# Patient Record
Sex: Female | Born: 1960 | Race: White | Hispanic: No | Smoking: Former smoker
Health system: Southern US, Community
[De-identification: ages and names within clinical notes are randomized; demographics above are authoritative.]

## PROBLEM LIST (undated history)

## (undated) DIAGNOSIS — I10 Essential (primary) hypertension: Secondary | ICD-10-CM

## (undated) DIAGNOSIS — C801 Malignant (primary) neoplasm, unspecified: Secondary | ICD-10-CM

## (undated) DIAGNOSIS — K589 Irritable bowel syndrome without diarrhea: Secondary | ICD-10-CM

## (undated) DIAGNOSIS — B1920 Unspecified viral hepatitis C without hepatic coma: Secondary | ICD-10-CM

## (undated) DIAGNOSIS — F419 Anxiety disorder, unspecified: Secondary | ICD-10-CM

## (undated) DIAGNOSIS — B1910 Unspecified viral hepatitis B without hepatic coma: Secondary | ICD-10-CM

## (undated) HISTORY — DX: Essential (primary) hypertension: I10

## (undated) HISTORY — PX: PLACEMENT OF BREAST IMPLANTS: SHX6334

## (undated) HISTORY — DX: Irritable bowel syndrome, unspecified: K58.9

## (undated) HISTORY — DX: Unspecified viral hepatitis B without hepatic coma: B19.10

## (undated) HISTORY — DX: Unspecified viral hepatitis C without hepatic coma: B19.20

## (undated) HISTORY — DX: Anxiety disorder, unspecified: F41.9

---

## 1999-03-18 ENCOUNTER — Other Ambulatory Visit: Admission: RE | Admit: 1999-03-18 | Discharge: 1999-03-18 | Payer: Self-pay | Admitting: *Deleted

## 2001-11-29 ENCOUNTER — Inpatient Hospital Stay (HOSPITAL_COMMUNITY): Admission: AD | Admit: 2001-11-29 | Discharge: 2001-11-29 | Payer: Self-pay | Admitting: Family Medicine

## 2001-11-30 ENCOUNTER — Encounter: Payer: Self-pay | Admitting: *Deleted

## 2007-01-04 HISTORY — PX: NISSEN FUNDOPLICATION: SHX2091

## 2011-01-04 HISTORY — PX: GASTRIC BYPASS: SHX52

## 2013-10-13 ENCOUNTER — Telehealth: Payer: Self-pay

## 2013-10-13 NOTE — Telephone Encounter (Signed)
PATIENT WOULD LIKE TO TALK WITH DR. Joseph Art AS SOON AS POSSIBLE. SHE HAS NOT SEEN HIM AS A PATIENT AT [facility] BEFORE, BUT SHE HAS BEEN KNOWING HIM FOR MANY MANY YEARS. HER MOTHER (THELMA Porto) IS HIS PATIENT. Trenity'S MOTHER HAD A RUPTURED COLON AND HAD TO HAVE EMERGENCY SURGERY YESTERDAY. THE PHYSICIANS DO NOT THINK SHE WILL MAKE IT MUCH LONGER. Candid IS ASKING DR. Joseph Art TO GIVE HER SOMETHING FOR HER NERVES TO CALM HER DOWN. SHE NEEDS TO TALK WITH HIM AS SOON AS POSSIBLE. SHE IS ON HER WAY TO [place] TO SEE HER MOTHER. PLEASE CALL HER BACK QUICKLY. BEST PHONE [phone] (CELL)  PHARMACY CHOICE IS [facility] [address].  [place]

## 2013-10-14 ENCOUNTER — Telehealth: Payer: Self-pay | Admitting: *Deleted

## 2013-10-14 NOTE — Telephone Encounter (Signed)
Pt mother Shaquitta Burbridge) passed away this morning. Pt will not have time to come into the office for an OV- she is asking for Klonopin to be sent to her pharmacy. She is under a huge amount of stress and feeling anxious and depressed due to the recent loss of her mother. Can this be called in for her until she is able to come to the office.

## 2013-10-14 NOTE — Telephone Encounter (Signed)
The cell phone number left is no longer in service.  If patient wants medication, she should be seen here.

## 2013-10-14 NOTE — Telephone Encounter (Signed)
Lm for pt to come in to be evaluated.

## 2013-10-14 NOTE — Telephone Encounter (Signed)
i can't prescribe a controlled substance for someone never seen here--and I didn't see her mom recently i'll forward back to dr L in case there are extenuating circumstances

## 2017-06-03 HISTORY — PX: ORIF ANKLE FRACTURE: SUR919

## 2018-06-01 ENCOUNTER — Ambulatory Visit: Payer: Self-pay | Attending: Internal Medicine | Admitting: Internal Medicine

## 2018-06-01 ENCOUNTER — Other Ambulatory Visit: Payer: Self-pay

## 2018-06-01 ENCOUNTER — Encounter: Payer: Self-pay | Admitting: Internal Medicine

## 2018-06-01 VITALS — BP 146/76 | HR 96 | Temp 98.3°F | Resp 16 | Ht 68.5 in | Wt 221.8 lb

## 2018-06-01 DIAGNOSIS — I10 Essential (primary) hypertension: Secondary | ICD-10-CM

## 2018-06-01 DIAGNOSIS — R079 Chest pain, unspecified: Secondary | ICD-10-CM

## 2018-06-01 DIAGNOSIS — R002 Palpitations: Secondary | ICD-10-CM | POA: Insufficient documentation

## 2018-06-01 DIAGNOSIS — G4700 Insomnia, unspecified: Secondary | ICD-10-CM

## 2018-06-01 DIAGNOSIS — F419 Anxiety disorder, unspecified: Secondary | ICD-10-CM | POA: Insufficient documentation

## 2018-06-01 DIAGNOSIS — K580 Irritable bowel syndrome with diarrhea: Secondary | ICD-10-CM | POA: Insufficient documentation

## 2018-06-01 DIAGNOSIS — K22719 Barrett's esophagus with dysplasia, unspecified: Secondary | ICD-10-CM

## 2018-06-01 MED ORDER — CLONAZEPAM 0.5 MG PO TABS: 0.5000 mg | ORAL_TABLET | Freq: Every day | ORAL | 0 refills | Status: DC | PRN

## 2018-06-01 MED ORDER — LOSARTAN POTASSIUM 25 MG PO TABS: 12.5000 mg | ORAL_TABLET | Freq: Every day | ORAL | 2 refills | Status: DC

## 2018-06-01 MED ORDER — HYDROCHLOROTHIAZIDE 12.5 MG PO TABS: 12.5000 mg | ORAL_TABLET | Freq: Every day | ORAL | 3 refills | Status: DC

## 2018-06-01 MED ORDER — OMEPRAZOLE 20 MG PO CPDR: 20.0000 mg | DELAYED_RELEASE_CAPSULE | Freq: Every day | ORAL | 3 refills | Status: DC

## 2018-06-01 MED FILL — ?HYDROCHLOROTHIAZIDE 25MG T: 25 | 30 days supply | Qty: 15 | Fill #0

## 2018-06-01 MED FILL — LOSARTAN POTASSIUM 25 MG TA: 25 | 30 days supply | Qty: 15 | Fill #0

## 2018-06-01 MED FILL — ?OMEPRAZOLE 20 MG CAPSULE D: 20 | 30 days supply | Qty: 30 | Fill #0

## 2018-06-01 NOTE — Progress Notes (Signed)
Pt states she has not been able to afford medications  Pt states it has been almost 9 month since she has taken these medications   Pt states she had an ablation 15 years ago   Pt states she doesn't know if she has been through menopause

## 2018-06-01 NOTE — Progress Notes (Signed)
Patient ID: Kelly Roberson, female    DOB: [birth year]  MRN: [id number]  CC: Edema (left ankle ) and New Patient (Initial Visit)   Subjective: Kelly Roberson is a 58 y.o. female who presents for new pt visit Her concerns today include:   Previous PCP was Dr. Fonnie Jarvis in [place]  Loss job and insurance in June 2019.   Moved back to [place] 03/2018.   PMHx include HTN, IBS-D, gastric ulcer, Barrett's Esophagus, hiatial hernia s/p Nissen (2009.  Reversed in 2013)  Gastric bypass in 2013, anxiety disorder, insomnia (reports being placed on Tramadol by GI to help her sleep).  Out of meds for 9-12 mths.   Checks BP 2 x a day.  Gives range 140-175/90s Limits salt in foods +edema LE x few wks.  Usually LT leg but lately in RT also.  Some redness in LT leg.  Hx of fx Lt ankle with repair, hardware in place 06/2017.  Patient reports that the left lower extremity has always been slightly larger than the right. Intermittent feeling of heart fluttering x 1 yr.  More often and intense lately.  Last few seconds and occurs 2-3 x/day -endorses CP recently.  1-2 episodes/day.  Last few mins.  Tries to calm down with meditation.  Located center of chest.  No radiation or SOB.  Usually occur at rest.  Nonsmoker.  No fhx of heart disease.  ST many yrs ago which was negative.    Anxiety:  Still an ongoing issue for her with COVID-19 going on.  She was on clonazepam in the past   Last MMG: late 2018, pap early 2018, c-scope 2018.  Past medical history, social history, family history, surgical history reviewed  Current Outpatient Medications on File Prior to Visit  Medication Sig Dispense Refill  . traMADol (ULTRAM) 50 MG tablet Take by mouth every 6 (six) hours as needed.     No current facility-administered medications on file prior to visit.     Allergies  Allergen Reactions  . Lisinopril Cough    Social History   Socioeconomic History  . Marital status: Married    Spouse name: Not on file   . Number of children: Not on file  . Years of education: Not on file  . Highest education level: Not on file  Occupational History  . Not on file  Social Needs  . Financial resource strain: Not on file  . Food insecurity:    Worry: Not on file    Inability: Not on file  . Transportation needs:    Medical: Not on file    Non-medical: Not on file  Tobacco Use  . Smoking status: Former Research scientist (life sciences)  . Smokeless tobacco: Never Used  Substance and Sexual Activity  . Alcohol use: Yes    Comment: occasionally   . Drug use: Never  . Sexual activity: Not on file  Lifestyle  . Physical activity:    Days per week: Not on file    Minutes per session: Not on file  . Stress: Not on file  Relationships  . Social connections:    Talks on phone: Not on file    Gets together: Not on file    Attends religious service: Not on file    Active member of club or organization: Not on file    Attends meetings of clubs or organizations: Not on file    Relationship status: Not on file  . Intimate partner violence:    Fear of current or  ex partner: Not on file    Emotionally abused: Not on file    Physically abused: Not on file    Forced sexual activity: Not on file  Other Topics Concern  . Not on file  Social History Narrative  . Not on file    Family History  Problem Relation Age of Onset  . Pancreatic cancer Father   . Colon cancer Maternal Aunt   . Diabetes Maternal Uncle   . Pancreatic cancer Maternal Grandfather     Past Surgical History:  Procedure Laterality Date  . GASTRIC BYPASS  2013  . NISSEN FUNDOPLICATION  7893   Reversed in 2013  . ORIF ANKLE FRACTURE Left 06/2017  . PLACEMENT OF BREAST IMPLANTS      ROS: Review of Systems Negative except as stated above  PHYSICAL EXAM: BP (!) 146/76   Pulse 96   Temp 98.3 F (36.8 C) (Oral)   Resp 16   Ht 5' 8.5" (1.74 m)   Wt 221 lb 12.8 oz (100.6 kg)   SpO2 98%   BMI 33.23 kg/m   Physical Exam  General appearance -  alert, well appearing, middle-aged Caucasian female and in no distress Mental status - normal mood, behavior, speech, dress, motor activity, and thought processes Eyes -nonicteric sclera.  Pink conjunctiva.   Nose - normal and patent, no erythema, discharge or polyps Mouth - mucous membranes moist, pharynx normal without lesions Neck - supple, no significant adenopathy Chest - clear to auscultation, no wheezes, rales or rhonchi, symmetric air entry Heart - normal rate, regular rhythm, normal S1, S2, no murmurs, rubs, clicks or gallops Abdomen - soft, nontender, nondistended, no masses or organomegaly Extremities -1+ lower extremity and ankle edema on the left, trace on the right.   Depression screen [facility] 2/9 06/01/2018  Decreased Interest 0  Down, Depressed, Hopeless 0  PHQ - 2 Score 0   GAD 7 : Generalized Anxiety Score 06/01/2018  Nervous, Anxious, on Edge 2  Control/stop worrying 1  Worry too much - different things 0  Trouble relaxing 1  Restless 0  Easily annoyed or irritable 0  Afraid - awful might happen 0  Total GAD 7 Score 4    ASSESSMENT AND PLAN: 1. Essential hypertension Not at goal. Encourage to continue limit salt in the foods. We will put her on low-dose of Cozaar with HCTZ. - Comprehensive metabolic panel; Future - Lipid panel; Future - CBC; Future  2. Palpitations Recommended getting an EKG and TSH.  However patient declines because she does not want to be billed.  She states that she would wait until she has been approved for the orange card/cone discount.  If TSH is okay plan in the future would be to have her wear a Holter monitor - TSH; Future  3. Chest pain at rest Recommended that we do an EKG today.  However patient declined for reasons stated in #2  4. Anxiety disorder, unspecified type Discussed management of anxiety.  Informed patient that I do not use benzodiazepines as a long-term treatment for anxiety due to its potential for dependence and  abuse.  I may choose to use clonazepam as a bridge when I am putting someone on an SSRI.  Normally use an SSRI like Zoloft or Lexapro or we can use BuSpar.  Patient states she is heard that Zoloft is an antidepressant.  I told her that it is indicated to treat anxiety and or depression.  Patient requested limited prescription for clonazepam and states  she will think about the SSRI.  I have given a prescription for 14 tablets of 0.5 mg of clonazepam.  Patient understands that I will not refill is a long-term prescription -[facility] reviewed  5. Insomnia, unspecified type Told patient that I do not use tramadol as a sleep aid.  I discussed good sleep hygiene with her including trying to get in bed around about the same time every night, turning off all lights and sounds once in bed, avoid drinking caffeinated beverages and excessive alcohol before bedtime.  If unable to fall asleep after being in bed for 45 minutes, I advised that she get up and do something like read until she feels sleepy  6. Irritable bowel syndrome with diarrhea 7. Barrett's esophagus with dysplasia  Patient to sign a release for me to get her records from her previous PCP. I spent 30 minutes with this patient in direct face-to-face encounter discussing diagnosis, treatment and coordinating care. Patient was given the opportunity to ask questions.  Patient verbalized understanding of the plan and was able to repeat key elements of the plan.   Orders Placed This Encounter  Procedures  . TSH  . Comprehensive metabolic panel  . Lipid panel  . CBC     Requested Prescriptions   Signed Prescriptions Disp Refills  . losartan (COZAAR) 25 MG tablet 30 tablet 2    Sig: Take 0.5 tablets (12.5 mg total) by mouth daily.  . hydrochlorothiazide (HYDRODIURIL) 12.5 MG tablet 30 tablet 3    Sig: Take 1 tablet (12.5 mg total) by mouth daily.  . clonazePAM (KLONOPIN) 0.5 MG tablet 14 tablet 0    Sig: Take 1 tablet (0.5 mg total) by mouth  daily as needed for anxiety.  Marland Kitchen omeprazole (PRILOSEC) 20 MG capsule 30 capsule 3    Sig: Take 1 capsule (20 mg total) by mouth daily.    Return in about 6 weeks (around 07/13/2018).  Karle Plumber, MD, FACP

## 2018-06-01 NOTE — Patient Instructions (Signed)
Please sign a release for [place] to get your medical records from your previous PCP  We have started you on Cozaar and hydrochlorothiazide for your blood pressure.  Once you have been approved for the orange card you can return to the lab to have your blood test done.   I have placed you on a limited prescription of clonazepam.  We will not maintain this on a long-term basis.

## 2018-06-25 MED FILL — ?HYDROCHLOROTHIAZIDE 25MG T: 25 | 30 days supply | Qty: 15 | Fill #1

## 2018-06-25 MED FILL — LOSARTAN POTASSIUM 25 MG TA: 25 | 30 days supply | Qty: 15 | Fill #1

## 2018-06-26 MED FILL — ?OMEPRAZOLE 20 MG CAPSULE D: 20 | 30 days supply | Qty: 30 | Fill #1

## 2018-07-27 MED FILL — ?HYDROCHLOROTHIAZIDE 25MG T: 25 | 30 days supply | Qty: 15 | Fill #2

## 2018-07-27 MED FILL — ?OMEPRAZOLE 20 MG CAPSULE D: 20 | 30 days supply | Qty: 30 | Fill #2

## 2018-07-27 MED FILL — LOSARTAN POTASSIUM 25 MG TA: 25 | 30 days supply | Qty: 15 | Fill #2

## 2018-09-04 MED FILL — LOSARTAN POTASSIUM 25 MG TA: 25 | 30 days supply | Qty: 15 | Fill #3

## 2018-09-04 MED FILL — ?HYDROCHLOROTHIAZIDE 25MG T: 25 | 30 days supply | Qty: 15 | Fill #3

## 2018-09-04 MED FILL — OMEPRAZOLE 20 MG CAP: 20 | 30 days supply | Qty: 30 | Fill #3

## 2018-09-26 ENCOUNTER — Telehealth: Payer: Self-pay | Admitting: Internal Medicine

## 2018-09-26 ENCOUNTER — Other Ambulatory Visit: Payer: Self-pay | Admitting: Internal Medicine

## 2018-09-26 NOTE — Telephone Encounter (Signed)
patiennt called requesting her medication dosage be increased for the hydrochlorothiazide. States she now has only 1 and a half pill left and pharmacy will not give her an additional fill until monday. Please follow up.

## 2018-09-26 NOTE — Telephone Encounter (Signed)
Patient was suppose to return in July for follow up... Patient needs to be scheduled for a visit to discuss this change and refills

## 2018-09-27 ENCOUNTER — Other Ambulatory Visit: Payer: Self-pay | Admitting: Internal Medicine

## 2018-09-27 MED FILL — LOSARTAN POTASSIUM 25 MG TA: 25 | 30 days supply | Qty: 15 | Fill #4

## 2018-09-28 MED FILL — ?OMEPRAZOLE 20MG CAP DR: 20 | 30 days supply | Qty: 30 | Fill #0

## 2018-10-12 MED FILL — ?HYDROCHLOROTHIAZIDE 25MG T: 25 | 30 days supply | Qty: 15 | Fill #4

## 2018-10-30 MED FILL — LOSARTAN POTASSIUM 25 MG TA: 25 | 30 days supply | Qty: 15 | Fill #5

## 2018-11-26 ENCOUNTER — Other Ambulatory Visit: Payer: Self-pay | Admitting: Internal Medicine

## 2018-11-28 ENCOUNTER — Other Ambulatory Visit: Payer: Self-pay | Admitting: Pharmacist

## 2018-11-28 MED ORDER — LOSARTAN POTASSIUM 25 MG PO TABS: 12.5000 mg | ORAL_TABLET | Freq: Every day | ORAL | 0 refills | Status: DC

## 2018-11-28 MED ORDER — HYDROCHLOROTHIAZIDE 12.5 MG PO TABS: 12.5000 mg | ORAL_TABLET | Freq: Every day | ORAL | 0 refills | Status: DC

## 2018-12-05 MED FILL — ?HYDROCHLOROTHIAZIDE 12.5M: 12.5 | 20 days supply | Qty: 20 | Fill #0

## 2018-12-17 ENCOUNTER — Ambulatory Visit: Payer: Medicaid Other | Attending: Internal Medicine | Admitting: Internal Medicine

## 2018-12-17 ENCOUNTER — Encounter: Payer: Self-pay | Admitting: Internal Medicine

## 2018-12-17 ENCOUNTER — Other Ambulatory Visit: Payer: Self-pay

## 2018-12-17 DIAGNOSIS — M6281 Muscle weakness (generalized): Secondary | ICD-10-CM | POA: Diagnosis not present

## 2018-12-17 DIAGNOSIS — R202 Paresthesia of skin: Secondary | ICD-10-CM

## 2018-12-17 DIAGNOSIS — R252 Cramp and spasm: Secondary | ICD-10-CM | POA: Diagnosis not present

## 2018-12-17 DIAGNOSIS — I10 Essential (primary) hypertension: Secondary | ICD-10-CM | POA: Diagnosis not present

## 2018-12-17 MED ORDER — LOSARTAN POTASSIUM 25 MG PO TABS: 25.0000 mg | ORAL_TABLET | Freq: Every day | ORAL | 1 refills | Status: DC

## 2018-12-17 MED ORDER — HYDROCHLOROTHIAZIDE 12.5 MG PO TABS: 12.5000 mg | ORAL_TABLET | Freq: Every day | ORAL | 1 refills | Status: DC

## 2018-12-17 MED FILL — LOSARTAN POTASSIUM 25 MG TA: 25 | 30 days supply | Qty: 30 | Fill #0

## 2018-12-17 NOTE — Progress Notes (Signed)
Patient verified DOB Patient has not eaten today. Patient has taken medication. Patient denies pain at this time. Patient complains of pins and needle feeling intermittently in her feet for the past year after breaking the ankle. Patient is stating losartan should be 25mg . Her BP is still elevated with the half dose.

## 2018-12-17 NOTE — Progress Notes (Signed)
Virtual Visit via Telephone Note Due to current restrictions/limitations of in-office visits due to the COVID-19 pandemic, this scheduled clinical appointment was converted to a telehealth visit  I connected with Kelly Roberson on 12/17/18 at 9:54 a.m by telephone and verified that I am speaking with the correct person using two identifiers. I am in my office.  The patient is at home.  Only the patient and myself participated in this encounter.  I discussed the limitations, risks, security and privacy concerns of performing an evaluation and management service by telephone and the availability of in person appointments. I also discussed with the patient that there may be a patient responsible charge related to this service. The patient expressed understanding and agreed to proceed.   History of Present Illness: Patient with history of HTN, anxiety disorder, insomnia, IBS, Barrett's esophagus,  HYPERTENSION Currently taking: see medication list Med Adherence: [x]  Yes    []  No Medication side effects: []  Yes    [x]  No Adherence with salt restriction: [x]  Yes    []  No Home Monitoring?: [x]  Yes    []  No Monitoring Frequency: once a day Home BP results range: 140-149/80-92 SOB? []  Yes    [x]  No Chest Pain?: []  Yes    [x]  No Leg swelling?: [x]  Yes in legs and feet.  Wears compression socks    []  No Headaches?: []  Yes    [x]  No Dizziness? []  Yes    []  No Comments:   Did not get labs done that were ordered on last visit due to limited finances.  Last visit was $500.  She has request forms for [facility] discount C/O painful pins and needles feeling in feet especially when she stands too long.  X >1 yr.  Getting worse Muscle weakness all over the body "at different times." + Muscle spasms in calfs, thighs, arms and abdomen.  Some twitching in arms.  Balance feels off at times.  Fearful of another fracture.   Outpatient Encounter Medications as of 12/17/2018  Medication Sig  . hydrochlorothiazide  (HYDRODIURIL) 12.5 MG tablet Take 1 tablet (12.5 mg total) by mouth daily. Must keep upcoming appt for more refills.  Marland Kitchen losartan (COZAAR) 25 MG tablet Take 0.5 tablets (12.5 mg total) by mouth daily. Must keep upcoming appt for more refills.  Marland Kitchen omeprazole (PRILOSEC) 20 MG capsule Take 1 capsule (20 mg total) by mouth daily. Must have office visit for refills  . clonazePAM (KLONOPIN) 0.5 MG tablet Take 1 tablet (0.5 mg total) by mouth daily as needed for anxiety. (Patient not taking: Reported on 12/17/2018)   No facility-administered encounter medications on file as of 12/17/2018.      Observations/Objective: No direct observation done as this was a telephone encounter.  Assessment and Plan: 1. Essential hypertension Not at goal of 130/80 or lower.  Increase Cozaar to 25 mg daily.  It would have been good for [place] to increase HCTZ instead given her lower extremity edema but I would like to see what her kidney function looks like and her electrolytes given her complaint of muscle cramps - losartan (COZAAR) 25 MG tablet; Take 1 tablet (25 mg total) by mouth daily. Must keep upcoming appt for more refills.  Dispense: 90 tablet; Refill: 1 - hydrochlorothiazide (HYDRODIURIL) 12.5 MG tablet; Take 1 tablet (12.5 mg total) by mouth daily. Must keep upcoming appt for more refills.  Dispense: 90 tablet; Refill: 1 - CBC; Future - Comprehensive metabolic panel; Future - Lipid panel; Future  2. Tingling of both  feet We will do an in person visit in several weeks - Vitamin B12; Future  3. Muscle cramps Check chemistry to make sure electrolytes are okay  4. Muscle weakness We will do an in person visit in several weeks - VITAMIN D 25 Hydroxy (Vit-D Deficiency, Fractures); Future  HM: Patient has not made up her mind about getting the flu shot.  If she decides to have that she will get it done when she comes to have her blood test.  Follow Up Instructions: 3-6 wks   I discussed the assessment and  treatment plan with the patient. The patient was provided an opportunity to ask questions and all were answered. The patient agreed with the plan and demonstrated an understanding of the instructions.   The patient was advised to call back or seek an in-person evaluation if the symptoms worsen or if the condition fails to improve as anticipated.  I provided 15 minutes of non-face-to-face time during this encounter.   Karle Plumber, MD

## 2018-12-18 ENCOUNTER — Other Ambulatory Visit: Payer: Self-pay | Admitting: Internal Medicine

## 2018-12-18 ENCOUNTER — Other Ambulatory Visit: Payer: Self-pay | Admitting: Pharmacist

## 2018-12-18 MED ORDER — OMEPRAZOLE 20 MG PO CPDR: 20.0000 mg | DELAYED_RELEASE_CAPSULE | Freq: Every day | ORAL | 2 refills | Status: DC

## 2018-12-18 MED FILL — HYDROCHLOROTHIAZIDE 12.5 MG: 12.5 | 30 days supply | Qty: 30 | Fill #0

## 2018-12-18 MED FILL — ?OMEPRazole 20mg CPDR: 20 | 30 days supply | Qty: 30 | Fill #0

## 2018-12-20 ENCOUNTER — Other Ambulatory Visit: Payer: Medicaid Other

## 2019-01-17 ENCOUNTER — Other Ambulatory Visit: Payer: Self-pay

## 2019-01-17 ENCOUNTER — Ambulatory Visit: Payer: Self-pay | Attending: Internal Medicine

## 2019-01-17 DIAGNOSIS — I10 Essential (primary) hypertension: Secondary | ICD-10-CM

## 2019-01-17 DIAGNOSIS — M6281 Muscle weakness (generalized): Secondary | ICD-10-CM

## 2019-01-17 DIAGNOSIS — R202 Paresthesia of skin: Secondary | ICD-10-CM

## 2019-01-18 ENCOUNTER — Encounter: Payer: Self-pay | Admitting: Internal Medicine

## 2019-01-18 ENCOUNTER — Ambulatory Visit: Payer: Medicaid Other | Attending: Internal Medicine | Admitting: Internal Medicine

## 2019-01-18 VITALS — BP 144/88 | HR 82 | Temp 98.4°F | Resp 16 | Wt 210.4 lb

## 2019-01-18 DIAGNOSIS — L659 Nonscarring hair loss, unspecified: Secondary | ICD-10-CM | POA: Diagnosis not present

## 2019-01-18 DIAGNOSIS — R5383 Other fatigue: Secondary | ICD-10-CM

## 2019-01-18 DIAGNOSIS — E782 Mixed hyperlipidemia: Secondary | ICD-10-CM | POA: Insufficient documentation

## 2019-01-18 DIAGNOSIS — G6289 Other specified polyneuropathies: Secondary | ICD-10-CM | POA: Diagnosis not present

## 2019-01-18 DIAGNOSIS — R7989 Other specified abnormal findings of blood chemistry: Secondary | ICD-10-CM

## 2019-01-18 DIAGNOSIS — M6281 Muscle weakness (generalized): Secondary | ICD-10-CM

## 2019-01-18 DIAGNOSIS — R945 Abnormal results of liver function studies: Secondary | ICD-10-CM

## 2019-01-18 DIAGNOSIS — L853 Xerosis cutis: Secondary | ICD-10-CM

## 2019-01-18 DIAGNOSIS — R739 Hyperglycemia, unspecified: Secondary | ICD-10-CM

## 2019-01-18 DIAGNOSIS — L210 Seborrhea capitis: Secondary | ICD-10-CM

## 2019-01-18 DIAGNOSIS — G629 Polyneuropathy, unspecified: Secondary | ICD-10-CM | POA: Insufficient documentation

## 2019-01-18 LAB — LIPID PANEL
Chol/HDL Ratio: 3.9 ratio (ref 0.0–4.4)
Cholesterol, Total: 232 mg/dL — ABNORMAL HIGH (ref 100–199)
HDL: 60 mg/dL (ref >39)
LDL Chol Calc (NIH): 150 mg/dL — ABNORMAL HIGH (ref 0–99)
Triglycerides: 123 mg/dL (ref 0–149)
VLDL Cholesterol Cal: 22 mg/dL (ref 5–40)

## 2019-01-18 LAB — CBC
Hematocrit: 37.1 % (ref 34.0–46.6)
Hemoglobin: 12.8 g/dL (ref 11.1–15.9)
MCH: 36 pg — ABNORMAL HIGH (ref 26.6–33.0)
MCHC: 34.5 g/dL (ref 31.5–35.7)
MCV: 104 fL — ABNORMAL HIGH (ref 79–97)
Platelets: 134 10*3/uL — ABNORMAL LOW (ref 150–450)
RBC: 3.56 x10E6/uL — ABNORMAL LOW (ref 3.77–5.28)
RDW: 14 % (ref 11.7–15.4)
WBC: 3.8 10*3/uL (ref 3.4–10.8)

## 2019-01-18 LAB — COMPREHENSIVE METABOLIC PANEL
ALT: 92 IU/L — ABNORMAL HIGH (ref 0–32)
AST: 223 IU/L — ABNORMAL HIGH (ref 0–40)
Albumin/Globulin Ratio: 1.5 (ref 1.2–2.2)
Albumin: 4.1 g/dL (ref 3.8–4.9)
Alkaline Phosphatase: 118 IU/L — ABNORMAL HIGH (ref 39–117)
BUN/Creatinine Ratio: 11 — ABNORMAL LOW (ref 12–28)
BUN: 8 mg/dL (ref 8–27)
Bilirubin Total: 1.1 mg/dL (ref 0.0–1.2)
CO2: 20 mmol/L (ref 20–29)
Calcium: 9.6 mg/dL (ref 8.7–10.3)
Chloride: 102 mmol/L (ref 96–106)
Creatinine, Ser: 0.74 mg/dL (ref 0.57–1.00)
GFR calc Af Amer: 102 mL/min/{1.73_m2} (ref >59)
GFR calc non Af Amer: 88 mL/min/{1.73_m2} (ref >59)
Globulin, Total: 2.8 g/dL (ref 1.5–4.5)
Glucose: 127 mg/dL — ABNORMAL HIGH (ref 65–99)
Potassium: 4.4 mmol/L (ref 3.5–5.2)
Sodium: 139 mmol/L (ref 134–144)
Total Protein: 6.9 g/dL (ref 6.0–8.5)

## 2019-01-18 LAB — VITAMIN D 25 HYDROXY (VIT D DEFICIENCY, FRACTURES): Vit D, 25-Hydroxy: 37.6 ng/mL (ref 30.0–100.0)

## 2019-01-18 LAB — VITAMIN B12: Vitamin B-12: 542 pg/mL (ref 232–1245)

## 2019-01-18 NOTE — Patient Instructions (Signed)
We will plan to do additional blood test today to evaluate your symptoms.  Once you have been approved for the orange card/cone discount card, I plan to refer you to a neurologist.  Try to use some over-the-counter dandruff shampoo like selenium blue or T-Gel on your scalp.  We can also plan to refer you for some physical therapy once you have been approved for the orange card/cone discount to help with gait safety and prevent falls.

## 2019-01-18 NOTE — Progress Notes (Signed)
Patient ID: Kelly Roberson, female    DOB: Sep 22, 1960  MRN: [id number]  CC: Follow-up (6 week ) and Fatigue   Subjective: Kelly Roberson is a 59 y.o. female who presents for chronic ds management Her concerns today include:  PMHx include HTN, IBS-D, gastric ulcer, Barrett's Esophagus, hiatial hernia s/p Nissen (2009.  Reversed in 2013)  Gastric bypass in 2013, anxiety disorder, insomnia (reports being placed on Tramadol by GI to help her sleep).  I went over lab results which she had done yesterday Vit D and Vit B12 level were nl. Pt states she takes Vitamins and Vit D but out x 3 wks Total and LDL cholesterol not at goal. LFTs shows elev AST/ALT and Alk phos.  Reports being told in past that she has hep C ab positive.  Had subsequent work up by [facility] almost 30 yrs ago.  Told she may had infection in past but cleared he virus. High school boyfriend developed hep C later in life and died from it.   Drinks ETOH very seldom.  CBC reveals low PLT count.  Told had fatty liver in 2013/14.    Working on getting approved for [employer].    -C/o very extremely fatigue -Still has numbness, tingling and pain in feet.  Having problems walking a while. Worse when it is cold.  Numbness also in legs from below knee down.  Soreness in muscles of thigh.  No back pain.   -[place] out of chair 3 wks ago.  She was trying to get up out of chair and her legs gave out -Urine was very orange for 3 days around christmas.  Associated with pain above pubic bone.   -c/o losing hair for 6-8 mths.  "its braking." -having intermittent blurry vision changes -has a spot on nose that came up 3-4 mths.  It has increased in size.  Thinks it may be skin cancer.  . Patient Active Problem List   Diagnosis Date Noted  . Mixed hyperlipidemia 01/18/2019  . Abnormal LFTs 01/18/2019  . Dandruff in adult 01/18/2019  . Hair loss 01/18/2019  . Muscle weakness 01/18/2019  . Peripheral neuropathy 01/18/2019  . Other  fatigue 01/18/2019  . Essential hypertension 06/01/2018  . Palpitations 06/01/2018  . Anxiety disorder 06/01/2018  . Insomnia 06/01/2018  . Irritable bowel syndrome with diarrhea 06/01/2018  . Barrett's esophagus with dysplasia 06/01/2018     Current Outpatient Medications on File Prior to Visit  Medication Sig Dispense Refill  . hydrochlorothiazide (HYDRODIURIL) 12.5 MG tablet Take 1 tablet (12.5 mg total) by mouth daily. Must keep upcoming appt for more refills. 90 tablet 1  . losartan (COZAAR) 25 MG tablet Take 1 tablet (25 mg total) by mouth daily. Must keep upcoming appt for more refills. 90 tablet 1  . omeprazole (PRILOSEC) 20 MG capsule Take 1 capsule (20 mg total) by mouth daily. 30 capsule 2   No current facility-administered medications on file prior to visit.    Allergies  Allergen Reactions  . Lisinopril Cough    Social History   Socioeconomic History  . Marital status: Married    Spouse name: Not on file  . Number of children: Not on file  . Years of education: Not on file  . Highest education level: Not on file  Occupational History  . Not on file  Tobacco Use  . Smoking status: Former Research scientist (life sciences)  . Smokeless tobacco: Never Used  Substance and Sexual Activity  . Alcohol use: Yes  Comment: occasionally   . Drug use: Never  . Sexual activity: Not Currently  Other Topics Concern  . Not on file  Social History Narrative  . Not on file   Social Determinants of Health   Financial Resource Strain:   . Difficulty of Paying Living Expenses: Not on file  Food Insecurity:   . Worried About Charity fundraiser in the Last Year: Not on file  . Ran Out of Food in the Last Year: Not on file  Transportation Needs:   . Lack of Transportation (Medical): Not on file  . Lack of Transportation (Non-Medical): Not on file  Physical Activity:   . Days of Exercise per Week: Not on file  . Minutes of Exercise per Session: Not on file  Stress:   . Feeling of Stress : Not  on file  Social Connections:   . Frequency of Communication with Friends and Family: Not on file  . Frequency of Social Gatherings with Friends and Family: Not on file  . Attends Religious Services: Not on file  . Active Member of Clubs or Organizations: Not on file  . Attends Archivist Meetings: Not on file  . Marital Status: Not on file  Intimate Partner Violence:   . Fear of Current or Ex-Partner: Not on file  . Emotionally Abused: Not on file  . Physically Abused: Not on file  . Sexually Abused: Not on file    Family History  Problem Relation Age of Onset  . Pancreatic cancer Father   . Colon cancer Maternal Aunt   . Diabetes Maternal Uncle   . Pancreatic cancer Maternal Grandfather     Past Surgical History:  Procedure Laterality Date  . GASTRIC BYPASS  2013  . NISSEN FUNDOPLICATION  4403   Reversed in 2013  . ORIF ANKLE FRACTURE Left 06/2017  . PLACEMENT OF BREAST IMPLANTS      ROS: Review of Systems Negative except as stated above  PHYSICAL EXAM: BP (!) 144/88   Pulse 82   Temp 98.4 F (36.9 C) (Oral)   Resp 16   Wt 210 lb 6.4 oz (95.4 kg)   SpO2 99%   BMI 31.53 kg/m   Wt Readings from Last 3 Encounters:  01/18/19 210 lb 6.4 oz (95.4 kg)  06/01/18 221 lb 12.8 oz (100.6 kg)    Physical Exam  General appearance - alert, well appearing, older Caucasian female and in no distress Mental status - normal mood, behavior, speech, dress, motor activity, and thought processes Eyes - pupils equal and reactive, extraocular eye movements intact Nose -patient has some easily seen blood vessels right below the skin.  Small hyperpigmented area near the tip of the nose looks like it may be an hemangioma Mouth - mucous membranes moist, pharynx normal without lesions Neck - supple, no significant adenopathy Lymphatics -no cervical or axillary lymphadenopathy.  No hepatosplenomegaly. Chest - clear to auscultation, no wheezes, rales or rhonchi, symmetric air  entry Heart - normal rate, regular rhythm, normal S1, S2, no murmurs, rubs, clicks or gallops Abdomen - soft, nontender, nondistended, no masses or organomegaly Neurological -grip: 4/5 bilaterally.  Power proximally and distally in the upper extremities 5/5.  No signs of muscle wasting in the upper extremities.  Power in the lower extremities: 5/5 bilaterally except 4/5 on flexion at the knee joint.  Power on dorsiflexion and plantarflexion of the feet: 4/5 bilaterally.  Some decreased sensation to gross touch on the outer aspect of both lower legs  right greater than left.  Leap exam normal bilaterally.  Patient noted to rock back and forth a few times and then having to push herself up with her arms to get out of the chair.  Gait appears stable. Musculoskeletal -no tenderness on palpation of the thigh and calf muscles Extremities -good popliteal, dorsalis pedis and posterior tibialis pulses Skin: She has dandruff noted on the scalp.  Hair appears thin especially in the temple area and slightly brittle.  She has a collar in the hair. Skin on the lower legs are very dry and flaky  CMP Latest Ref Rng & Units 01/17/2019  Glucose 65 - 99 mg/dL 127(H)  BUN 8 - 27 mg/dL 8  Creatinine 0.57 - 1.00 mg/dL 0.74  Sodium 134 - 144 mmol/L 139  Potassium 3.5 - 5.2 mmol/L 4.4  Chloride 96 - 106 mmol/L 102  CO2 20 - 29 mmol/L 20  Calcium 8.7 - 10.3 mg/dL 9.6  Total Protein 6.0 - 8.5 g/dL 6.9  Total Bilirubin 0.0 - 1.2 mg/dL 1.1  Alkaline Phos 39 - 117 IU/L 118(H)  AST 0 - 40 IU/L 223(H)  ALT 0 - 32 IU/L 92(H)   Lipid Panel     Component Value Date/Time   CHOL 232 (H) 01/17/2019 1055   TRIG 123 01/17/2019 1055   HDL 60 01/17/2019 1055   CHOLHDL 3.9 01/17/2019 1055   LDLCALC 150 (H) 01/17/2019 1055    CBC    Component Value Date/Time   WBC 3.8 01/17/2019 1055   RBC 3.56 (L) 01/17/2019 1055   HGB 12.8 01/17/2019 1055   HCT 37.1 01/17/2019 1055   PLT 134 (L) 01/17/2019 1055   MCV 104 (H)  01/17/2019 1055   MCH 36.0 (H) 01/17/2019 1055   MCHC 34.5 01/17/2019 1055   RDW 14.0 01/17/2019 1055    ASSESSMENT AND PLAN: 1. Other fatigue We will check a TSH especially given that she is also had some hair loss. Also recommend that she takes her multivitamin given her previous history of weight reduction surgery - TSH  2. Other polyneuropathy 3. Muscle weakness We will check a CK and aldolase level.  If these are normal, I will refer to neurology for EMG and further work-up of her constellation of symptoms.  Differential diagnosis can include myositis, myasthenia, MS etc. - Aldolase - CK  4. Hair loss Advised to stop putting color in the hair for now. - TSH  5. Dry skin dermatitis - TSH  6. Dandruff in adult Recommend using over-the-counter T-Gel or selenium blue  7. Abnormal LFTs Patient reports having had a work-up in the past when she tested positive for hepatitis C antibody.  It sounds as though her RNA quant was negative.  However, on Care everywhere, I do see that she had LFTs checked in 08/2016 and AST/ALT were not as elevated as they are now so I will screen again for chronic hep C and hepatitis B - Hepatitis C Antibody - Hepatitis c vrs RNA detect by PCR-qual - Hepatitis B surface antigen - Hepatitis B surface antibody,quantitative  8. Hyperglycemia - Hemoglobin A1c  9. Mixed hyperlipidemia Dietary and exercise counseling given.  Advised that she get approved for the orange card and [facility] discount as soon as possible.  We will have her follow-up with [place] again in 4 weeks hopefully by that time she would have been approved so that we can refer her to a neurologist. Patient was given the opportunity to ask questions.  Patient verbalized understanding  of the plan and was able to repeat key elements of the plan.   Orders Placed This Encounter  Procedures  . Aldolase  . TSH  . Hepatitis C Antibody  . Hepatitis c vrs RNA detect by PCR-qual  . Hepatitis B surface  antigen  . Hepatitis B surface antibody,quantitative  . Hemoglobin A1c  . CK     Requested Prescriptions    No prescriptions requested or ordered in this encounter    Return in about 4 weeks (around 02/15/2019).  Karle Plumber, MD, FACP

## 2019-01-21 LAB — ALDOLASE: Aldolase: 15 U/L — ABNORMAL HIGH (ref 3.3–10.3)

## 2019-01-21 LAB — TSH: TSH: 2.67 u[IU]/mL (ref 0.450–4.500)

## 2019-01-21 LAB — HEMOGLOBIN A1C
Est. average glucose Bld gHb Est-mCnc: 108 mg/dL
Hgb A1c MFr Bld: 5.4 % (ref 4.8–5.6)

## 2019-01-21 LAB — CK: Total CK: 57 U/L (ref 32–182)

## 2019-01-21 LAB — HEPATITIS C VRS RNA DETECT BY PCR-QUAL: HCV RNA NAA Qualitative: NEGATIVE

## 2019-01-21 LAB — HEPATITIS C ANTIBODY: Hep C Virus Ab: 6.3 s/co ratio — ABNORMAL HIGH (ref 0.0–0.9)

## 2019-01-21 LAB — HEPATITIS B SURFACE ANTIGEN: Hepatitis B Surface Ag: NEGATIVE

## 2019-01-21 LAB — HEPATITIS B SURFACE ANTIBODY, QUANTITATIVE: Hepatitis B Surf Ab Quant: 981.1 m[IU]/mL (ref >9.9)

## 2019-01-22 MED FILL — ?OMEPRAZOLE 20MG CAP DR: 20 | 30 days supply | Qty: 30 | Fill #1

## 2019-01-22 MED FILL — HYDROCHLOROTHIAZIDE 12.5 MG: 12.5 | 30 days supply | Qty: 30 | Fill #1

## 2019-01-22 MED FILL — LOSARTAN POTASSIUM 25 MG TA: 25 | 30 days supply | Qty: 30 | Fill #1

## 2019-02-12 ENCOUNTER — Other Ambulatory Visit: Payer: Self-pay

## 2019-02-12 ENCOUNTER — Ambulatory Visit: Payer: Medicaid Other | Attending: Internal Medicine | Admitting: Internal Medicine

## 2019-02-12 DIAGNOSIS — Z8349 Family history of other endocrine, nutritional and metabolic diseases: Secondary | ICD-10-CM | POA: Diagnosis not present

## 2019-02-12 DIAGNOSIS — R945 Abnormal results of liver function studies: Secondary | ICD-10-CM | POA: Diagnosis not present

## 2019-02-12 DIAGNOSIS — G6289 Other specified polyneuropathies: Secondary | ICD-10-CM | POA: Diagnosis not present

## 2019-02-12 DIAGNOSIS — R5383 Other fatigue: Secondary | ICD-10-CM | POA: Diagnosis not present

## 2019-02-12 DIAGNOSIS — R7989 Other specified abnormal findings of blood chemistry: Secondary | ICD-10-CM

## 2019-02-12 DIAGNOSIS — D7589 Other specified diseases of blood and blood-forming organs: Secondary | ICD-10-CM

## 2019-02-12 NOTE — Progress Notes (Signed)
Virtual Visit via Telephone Note Due to current restrictions/limitations of in-office visits due to the COVID-19 pandemic, this scheduled clinical appointment was converted to a telehealth visit  I connected with Kelly Roberson on 02/12/19 at 11:24 a.m by telephone and verified that I am speaking with the correct person using two identifiers. I am in my office.  The patient is at home.  Only the patient and myself participated in this encounter.  I discussed the limitations, risks, security and privacy concerns of performing an evaluation and management service by telephone and the availability of in person appointments. I also discussed with the patient that there may be a patient responsible charge related to this service. The patient expressed understanding and agreed to proceed.   History of Present Illness: PMHx include HTN, IBS-D, gastric ulcer, Barrett's Esophagus, hiatial hernia s/p Nissen (2009. Reversed in 2013) Gastric bypass in 2013, anxiety disorder, insomnia (reports being placed on Tramadol by GI to help her sleep).  Last evaluated 01/18/2019.  I went over lab results from last visit.  She did receive the results via MyChart. Hep C ab pos but quant RNA neg meaning either false positive or she had infection in past and cleared it.  Hep B surface ab positive and hep B surface Ag neg suggesting immunity. TSH nl.  Screen for DM negative.  Still having issues with numbness and pain in the feet.  Her history from last visit is as follows: C/o very extremely fatigue -Still has numbness, tingling and pain in feet.  Having problems walking a while. Worse when it is cold.  Numbness also in legs from below knee down.  Soreness in muscles of thigh.  No back pain.   -[place] out of chair 3 wks ago.  She was trying to get up out of chair and her legs gave out She had mild elevation in aldolase level on labs.  Plan is to refer her to the neurologist.  She is still working on trying to get approved  for the orange card/[employer] discount.  She wanted to know what her iron level is.  She reports that her mother had hemochromatosis and is wondering whether she should be screened for it given some of the symptoms that she is having.  Her CBC revealed a normal hemoglobin with elevated MCV.  Vitamin B12 level was normal.  She had elevated AST and ALT and alkaline phosphatase.   Outpatient Encounter Medications as of 02/12/2019  Medication Sig  . hydrochlorothiazide (HYDRODIURIL) 12.5 MG tablet Take 1 tablet (12.5 mg total) by mouth daily. Must keep upcoming appt for more refills.  Marland Kitchen losartan (COZAAR) 25 MG tablet Take 1 tablet (25 mg total) by mouth daily. Must keep upcoming appt for more refills.  Marland Kitchen omeprazole (PRILOSEC) 20 MG capsule Take 1 capsule (20 mg total) by mouth daily.   No facility-administered encounter medications on file as of 02/12/2019.    Observations/Objective: Results for orders placed or performed in visit on 01/18/19  Aldolase  Result Value Ref Range   Aldolase 15.0 (H) 3.3 - 10.3 U/L  TSH  Result Value Ref Range   TSH 2.670 0.450 - 4.500 uIU/mL  Hepatitis C Antibody  Result Value Ref Range   Hep C Virus Ab 6.3 (H) 0.0 - 0.9 s/co ratio  Hepatitis c vrs RNA detect by PCR-qual  Result Value Ref Range   HCV RNA NAA Qualitative Negative Negative  Hepatitis B surface antigen  Result Value Ref Range   Hepatitis B Surface Ag  Negative Negative  Hepatitis B surface antibody,quantitative  Result Value Ref Range   Hepatitis B Surf Ab Quant 981.1 Immunity>9.9 mIU/mL  Hemoglobin A1c  Result Value Ref Range   Hgb A1c MFr Bld 5.4 4.8 - 5.6 %   Est. average glucose Bld gHb Est-mCnc 108 mg/dL  CK  Result Value Ref Range   Total CK 57 32 - 182 U/L     Chemistry      Component Value Date/Time   NA 139 01/17/2019 1055   K 4.4 01/17/2019 1055   CL 102 01/17/2019 1055   CO2 20 01/17/2019 1055   BUN 8 01/17/2019 1055   CREATININE 0.74 01/17/2019 1055      Component Value  Date/Time   CALCIUM 9.6 01/17/2019 1055   ALKPHOS 118 (H) 01/17/2019 1055   AST 223 (H) 01/17/2019 1055   ALT 92 (H) 01/17/2019 1055   BILITOT 1.1 01/17/2019 1055       Assessment and Plan: 1. Other polyneuropathy Patient advised to let me know when she has been approved for the orange card/[facility] discount so that we can refer to neurology  2. Macrocytosis without anemia - Homocysteine - Folate - Methylmalonic Acid, Serum  3. Family history of hemochromatosis We will screen for hemochromatosis with a ferritin and transferrin level - Ferritin - Transferrin Saturation  4. Abnormal LFTs Plan to refer to gastroenterology once approved for the orange card/[facility] discount.  Advised to let me know when she is approved so that I can submit the referral - Hepatic function panel - US Abdomen Limited RUQ; Future   Follow Up Instructions: 3 months   I discussed the assessment and treatment plan with the patient. The patient was provided an opportunity to ask questions and all were answered. The patient agreed with the plan and demonstrated an understanding of the instructions.   The patient was advised to call back or seek an in-person evaluation if the symptoms worsen or if the condition fails to improve as anticipated.  I provided 13 minutes of non-face-to-face time during this encounter.   Karle Plumber, MD

## 2019-02-12 NOTE — Progress Notes (Signed)
Pain from feet moving up legs

## 2019-02-13 ENCOUNTER — Telehealth: Payer: Self-pay | Admitting: Internal Medicine

## 2019-02-13 NOTE — Telephone Encounter (Signed)
Patient wanted to inform pcp that she has an appointment for 03-22-2019 to see financial advisor, regarding referrals.

## 2019-02-19 ENCOUNTER — Telehealth: Payer: Self-pay | Admitting: *Deleted

## 2019-02-19 NOTE — Telephone Encounter (Signed)
Patient has Medicaid [place]/ Medicaid [place]- Family [employer]. A prior is not needed per [facility]. Patient states she would like to wait until she has the appointment in March with Financial Counselor to qualify for [facility] Benefits. She states she does not want a bill hanging over her head that she cannot afford.DICAID [place]/MEDICAID [place]-[employer] Informed patient to call our office back once she is approved for benefits so that we could  schedule the appointment. Patient verbalized understanding.

## 2019-02-25 ENCOUNTER — Telehealth: Payer: Self-pay | Admitting: Internal Medicine

## 2019-02-25 MED FILL — HYDROCHLOROTHIAZIDE 12.5 MG: 12.5 | 30 days supply | Qty: 30 | Fill #2

## 2019-02-25 MED FILL — LOSARTAN POTASSIUM 25 MG TA: 25 | 30 days supply | Qty: 30 | Fill #2

## 2019-02-25 MED FILL — ?OMEPRAZOLE 20MG CAP DR: 20 | 30 days supply | Qty: 30 | Fill #2

## 2019-02-25 NOTE — Telephone Encounter (Signed)
Patient called and requested to inform pcp that her financial assistance appointment was moved up to 03/11/2019, therefore her referrals could start being placed. Please follow up at your earliest convenience.

## 2019-02-25 NOTE — Telephone Encounter (Signed)
Returned pt call and made aware that we will have to wait till she is approve for the financial before we are to place referrals because some practices will look for the financial letter in the system and if they are not able to find it they will close the referral. Pt states she understated

## 2019-03-11 ENCOUNTER — Ambulatory Visit: Payer: Self-pay | Attending: Internal Medicine

## 2019-03-11 ENCOUNTER — Other Ambulatory Visit: Payer: Self-pay

## 2019-03-15 ENCOUNTER — Telehealth: Payer: Self-pay | Admitting: Internal Medicine

## 2019-03-15 DIAGNOSIS — R945 Abnormal results of liver function studies: Secondary | ICD-10-CM

## 2019-03-15 DIAGNOSIS — R7989 Other specified abnormal findings of blood chemistry: Secondary | ICD-10-CM

## 2019-03-15 DIAGNOSIS — G6289 Other specified polyneuropathies: Secondary | ICD-10-CM

## 2019-03-15 NOTE — Telephone Encounter (Signed)
Patient called saying she saw Clifton James and has faxed over all her financial paperwork yesterday 03/14/19 . Patient states that she was told by Clifton James to go ahead and let her PCP know to put in the referrals she needs.

## 2019-03-15 NOTE — Telephone Encounter (Signed)
Will forward to pcp

## 2019-03-18 ENCOUNTER — Encounter: Payer: Self-pay | Admitting: Neurology

## 2019-03-18 NOTE — Telephone Encounter (Signed)
Patient has an appointment with  [facility]  05-09-2019 @ 1:50pm

## 2019-03-20 ENCOUNTER — Telehealth: Payer: Self-pay | Admitting: Internal Medicine

## 2019-03-20 NOTE — Telephone Encounter (Signed)
Contacted the scheduling department and spoke to [place].   Pt is schedule for March 22, 2019 at 11:30am at [facility]. Pt is to arrive at 11:15am. Pt will need to be NPO after midnight   Contacted pt to go over appointment information for [place]. Pt is aware and doesn't have any questions or concerns

## 2019-03-20 NOTE — Telephone Encounter (Signed)
Approved for orange card financial assistance and can now get the ultra sounds, endoscopy, ekg and any other test done.

## 2019-03-22 ENCOUNTER — Ambulatory Visit: Payer: Medicaid Other

## 2019-03-22 ENCOUNTER — Telehealth: Payer: Self-pay | Admitting: Physician Assistant

## 2019-03-22 ENCOUNTER — Encounter: Payer: Self-pay | Admitting: Physician Assistant

## 2019-03-22 ENCOUNTER — Ambulatory Visit (HOSPITAL_COMMUNITY): Payer: Self-pay

## 2019-03-22 NOTE — Telephone Encounter (Signed)
.  A user error has taken place: error

## 2019-03-25 ENCOUNTER — Encounter: Payer: Self-pay | Admitting: Internal Medicine

## 2019-03-28 ENCOUNTER — Other Ambulatory Visit: Payer: Self-pay | Admitting: Internal Medicine

## 2019-03-28 MED FILL — ?OMEPRAZOLE 20 MG CAP DR: 20 | 30 days supply | Qty: 30 | Fill #0

## 2019-03-28 MED FILL — ?LOSARTAN POTASSIUM 25MG: 25 | 30 days supply | Qty: 30 | Fill #3

## 2019-03-28 MED FILL — HYDROCHLOROTHIAZIDE 12.5 MG: 12.5 | 30 days supply | Qty: 30 | Fill #3

## 2019-04-15 ENCOUNTER — Other Ambulatory Visit: Payer: Self-pay

## 2019-04-15 ENCOUNTER — Ambulatory Visit (HOSPITAL_COMMUNITY)
Admission: RE | Admit: 2019-04-15 | Discharge: 2019-04-15 | Disposition: A | Discharge status: Home / Self Care | Payer: MEDICAID | Source: Ambulatory Visit | Attending: Internal Medicine | Admitting: Internal Medicine

## 2019-04-15 DIAGNOSIS — R945 Abnormal results of liver function studies: Secondary | ICD-10-CM | POA: Insufficient documentation

## 2019-04-15 DIAGNOSIS — R7989 Other specified abnormal findings of blood chemistry: Secondary | ICD-10-CM

## 2019-04-16 ENCOUNTER — Ambulatory Visit: Payer: Medicaid Other | Admitting: Physician Assistant

## 2019-04-16 ENCOUNTER — Encounter: Payer: Self-pay | Admitting: Internal Medicine

## 2019-04-17 NOTE — Telephone Encounter (Signed)
Phone call placed to patient today to go over the results of the liver ultrasound that was done.  This revealed that she has 2 liver lesions one in the right lobe and the other in the left lobe.  The largest one is 3.1 cm in size in the left lobe.  Radiologist suggested that the most likely hemangiomas in the absence of history of malignancy.  He recommends follow-up ultrasound in 6 months to ensure stability.  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  I was able to look in care everywhere and see that she had a CAT scan of the abdomen in 2018 and mention is made of liver lesions the largest of which was 2.6 x 2 cm in the right lobe. Advised patient that they are most likely hemangiomas but I would like to get the gastroenterologist opinion as to whether we need to do additional imaging like an MRI.  She has an appointment coming up with the gastroenterologist later this month.  Patient mentioned that she is still gets very fatigued when she tries to do anything.  She has an appointment coming up with me on the 11th of next month.  If we do have any cancellation we will try to get her in sooner.

## 2019-04-25 MED FILL — LOSARTAN POTASSIUM 25 MG TA: 25 | 30 days supply | Qty: 30 | Fill #4

## 2019-04-25 MED FILL — HYDROCHLOROTHIAZIDE 12.5 MG: 12.5 | 30 days supply | Qty: 30 | Fill #4

## 2019-04-25 MED FILL — ?OMEPRAZOLE 20 MG CAP DR: 20 | 30 days supply | Qty: 30 | Fill #1

## 2019-04-26 ENCOUNTER — Ambulatory Visit: Payer: Medicaid Other | Admitting: Gastroenterology

## 2019-04-30 ENCOUNTER — Other Ambulatory Visit: Payer: Self-pay

## 2019-04-30 ENCOUNTER — Inpatient Hospital Stay (HOSPITAL_COMMUNITY)
Admission: EM | Admit: 2019-04-30 | Discharge: 2019-05-01 | DRG: 836 | Disposition: A | Discharge status: Other Acute Inpatient Hospital | Payer: Medicaid Other | Attending: Internal Medicine | Admitting: Internal Medicine

## 2019-04-30 ENCOUNTER — Encounter (HOSPITAL_COMMUNITY): Payer: Self-pay | Admitting: Internal Medicine

## 2019-04-30 ENCOUNTER — Encounter: Payer: Self-pay | Admitting: Gastroenterology

## 2019-04-30 ENCOUNTER — Other Ambulatory Visit (INDEPENDENT_AMBULATORY_CARE_PROVIDER_SITE_OTHER): Payer: Medicaid Other

## 2019-04-30 ENCOUNTER — Ambulatory Visit (INDEPENDENT_AMBULATORY_CARE_PROVIDER_SITE_OTHER): Payer: Self-pay | Admitting: Gastroenterology

## 2019-04-30 VITALS — BP 128/70 | HR 90 | Temp 98.3°F | Ht 68.0 in | Wt 213.6 lb

## 2019-04-30 DIAGNOSIS — Z87891 Personal history of nicotine dependence: Secondary | ICD-10-CM | POA: Diagnosis not present

## 2019-04-30 DIAGNOSIS — D509 Iron deficiency anemia, unspecified: Secondary | ICD-10-CM | POA: Diagnosis present

## 2019-04-30 DIAGNOSIS — R7989 Other specified abnormal findings of blood chemistry: Secondary | ICD-10-CM

## 2019-04-30 DIAGNOSIS — K22719 Barrett's esophagus with dysplasia, unspecified: Secondary | ICD-10-CM | POA: Diagnosis present

## 2019-04-30 DIAGNOSIS — K589 Irritable bowel syndrome without diarrhea: Secondary | ICD-10-CM | POA: Diagnosis present

## 2019-04-30 DIAGNOSIS — Z79899 Other long term (current) drug therapy: Secondary | ICD-10-CM

## 2019-04-30 DIAGNOSIS — K219 Gastro-esophageal reflux disease without esophagitis: Secondary | ICD-10-CM | POA: Diagnosis present

## 2019-04-30 DIAGNOSIS — R945 Abnormal results of liver function studies: Secondary | ICD-10-CM

## 2019-04-30 DIAGNOSIS — M542 Cervicalgia: Secondary | ICD-10-CM | POA: Diagnosis present

## 2019-04-30 DIAGNOSIS — R202 Paresthesia of skin: Secondary | ICD-10-CM | POA: Diagnosis present

## 2019-04-30 DIAGNOSIS — D539 Nutritional anemia, unspecified: Secondary | ICD-10-CM | POA: Diagnosis present

## 2019-04-30 DIAGNOSIS — D696 Thrombocytopenia, unspecified: Secondary | ICD-10-CM | POA: Diagnosis present

## 2019-04-30 DIAGNOSIS — K2270 Barrett's esophagus without dysplasia: Secondary | ICD-10-CM

## 2019-04-30 DIAGNOSIS — Z20822 Contact with and (suspected) exposure to covid-19: Secondary | ICD-10-CM | POA: Diagnosis present

## 2019-04-30 DIAGNOSIS — D649 Anemia, unspecified: Secondary | ICD-10-CM

## 2019-04-30 DIAGNOSIS — C9500 Acute leukemia of unspecified cell type not having achieved remission: Secondary | ICD-10-CM | POA: Diagnosis present

## 2019-04-30 DIAGNOSIS — C9200 Acute myeloblastic leukemia, not having achieved remission: Secondary | ICD-10-CM | POA: Diagnosis present

## 2019-04-30 DIAGNOSIS — D7589 Other specified diseases of blood and blood-forming organs: Secondary | ICD-10-CM | POA: Diagnosis present

## 2019-04-30 DIAGNOSIS — F419 Anxiety disorder, unspecified: Secondary | ICD-10-CM | POA: Diagnosis present

## 2019-04-30 DIAGNOSIS — Z888 Allergy status to other drugs, medicaments and biological substances status: Secondary | ICD-10-CM | POA: Diagnosis not present

## 2019-04-30 DIAGNOSIS — D72820 Lymphocytosis (symptomatic): Secondary | ICD-10-CM

## 2019-04-30 DIAGNOSIS — I10 Essential (primary) hypertension: Secondary | ICD-10-CM | POA: Diagnosis present

## 2019-04-30 DIAGNOSIS — G629 Polyneuropathy, unspecified: Secondary | ICD-10-CM | POA: Diagnosis present

## 2019-04-30 DIAGNOSIS — Z9884 Bariatric surgery status: Secondary | ICD-10-CM

## 2019-04-30 DIAGNOSIS — Z9882 Breast implant status: Secondary | ICD-10-CM

## 2019-04-30 DIAGNOSIS — D72829 Elevated white blood cell count, unspecified: Secondary | ICD-10-CM | POA: Diagnosis present

## 2019-04-30 DIAGNOSIS — E782 Mixed hyperlipidemia: Secondary | ICD-10-CM | POA: Diagnosis present

## 2019-04-30 DIAGNOSIS — K580 Irritable bowel syndrome with diarrhea: Secondary | ICD-10-CM

## 2019-04-30 DIAGNOSIS — R933 Abnormal findings on diagnostic imaging of other parts of digestive tract: Secondary | ICD-10-CM

## 2019-04-30 DIAGNOSIS — R1011 Right upper quadrant pain: Secondary | ICD-10-CM

## 2019-04-30 LAB — IBC + FERRITIN
Ferritin: >1500 ng/mL — ABNORMAL HIGH (ref 10.0–291.0)
Iron: 263 ug/dL — ABNORMAL HIGH (ref 42–145)
Saturation Ratios: 86.2 % — ABNORMAL HIGH (ref 20.0–50.0)
Transferrin: 218 mg/dL (ref 212.0–360.0)

## 2019-04-30 LAB — COMPREHENSIVE METABOLIC PANEL
ALT: 24 U/L (ref 0–35)
ALT: 29 U/L (ref 0–44)
AST: 21 U/L (ref 0–37)
AST: 29 U/L (ref 15–41)
Albumin: 4.1 g/dL (ref 3.5–5.2)
Albumin: 3.9 g/dL (ref 3.5–5.0)
Alkaline Phosphatase: 73 U/L (ref 39–117)
Alkaline Phosphatase: 70 U/L (ref 38–126)
Anion gap: 11 (ref 5–15)
BUN: 10 mg/dL (ref 6–23)
BUN: 11 mg/dL (ref 6–20)
CO2: 23 mEq/L (ref 19–32)
CO2: 18 mmol/L — ABNORMAL LOW (ref 22–32)
Calcium: 9.1 mg/dL (ref 8.4–10.5)
Calcium: 8.7 mg/dL — ABNORMAL LOW (ref 8.9–10.3)
Chloride: 104 mEq/L (ref 96–112)
Chloride: 106 mmol/L (ref 98–111)
Creatinine, Ser: 0.57 mg/dL (ref 0.40–1.20)
Creatinine, Ser: 0.6 mg/dL (ref 0.44–1.00)
GFR calc Af Amer: >60 mL/min (ref >60)
GFR calc non Af Amer: >60 mL/min (ref >60)
GFR: 108.07 mL/min (ref >60.00)
Glucose, Bld: 106 mg/dL — ABNORMAL HIGH (ref 70–99)
Glucose, Bld: 122 mg/dL — ABNORMAL HIGH (ref 70–99)
Potassium: 3.6 mEq/L (ref 3.5–5.1)
Potassium: 3.9 mmol/L (ref 3.5–5.1)
Sodium: 136 mEq/L (ref 135–145)
Sodium: 135 mmol/L (ref 135–145)
Total Bilirubin: 0.9 mg/dL (ref 0.2–1.2)
Total Bilirubin: 0.5 mg/dL (ref 0.3–1.2)
Total Protein: 7.1 g/dL (ref 6.0–8.3)
Total Protein: 7.3 g/dL (ref 6.5–8.1)

## 2019-04-30 LAB — CBC
HCT: 16.1 % — CL (ref 36.0–46.0)
HCT: 16.6 % — ABNORMAL LOW (ref 36.0–46.0)
Hemoglobin: 5.5 g/dL — CL (ref 12.0–15.0)
Hemoglobin: 5.2 g/dL — CL (ref 12.0–15.0)
MCH: 41.6 pg — ABNORMAL HIGH (ref 26.0–34.0)
MCHC: 34 g/dL (ref 30.0–36.0)
MCHC: 31.3 g/dL (ref 30.0–36.0)
MCV: 123.7 fl — ABNORMAL HIGH (ref 78.0–100.0)
MCV: 132.8 fL — ABNORMAL HIGH (ref 80.0–100.0)
Platelets: 19 10*3/uL — CL (ref 150.0–400.0)
Platelets: 12 10*3/uL — CL (ref 150–400)
RBC: 1.3 Mil/uL — ABNORMAL LOW (ref 3.87–5.11)
RBC: 1.25 MIL/uL — ABNORMAL LOW (ref 3.87–5.11)
RDW: 16.7 % — ABNORMAL HIGH (ref 11.5–15.5)
RDW: 15.8 % — ABNORMAL HIGH (ref 11.5–15.5)
WBC: 21.7 10*3/uL (ref 4.0–10.5)
WBC: 29.1 10*3/uL — ABNORMAL HIGH (ref 4.0–10.5)
nRBC: 0.1 % (ref 0.0–0.2)

## 2019-04-30 LAB — POC OCCULT BLOOD, ED: Fecal Occult Bld: NEGATIVE

## 2019-04-30 LAB — HIGH SENSITIVITY CRP: CRP, High Sensitivity: 7.31 mg/L — ABNORMAL HIGH (ref 0.000–5.000)

## 2019-04-30 LAB — RESPIRATORY PANEL BY RT PCR (FLU A&B, COVID)
Influenza A by PCR: NEGATIVE
Influenza B by PCR: NEGATIVE
SARS Coronavirus 2 by RT PCR: NEGATIVE

## 2019-04-30 LAB — SEDIMENTATION RATE: Sed Rate: 36 mm/hr — ABNORMAL HIGH (ref 0–30)

## 2019-04-30 LAB — PROTIME-INR
INR: 1.3 ratio — ABNORMAL HIGH (ref 0.8–1.0)
Prothrombin Time: 14.3 s — ABNORMAL HIGH (ref 9.6–13.1)

## 2019-04-30 LAB — PREPARE RBC (CROSSMATCH)

## 2019-04-30 MED ORDER — SODIUM CHLORIDE 0.9 % IV SOLN
10.0000 mL/h | Freq: Once | INTRAVENOUS | Status: AC
  Administered 2019-04-30: 10 mL/h via INTRAVENOUS

## 2019-04-30 MED ORDER — SODIUM CHLORIDE 0.45 % IV SOLN: INTRAVENOUS | Status: DC

## 2019-04-30 MED ORDER — ONDANSETRON HCL 4 MG/2ML IJ SOLN: 4.0000 mg | Freq: Four times a day (QID) | INTRAMUSCULAR | Status: DC | PRN

## 2019-04-30 MED ORDER — LORAZEPAM 1 MG PO TABS
1.0000 mg | ORAL_TABLET | Freq: Four times a day (QID) | ORAL | Status: DC | PRN
  Administered 2019-04-30 – 2019-05-01 (×2): 1 mg via ORAL
  Filled 2019-04-30 (×3): qty 1

## 2019-04-30 MED ORDER — ONDANSETRON HCL 4 MG PO TABS: 4.0000 mg | ORAL_TABLET | Freq: Four times a day (QID) | ORAL | Status: DC | PRN

## 2019-04-30 MED ORDER — PANTOPRAZOLE SODIUM 40 MG PO TBEC
40.0000 mg | DELAYED_RELEASE_TABLET | Freq: Every day | ORAL | Status: DC
  Administered 2019-05-01: 11:00:00 40 mg via ORAL
  Filled 2019-04-30: qty 1

## 2019-04-30 MED ORDER — LOSARTAN POTASSIUM 50 MG PO TABS
25.0000 mg | ORAL_TABLET | Freq: Every day | ORAL | Status: DC
  Administered 2019-05-01: 11:00:00 25 mg via ORAL
  Filled 2019-04-30: qty 1

## 2019-04-30 NOTE — Progress Notes (Addendum)
[facility] consult note  Chief Complaint: Elevated LFTs  Subjective  History:  This is a pleasant 59 year old woman referred by primary care when she establish care with them in January.  She moved here from [facility], but had not had care there in a couple of years since she was laid off in 2018.  From a GI standpoint, she had a previous fundoplication for reflux but then a takedown of that surgery and apparently gastric bypass with that revision. She reports Barrett's esophagus, and says it has been at least a few years since her last upper endoscopy.  Her last colonoscopy was perhaps 5 years ago, none of those reports are yet available.  She is bothered by profound fatigue, leg and muscle cramps, foot numbness and pain.  She says she can just not do very much activity, even walking around the house makes her tired.  Recent primary care notes reviewed.  Plan was to refer patient to neurology for her leg and foot pain and numbness with elevated aldolase level.  Ricarda has chronic diarrhea from a history of IBS, denies rectal bleeding. She has some upper abdominal pain lately more toward the left, and is difficult to characterize.  ROS: Pain in tingling with numbness in her feet.  Pain and weakness in muscles especially of legs.  (See recent primary care notes) Severe fatigue, and getting progressively worse in the last couple of months. Swelling and stiffness in the hands, especially in the morning. No rash She is losing her hair and eyebrows  Remainder of systems negative except as above  Past Medical History:  Diagnosis Date  . Anxiety   . Hypertension   . IBS (irritable bowel syndrome)    Past Surgical History:  Procedure Laterality Date  . GASTRIC BYPASS  2013  . NISSEN FUNDOPLICATION  8280   Reversed in 2013  . ORIF ANKLE FRACTURE Left 06/2017  . PLACEMENT OF BREAST IMPLANTS     Family History  Problem Relation Age of Onset  . Pancreatic  cancer Father   . Colon cancer Maternal Aunt   . Diabetes Maternal Uncle   . Pancreatic cancer Maternal Grandfather    Patient believes her mother had hemochromatosis, or at least "high iron" when diagnosed with non-alcoholic cirrhosis  Objective:  Med list reviewed  Current Outpatient Medications:  .  hydrochlorothiazide (HYDRODIURIL) 12.5 MG tablet, Take 1 tablet (12.5 mg total) by mouth daily. Must keep upcoming appt for more refills., Disp: 90 tablet, Rfl: 1 .  losartan (COZAAR) 25 MG tablet, Take 1 tablet (25 mg total) by mouth daily. Must keep upcoming appt for more refills., Disp: 90 tablet, Rfl: 1 .  omeprazole (PRILOSEC) 20 MG capsule, TAKE 1 CAPSULE (20 MG TOTAL) BY MOUTH DAILY., Disp: 30 capsule, Rfl: 2   Vital signs in last 24 hrs: Vitals:   04/30/19 1318  BP: 128/70  Pulse: 90  Temp: 98.3 F (36.8 C)  SpO2: 99%    Physical Exam  She is pale and looks generally unwell.  Lateral thinning of eyebrows  HEENT: sclera anicteric, oral mucosa moist without lesions.  Scleral pallor  Neck: supple, no thyromegaly, JVD or lymphadenopathy  Cardiac: RRR without murmurs, S1S2 heard, no peripheral edema  Pulm: clear to auscultation bilaterally, normal RR and effort noted  Abdomen: soft, midline upper scar with tenderness, with active bowel sounds. No guarding or palpable hepatosplenomegaly.  Skin; pale, warm and dry, no jaundice or rash.  No spider nevi No  visible swelling of hands.  She has some trace pretibial and ankle edema, left greater than right.  (Previous surgery in the left ankle from a fracture) Labs:  CMP Latest Ref Rng & Units 01/17/2019  Glucose 65 - 99 mg/dL 127(H)  BUN 8 - 27 mg/dL 8  Creatinine 0.57 - 1.00 mg/dL 0.74  Sodium 134 - 144 mmol/L 139  Potassium 3.5 - 5.2 mmol/L 4.4  Chloride 96 - 106 mmol/L 102  CO2 20 - 29 mmol/L 20  Calcium 8.7 - 10.3 mg/dL 9.6  Total Protein 6.0 - 8.5 g/dL 6.9  Total Bilirubin 0.0 - 1.2 mg/dL 1.1  Alkaline Phos 39 -  117 IU/L 118(H)  AST 0 - 40 IU/L 223(H)  ALT 0 - 32 IU/L 92(H)   CBC Latest Ref Rng & Units 01/17/2019  WBC 3.4 - 10.8 x10E3/uL 3.8  Hemoglobin 11.1 - 15.9 g/dL 12.8  Hematocrit 34.0 - 46.6 % 37.1  Platelets 150 - 450 x10E3/uL 134(L)   Hemoglobin A1c 5.4, TSH 2.7  CK normal at 57, aldolase elevated at 15 (normal up to 10)  January 2021: Hepatitis B surface antigen negative, surface antibody positive, hepatitis C virus antibody positive, but negative viral load  No iron levels on file ___________________________________________ Radiologic studies:  CLINICAL DATA:  Abnormal liver function test.   EXAM: ULTRASOUND ABDOMEN LIMITED RIGHT UPPER QUADRANT   COMPARISON:  None.   FINDINGS: Gallbladder:   No gallstones or wall thickening visualized. No sonographic Murphy sign noted by sonographer.   Common bile duct:   Diameter: 5 mm which is within normal limits.   Liver:   Two rounded echogenic foci are noted, with 3.1 cm abnormality seen in left hepatic lobe and 2.7 cm abnormality in right hepatic lobe. Within normal limits in parenchymal echogenicity. Portal vein is patent on color Doppler imaging with normal direction of blood flow towards the liver.   Other: None.   IMPRESSION: Two rounded echogenic foci are noted in the liver, the largest measuring 3.1 cm in left hepatic lobe. 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 If the patient does have a history of malignancy, further evaluation with MRI with and without gadolinium is recommended evaluate for possible metastatic disease.     Electronically Signed   By: Marijo Conception M.D.   On: 04/15/2019 15:45  ____________________________________________ Other:   _____________________________________________ Assessment & Plan  Assessment: Encounter Diagnoses  Name Primary?  Marland Kitchen LFTs abnormal Yes    . Barrett's esophagus determined by biopsy   . Referred right upper quadrant abdominal pain   . Irritable bowel syndrome with diarrhea   . Abnormal finding on GI tract imaging     Significant elevated LFTs with an AST predominant transaminitis.  Given her other symptoms and lab findings, I am concerned she might have a primary rheumatologic disorder such as PMR. She has a history of Barrett's esophagus and IBS, history of which can be better understood when we get some prior records. Her last labs were several months ago.  Low platelets of unclear cause at that time. Difficult to know exactly what her mother had.  Not clear if it was definitely hemochromatosis, could have been secondary iron overload from cirrhosis of another cause.  Nevertheless, we should certainly check iron levels with her other lab work-up today. Most likely benign liver lesions based on radiologist's appearance.  May need further imaging such as  CT or MRI pending results of liver work-up labs  Plan: Repeat hepatitis panel, CBC, autoimmune and metabolic liver disease labs. ESR and CRP as well. Record request form was signed, will be sent to her GI doctor in [place].  Appropriate timing of EGD and/or colonoscopy can then be determined.  Nelida Meuse III  __________________________________ Addendum 3:25 PM  Call from our laboratory with urgent results:  WBC 21.7, hemoglobin 5.5, hematocrit 16.1, platelets 19  When confirmed, will contact patient and refer to ED.  Madelon Lips MD ____________________________________  Addendum 5:10 PM:  Patient's labs resulted with multiple abnormalities. C-reactive protein elevated, ESR 36 iron levels markedly elevated with 86% saturation and ferritin over 1500.  The hematologic abnormalities above were confirmed on repeat testing.  INR mildly elevated at 1.3  Surprisingly, LFTs have returned to normal.  With these results, it would seem the patient has a primary  hematologic condition.  She was contacted by my medical assistant about an hour ago and instructed to present to either the [facility] or [facility] emergency department.  She is severely anemic and thrombocytopenic and needs medical admission. I spoke with Dr. Vanita Panda in the [facility] ED to update him on why the patient was referred there.  Wilfrid Lund, MD

## 2019-04-30 NOTE — ED Triage Notes (Signed)
Patient states she went to GI doctor and bloodwork was done. GI called patient and told patient to come to ED for Hgb of 5. Patient has never had transfusion

## 2019-04-30 NOTE — Patient Instructions (Signed)
If you are age 61 or older, your body mass index should be between 23-30. Your Body mass index is 32.48 kg/m. If this is out of the aforementioned range listed, please consider follow up with your Primary Care Provider.  If you are age 92 or younger, your body mass index should be between 19-25. Your Body mass index is 32.48 kg/m. If this is out of the aformentioned range listed, please consider follow up with your Primary Care Provider.   Your provider has requested that you go to the basement level for lab work before leaving today. Press "B" on the elevator. The lab is located at the first door on the left as you exit the elevator.  Due to recent changes in healthcare laws, you may see the results of your imaging and laboratory studies on MyChart before your provider has had a chance to review them.  We understand that in some cases there may be results that are confusing or concerning to you. Not all laboratory results come back in the same time frame and the provider may be waiting for multiple results in order to interpret others.  Please give [place] 48 hours in order for your provider to thoroughly review all the results before contacting the office for clarification of your results.    It was a pleasure to see you today!  Dr. Loletha Carrow

## 2019-04-30 NOTE — ED Provider Notes (Signed)
[facility] DEPT Provider Note   CSN: [id number] Arrival date & time: 04/30/19  1639     History Chief Complaint  Patient presents with  . low HGB    Kelly Roberson is a 59 y.o. female.  HPI    Patient with multiple medical issues including IBS, now status post reversal of Nissen fundoplication presents with concern of weakness, numbness and tingling in her distal extremities. She notes that she has been symptomatic for years, but now over the past weeks, possibly week she has been progressively more weak in general.  No new syncope, fall.  No diarrhea, no vomiting.  No focal pain, no confusion. She notes that she has been evaluated by multiple physicians in multiple areas as she transitions from [place] to this area though she was born here. With an episode of upper abdominal pain a few weeks ago she had ultrasound performed, labs performed.  These were notable for abnormal LFTs.  She was referred to gastroenterology.  Yesterday she had blood work performed and today showed to the visit to discuss results.  On review seems that the patient had multiple abnormalities concerning for anemia among other things and she was sent here. Prior to my evaluation of the patient I discussed her presentation with the gastroenterologist who saw and evaluated the patient.  As above, with concern for anemia as well as other abnormalities she presents for eval. During the conversation we reviewed the patient's outpatient lab findings, notable for thrombocytopenia, anemia.  Rest reviewed the patient's outpatient right upper quadrant ultrasound from 2 weeks ago, which did have some nonspecific liver abnormalities, but no evidence for acute hepatobiliary dysfunction.  Per GI patient can follow this electively with additional studies  Past Medical History:  Diagnosis Date  . Anxiety   . Hypertension   . IBS (irritable bowel syndrome)     Patient Active Problem List   Diagnosis Date Noted  . Macrocytosis without anemia 02/12/2019  . Family history of hemochromatosis 02/12/2019  . Mixed hyperlipidemia 01/18/2019  . Abnormal LFTs 01/18/2019  . Dandruff in adult 01/18/2019  . Hair loss 01/18/2019  . Muscle weakness 01/18/2019  . Peripheral neuropathy 01/18/2019  . Other fatigue 01/18/2019  . Essential hypertension 06/01/2018  . Palpitations 06/01/2018  . Anxiety disorder 06/01/2018  . Insomnia 06/01/2018  . Irritable bowel syndrome with diarrhea 06/01/2018  . Barrett's esophagus with dysplasia 06/01/2018    Past Surgical History:  Procedure Laterality Date  . GASTRIC BYPASS  2013  . NISSEN FUNDOPLICATION  [id number]   Reversed in 2013  . ORIF ANKLE FRACTURE Left 06/2017  . PLACEMENT OF BREAST IMPLANTS       OB History   No obstetric history on file.     Family History  Problem Relation Age of Onset  . Pancreatic cancer Father   . Colon cancer Maternal Aunt   . Diabetes Maternal Uncle   . Pancreatic cancer Maternal Grandfather     Social History   Tobacco Use  . Smoking status: Former Research scientist (life sciences)  . Smokeless tobacco: Never Used  Substance Use Topics  . Alcohol use: Yes    Comment: occasionally   . Drug use: Never    Home Medications Prior to Admission medications   Medication Sig Start Date End Date Taking? Authorizing Provider  hydrochlorothiazide (HYDRODIURIL) 12.5 MG tablet Take 1 tablet (12.5 mg total) by mouth daily. Must keep upcoming appt for more refills. 12/17/18   Ladell Pier, MD  losartan (COZAAR) 25  MG tablet Take 1 tablet (25 mg total) by mouth daily. Must keep upcoming appt for more refills. 12/17/18   Ladell Pier, MD  omeprazole (PRILOSEC) 20 MG capsule TAKE 1 CAPSULE (20 MG TOTAL) BY MOUTH DAILY. 03/28/19   Ladell Pier, MD    Allergies    Lisinopril  Review of Systems   Review of Systems  Constitutional:       Per HPI, otherwise negative  HENT:       Per HPI, otherwise negative  Respiratory:        Per HPI, otherwise negative  Cardiovascular:       Per HPI, otherwise negative  Gastrointestinal: Positive for nausea. Negative for abdominal pain and vomiting.  Endocrine:       Negative aside from HPI  Genitourinary:       Neg aside from HPI   Musculoskeletal:       Per HPI, otherwise negative  Skin: Negative.   Neurological: Positive for weakness, light-headedness and numbness. Negative for seizures and syncope.    Physical Exam Updated Vital Signs BP 133/66   Pulse 96   Temp 97.6 F (36.4 C) (Oral)   Resp 18   SpO2 100%   Physical Exam Vitals and nursing note reviewed.  Constitutional:      General: She is not in acute distress.    Appearance: She is well-developed. She is obese.  HENT:     Head: Normocephalic and atraumatic.  Eyes:     Conjunctiva/sclera: Conjunctivae normal.  Cardiovascular:     Rate and Rhythm: Normal rate and regular rhythm.  Pulmonary:     Effort: Pulmonary effort is normal. No respiratory distress.     Breath sounds: Normal breath sounds. No stridor.  Abdominal:     General: There is no distension.     Tenderness: There is no abdominal tenderness.  Genitourinary:    Comments: Rectal exam unremarkable, minimal stool appreciable. Skin:    General: Skin is warm and dry.  Neurological:     Mental Status: She is alert and oriented to person, place, and time.     Cranial Nerves: No cranial nerve deficit.      ED Results / Procedures / Treatments   Labs (all labs ordered are listed, but only abnormal results are displayed) Labs Reviewed  COMPREHENSIVE METABOLIC PANEL - Abnormal; Notable for the following components:      Result Value   CO2 18 (*)    Glucose, Bld 122 (*)    Calcium 8.7 (*)    All other components within normal limits  CBC - Abnormal; Notable for the following components:   WBC 29.1 (*)    RBC 1.25 (*)    Hemoglobin 5.2 (*)    HCT 16.6 (*)    MCV 132.8 (*)    MCH 41.6 (*)    RDW 15.8 (*)    Platelets 12 (*)     All other components within normal limits  RESPIRATORY PANEL BY RT PCR (FLU A&B, COVID)  POC OCCULT BLOOD, ED  POC OCCULT BLOOD, ED  TYPE AND SCREEN  PREPARE RBC (CROSSMATCH)    EKG None  Radiology Right upper quadrant ultrasound from 2 weeks ago reviewed, notable for small hepatic lesions Procedures Procedures (including critical care time)  CRITICAL CARE Performed by: Carmin Muskrat Total critical care time: 35 minutes Critical care time was exclusive of separately billable procedures and treating other patients. Critical care was necessary to treat or prevent imminent or life-threatening deterioration.  Critical care was time spent personally by me on the following activities: development of treatment plan with patient and/or surrogate as well as nursing, discussions with consultants, evaluation of patient's response to treatment, examination of patient, obtaining history from patient or surrogate, ordering and performing treatments and interventions, ordering and review of laboratory studies, ordering and review of radiographic studies, pulse oximetry and re-evaluation of patient's condition.   Medications Ordered in ED Medications  0.9 %  sodium chloride infusion (has no administration in time range)    ED Course  I have reviewed the triage vital signs and the nursing notes.  Pertinent labs & imaging results that were available during my care of the patient were reviewed by me and considered in my medical decision making (see chart for details).  8:43 PM Patient's labs notable for anemia with a hemoglobin of 5.5, as well as thrombocytopenia, with platelet count of 12.  Hemoccult negative, and after discussing her case with our GI colleagues, some suspicion for oncologic process contributing to her blood in dyscrasia with substantial leukocytosis, anemia, thrombocytopenia. Patient is awake, alert, vital signs somewhat reassuring, but given the substantial abnormalities,  concern for blood in dyscrasia, symptomatic anemia, 5 initiated transfusion, patient will require admission for further monitoring, management.  Line other findings reassuring, no obvious source for infection, no pain, no vomiting, though she does have some nausea. Patient had ultrasound 2 weeks ago, and after discussing this with her GI colleagues, no indication for additional imaging in the ED setting.  Final Clinical Impression(s) / ED Diagnoses Final diagnoses:  Symptomatic anemia  Lymphocytosis   MDM Number of Diagnoses or Management Options Lymphocytosis: new, needed workup Symptomatic anemia: new, needed workup   Amount and/or Complexity of Data Reviewed Clinical lab tests: reviewed Tests in the radiology section of CPT: reviewed Tests in the medicine section of CPT: reviewed Discussion of test results with the performing providers: yes Decide to obtain previous medical records or to obtain history from someone other than the patient: yes Review and summarize past medical records: yes Discuss the patient with other providers: yes Independent visualization of images, tracings, or specimens: yes  Risk of Complications, Morbidity, and/or Mortality Presenting problems: high Diagnostic procedures: high Management options: high  Critical Care Total time providing critical care: 30-74 minutes  Patient Progress Patient progress: stable    Carmin Muskrat, MD 04/30/19 2046

## 2019-04-30 NOTE — H&P (Signed)
History and Physical   Kelly Roberson BEE:[id number] DOB: 05-16-1960 DOA: 04/30/2019  Referring MD/NP/PA: Dr. Vanita Panda  PCP: Ladell Pier, MD   Outpatient Specialists: Dr. Wilfrid Lund, gastroenterology  Patient coming from: Home  Chief Complaint: Low hemoglobin  HPI: Kelly Roberson is a 59 y.o. female with medical history significant of IBS, GERD, Barrett's esophagus, anxiety disorder, hypertension among other things status post reversal of Nissen fundoplication who has been having progressive weakness numbness tingling in her distal extremities.  She was seen by gastroenterology today as part of her follow-ups.  She has been having chronic thrombocytopenia on and off.  She also has elevated liver function test, Barrett's esophagus and right upper quadrant abdominal pain.  As part of the GI work-up suspicion for PMR and other rheumatologic disease was made due to markedly elevated liver function test.  Blood work was done which came back showing white count of 21,000 hemoglobin 5.2 and platelet of 19.  She has no acute bleed although she is noted skin bruise on and off.  She was sent to the ER where she is being evaluated.  Patient has no ongoing bleed.  Denied any melena denied any bright red blood per rectum.  She denies any recent viral illness.  She has had bone pains on and off.  At this point patient has bicytopenia of unknown cause.  She has been transfused 2 units of packed red blood cells in the ER.  Anemia studies was done showing no iron deficiency notable.  Patient being admitted for work-up of her bicytopenia and will consult oncology...  ED Course: Temperature 98.2 blood pressure 165/85 pulse 102 respirate 27 oxygen sats 99% on room air.  White count 29.1 hemoglobin 5.2 platelets 12,000.  Sodium 135 potassium 3.9 chloride 106 CO2 18.  COVID-19 screen is negative fecal occult blood is negative x2.  CRP is 7.3.  Total iron 263 with 86% saturation ferritin more than 1500.  J88 and  folic acid have not done.  High MCV however is 132.  Review of Systems: As per HPI otherwise 10 point review of systems negative.    Past Medical History:  Diagnosis Date  . Anxiety   . Hypertension   . IBS (irritable bowel syndrome)     Past Surgical History:  Procedure Laterality Date  . GASTRIC BYPASS  2013  . NISSEN FUNDOPLICATION  3254   Reversed in 2013  . ORIF ANKLE FRACTURE Left 06/2017  . PLACEMENT OF BREAST IMPLANTS       reports that she has quit smoking. She has never used smokeless tobacco. She reports current alcohol use. She reports that she does not use drugs.  Allergies  Allergen Reactions  . Lisinopril Cough    Family History  Problem Relation Age of Onset  . Pancreatic cancer Father   . Colon cancer Maternal Aunt   . Diabetes Maternal Uncle   . Pancreatic cancer Maternal Grandfather      Prior to Admission medications   Medication Sig Start Date End Date Taking? Authorizing Provider  hydrochlorothiazide (HYDRODIURIL) 12.5 MG tablet Take 1 tablet (12.5 mg total) by mouth daily. Must keep upcoming appt for more refills. 12/17/18   Ladell Pier, MD  losartan (COZAAR) 25 MG tablet Take 1 tablet (25 mg total) by mouth daily. Must keep upcoming appt for more refills. 12/17/18   Ladell Pier, MD  omeprazole (PRILOSEC) 20 MG capsule TAKE 1 CAPSULE (20 MG TOTAL) BY MOUTH DAILY. 03/28/19   Ladell Pier,  MD    Physical Exam: Vitals:   04/30/19 2142 04/30/19 2200 04/30/19 2209 04/30/19 2236  BP: 135/72 (!) 140/110 (!) 143/50   Pulse: 92 (!) 102 95   Resp: (!) 21 (!) 27 20   Temp: 98.5 F (36.9 C)  98.5 F (36.9 C)   TempSrc:   Oral   SpO2: 100% 100%    Weight:    96.8 kg  Height:    '5\' 8"'  (1.727 m)      Constitutional: Anxious, stable, no acute distress. Vitals:   04/30/19 2142 04/30/19 2200 04/30/19 2209 04/30/19 2236  BP: 135/72 (!) 140/110 (!) 143/50   Pulse: 92 (!) 102 95   Resp: (!) 21 (!) 27 20   Temp: 98.5 F (36.9 C)   98.5 F (36.9 C)   TempSrc:   Oral   SpO2: 100% 100%    Weight:    96.8 kg  Height:    '5\' 8"'  (1.727 m)   Eyes: PERRL, lids and conjunctivae pale ENMT: Mucous membranes are moist. Posterior pharynx clear of any exudate or lesions.Normal dentition.  Neck: normal, supple, no masses, no thyromegaly Respiratory: clear to auscultation bilaterally, no wheezing, no crackles. Normal respiratory effort. No accessory muscle use.  Cardiovascular: Sinus tachycardia, no murmurs / rubs / gallops. No extremity edema. 2+ pedal pulses. No carotid bruits.  Abdomen: no tenderness, no masses palpated. No hepatosplenomegaly. Bowel sounds positive.  Musculoskeletal: no clubbing / cyanosis. No joint deformity upper and lower extremities. Good ROM, no contractures. Normal muscle tone.  Skin: Pinpoint bruises all over, . No induration Neurologic: CN 2-12 grossly intact. Sensation intact, DTR normal. Strength 5/5 in all 4.  Psychiatric: Normal judgment and insight. Alert and oriented x 3.  Very anxious.     Labs on Admission: I have personally reviewed following labs and imaging studies  CBC: Recent Labs  Lab 04/30/19 1419 04/30/19 1936  WBC 21.7 Repeated and verified X2.* 29.1*  HGB 5.5 Repeated and verified X2.* 5.2*  HCT 16.1 Repeated and verified X2.* 16.6*  MCV 123.7 Repeated and verified X2.* 132.8*  PLT 19.0 Repeated and verified X2.* 12*   Basic Metabolic Panel: Recent Labs  Lab 04/30/19 1419 04/30/19 1936  NA 136 135  K 3.6 3.9  CL 104 106  CO2 23 18*  GLUCOSE 106* 122*  BUN 10 11  CREATININE 0.57 0.60  CALCIUM 9.1 8.7*   GFR: Estimated Creatinine Clearance: 91 mL/min (by C-G formula based on SCr of 0.6 mg/dL). Liver Function Tests: Recent Labs  Lab 04/30/19 1419 04/30/19 1936  AST 21 29  ALT 24 29  ALKPHOS 73 70  BILITOT 0.9 0.5  PROT 7.1 7.3  ALBUMIN 4.1 3.9   No results for input(s): LIPASE, AMYLASE in the last 168 hours. No results for input(s): AMMONIA in the last 168  hours. Coagulation Profile: Recent Labs  Lab 04/30/19 1419  INR 1.3*   Cardiac Enzymes: No results for input(s): CKTOTAL, CKMB, CKMBINDEX, TROPONINI in the last 168 hours. BNP (last 3 results) No results for input(s): PROBNP in the last 8760 hours. HbA1C: No results for input(s): HGBA1C in the last 72 hours. CBG: No results for input(s): GLUCAP in the last 168 hours. Lipid Profile: No results for input(s): CHOL, HDL, LDLCALC, TRIG, CHOLHDL, LDLDIRECT in the last 72 hours. Thyroid Function Tests: No results for input(s): TSH, T4TOTAL, FREET4, T3FREE, THYROIDAB in the last 72 hours. Anemia Panel: Recent Labs    04/30/19 1419  FERRITIN >1500.0*  IRON 263*  Urine analysis: No results found for: COLORURINE, APPEARANCEUR, LABSPEC, PHURINE, GLUCOSEU, HGBUR, BILIRUBINUR, KETONESUR, PROTEINUR, UROBILINOGEN, NITRITE, LEUKOCYTESUR Sepsis Labs: '@LABRCNTIP' (procalcitonin:4,lacticidven:4) ) Recent Results (from the past 240 hour(s))  Respiratory Panel by RT PCR (Flu A&B, Covid) - Nasopharyngeal Swab     Status: None   Collection Time: 04/30/19  8:51 PM   Specimen: Nasopharyngeal Swab  Result Value Ref Range Status   SARS Coronavirus 2 by RT PCR NEGATIVE NEGATIVE Final    Comment: (NOTE) SARS-CoV-2 target nucleic acids are NOT DETECTED. The SARS-CoV-2 RNA is generally detectable in upper respiratoy specimens during the acute phase of infection. The lowest concentration of SARS-CoV-2 viral copies this assay can detect is 131 copies/mL. A negative result does not preclude SARS-Cov-2 infection and should not be used as the sole basis for treatment or other patient management decisions. A negative result may occur with  improper specimen collection/handling, submission of specimen other than nasopharyngeal swab, presence of viral mutation(s) within the areas targeted by this assay, and inadequate number of viral copies (<131 copies/mL). A negative result must be combined with  clinical observations, patient history, and epidemiological information. The expected result is Negative. Fact Sheet for Patients:  PinkCheek.be Fact Sheet for Healthcare Providers:  GravelBags.it This test is not yet ap proved or cleared by the [place] FDA and  has been authorized for detection and/or diagnosis of SARS-CoV-2 by FDA under an Emergency Use Authorization (EUA). This EUA will remain  in effect (meaning this test can be used) for the duration of the COVID-19 declaration under Section 564(b)(1) of the Act, 21 U.S.C. section 360bbb-3(b)(1), unless the authorization is terminated or revoked sooner.    Influenza A by PCR NEGATIVE NEGATIVE Final   Influenza B by PCR NEGATIVE NEGATIVE Final    Comment: (NOTE) The Xpert Xpress SARS-CoV-2/FLU/RSV assay is intended as an aid in  the diagnosis of influenza from Nasopharyngeal swab specimens and  should not be used as a sole basis for treatment. Nasal washings and  aspirates are unacceptable for Xpert Xpress SARS-CoV-2/FLU/RSV  testing. Fact Sheet for Patients: PinkCheek.be Fact Sheet for Healthcare Providers: GravelBags.it This test is not yet approved or cleared by the [place] FDA and  has been authorized for detection and/or diagnosis of SARS-CoV-2 by  FDA under an Emergency Use Authorization (EUA). This EUA will remain  in effect (meaning this test can be used) for the duration of the  Covid-19 declaration under Section 564(b)(1) of the Act, 21  U.S.C. section 360bbb-3(b)(1), unless the authorization is  terminated or revoked. Performed at [facility], [place] [address], [place], [place] [address]      Radiological Exams on Admission: No results found.    Assessment/Plan Principal Problem:   Bicytopenia Active Problems:   Essential hypertension   Anxiety disorder    Barrett's esophagus with dysplasia   Mixed hyperlipidemia     #1 bicytopenia: Patient has microcytic anemia with thrombocytopenia.  We will admit the patient for work-up.  Check folic acid and I43.  Hematology consultation.  May require bone marrow biopsy.  Follow recommendations from hematology.  #2 thrombocytopenia: No active bleeding.  I will hold off on transfusion of platelets and defer to hematology.  #3 essential hypertension: Confirm and resume home regimen.  #4 mixed hyperlipidemia: Continue with statin.  #5 anxiety disorder: Continue anxiolytics.  #6 GERD with Barrett's esophagus: Continue PPIs and other home regimen.   DVT prophylaxis: SCD Code Status: Full code Family Communication: No family at bedside Disposition  Plan: Home Consults called: None but consult oncology in the morning Admission status: Inpatient  Severity of Illness: The appropriate patient status for this patient is INPATIENT. Inpatient status is judged to be reasonable and necessary in order to provide the required intensity of service to ensure the patient's safety. The patient's presenting symptoms, physical exam findings, and initial radiographic and laboratory data in the context of their chronic comorbidities is felt to place them at high risk for further clinical deterioration. Furthermore, it is not anticipated that the patient will be medically stable for discharge from the hospital within 2 midnights of admission. The following factors support the patient status of inpatient.   " The patient's presenting symptoms include generalized weakness and low hemoglobin. " The worrisome physical exam findings include pale. " The initial radiographic and laboratory data are worrisome because of hemoglobin of 5.2. " The chronic co-morbidities include hypertension.   * I certify that at the point of admission it is my clinical judgment that the patient will require inpatient hospital care spanning beyond 2  midnights from the point of admission due to high intensity of service, high risk for further deterioration and high frequency of surveillance required.Barbette Merino MD [facility] Pager [phone]  If 7PM-7AM, please contact night-coverage [email or web address] Password [facility]  04/30/2019, 10:53 PM

## 2019-04-30 NOTE — ED Notes (Signed)
Date and time results received: 04/30/19 8:17 PM  (use smartphrase ".now" to insert current time)  Test: Hgb; platelets Critical Value: 5.2; 12  Name of Provider Notified: Vanita Panda, MD  Orders Received? Or Actions Taken?:

## 2019-05-01 DIAGNOSIS — C9200 Acute myeloblastic leukemia, not having achieved remission: Secondary | ICD-10-CM | POA: Diagnosis present

## 2019-05-01 DIAGNOSIS — D72820 Lymphocytosis (symptomatic): Secondary | ICD-10-CM

## 2019-05-01 DIAGNOSIS — F419 Anxiety disorder, unspecified: Secondary | ICD-10-CM

## 2019-05-01 DIAGNOSIS — E782 Mixed hyperlipidemia: Secondary | ICD-10-CM

## 2019-05-01 DIAGNOSIS — D539 Nutritional anemia, unspecified: Secondary | ICD-10-CM

## 2019-05-01 DIAGNOSIS — M542 Cervicalgia: Secondary | ICD-10-CM | POA: Diagnosis present

## 2019-05-01 DIAGNOSIS — C9500 Acute leukemia of unspecified cell type not having achieved remission: Principal | ICD-10-CM

## 2019-05-01 DIAGNOSIS — D7589 Other specified diseases of blood and blood-forming organs: Secondary | ICD-10-CM

## 2019-05-01 DIAGNOSIS — D649 Anemia, unspecified: Secondary | ICD-10-CM

## 2019-05-01 DIAGNOSIS — I10 Essential (primary) hypertension: Secondary | ICD-10-CM

## 2019-05-01 DIAGNOSIS — K22719 Barrett's esophagus with dysplasia, unspecified: Secondary | ICD-10-CM

## 2019-05-01 DIAGNOSIS — D72829 Elevated white blood cell count, unspecified: Secondary | ICD-10-CM | POA: Diagnosis present

## 2019-05-01 DIAGNOSIS — G6289 Other specified polyneuropathies: Secondary | ICD-10-CM

## 2019-05-01 LAB — CBC
HCT: 20.1 % — ABNORMAL LOW (ref 36.0–46.0)
Hemoglobin: 6.6 g/dL — CL (ref 12.0–15.0)
MCH: 36.7 pg — ABNORMAL HIGH (ref 26.0–34.0)
MCHC: 32.8 g/dL (ref 30.0–36.0)
MCV: 111.7 fL — ABNORMAL HIGH (ref 80.0–100.0)
Platelets: 15 10*3/uL — CL (ref 150–400)
RBC: 1.8 MIL/uL — ABNORMAL LOW (ref 3.87–5.11)
WBC: 19.2 10*3/uL — ABNORMAL HIGH (ref 4.0–10.5)
nRBC: 0.1 % (ref 0.0–0.2)

## 2019-05-01 LAB — COMPREHENSIVE METABOLIC PANEL
ALT: 22 U/L (ref 0–44)
AST: 21 U/L (ref 15–41)
Albumin: 3.2 g/dL — ABNORMAL LOW (ref 3.5–5.0)
Alkaline Phosphatase: 53 U/L (ref 38–126)
Anion gap: 8 (ref 5–15)
BUN: 9 mg/dL (ref 6–20)
CO2: 21 mmol/L — ABNORMAL LOW (ref 22–32)
Calcium: 8.4 mg/dL — ABNORMAL LOW (ref 8.9–10.3)
Chloride: 109 mmol/L (ref 98–111)
Creatinine, Ser: 0.57 mg/dL (ref 0.44–1.00)
GFR calc Af Amer: >60 mL/min (ref >60)
GFR calc non Af Amer: >60 mL/min (ref >60)
Glucose, Bld: 100 mg/dL — ABNORMAL HIGH (ref 70–99)
Potassium: 3.7 mmol/L (ref 3.5–5.1)
Sodium: 138 mmol/L (ref 135–145)
Total Bilirubin: 1.4 mg/dL — ABNORMAL HIGH (ref 0.3–1.2)
Total Protein: 6 g/dL — ABNORMAL LOW (ref 6.5–8.1)

## 2019-05-01 LAB — DIC (DISSEMINATED INTRAVASCULAR COAGULATION)PANEL
D-Dimer, Quant: 0.57 ug/mL-FEU — ABNORMAL HIGH (ref 0.00–0.50)
Fibrinogen: 424 mg/dL (ref 210–475)
INR: 1.2 (ref 0.8–1.2)
Platelets: 15 10*3/uL — CL (ref 150–400)
Prothrombin Time: 14.8 seconds (ref 11.4–15.2)
Smear Review: NONE SEEN
aPTT: 30 seconds (ref 24–36)

## 2019-05-01 LAB — HIV ANTIBODY (ROUTINE TESTING W REFLEX): HIV Screen 4th Generation wRfx: NONREACTIVE

## 2019-05-01 LAB — ABO/RH: ABO/RH(D): A POS

## 2019-05-01 LAB — DIFFERENTIAL
Abs Immature Granulocytes: 0.4 10*3/uL — ABNORMAL HIGH (ref 0.00–0.07)
Basophils Absolute: 0 10*3/uL (ref 0.0–0.1)
Basophils Relative: 0 %
Blasts: 62 %
Eosinophils Absolute: 0 10*3/uL (ref 0.0–0.5)
Eosinophils Relative: 0 %
Lymphocytes Relative: 18 %
Lymphs Abs: 3.5 10*3/uL (ref 0.7–4.0)
Monocytes Absolute: 1 10*3/uL (ref 0.1–1.0)
Monocytes Relative: 5 %
Myelocytes: 2 %
Neutro Abs: 2.5 10*3/uL (ref 1.7–7.7)
Neutrophils Relative %: 13 %

## 2019-05-01 LAB — LACTATE DEHYDROGENASE: LDH: 384 U/L — ABNORMAL HIGH (ref 98–192)

## 2019-05-01 LAB — PREPARE RBC (CROSSMATCH)

## 2019-05-01 LAB — TSH: TSH: 3.267 u[IU]/mL (ref 0.350–4.500)

## 2019-05-01 LAB — SAVE SMEAR(SSMR), FOR PROVIDER SLIDE REVIEW

## 2019-05-01 LAB — FLOW CYTOMETRY REQUEST - FLUID (INPATIENT)

## 2019-05-01 LAB — PATHOLOGIST SMEAR REVIEW

## 2019-05-01 LAB — SURGICAL PATHOLOGY

## 2019-05-01 LAB — VITAMIN B12: Vitamin B-12: 3338 pg/mL — ABNORMAL HIGH (ref 180–914)

## 2019-05-01 MED ORDER — DIAZEPAM 2 MG PO TABS
2.0000 mg | ORAL_TABLET | Freq: Three times a day (TID) | ORAL | Status: DC | PRN
  Administered 2019-05-01: 18:00:00 2 mg via ORAL
  Filled 2019-05-01: qty 1

## 2019-05-01 MED ORDER — TRAMADOL HCL 50 MG PO TABS: 50.0000 mg | ORAL_TABLET | Freq: Two times a day (BID) | ORAL | Status: DC | PRN

## 2019-05-01 MED ORDER — PANTOPRAZOLE SODIUM 40 MG PO TBEC: 40.0000 mg | DELAYED_RELEASE_TABLET | Freq: Every day | ORAL | 2 refills | Status: DC

## 2019-05-01 MED ORDER — CYCLOBENZAPRINE HCL 5 MG PO TABS
5.0000 mg | ORAL_TABLET | Freq: Three times a day (TID) | ORAL | Status: DC | PRN
  Administered 2019-05-01: 17:00:00 5 mg via ORAL
  Filled 2019-05-01: qty 1

## 2019-05-01 MED ORDER — SODIUM CHLORIDE 0.9% IV SOLUTION: Freq: Once | INTRAVENOUS | Status: DC

## 2019-05-01 MED ORDER — LOSARTAN POTASSIUM 25 MG PO TABS: 25.0000 mg | ORAL_TABLET | Freq: Every day | ORAL | Status: DC

## 2019-05-01 MED ORDER — ALPRAZOLAM 1 MG PO TABS
2.0000 mg | ORAL_TABLET | Freq: Every evening | ORAL | Status: AC | PRN
  Administered 2019-05-01: 22:00:00 2 mg via ORAL
  Filled 2019-05-01: qty 2

## 2019-05-01 MED ORDER — CYCLOBENZAPRINE HCL 5 MG PO TABS: 5.0000 mg | ORAL_TABLET | Freq: Three times a day (TID) | ORAL | 0 refills | Status: DC | PRN

## 2019-05-01 MED ORDER — ONDANSETRON HCL 4 MG PO TABS: 4.0000 mg | ORAL_TABLET | Freq: Four times a day (QID) | ORAL | 0 refills | Status: DC | PRN

## 2019-05-01 NOTE — Consult Note (Signed)
Standard [phone]   [phone]  INPATIENT CONSULT NOTE  Patient Care Team: Ladell Pier, MD as PCP - General (Internal Medicine)  CHIEF COMPLAINTS/PURPOSE OF CONSULTATION:  "Concern for Leukemia "  HISTORY OF PRESENTING ILLNESS:  Kelly Roberson 59 y.o. female with medical history significant for anxiety, IBS, and hypertension who presented with several weeks of lower extremity weakness and numbness.   On review of the previous records Kelly Roberson presented to the [facility] ED on 04/30/2019 with complaints of progressive weakness, numbness, and tingling in her distal extremities. Initial blood work on arrival showed white blood cell count of 21.7, hemoglobin of 5.5, MCV of 123.7, and a platelet count of 19.  The patient's previous CBC drawn on 01/17/2019 showed a white blood cell count of 3.8, hemoglobin 12.8, MCV of 104, and a platelet count of 134.  She received 1 unit of packed red blood cells last night with 2 additional red blood cells received today.  Due to concern for this leukocytosis and anemia and thrombocytopenia the patient was admitted and hematology was consulted for further evaluation management.  On exam today Kelly Roberson notes that she has chronic fatigue dating back to at least 2017 and that it progressed from 20 18-20 19.  She reports that over the last 6 weeks her symptoms have become markedly worse.  She notes that she was having difficulty walking from one room to another in her house and she was having several episodes of shortness of breath and fatigue while walking to the grocery store.  She notes that she has not noticed any overt signs of bleeding such as nosebleeds, bruising, or dark stools.  She also has had some bouts of nausea, which has been a chronic condition since her Nissen fundoplication in the early 2000's.  She denies having any fevers, chills, sweats, vomiting or recent weight loss.  She also notes that she has some leg  jerking at night but otherwise has been asymptomatic.  A full 10 point ROS is listed below. On further review the patient is a never smoker and has no family history significant for leukemias, lymphomas, or other blood disorders.    MEDICAL HISTORY:  Past Medical History:  Diagnosis Date  . Anxiety   . Hypertension   . IBS (irritable bowel syndrome)     SURGICAL HISTORY: Past Surgical History:  Procedure Laterality Date  . GASTRIC BYPASS  2013  . NISSEN FUNDOPLICATION  3903   Reversed in 2013  . ORIF ANKLE FRACTURE Left 06/2017  . PLACEMENT OF BREAST IMPLANTS      SOCIAL HISTORY: Social History   Socioeconomic History  . Marital status: Divorced    Spouse name: Not on file  . Number of children: Not on file  . Years of education: Not on file  . Highest education level: Not on file  Occupational History  . Not on file  Tobacco Use  . Smoking status: Former Research scientist (life sciences)  . Smokeless tobacco: Never Used  Substance and Sexual Activity  . Alcohol use: Yes    Comment: occasionally   . Drug use: Never  . Sexual activity: Not Currently  Other Topics Concern  . Not on file  Social History Narrative  . Not on file   Social Determinants of Health   Financial Resource Strain:   . Difficulty of Paying Living Expenses:   Food Insecurity:   . Worried About Charity fundraiser in the Last Year:   . Ran  Out of Food in the Last Year:   Transportation Needs:   . Lack of Transportation (Medical):   Marland Kitchen Lack of Transportation (Non-Medical):   Physical Activity:   . Days of Exercise per Week:   . Minutes of Exercise per Session:   Stress:   . Feeling of Stress :   Social Connections:   . Frequency of Communication with Friends and Family:   . Frequency of Social Gatherings with Friends and Family:   . Attends Religious Services:   . Active Member of Clubs or Organizations:   . Attends Archivist Meetings:   Marland Kitchen Marital Status:   Intimate Partner Violence:   . Fear of  Current or Ex-Partner:   . Emotionally Abused:   Marland Kitchen Physically Abused:   . Sexually Abused:     FAMILY HISTORY: Family History  Problem Relation Age of Onset  . Pancreatic cancer Father   . Colon cancer Maternal Aunt   . Diabetes Maternal Uncle   . Pancreatic cancer Maternal Grandfather     ALLERGIES:  is allergic to lisinopril.  MEDICATIONS:  Current Facility-Administered Medications  Medication Dose Route Frequency Provider Last Rate Last Admin  . 0.45 % sodium chloride infusion   Intravenous Continuous Elwyn Reach, MD 75 mL/hr at 05/01/19 0315 New Bag at 05/01/19 0315  . 0.9 %  sodium chloride infusion (Manually program via Guardrails IV Fluids)   Intravenous Once Lang Snow, FNP      . LORazepam (ATIVAN) tablet 1 mg  1 mg Oral Q6H PRN Elwyn Reach, MD   1 mg at 05/01/19 1044  . losartan (COZAAR) tablet 25 mg  25 mg Oral Daily Gala Romney L, MD   25 mg at 05/01/19 1044  . ondansetron (ZOFRAN) tablet 4 mg  4 mg Oral Q6H PRN Elwyn Reach, MD       Or  . ondansetron (ZOFRAN) injection 4 mg  4 mg Intravenous Q6H PRN Elwyn Reach, MD      . pantoprazole (PROTONIX) EC tablet 40 mg  40 mg Oral Daily Elwyn Reach, MD   40 mg at 05/01/19 1044    REVIEW OF SYSTEMS:   Constitutional: ( - ) fevers, ( - )  chills , ( - ) night sweats Eyes: ( - ) blurriness of vision, ( - ) double vision, ( - ) watery eyes Ears, nose, mouth, throat, and face: ( - ) mucositis, ( - ) sore throat Respiratory: ( - ) cough, ( + ) dyspnea, ( - ) wheezes Cardiovascular: ( - ) palpitation, ( - ) chest discomfort, ( - ) lower extremity swelling Gastrointestinal:  ( - ) nausea, ( - ) heartburn, ( - ) change in bowel habits Skin: ( - ) abnormal skin rashes Lymphatics: ( - ) new lymphadenopathy, ( - ) easy bruising Neurological: ( - ) numbness, ( - ) tingling, ( - ) new weaknesses Behavioral/Psych: ( - ) mood change, ( - ) new changes  All other systems were reviewed with the  patient and are negative.  PHYSICAL EXAMINATION: ECOG PERFORMANCE STATUS: 2 - Symptomatic, <50% confined to bed  Vitals:   05/01/19 1249 05/01/19 1325  BP: (!) 149/76 (!) 154/78  Pulse: 92 93  Resp: 17 19  Temp: 98.9 F (37.2 C) 99.6 F (37.6 C)  SpO2: 100%    Filed Weights   04/30/19 2236  Weight: 213 lb 6.5 oz (96.8 kg)    GENERAL: well appearing middle aged Caucasian  female in NAD  SKIN: skin color, texture, turgor are normal, no rashes or significant lesions EYES: conjunctiva are pink and non-injected, sclera clear LUNGS: clear to auscultation and percussion with normal breathing effort HEART: regular rate & rhythm and no murmurs and no lower extremity edema Musculoskeletal: no cyanosis of digits and no clubbing  PSYCH: alert & oriented x 3, fluent speech NEURO: no focal motor/sensory deficits  LABORATORY DATA:  I have reviewed the data as listed CBC Latest Ref Rng & Units 05/01/2019 04/30/2019 04/30/2019  WBC 4.0 - 10.5 K/uL 19.2(H) 29.1(H) 21.7 Repeated and verified X2.(HH)  Hemoglobin 12.0 - 15.0 g/dL 6.6(LL) 5.2(LL) 5.5 Repeated and verified X2.(LL)  Hematocrit 36.0 - 46.0 % 20.1(L) 16.6(L) 16.1 Repeated and verified X2.(LL)  Platelets 150 - 400 K/uL 15(LL) 12(LL) 19.0 Repeated and verified X2.(LL)    CMP Latest Ref Rng & Units 05/01/2019 04/30/2019 04/30/2019  Glucose 70 - 99 mg/dL 100(H) 122(H) 106(H)  BUN 6 - 20 mg/dL '9 11 10  ' Creatinine 0.44 - 1.00 mg/dL 0.57 0.60 0.57  Sodium 135 - 145 mmol/L 138 135 136  Potassium 3.5 - 5.1 mmol/L 3.7 3.9 3.6  Chloride 98 - 111 mmol/L 109 106 104  CO2 22 - 32 mmol/L 21(L) 18(L) 23  Calcium 8.9 - 10.3 mg/dL 8.4(L) 8.7(L) 9.1  Total Protein 6.5 - 8.1 g/dL 6.0(L) 7.3 7.1  Total Bilirubin 0.3 - 1.2 mg/dL 1.4(H) 0.5 0.9  Alkaline Phos 38 - 126 U/L 53 70 73  AST 15 - 41 U/L '21 29 21  ' ALT 0 - 44 U/L '22 29 24    ' PATHOLOGY: Bone marrow biopsy defer to [facility]   BLOOD FILM:  Review of the peripheral blood smear showed high number  of peripheral myeloblasts, with 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 Red cells are hypochromic but show no anisopoikilocytosis, macrocytes , microcytes or polychromasia. There were no schistocytes, target cells, echinocytes, acanthocytes, dacrocytes, or stomatocytes.There was no rouleaux formation, nucleated red cells, or intra-cellular inclusions noted. The platelets markedly decreased in number, though otherwise normal in size, shape, and color without any clumping evident.  RADIOGRAPHIC STUDIES:  US Abdomen Limited RUQ  Result Date: 04/15/2019 CLINICAL DATA:  Abnormal liver function test. EXAM: ULTRASOUND ABDOMEN LIMITED RIGHT UPPER QUADRANT COMPARISON:  None. FINDINGS: Gallbladder: No gallstones or wall thickening visualized. No sonographic Murphy sign noted by sonographer. Common bile duct: Diameter: 5 mm which is within normal limits. Liver: Two rounded echogenic foci are noted, with 3.1 cm abnormality seen in left hepatic lobe and 2.7 cm abnormality in right hepatic lobe. Within normal limits in parenchymal echogenicity. Portal vein is patent on color Doppler imaging with normal direction of blood flow towards the liver. Other: None. IMPRESSION: Two rounded echogenic foci are noted in the liver, the largest measuring 3.1 cm in left hepatic lobe. 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 If the patient does have a history of malignancy, further evaluation with MRI with and without gadolinium is recommended evaluate for possible metastatic disease. Electronically Signed   By: Marijo Conception M.D.   On: 04/15/2019 15:45    ASSESSMENT & PLAN Kelly Roberson 59 y.o. female with medical history significant for anxiety, IBS, and hypertension who presented with several weeks of lower extremity weakness and numbness.  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  This is supported by the fact that the patient had a relatively normal CBC in January 2021, although macrocytosis and thrombocytopenia was noted at that time.  I would recommend that we transfuse the patient with a hemoglobin goal of greater than 7.0 and additionally we collect a DIC panel as these patients are prone to DIC.  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  I will defer the performing of a bone marrow biopsy to the [facility] team.  Our hematology service will be available for support while this patient is in-house and will help facilitate the transfer to [facility].  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 I recommend transfer to the leukemia service at [facility] --bone marrow biopsy will need to be performed, recommend this be done after transfer --Hematology service will continue to follow while she is in house and will help facilitate the transfer   All questions were answered. The patient knows to call the clinic with any problems, questions or concerns. My business care was provided with contact information.   A total of more than 55 minutes were spent on this encounter and over half of that time was spent on counseling and coordination of care as outlined above.   Ledell Peoples, MD Department of Hematology/Oncology [place] [facility] Phone: [phone] Pager: [phone] Email:  [email or web address]  05/01/2019 2:26 PM

## 2019-05-01 NOTE — Progress Notes (Signed)
[facility]  PROGRESS NOTE  Kelly Roberson:[id number] DOB: November 27, 1960 DOA: 04/30/2019 PCP: Ladell Pier, MD Admit date - 04/30/2019   Admitting Physician Elwyn Reach, MD  Outpatient Primary MD for the patient is Ladell Pier, MD  LOS - 1 Brief Narrative   Kelly Roberson is a 59 y.o. year old female with medical history significant for IBS, anxiety, HTN who presented on 04/30/2019 with progressive weakness for several weeks presented with acute anemia and thrombocytopenia on Outpatient lab work-up.  Patient  was seen by her GI specialist for elevated LFTs (AST 223, ALT 92, on 1/21) and mild thrombocytopenia of 134.  Patient underwent repeat outpatient CMP with CBC on 4/27 and was found to have hemoglobin of 5.2 and platelet count of 12 with WBC of 29 which prompted patient to be directed to the ED for further evaluation.  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  Subjective  Today she is anxious.  Denies any shortness of breath, no chest pain, no abdominal pain.  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  Presented with leukocytosis, acute thrombocytopenia, anemia and progressive weakness over several weeks.  Acuity supported by fairly normal white count, hemoglobin on 1/21 (WBC 3.8, hemoglobin 12.8, platelets 134) -Appreciate hematology/oncology recommendations -LDH, DIC panel, haptoglobin -Transfuse to maintain goal hemoglobin greater than 7 -Arranging transfer to [facility] to leukemia service, bone marrow biopsy at that time per heme recommendations as our hospital is unable to treat acute leukemia  Acute macrocytic anemia in setting of acute leukemia, symptomatic.  No current signs symptoms of bleeding.  Likely explains profound weakness over  several weeks, currently without any shortness of breath or chest pain -Currently receiving second unit of blood -Continue close monitor hemoglobin, goal hemoglobin greater than 7 -Daily CBC  Thrombocytopenia acute, stable.  No active bleeding.  Continue to closely monitor.  Currently 15 -Daily CBCs -Transfuse for goal greater than 10  Right  Neck pain.  Suspect MSK, reproducible on exam with palpation -Flexeril as needed  HTN, stable -Continue home losartan 25 mg -Holding home HCTZ (takes for PRN swelling)  Hyperlipidemia, stable  Anxiety disorder.  Reports previous history of Klonopin.  Has been off for 1 year. -Ativan as needed  GERD with Barrett's esophagus.  No current nausea or vomiting.  No abdominal pain. -Continue PPI     Family Communication  : None  Code Status : Full code  Disposition Plan  :  Patient is from home. Anticipated d/c date:  Greater than 3 days. Barriers to d/c or necessity for inpatient status:  Needs treatment and close monitoring for acute leukemia, warrants transfer to tertiary care center Consults  : Hematology  Procedures  : None  DVT Prophylaxis  : SCDs  Lab Results  Component Value Date   PLT 15 (LL) 05/01/2019    Diet :  Diet Order            Diet Heart Room service appropriate? Yes; Fluid consistency: Thin  Diet effective now               Inpatient Medications Scheduled Meds: . sodium chloride   Intravenous Once  . losartan  25 mg Oral Daily  . pantoprazole  40 mg Oral Daily   Continuous Infusions: . sodium chloride 75 mL/hr at 05/01/19 0315  PRN Meds:.cyclobenzaprine, LORazepam, ondansetron **OR** ondansetron (ZOFRAN) IV  Antibiotics  :   Anti-infectives (From admission, onward)   None       Objective   Vitals:   05/01/19 0432 05/01/19 0824 05/01/19 1249 05/01/19 1325  BP: 137/73 (!) 147/72 (!) 149/76 (!) 154/78  Pulse: 88 87 92 93  Resp: '16 18 17 19  ' Temp: 98.7 F (37.1 C) 98.6 F (37 C) 98.9 F  (37.2 C) 99.6 F (37.6 C)  TempSrc: Oral Oral Oral Oral  SpO2: 99% 96% 100%   Weight:      Height:        SpO2: 100 %  Wt Readings from Last 3 Encounters:  04/30/19 96.8 kg  04/30/19 96.9 kg  01/18/19 95.4 kg     Intake/Output Summary (Last 24 hours) at 05/01/2019 1620 Last data filed at 05/01/2019 1310 Gross per 24 hour  Intake 1720 ml  Output --  Net 1720 ml    Physical Exam:     Awake Alert, Oriented X 3, anxious, pale No new F.N deficits, Conjunctiva pallor [place].AT, Normal respiratory effort on room air CTAB RRR, no edema  +ve B.Sounds, Abd Soft, No tenderness, No rebound, guarding or rigidity. No Cyanosis, No new Rash or bruise    I have personally reviewed the following:   Data Reviewed:  CBC Recent Labs  Lab 04/30/19 1419 04/30/19 1936 05/01/19 0516  WBC 21.7 Repeated and verified X2.* 29.1* 19.2*  HGB 5.5 Repeated and verified X2.* 5.2* 6.6*  HCT 16.1 Repeated and verified X2.* 16.6* 20.1*  PLT 19.0 Repeated and verified X2.* 12* 15*  MCV 123.7 Repeated and verified X2.* 132.8* 111.7*  MCH  --  41.6* 36.7*  MCHC 34.0 31.3 32.8  RDW 16.7* 15.8* Not Measured  LYMPHSABS  --   --  3.5  MONOABS  --   --  1.0  EOSABS  --   --  0.0  BASOSABS  --   --  0.0    Chemistries  Recent Labs  Lab 04/30/19 1419 04/30/19 1936 05/01/19 0516  NA 136 135 138  K 3.6 3.9 3.7  CL 104 106 109  CO2 23 18* 21*  GLUCOSE 106* 122* 100*  BUN '10 11 9  ' CREATININE 0.57 0.60 0.57  CALCIUM 9.1 8.7* 8.4*  AST '21 29 21  ' ALT '24 29 22  ' ALKPHOS 73 70 53  BILITOT 0.9 0.5 1.4*   ------------------------------------------------------------------------------------------------------------------ No results for input(s): CHOL, HDL, LDLCALC, TRIG, CHOLHDL, LDLDIRECT in the last 72 hours.  Lab Results  Component Value Date   HGBA1C 5.4 01/18/2019   ------------------------------------------------------------------------------------------------------------------ Recent  Labs    05/01/19 0043  TSH 3.267   ------------------------------------------------------------------------------------------------------------------ Recent Labs    04/30/19 1419 05/01/19 0043  VITAMINB12  --  3,338*  FERRITIN >1500.0*  --   IRON 263*  --     Coagulation profile Recent Labs  Lab 04/30/19 1419  INR 1.3*    No results for input(s): DDIMER in the last 72 hours.  Cardiac Enzymes No results for input(s): CKMB, TROPONINI, MYOGLOBIN in the last 168 hours.  Invalid input(s): CK ------------------------------------------------------------------------------------------------------------------ No results found for: BNP  Micro Results Recent Results (from the past 240 hour(s))  Respiratory Panel by RT PCR (Flu A&B, Covid) - Nasopharyngeal Swab     Status: None   Collection Time: 04/30/19  8:51 PM   Specimen: Nasopharyngeal Swab  Result Value Ref Range Status   SARS Coronavirus 2 by RT PCR NEGATIVE NEGATIVE Final  Comment: (NOTE) SARS-CoV-2 target nucleic acids are NOT DETECTED. The SARS-CoV-2 RNA is generally detectable in upper respiratoy specimens during the acute phase of infection. The lowest concentration of SARS-CoV-2 viral copies this assay can detect is 131 copies/mL. A negative result does not preclude SARS-Cov-2 infection and should not be used as the sole basis for treatment or other patient management decisions. A negative result may occur with  improper specimen collection/handling, submission of specimen other than nasopharyngeal swab, presence of viral mutation(s) within the areas targeted by this assay, and inadequate number of viral copies (<131 copies/mL). A negative result must be combined with clinical observations, patient history, and epidemiological information. The expected result is Negative. Fact Sheet for Patients:  PinkCheek.be Fact Sheet for Healthcare Providers:    GravelBags.it This test is not yet ap proved or cleared by the [place] FDA and  has been authorized for detection and/or diagnosis of SARS-CoV-2 by FDA under an Emergency Use Authorization (EUA). This EUA will remain  in effect (meaning this test can be used) for the duration of the COVID-19 declaration under Section 564(b)(1) of the Act, 21 U.S.C. section 360bbb-3(b)(1), unless the authorization is terminated or revoked sooner.    Influenza A by PCR NEGATIVE NEGATIVE Final   Influenza B by PCR NEGATIVE NEGATIVE Final    Comment: (NOTE) The Xpert Xpress SARS-CoV-2/FLU/RSV assay is intended as an aid in  the diagnosis of influenza from Nasopharyngeal swab specimens and  should not be used as a sole basis for treatment. Nasal washings and  aspirates are unacceptable for Xpert Xpress SARS-CoV-2/FLU/RSV  testing. Fact Sheet for Patients: PinkCheek.be Fact Sheet for Healthcare Providers: GravelBags.it This test is not yet approved or cleared by the [place] FDA and  has been authorized for detection and/or diagnosis of SARS-CoV-2 by  FDA under an Emergency Use Authorization (EUA). This EUA will remain  in effect (meaning this test can be used) for the duration of the  Covid-19 declaration under Section 564(b)(1) of the Act, 21  U.S.C. section 360bbb-3(b)(1), unless the authorization is  terminated or revoked. Performed at [facility], [place] [address] [place], [place] [address]     Radiology Reports US Abdomen Limited RUQ  Result Date: 04/15/2019 CLINICAL DATA:  Abnormal liver function test. EXAM: ULTRASOUND ABDOMEN LIMITED RIGHT UPPER QUADRANT COMPARISON:  None. FINDINGS: Gallbladder: No gallstones or wall thickening visualized. No sonographic Murphy sign noted by sonographer. Common bile duct: Diameter: 5 mm which is within normal limits. Liver: Two rounded echogenic  foci are noted, with 3.1 cm abnormality seen in left hepatic lobe and 2.7 cm abnormality in right hepatic lobe. Within normal limits in parenchymal echogenicity. Portal vein is patent on color Doppler imaging with normal direction of blood flow towards the liver. Other: None. IMPRESSION: Two rounded echogenic foci are noted in the liver, the largest measuring 3.1 cm in left hepatic lobe. 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 If the patient does have a history of malignancy, further evaluation with MRI with and without gadolinium is recommended evaluate for possible metastatic disease. Electronically Signed   By: Marijo Conception M.D.   On: 04/15/2019 15:45     Time Spent in minutes  30     Desiree Hane M.D on 05/01/2019 at 4:20 PM  To page go to [email or web address] - password [facility]

## 2019-05-01 NOTE — Progress Notes (Signed)
Report given to nurse Lilia Pro at [facility] for bed 9120. Awaiting ETA from [employer].

## 2019-05-01 NOTE — Progress Notes (Addendum)
Multiple abnormal labs including Hgb 6.6 post 2 PRBC; paged floor coverage.

## 2019-05-01 NOTE — Progress Notes (Signed)
Pt being transferred to [facility] for higher level of care.    General: A&O x 4, anxious about diagnosis and transfer  Cardio: RRR, no murmurs, rubs, or gallops noted. Pulses +2 all extremities, no edema noted.  Resp: LTA bilaterally, increased WOB noted.  GI: Bowel sounds x4, soft, non tender, obese.  MS: S M A E  Psych: Responds appropriately, fluent speech  Xanax and tramadol prior to transfer for pain control and anxiety.  Okay to transfer at this time.

## 2019-05-02 LAB — TYPE AND SCREEN
ABO/RH(D): A POS
Antibody Screen: NEGATIVE
Unit division: 0
Unit division: 0
Unit division: 0

## 2019-05-02 LAB — BPAM RBC
Blood Product Expiration Date: 202105082359
Blood Product Expiration Date: 202105112359
Blood Product Expiration Date: 202105122359
ISSUE DATE / TIME: 202104272147
ISSUE DATE / TIME: 202104280049
ISSUE DATE / TIME: 202104281303
Unit Type and Rh: 6200
Unit Type and Rh: 6200
Unit Type and Rh: 6200

## 2019-05-02 LAB — FOLATE RBC
Folate, Hemolysate: 226 ng/mL
Folate, RBC: 1270 ng/mL (ref >498)
Hematocrit: 17.8 % — CL (ref 34.0–46.6)

## 2019-05-02 MED ORDER — LIDOCAINE HCL 1 % IJ SOLN
0.50 | INTRAMUSCULAR | Status: DC
Start: ? — End: 2019-05-02

## 2019-05-02 MED ORDER — ALLOPURINOL 300 MG PO TABS
300.00 | ORAL_TABLET | ORAL | Status: DC
Start: 2019-05-02 — End: 2019-05-02

## 2019-05-02 MED ORDER — PANTOPRAZOLE SODIUM 40 MG PO TBEC
40.00 | DELAYED_RELEASE_TABLET | ORAL | Status: DC
Start: 2019-05-06 — End: 2019-05-02

## 2019-05-02 MED ORDER — LOSARTAN POTASSIUM 50 MG PO TABS
25.00 | ORAL_TABLET | ORAL | Status: DC
Start: 2019-05-06 — End: 2019-05-02

## 2019-05-02 MED ORDER — HYDROCHLOROTHIAZIDE 25 MG PO TABS
12.50 | ORAL_TABLET | ORAL | Status: DC
Start: 2019-05-06 — End: 2019-05-02

## 2019-05-02 MED ORDER — ALLOPURINOL 300 MG PO TABS
300.00 | ORAL_TABLET | ORAL | Status: DC
Start: 2019-05-06 — End: 2019-05-02

## 2019-05-02 NOTE — Progress Notes (Addendum)
CRITICAL VALUE STICKER  CRITICAL VALUE: Hematocrit 17.8  - [employer] (on-site recipient of call): P. Zenia Resides, RN at [place] NOTIFIED: 05/02/19 0234  MESSENGER (representative from lab):  Nurse NOTIFIED: Lilia Pro, RN at [place]: [phone]

## 2019-05-03 LAB — ANA: Anti Nuclear Antibody (ANA): NEGATIVE

## 2019-05-03 LAB — ANTI-SMOOTH MUSCLE ANTIBODY, IGG: Actin (Smooth Muscle) Antibody (IGG): <20 U (ref <20)

## 2019-05-03 LAB — MITOCHONDRIAL ANTIBODIES: Mitochondrial M2 Ab, IgG: <=20 U

## 2019-05-03 LAB — CERULOPLASMIN: Ceruloplasmin: 31 mg/dL (ref 18–53)

## 2019-05-03 LAB — ALPHA-1-ANTITRYPSIN: A-1 Antitrypsin, Ser: 182 mg/dL (ref 83–199)

## 2019-05-03 LAB — HAPTOGLOBIN: Haptoglobin: 139 mg/dL (ref 33–346)

## 2019-05-05 MED ORDER — LIDOCAINE HCL 1 % IJ SOLN
3.00 | INTRAMUSCULAR | Status: DC
Start: ? — End: 2019-05-05

## 2019-05-05 MED ORDER — CAMPHOR-MENTHOL 0.5-0.5 % EX LOTN
TOPICAL_LOTION | CUTANEOUS | Status: DC
Start: ? — End: 2019-05-05

## 2019-05-05 MED ORDER — LORAZEPAM 1 MG PO TABS
1.00 | ORAL_TABLET | ORAL | Status: DC
Start: ? — End: 2019-05-05

## 2019-05-05 MED ORDER — ACYCLOVIR 200 MG PO CAPS
400.00 | ORAL_CAPSULE | ORAL | Status: DC
Start: 2019-05-05 — End: 2019-05-05

## 2019-05-05 MED ORDER — ONDANSETRON HCL 8 MG PO TABS
16.00 | ORAL_TABLET | ORAL | Status: DC
Start: 2019-05-05 — End: 2019-05-05

## 2019-05-05 MED ORDER — ENTECAVIR 0.5 MG PO TABS
0.50 | ORAL_TABLET | ORAL | Status: DC
Start: 2019-05-06 — End: 2019-05-05

## 2019-05-05 MED ORDER — PROCHLORPERAZINE MALEATE 10 MG PO TABS
10.00 | ORAL_TABLET | ORAL | Status: DC
Start: ? — End: 2019-05-05

## 2019-05-05 MED ORDER — DAUNORUBICIN HCL 50 MG/10ML IV SOLN
60.00 | INTRAVENOUS | Status: DC
Start: 2019-05-05 — End: 2019-05-05

## 2019-05-05 MED ORDER — METHYLPREDNISOLONE SODIUM SUCC 125 MG IJ SOLR
125.00 | INTRAMUSCULAR | Status: DC
Start: ? — End: 2019-05-05

## 2019-05-05 MED ORDER — HYDROXYZINE PAMOATE 25 MG PO CAPS
25.00 | ORAL_CAPSULE | ORAL | Status: DC
Start: ? — End: 2019-05-05

## 2019-05-05 MED ORDER — LORAZEPAM 2 MG/ML IJ SOLN
1.00 | INTRAMUSCULAR | Status: DC
Start: ? — End: 2019-05-05

## 2019-05-05 MED ORDER — NASAL SPRAY 0.05 % NA SOLN
2.00 | NASAL | Status: DC
Start: ? — End: 2019-05-05

## 2019-05-05 MED ORDER — ACETAMINOPHEN 325 MG PO TABS
975.00 | ORAL_TABLET | ORAL | Status: DC
Start: ? — End: 2019-05-05

## 2019-05-05 MED ORDER — MORPHINE SULFATE (PF) 2 MG/ML IV SOLN
2.00 | INTRAVENOUS | Status: DC
Start: ? — End: 2019-05-05

## 2019-05-05 MED ORDER — DIPHENHYDRAMINE HCL 50 MG/ML IJ SOLN
25.00 | INTRAMUSCULAR | Status: DC
Start: ? — End: 2019-05-05

## 2019-05-05 MED ORDER — GENERIC EXTERNAL MEDICATION
100.00 | Status: DC
Start: 2019-05-05 — End: 2019-05-05

## 2019-05-05 MED ORDER — ACETAMINOPHEN 325 MG PO TABS
650.00 | ORAL_TABLET | ORAL | Status: DC
Start: ? — End: 2019-05-05

## 2019-05-05 MED ORDER — ACETAMINOPHEN 325 MG PO TABS
650.00 | ORAL_TABLET | ORAL | Status: DC
Start: 2019-05-07 — End: 2019-05-05

## 2019-05-05 MED ORDER — DEXTROSE-SODIUM CHLORIDE 5-0.45 % IV SOLN
INTRAVENOUS | Status: DC
Start: ? — End: 2019-05-05

## 2019-05-05 MED ORDER — GENERIC EXTERNAL MEDICATION
4.50 | Status: DC
Start: 2019-05-07 — End: 2019-05-05

## 2019-05-05 MED ORDER — POLYETHYLENE GLYCOL 3350 17 GM/SCOOP PO POWD
17.00 | ORAL | Status: DC
Start: 2019-05-06 — End: 2019-05-05

## 2019-05-05 MED ORDER — DEXAMETHASONE 4 MG PO TABS
12.00 | ORAL_TABLET | ORAL | Status: DC
Start: 2019-05-05 — End: 2019-05-05

## 2019-05-05 MED ORDER — IPRATROPIUM-ALBUTEROL 0.5-2.5 (3) MG/3ML IN SOLN
3.00 | RESPIRATORY_TRACT | Status: DC
Start: ? — End: 2019-05-05

## 2019-05-05 MED ORDER — GENERIC EXTERNAL MEDICATION
3.38 | Status: DC
Start: 2019-05-05 — End: 2019-05-05

## 2019-05-05 MED ORDER — GABAPENTIN 300 MG PO CAPS
300.00 | ORAL_CAPSULE | ORAL | Status: DC
Start: 2019-05-05 — End: 2019-05-05

## 2019-05-05 MED ORDER — PROCHLORPERAZINE EDISYLATE 10 MG/2ML IJ SOLN
10.00 | INTRAMUSCULAR | Status: DC
Start: ? — End: 2019-05-05

## 2019-05-05 MED ORDER — DIPHENHYDRAMINE HCL 25 MG PO CAPS
50.00 | ORAL_CAPSULE | ORAL | Status: DC
Start: 2019-05-07 — End: 2019-05-05

## 2019-05-05 MED ORDER — METHYLPREDNISOLONE SODIUM SUCC 125 MG IJ SOLR
95.00 | INTRAMUSCULAR | Status: DC
Start: 2019-05-07 — End: 2019-05-05

## 2019-05-05 MED ORDER — SENNOSIDES-DOCUSATE SODIUM 8.6-50 MG PO TABS
2.00 | ORAL_TABLET | ORAL | Status: DC
Start: 2019-05-05 — End: 2019-05-05

## 2019-05-05 MED ORDER — TRAZODONE HCL 50 MG PO TABS
25.00 | ORAL_TABLET | ORAL | Status: DC
Start: ? — End: 2019-05-05

## 2019-05-05 NOTE — Discharge Summary (Signed)
Kelly Roberson JTT:[id number] DOB: 13-Oct-1960 DOA: 04/30/2019  PCP: Ladell Pier, MD  Admit date: 04/30/2019 Discharge date: 05/01/2019  Admitted From: home Disposition: Transfer to [facility] for higher level of care   Discharge Condition: Stable CODE STATUS: Full   Brief/Interim Summary: History of present illness:  Kelly Roberson is a 59 y.o. year old female with medical history significant for IBS, anxiety, HTN who presented on 04/30/2019 with progressive weakness for several weeks presented with acute anemia and thrombocytopenia on Outpatient lab work-up.  Patient  was seen by her GI specialist for elevated LFTs (AST 223, ALT 92, on 1/21) and mild thrombocytopenia of 134.  Patient underwent repeat outpatient CMP with CBC on 4/27 and was found to have hemoglobin of 5.2 and platelet count of 12 with WBC of 29 which prompted patient to be directed to the ED for further evaluation.  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  Presented with leukocytosis, acute thrombocytopenia, anemia and progressive weakness over several weeks.  Acuity supported by fairly normal white count, hemoglobin on 1/21 (WBC 3.8, hemoglobin 12.8, platelets 134) -Appreciate hematology/oncology recommendations -LDH, DIC panel, haptoglobin -Transfuse to maintain goal hemoglobin greater than 7 -Arranging transfer to [facility] to leukemia service, bone marrow biopsy at that time per heme recommendations as our hospital is unable to treat acute leukemia  Acute macrocytic anemia in setting of acute leukemia, symptomatic.  No current signs symptoms of bleeding.  Likely explains profound weakness over several weeks, currently  without any shortness of breath or chest pain -Currently receiving second unit of blood -Continue close monitor hemoglobin, goal hemoglobin greater than 7 -Daily CBC  Thrombocytopenia acute, stable.  No active bleeding.  Continue to closely monitor.   -Daily CBCs -Transfuse for goal greater than 10  Right  Neck pain.  Suspect MSK, reproducible on exam with palpation -Flexeril as needed  HTN, stable -Continue home losartan 25 mg -Holding home HCTZ (takes for PRN swelling)  Hyperlipidemia, stable  Anxiety disorder.  Reports previous history of Klonopin.  Has been off for 1 year. -Ativan as needed  GERD with Barrett's esophagus.  No current nausea or vomiting.  No abdominal pain. -Continue PPI   Consultations:  Hematology oncology  Procedures/Studies: none Subjective: Very anxious Discharge Exam: Vitals:   05/01/19 2128 05/01/19 2159  BP: (!) 154/79 (!) 154/79  Pulse: 93 93  Resp: 18 18  Temp: 99.2 F (37.3 C) 99.2 F (37.3 C)  SpO2: 100% 100%   Vitals:   05/01/19 1556 05/01/19 2048 05/01/19 2128 05/01/19 2159  BP: (!) 164/74 (!) 154/79 (!) 154/79 (!) 154/79  Pulse: 90 93 93 93  Resp: _0 Temp: 99.2 F (37.3 C) 99.2 F (37.3 C) 99.2 F (37.3 C) 99.2 F (37.3 C)  TempSrc: Oral Oral Oral Oral  SpO2: 100% 100% 100% 100%  Weight:      Height:        Awake Alert, Oriented X 3, anxious, pale No new F.N deficits, Conjunctiva pallor [place].AT, Normal respiratory effort on room air CTAB RRR, no edema  +ve B.Sounds, Abd Soft, No tenderness, No rebound, guarding or rigidity. No Cyanosis, No new Rash or bruise   Discharge Diagnoses:  Principal Problem:   Bicytopenia Active Problems:  Essential hypertension   Anxiety disorder   Barrett's esophagus with dysplasia   Mixed hyperlipidemia   Peripheral neuropathy   Macrocytic anemia   Acute leukemia (HCC)   Neck pain on right side   Leukocytosis    Discharge Instructions  Discharge  Instructions    Diet - low sodium heart healthy   Complete by: As directed    Increase activity slowly   Complete by: As directed      Allergies as of 05/01/2019      Reactions   Lisinopril Cough      Medication List    STOP taking these medications   hydrochlorothiazide 12.5 MG tablet Commonly known as: HYDRODIURIL   omeprazole 20 MG capsule Commonly known as: PRILOSEC Replaced by: pantoprazole 40 MG tablet     TAKE these medications   cyclobenzaprine 5 MG tablet Commonly known as: FLEXERIL Take 1 tablet (5 mg total) by mouth 3 (three) times daily as needed for muscle spasms.   losartan 25 MG tablet Commonly known as: Cozaar Take 1 tablet (25 mg total) by mouth daily. What changed: additional instructions   ondansetron 4 MG tablet Commonly known as: ZOFRAN Take 1 tablet (4 mg total) by mouth every 6 (six) hours as needed for nausea.   pantoprazole 40 MG tablet Commonly known as: PROTONIX Take 1 tablet (40 mg total) by mouth daily. Replaces: omeprazole 20 MG capsule   traMADol 50 MG tablet Commonly known as: ULTRAM Take 1 tablet (50 mg total) by mouth every 12 (twelve) hours as needed for moderate pain or severe pain.       Allergies  Allergen Reactions  . Lisinopril Cough        The results of significant diagnostics from this hospitalization (including imaging, microbiology, ancillary and laboratory) are listed below for reference.     Microbiology: Recent Results (from the past 240 hour(s))  Respiratory Panel by RT PCR (Flu A&B, Covid) - Nasopharyngeal Swab     Status: None   Collection Time: 04/30/19  8:51 PM   Specimen: Nasopharyngeal Swab  Result Value Ref Range Status   SARS Coronavirus 2 by RT PCR NEGATIVE NEGATIVE Final    Comment: (NOTE) SARS-CoV-2 target nucleic acids are NOT DETECTED. The SARS-CoV-2 RNA is generally detectable in upper respiratoy specimens during the acute phase of infection. The lowest concentration of SARS-CoV-2  viral copies this assay can detect is 131 copies/mL. A negative result does not preclude SARS-Cov-2 infection and should not be used as the sole basis for treatment or other patient management decisions. A negative result may occur with  improper specimen collection/handling, submission of specimen other than nasopharyngeal swab, presence of viral mutation(s) within the areas targeted by this assay, and inadequate number of viral copies (<131 copies/mL). A negative result must be combined with clinical observations, patient history, and epidemiological information. The expected result is Negative. Fact Sheet for Patients:  https://www.fda.gov/media/142436/download Fact Sheet for Healthcare Providers:  https://www.fda.gov/media/142435/download This test is not yet ap proved or cleared by the [place] FDA and  has been authorized for detection and/or diagnosis of SARS-CoV-2 by FDA under an Emergency Use Authorization (EUA). This EUA will remain  in effect (meaning this test can be used) for the duration of the COVID-19 declaration under Section 564(b)(1) of the Act, 21 U.S.C. section 360bbb-3(b)(1), unless the authorization is terminated or revoked sooner.    Influenza A by PCR NEGATIVE NEGATIVE Final   Influenza B by PCR NEGATIVE NEGATIVE Final    Comment: (  NOTE) The Xpert Xpress SARS-CoV-2/FLU/RSV assay is intended as an aid in  the diagnosis of influenza from Nasopharyngeal swab specimens and  should not be used as a sole basis for treatment. Nasal washings and  aspirates are unacceptable for Xpert Xpress SARS-CoV-2/FLU/RSV  testing. Fact Sheet for Patients: PinkCheek.be Fact Sheet for Healthcare Providers: GravelBags.it This test is not yet approved or cleared by the [place] FDA and  has been authorized for detection and/or diagnosis of SARS-CoV-2 by  FDA under an Emergency Use Authorization (EUA). This EUA will  remain  in effect (meaning this test can be used) for the duration of the  Covid-19 declaration under Section 564(b)(1) of the Act, 21  U.S.C. section 360bbb-3(b)(1), unless the authorization is  terminated or revoked. Performed at [facility], [place] [address], [place], [place] [address]      Labs: BNP (last 3 results) No results for input(s): BNP in the last 8760 hours. Basic Metabolic Panel: Recent Labs  Lab 04/30/19 1419 04/30/19 1936 05/01/19 0516  NA 136 135 138  K 3.6 3.9 3.7  CL 104 106 109  CO2 23 18* 21*  GLUCOSE 106* 122* 100*  BUN _0 CREATININE 0.57 0.60 0.57  CALCIUM 9.1 8.7* 8.4*   Liver Function Tests: Recent Labs  Lab 04/30/19 1419 04/30/19 1936 05/01/19 0516  AST _1 ALT _2 ALKPHOS 73 70 53  BILITOT 0.9 0.5 1.4*  PROT 7.1 7.3 6.0*  ALBUMIN 4.1 3.9 3.2*   No results for input(s): LIPASE, AMYLASE in the last 168 hours. No results for input(s): AMMONIA in the last 168 hours. CBC: Recent Labs  Lab 04/30/19 1419 04/30/19 1936 05/01/19 0043 05/01/19 0516 05/01/19 1744  WBC 21.7 Repeated and verified X2.* 29.1*  --  19.2*  --   NEUTROABS  --   --   --  2.5  --   HGB 5.5 Repeated and verified X2.* 5.2*  --  6.6*  --   HCT 16.1 Repeated and verified X2.* 16.6* 17.8* 20.1*  --   MCV 123.7 Repeated and verified X2.* 132.8*  --  111.7*  --   PLT 19.0 Repeated and verified X2.* 12*  --  15* 15*   Cardiac Enzymes: No results for input(s): CKTOTAL, CKMB, CKMBINDEX, TROPONINI in the last 168 hours. BNP: Invalid input(s): POCBNP CBG: No results for input(s): GLUCAP in the last 168 hours. D-Dimer No results for input(s): DDIMER in the last 72 hours. Hgb A1c No results for input(s): HGBA1C in the last 72 hours. Lipid Profile No results for input(s): CHOL, HDL, LDLCALC, TRIG, CHOLHDL, LDLDIRECT in the last 72 hours. Thyroid function studies No results for input(s): TSH, T4TOTAL, T3FREE, THYROIDAB in the last 72  hours.  Invalid input(s): FREET3 Anemia work up No results for input(s): VITAMINB12, FOLATE, FERRITIN, TIBC, IRON, RETICCTPCT in the last 72 hours. Urinalysis No results found for: COLORURINE, APPEARANCEUR, [place], [place], [place], [place], [place], [place], PROTEINUR, UROBILINOGEN, NITRITE, LEUKOCYTESUR Sepsis Labs Invalid input(s): PROCALCITONIN,  WBC,  LACTICIDVEN Microbiology Recent Results (from the past 240 hour(s))  Respiratory Panel by RT PCR (Flu A&B, Covid) - Nasopharyngeal Swab     Status: None   Collection Time: 04/30/19  8:51 PM   Specimen: Nasopharyngeal Swab  Result Value Ref Range Status   SARS Coronavirus 2 by RT PCR NEGATIVE NEGATIVE Final    Comment: (NOTE) SARS-CoV-2 target nucleic acids are NOT DETECTED. The SARS-CoV-2 RNA is generally detectable in upper respiratoy  specimens during the acute phase of infection. The lowest concentration of SARS-CoV-2 viral copies this assay can detect is 131 copies/mL. A negative result does not preclude SARS-Cov-2 infection and should not be used as the sole basis for treatment or other patient management decisions. A negative result may occur with  improper specimen collection/handling, submission of specimen other than nasopharyngeal swab, presence of viral mutation(s) within the areas targeted by this assay, and inadequate number of viral copies (<131 copies/mL). A negative result must be combined with clinical observations, patient history, and epidemiological information. The expected result is Negative. Fact Sheet for Patients:  PinkCheek.be Fact Sheet for Healthcare Providers:  GravelBags.it This test is not yet ap proved or cleared by the [place] FDA and  has been authorized for detection and/or diagnosis of SARS-CoV-2 by FDA under an Emergency Use Authorization (EUA). This EUA will remain  in effect (meaning this test can be used) for the duration  of the COVID-19 declaration under Section 564(b)(1) of the Act, 21 U.S.C. section 360bbb-3(b)(1), unless the authorization is terminated or revoked sooner.    Influenza A by PCR NEGATIVE NEGATIVE Final   Influenza B by PCR NEGATIVE NEGATIVE Final    Comment: (NOTE) The Xpert Xpress SARS-CoV-2/FLU/RSV assay is intended as an aid in  the diagnosis of influenza from Nasopharyngeal swab specimens and  should not be used as a sole basis for treatment. Nasal washings and  aspirates are unacceptable for Xpert Xpress SARS-CoV-2/FLU/RSV  testing. Fact Sheet for Patients: PinkCheek.be Fact Sheet for Healthcare Providers: GravelBags.it This test is not yet approved or cleared by the [place] FDA and  has been authorized for detection and/or diagnosis of SARS-CoV-2 by  FDA under an Emergency Use Authorization (EUA). This EUA will remain  in effect (meaning this test can be used) for the duration of the  Covid-19 declaration under Section 564(b)(1) of the Act, 21  U.S.C. section 360bbb-3(b)(1), unless the authorization is  terminated or revoked. Performed at [facility], [place] [address], [place], [place] [address]      Time coordinating discharge: Over 30 minutes  SIGNED:   Desiree Hane, MD  [facility] 05/05/2019, 4:11 PM Pager   If 7PM-7AM, please contact night-coverage [email or web address] Password [email or web address]

## 2019-05-06 ENCOUNTER — Telehealth: Payer: Self-pay | Admitting: Neurology

## 2019-05-06 NOTE — Telephone Encounter (Signed)
FYI

## 2019-05-06 NOTE — Telephone Encounter (Signed)
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

## 2019-05-09 ENCOUNTER — Ambulatory Visit: Payer: Medicaid Other | Admitting: Neurology

## 2019-05-14 ENCOUNTER — Ambulatory Visit: Payer: Medicaid Other | Admitting: Internal Medicine

## 2019-06-04 MED FILL — POTASSIUM CL ER 20 MEQ TABL: 20 | 15 days supply | Qty: 60 | Fill #0

## 2019-06-05 ENCOUNTER — Telehealth: Payer: Self-pay | Admitting: *Deleted

## 2019-06-05 NOTE — Telephone Encounter (Signed)
Received call from patient. She was seen by [name] in the ED on 05/01/19 and subsequently sent to [facility] for treatment of Acute Leukemia.  Pt states she was released form [facility] on Saturday, Jun 01, 2019. She is asking if she can have her weekly labs done here (needs 2 x a week) as it is too far for her to drive twice a week and she has limited support here.  She also states she needs to have a bone marrow biopsy done s/p induction treatment. It is scheduled for tomorrow at [facility] at 10:30 am and she is sedated for this. She states she does not have transportation for that appt.  Advised that we could likely work out the lab visits with [name] co-managing her care but likely the the bone marrow biopsy would need to still be done at [facility]. Encouraged her to call [facility] and talk to them at the Oncology department about arranging transportation. She did say that they arranged transportation home at the time of hospital discharge. Charnita stated that she would call [facility].  I told her I would discuss the lab issues with [name]. She voiced understanding.

## 2019-06-07 ENCOUNTER — Telehealth: Payer: Self-pay | Admitting: Hematology and Oncology

## 2019-06-07 ENCOUNTER — Other Ambulatory Visit: Payer: Self-pay | Admitting: Hematology and Oncology

## 2019-06-07 NOTE — Telephone Encounter (Signed)
Called Mrs. Coppa to discuss the plan moving forward for her treatment.  I have contacted Shawnie Dapper at [facility] who performed the patient's bone marrow biopsy yesterday.  She noted the plan was for the patient to return to [facility] on 06/17/2019 for consolidation chemotherapy for her AML.  Subsequently the patient will be following up in our clinic on 06/24/2019 for G-CSF support and transfusion support.  We are happy to provide comanage care in conjunction with [facility] for this patient's acute leukemia.  Ms. Valcarcel voiced her understanding and support of this plan.  We will plan to see her back on 06/24/2019 with transfusion if necessary.  Ledell Peoples, MD Department of Hematology/Oncology [place] [facility] Phone: [phone] Pager: [phone] Email: [email or web address]

## 2019-06-13 ENCOUNTER — Telehealth: Payer: Self-pay | Admitting: Gastroenterology

## 2019-06-13 NOTE — Telephone Encounter (Signed)
Pt called stating that she was looking at the results of the labs that she had done on 4/27 and noticed that her iron levels were very high, she wants to know if this could be associated with hemochromatosis and if she should be concerned about it since her mother had cirrhosis. Pls call her.

## 2019-06-13 NOTE — Telephone Encounter (Signed)
See note below and advise. 

## 2019-06-19 ENCOUNTER — Telehealth: Payer: Self-pay | Admitting: *Deleted

## 2019-06-19 DIAGNOSIS — C9501 Acute leukemia of unspecified cell type, in remission: Secondary | ICD-10-CM

## 2019-06-19 NOTE — Telephone Encounter (Signed)
Thanks for the question.  It is difficult to interpret the elevated iron levels in the setting of acute leukemia.  I see that she is still undergoing chemotherapy in [facility].  When leukemia hopefully goes into remission, then her oncologist can certainly check the iron levels again to see if still elevated.  She is also welcome to return to [place] when that happens.

## 2019-06-19 NOTE — Telephone Encounter (Signed)
Spoke with pt and she is aware.

## 2019-06-19 NOTE — Telephone Encounter (Signed)
Received call from Derinda Late, [place] for [facility] . He states this patient received her consolidation chemo as in patient and will be discharged on Saturday, 06/22/19. He would like to have pt scheduled for Neulasta injection and labs on 06/24/19 along with follow up appt with [name]. She will need CBC  And CMP 2 x a week through the week of 07/08/19. She will receive her 2nd consolidation treatment beginning on 07/15/19  [name] made aware.  High priority scheduling message sent

## 2019-06-20 ENCOUNTER — Other Ambulatory Visit: Payer: Self-pay | Admitting: Hematology and Oncology

## 2019-06-20 ENCOUNTER — Telehealth: Payer: Self-pay | Admitting: Hematology and Oncology

## 2019-06-20 NOTE — Telephone Encounter (Signed)
Scheduled appt per 6/16 sch message - no answer when calling pt. Left message with appt date and time

## 2019-06-24 ENCOUNTER — Inpatient Hospital Stay: Payer: Medicaid Other

## 2019-06-24 ENCOUNTER — Encounter: Payer: Self-pay | Admitting: Hematology and Oncology

## 2019-06-24 ENCOUNTER — Other Ambulatory Visit: Payer: Self-pay | Admitting: *Deleted

## 2019-06-24 ENCOUNTER — Other Ambulatory Visit: Payer: Self-pay | Admitting: Hematology and Oncology

## 2019-06-24 ENCOUNTER — Inpatient Hospital Stay (HOSPITAL_BASED_OUTPATIENT_CLINIC_OR_DEPARTMENT_OTHER): Payer: Medicaid Other | Admitting: Hematology and Oncology

## 2019-06-24 ENCOUNTER — Inpatient Hospital Stay: Payer: Medicaid Other | Attending: Hematology and Oncology

## 2019-06-24 ENCOUNTER — Other Ambulatory Visit: Payer: Self-pay

## 2019-06-24 VITALS — BP 131/92 | HR 64 | Resp 18

## 2019-06-24 VITALS — BP 135/75 | HR 64 | Temp 97.8°F | Resp 18 | Ht 68.0 in | Wt 197.7 lb

## 2019-06-24 DIAGNOSIS — C9501 Acute leukemia of unspecified cell type, in remission: Secondary | ICD-10-CM

## 2019-06-24 DIAGNOSIS — Z87891 Personal history of nicotine dependence: Secondary | ICD-10-CM | POA: Insufficient documentation

## 2019-06-24 DIAGNOSIS — Z7689 Persons encountering health services in other specified circumstances: Secondary | ICD-10-CM | POA: Diagnosis not present

## 2019-06-24 DIAGNOSIS — C9200 Acute myeloblastic leukemia, not having achieved remission: Secondary | ICD-10-CM | POA: Insufficient documentation

## 2019-06-24 DIAGNOSIS — Z9221 Personal history of antineoplastic chemotherapy: Secondary | ICD-10-CM | POA: Diagnosis not present

## 2019-06-24 DIAGNOSIS — Z79899 Other long term (current) drug therapy: Secondary | ICD-10-CM | POA: Diagnosis not present

## 2019-06-24 DIAGNOSIS — R5383 Other fatigue: Secondary | ICD-10-CM | POA: Insufficient documentation

## 2019-06-24 DIAGNOSIS — D701 Agranulocytosis secondary to cancer chemotherapy: Secondary | ICD-10-CM

## 2019-06-24 DIAGNOSIS — I10 Essential (primary) hypertension: Secondary | ICD-10-CM | POA: Insufficient documentation

## 2019-06-24 DIAGNOSIS — D696 Thrombocytopenia, unspecified: Secondary | ICD-10-CM

## 2019-06-24 DIAGNOSIS — T451X5A Adverse effect of antineoplastic and immunosuppressive drugs, initial encounter: Secondary | ICD-10-CM

## 2019-06-24 LAB — CMP (CANCER CENTER ONLY)
ALT: 50 U/L — ABNORMAL HIGH (ref 0–44)
AST: 61 U/L — ABNORMAL HIGH (ref 15–41)
Albumin: 3.3 g/dL — ABNORMAL LOW (ref 3.5–5.0)
Alkaline Phosphatase: 85 U/L (ref 38–126)
Anion gap: 10 (ref 5–15)
BUN: 13 mg/dL (ref 6–20)
CO2: 28 mmol/L (ref 22–32)
Calcium: 9.6 mg/dL (ref 8.9–10.3)
Chloride: 102 mmol/L (ref 98–111)
Creatinine: 0.64 mg/dL (ref 0.44–1.00)
GFR, Est AFR Am: >60 mL/min (ref >60)
GFR, Estimated: >60 mL/min (ref >60)
Glucose, Bld: 107 mg/dL — ABNORMAL HIGH (ref 70–99)
Potassium: 3.9 mmol/L (ref 3.5–5.1)
Sodium: 140 mmol/L (ref 135–145)
Total Bilirubin: 1 mg/dL (ref 0.3–1.2)
Total Protein: 6.3 g/dL — ABNORMAL LOW (ref 6.5–8.1)

## 2019-06-24 LAB — CBC WITH DIFFERENTIAL (CANCER CENTER ONLY)
Abs Immature Granulocytes: 0 10*3/uL (ref 0.00–0.07)
Basophils Absolute: 0 10*3/uL (ref 0.0–0.1)
Basophils Relative: 1 %
Eosinophils Absolute: 0.1 10*3/uL (ref 0.0–0.5)
Eosinophils Relative: 6 %
HCT: 36.3 % (ref 36.0–46.0)
Hemoglobin: 11.7 g/dL — ABNORMAL LOW (ref 12.0–15.0)
Immature Granulocytes: 0 %
Lymphocytes Relative: 41 %
Lymphs Abs: 0.4 10*3/uL — ABNORMAL LOW (ref 0.7–4.0)
MCH: 31.7 pg (ref 26.0–34.0)
MCHC: 32.2 g/dL (ref 30.0–36.0)
MCV: 98.4 fL (ref 80.0–100.0)
Monocytes Absolute: 0 10*3/uL — ABNORMAL LOW (ref 0.1–1.0)
Monocytes Relative: 1 %
Neutro Abs: 0.5 10*3/uL — ABNORMAL LOW (ref 1.7–7.7)
Neutrophils Relative %: 51 %
Platelet Count: 76 10*3/uL — ABNORMAL LOW (ref 150–400)
RBC: 3.69 MIL/uL — ABNORMAL LOW (ref 3.87–5.11)
RDW: 17.2 % — ABNORMAL HIGH (ref 11.5–15.5)
WBC Count: 0.9 10*3/uL — CL (ref 4.0–10.5)
nRBC: 0 % (ref 0.0–0.2)

## 2019-06-24 LAB — LACTATE DEHYDROGENASE: LDH: 198 U/L — ABNORMAL HIGH (ref 98–192)

## 2019-06-24 MED ORDER — PEGFILGRASTIM-CBQV 6 MG/0.6ML ~~LOC~~ SOSY
PREFILLED_SYRINGE | SUBCUTANEOUS | Status: AC
  Filled 2019-06-24: qty 0.6

## 2019-06-24 MED ORDER — PEGFILGRASTIM-CBQV 6 MG/0.6ML ~~LOC~~ SOSY
6.0000 mg | PREFILLED_SYRINGE | Freq: Once | SUBCUTANEOUS | Status: AC
  Administered 2019-06-24: 6 mg via SUBCUTANEOUS

## 2019-06-24 NOTE — Patient Instructions (Signed)

## 2019-06-24 NOTE — Progress Notes (Signed)
Received call from lab. Pt's WBC is 0.9. [name] made aware.

## 2019-06-25 ENCOUNTER — Encounter: Payer: Self-pay | Admitting: Pharmacy Technician

## 2019-06-25 ENCOUNTER — Encounter: Payer: Self-pay | Admitting: Hematology and Oncology

## 2019-06-25 NOTE — Progress Notes (Signed)
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 Found to have pancytopenia and peripheral blasts with intracellular inclusions.  2) 05/01/2019: transferred to [facility] 3) 05/04/2019: patient started induction therapy for AML with 7+3 and gemtuzumab on Day 4. 4) 05/16/2019: repeat Bmbx shows no residual leukemia 5) 06/17/2019: patient returned for Cycle 1 Consolidation with cytarabine and gemtuzumab 6) 06/24/2019: presents to reconnect with [facility] for co-managed care. 7) 07/15/2019: anticipated Cycle 2 of consolidation therapy   Interval History:  Kelly Roberson 59 y.o. female with medical history significant for AML s/p induction therapy and 1 cycle of consolidation who presents for a follow up visit. The patient's last visit was on 04/30/2019 at time of diagnosis. In the interim since the last visit the patient was transferred to [facility] for induction chemotherapy. Her clinical course was complicated by febrile neutropenia. Her subsequent post treatment Bmbx showed no residual leukemia. She presents today to start local co-managed care.   On exam today Kelly Roberson notes that her primary issue since diagnosis and treatment has been fatigue.  This consists of having low energy and limits her in what she is able to do in her daily activity.  She notes that she has not had any issues with fevers, chills, sweats, nausea, vomiting or diarrhea since her discharge from consolidation cycle 1.  She notes that her appetite is fluctuant, but overall she has lost less than 10 pounds.  She also reports today that she is not any issues with bleeding, bruising, or dark stools.  Overall she reports that there  have been no issues with rash, sore throat, or other infectious symptoms.  A full 10 point ROS is listed below.  MEDICAL HISTORY:  Past Medical History:  Diagnosis Date  . Anxiety   . Hypertension   . IBS (irritable bowel syndrome)     SURGICAL HISTORY: Past Surgical History:  Procedure Laterality Date  . GASTRIC BYPASS  2013  . NISSEN FUNDOPLICATION  6415   Reversed in 2013  . ORIF ANKLE FRACTURE Left 06/2017  . PLACEMENT OF BREAST IMPLANTS      SOCIAL HISTORY: Social History   Socioeconomic History  . Marital status: Divorced    Spouse name: Not on file  . Number of children: Not on file  . Years of education: Not on file  . Highest education level: Not on file  Occupational History  . Not on file  Tobacco Use  . Smoking status: Former Research scientist (life sciences)  . Smokeless tobacco: Never Used  Substance and Sexual Activity  . Alcohol use: Not Currently    Comment: occasionally   . Drug use: Not Currently  . Sexual activity: Not Currently  Other Topics Concern  . Not on file  Social History Narrative  . Not on file   Social Determinants of Health   Financial Resource Strain:   . Difficulty of Paying Living Expenses:   Food Insecurity:   . Worried About Charity fundraiser in the Last Year:   . Arboriculturist in the Last Year:   Transportation Needs:   . Film/video editor (Medical):   Marland Kitchen Lack of Transportation (Non-Medical):   Physical Activity:   . Days of Exercise per Week:   . Minutes of  Exercise per Session:   Stress:   . Feeling of Stress :   Social Connections:   . Frequency of Communication with Friends and Family:   . Frequency of Social Gatherings with Friends and Family:   . Attends Religious Services:   . Active Member of Clubs or Organizations:   . Attends Archivist Meetings:   Marland Kitchen Marital Status:   Intimate Partner Violence:   . Fear of Current or Ex-Partner:   . Emotionally Abused:   Marland Kitchen Physically Abused:   . Sexually Abused:      FAMILY HISTORY: Family History  Problem Relation Age of Onset  . Pancreatic cancer Father   . Colon cancer Maternal Aunt   . Diabetes Maternal Uncle   . Pancreatic cancer Maternal Grandfather     ALLERGIES:  is allergic to lisinopril.  MEDICATIONS:  Current Outpatient Medications  Medication Sig Dispense Refill  . acyclovir (ZOVIRAX) 200 MG capsule Take by mouth.    . dicyclomine (BENTYL) 10 MG capsule Take by mouth.    . entecavir (BARACLUDE) 0.5 MG tablet Take by mouth.    . escitalopram (LEXAPRO) 10 MG tablet Take by mouth.    . folic acid (FOLVITE) 1 MG tablet Take by mouth.    . gabapentin (NEURONTIN) 300 MG capsule Take by mouth.    . hydrochlorothiazide (HYDRODIURIL) 25 MG tablet Take by mouth.    Marland Kitchen LORazepam (ATIVAN) 1 MG tablet Take by mouth.    . losartan (COZAAR) 25 MG tablet Take 1 tablet (25 mg total) by mouth daily.    Marland Kitchen omeprazole (PRILOSEC OTC) 20 MG tablet Take 20 mg by mouth daily.    . ondansetron (ZOFRAN) 4 MG tablet Take 1 tablet (4 mg total) by mouth every 6 (six) hours as needed for nausea. 20 tablet 0  . potassium chloride SA (KLOR-CON) 20 MEQ tablet Take by mouth.    . traMADol (ULTRAM) 50 MG tablet Take 1 tablet (50 mg total) by mouth every 12 (twelve) hours as needed for moderate pain or severe pain. 30 tablet   . traZODone (DESYREL) 50 MG tablet Take by mouth.    Marland Kitchen VITAMIN D, CHOLECALCIFEROL, PO Take by mouth.    . cyclobenzaprine (FLEXERIL) 5 MG tablet Take 1 tablet (5 mg total) by mouth 3 (three) times daily as needed for muscle spasms. (Patient not taking: Reported on 06/24/2019) 30 tablet 0   No current facility-administered medications for this visit.    REVIEW OF SYSTEMS:   Constitutional: ( - ) fevers, ( - )  chills , ( - ) night sweats Eyes: ( - ) blurriness of vision, ( - ) double vision, ( - ) watery eyes Ears, nose, mouth, throat, and face: ( - ) mucositis, ( - ) sore throat Respiratory: ( - ) cough, ( - ) dyspnea, ( - )  wheezes Cardiovascular: ( - ) palpitation, ( - ) chest discomfort, ( - ) lower extremity swelling Gastrointestinal:  ( - ) nausea, ( - ) heartburn, ( - ) change in bowel habits Skin: ( - ) abnormal skin rashes Lymphatics: ( - ) new lymphadenopathy, ( - ) easy bruising Neurological: ( - ) numbness, ( - ) tingling, ( - ) new weaknesses Behavioral/Psych: ( - ) mood change, ( - ) new changes  All other systems were reviewed with the patient and are negative.  PHYSICAL EXAMINATION: ECOG PERFORMANCE STATUS: 1 - Symptomatic but completely ambulatory  Vitals:   06/24/19 1100  BP: 135/75  Pulse: 64  Resp: 18  Temp: 97.8 F (36.6 C)  SpO2: 99%   Filed Weights   06/24/19 1100  Weight: 197 lb 11.2 oz (89.7 kg)    GENERAL: well appearing middle aged Caucasian female, alert, no distress and comfortable SKIN: skin color, texture, turgor are normal, no rashes or significant lesions EYES: conjunctiva are pink and non-injected, sclera clear LUNGS: clear to auscultation and percussion with normal breathing effort HEART: regular rate & rhythm and no murmurs and no lower extremity edema Musculoskeletal: no cyanosis of digits and no clubbing  PSYCH: alert & oriented x 3, fluent speech NEURO: no focal motor/sensory deficits  LABORATORY DATA:  I have reviewed the data as listed CBC Latest Ref Rng & Units 06/24/2019 05/01/2019 05/01/2019  WBC 4.0 - 10.5 K/uL 0.9(LL) - 19.2(H)  Hemoglobin 12.0 - 15.0 g/dL 11.7(L) - 6.6(LL)  Hematocrit 36 - 46 % 36.3 - 20.1(L)  Platelets 150 - 400 K/uL 76(L) 15(LL) 15(LL)    CMP Latest Ref Rng & Units 06/24/2019 05/01/2019 04/30/2019  Glucose 70 - 99 mg/dL 107(H) 100(H) 122(H)  BUN 6 - 20 mg/dL '13 9 11  ' Creatinine 0.44 - 1.00 mg/dL 0.64 0.57 0.60  Sodium 135 - 145 mmol/L 140 138 135  Potassium 3.5 - 5.1 mmol/L 3.9 3.7 3.9  Chloride 98 - 111 mmol/L 102 109 106  CO2 22 - 32 mmol/L 28 21(L) 18(L)  Calcium 8.9 - 10.3 mg/dL 9.6 8.4(L) 8.7(L)  Total Protein 6.5 - 8.1  g/dL 6.3(L) 6.0(L) 7.3  Total Bilirubin 0.3 - 1.2 mg/dL 1.0 1.4(H) 0.5  Alkaline Phos 38 - 126 U/L 85 53 70  AST 15 - 41 U/L 61(H) 21 29  ALT 0 - 44 U/L 50(H) 22 29    RADIOGRAPHIC STUDIES: No results found.  ASSESSMENT & PLAN Kelly Roberson 59 y.o. female with medical history significant for AML s/p induction therapy and 1 cycle of consolidation who presents for a follow up visit.  We are happy to have Kelly Roberson return to clinic after having been sent to [facility] for induction chemotherapy for acute myeloid leukemia.  Today we discussed the nature of comanaged care in the treatment of acute myeloid leukemia.  Comanage care involves the treatment recommendations and administration of chemotherapy by a specialized academic cancer center with local support here at our community hospital.  We would be happy to provide services including emergency care, medication refills, blood tests, transfusion, and supportive care such as G-CSF in the management of her AML.  Major clinical decisions regarding the treatment of the leukemia itself are deferred to [facility].  We are also able to provide imaging services as well as bone marrow biopsy if required.  On exam today Kelly Roberson appears to be doing quite well other than some persistent fatigue which existed prior to her treatment.  She is not currently having any issues with fevers, chills, sweats, nausea, vomiting or diarrhea.  We assured that she has all the medication she requires and the no further refills are needed today.  We will plan to continue to see her twice weekly for lab checks until her next consolidation therapy on 07/15/2019.  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 Elmo Putt providing recommendations. We are happy to provide blood checks and treatment for urgent issues locally. --patient  currently completed induction and 1 cycle consolidation therapy. Next cycle planned  for 07/15/2019. --in the interim we will have the patient seen for laboratory checks twice weekly. Her Hgb and Plt today are well above transfusion threshold --patient is neutropenic, will administer pegfilgrastim as noted below --continue f/u at [facility] with Dr. Elmo Putt. We will continue to monitor her closely locally. --RTC 07/10/2019 or sooner if labs show concerning abnormalities.   #Infection Prophylaxis --patient currently on levofloxacin 530m PO daily x 14 days --continue acyclovir 4039mBID -- PEG filgrastim to be administered today -- no current central access in place (PICC pulled) -- no f/c/s on exam today.   No orders of the defined types were placed in this encounter.   All questions were answered. The patient knows to call the clinic with any problems, questions or concerns.  A total of more than 30 minutes were spent on this encounter and over half of that time was spent on counseling and coordination of care as outlined above.   JoLedell PeoplesMD Department of Hematology/Oncology [facility]: [phone]: [phone]: [email or web address]  06/25/2019 7:31 PM

## 2019-06-25 NOTE — Progress Notes (Signed)
Patient has been approved for drug assistance by [employer] for [employer]. The enrollment period is from 06/24/19-06/23/20 based on self pay. First DOS covered is 06/24/19.

## 2019-06-27 ENCOUNTER — Inpatient Hospital Stay (HOSPITAL_BASED_OUTPATIENT_CLINIC_OR_DEPARTMENT_OTHER): Payer: Medicaid Other | Admitting: Hematology and Oncology

## 2019-06-27 ENCOUNTER — Telehealth: Payer: Self-pay | Admitting: Hematology and Oncology

## 2019-06-27 ENCOUNTER — Other Ambulatory Visit: Payer: Self-pay

## 2019-06-27 ENCOUNTER — Other Ambulatory Visit: Payer: Self-pay | Admitting: *Deleted

## 2019-06-27 ENCOUNTER — Inpatient Hospital Stay: Payer: Medicaid Other

## 2019-06-27 DIAGNOSIS — C9501 Acute leukemia of unspecified cell type, in remission: Secondary | ICD-10-CM

## 2019-06-27 DIAGNOSIS — C9200 Acute myeloblastic leukemia, not having achieved remission: Secondary | ICD-10-CM | POA: Diagnosis not present

## 2019-06-27 LAB — CBC WITH DIFFERENTIAL (CANCER CENTER ONLY)
Abs Immature Granulocytes: 0 10*3/uL (ref 0.00–0.07)
Basophils Absolute: 0 10*3/uL (ref 0.0–0.1)
Basophils Relative: 0 %
Eosinophils Absolute: 0 10*3/uL (ref 0.0–0.5)
Eosinophils Relative: 0 %
HCT: 34.2 % — ABNORMAL LOW (ref 36.0–46.0)
Hemoglobin: 11.4 g/dL — ABNORMAL LOW (ref 12.0–15.0)
Immature Granulocytes: 0 %
Lymphocytes Relative: 98 %
Lymphs Abs: 0.4 10*3/uL — ABNORMAL LOW (ref 0.7–4.0)
MCH: 31.8 pg (ref 26.0–34.0)
MCHC: 33.3 g/dL (ref 30.0–36.0)
MCV: 95.5 fL (ref 80.0–100.0)
Monocytes Absolute: 0 10*3/uL — ABNORMAL LOW (ref 0.1–1.0)
Monocytes Relative: 0 %
Neutro Abs: 0 10*3/uL — CL (ref 1.7–7.7)
Neutrophils Relative %: 2 %
Platelet Count: 6 10*3/uL — CL (ref 150–400)
RBC: 3.58 MIL/uL — ABNORMAL LOW (ref 3.87–5.11)
RDW: 16.3 % — ABNORMAL HIGH (ref 11.5–15.5)
WBC Count: 0.4 10*3/uL — CL (ref 4.0–10.5)
nRBC: 0 % (ref 0.0–0.2)

## 2019-06-27 LAB — CMP (CANCER CENTER ONLY)
ALT: 37 U/L (ref 0–44)
AST: 36 U/L (ref 15–41)
Albumin: 3.5 g/dL (ref 3.5–5.0)
Alkaline Phosphatase: 108 U/L (ref 38–126)
Anion gap: 13 (ref 5–15)
BUN: 12 mg/dL (ref 6–20)
CO2: 25 mmol/L (ref 22–32)
Calcium: 9.9 mg/dL (ref 8.9–10.3)
Chloride: 102 mmol/L (ref 98–111)
Creatinine: 0.61 mg/dL (ref 0.44–1.00)
GFR, Est AFR Am: >60 mL/min (ref >60)
GFR, Estimated: >60 mL/min (ref >60)
Glucose, Bld: 100 mg/dL — ABNORMAL HIGH (ref 70–99)
Potassium: 3.7 mmol/L (ref 3.5–5.1)
Sodium: 140 mmol/L (ref 135–145)
Total Bilirubin: 1.4 mg/dL — ABNORMAL HIGH (ref 0.3–1.2)
Total Protein: 6.7 g/dL (ref 6.5–8.1)

## 2019-06-27 LAB — SAMPLE TO BLOOD BANK

## 2019-06-27 LAB — TYPE AND SCREEN
ABO/RH(D): A POS
Antibody Screen: NEGATIVE

## 2019-06-27 LAB — ABO/RH: ABO/RH(D): A POS

## 2019-06-27 MED ORDER — ACETAMINOPHEN 325 MG PO TABS: 650.0000 mg | ORAL_TABLET | Freq: Once | ORAL | Status: DC

## 2019-06-27 MED ORDER — SODIUM CHLORIDE 0.9% IV SOLUTION
250.0000 mL | Freq: Once | INTRAVENOUS | Status: AC
  Administered 2019-06-27: 250 mL via INTRAVENOUS
  Filled 2019-06-27: qty 250

## 2019-06-27 NOTE — Telephone Encounter (Signed)
Scheduled per los. Called and spoke with patient. Confirmed added appt 

## 2019-06-27 NOTE — Progress Notes (Signed)
Tolerated transfusion well. No evidence of reaction.  Reviewed discharge instructions with her for low platelets and neutropenia. Reviewed upcoming appts Pt voiced understanding. Discharged home.

## 2019-06-27 NOTE — Patient Instructions (Addendum)

## 2019-06-28 ENCOUNTER — Ambulatory Visit: Payer: Medicaid Other

## 2019-06-28 LAB — PREPARE PLATELET PHERESIS: Unit division: 0

## 2019-06-28 LAB — BPAM PLATELET PHERESIS
Blood Product Expiration Date: 202106262359
ISSUE DATE / TIME: 202106241406
Unit Type and Rh: 5100

## 2019-06-30 ENCOUNTER — Encounter: Payer: Self-pay | Admitting: Hematology and Oncology

## 2019-06-30 ENCOUNTER — Encounter (HOSPITAL_COMMUNITY): Payer: Self-pay

## 2019-06-30 ENCOUNTER — Other Ambulatory Visit: Payer: Self-pay

## 2019-06-30 ENCOUNTER — Inpatient Hospital Stay (HOSPITAL_COMMUNITY)
Admission: EM | Admit: 2019-06-30 | Discharge: 2019-07-03 | DRG: 809 | Disposition: A | Discharge status: Home / Self Care | Payer: Medicaid Other | Attending: Internal Medicine | Admitting: Internal Medicine

## 2019-06-30 ENCOUNTER — Emergency Department (HOSPITAL_COMMUNITY): Payer: Medicaid Other

## 2019-06-30 DIAGNOSIS — E876 Hypokalemia: Secondary | ICD-10-CM | POA: Diagnosis present

## 2019-06-30 DIAGNOSIS — D61810 Antineoplastic chemotherapy induced pancytopenia: Principal | ICD-10-CM | POA: Diagnosis present

## 2019-06-30 DIAGNOSIS — R5081 Fever presenting with conditions classified elsewhere: Secondary | ICD-10-CM | POA: Diagnosis present

## 2019-06-30 DIAGNOSIS — D709 Neutropenia, unspecified: Secondary | ICD-10-CM

## 2019-06-30 DIAGNOSIS — Z79899 Other long term (current) drug therapy: Secondary | ICD-10-CM

## 2019-06-30 DIAGNOSIS — T451X5A Adverse effect of antineoplastic and immunosuppressive drugs, initial encounter: Secondary | ICD-10-CM | POA: Diagnosis present

## 2019-06-30 DIAGNOSIS — D701 Agranulocytosis secondary to cancer chemotherapy: Secondary | ICD-10-CM | POA: Diagnosis present

## 2019-06-30 DIAGNOSIS — R519 Headache, unspecified: Secondary | ICD-10-CM | POA: Diagnosis present

## 2019-06-30 DIAGNOSIS — F419 Anxiety disorder, unspecified: Secondary | ICD-10-CM | POA: Diagnosis present

## 2019-06-30 DIAGNOSIS — E782 Mixed hyperlipidemia: Secondary | ICD-10-CM | POA: Diagnosis present

## 2019-06-30 DIAGNOSIS — G629 Polyneuropathy, unspecified: Secondary | ICD-10-CM | POA: Diagnosis present

## 2019-06-30 DIAGNOSIS — D61818 Other pancytopenia: Secondary | ICD-10-CM

## 2019-06-30 DIAGNOSIS — K589 Irritable bowel syndrome without diarrhea: Secondary | ICD-10-CM | POA: Diagnosis present

## 2019-06-30 DIAGNOSIS — K219 Gastro-esophageal reflux disease without esophagitis: Secondary | ICD-10-CM | POA: Diagnosis present

## 2019-06-30 DIAGNOSIS — F329 Major depressive disorder, single episode, unspecified: Secondary | ICD-10-CM | POA: Diagnosis present

## 2019-06-30 DIAGNOSIS — C9200 Acute myeloblastic leukemia, not having achieved remission: Secondary | ICD-10-CM | POA: Diagnosis present

## 2019-06-30 DIAGNOSIS — I10 Essential (primary) hypertension: Secondary | ICD-10-CM | POA: Diagnosis present

## 2019-06-30 DIAGNOSIS — Z800 Family history of malignant neoplasm of digestive organs: Secondary | ICD-10-CM

## 2019-06-30 DIAGNOSIS — Z87891 Personal history of nicotine dependence: Secondary | ICD-10-CM

## 2019-06-30 DIAGNOSIS — Z9884 Bariatric surgery status: Secondary | ICD-10-CM

## 2019-06-30 DIAGNOSIS — Z20822 Contact with and (suspected) exposure to covid-19: Secondary | ICD-10-CM | POA: Diagnosis present

## 2019-06-30 MED ORDER — SODIUM CHLORIDE 0.9 % IV SOLN
1000.0000 mL | INTRAVENOUS | Status: DC
  Administered 2019-07-01: 1000 mL via INTRAVENOUS

## 2019-06-30 MED ORDER — SODIUM CHLORIDE 0.9 % IV BOLUS (SEPSIS)
1000.0000 mL | Freq: Once | INTRAVENOUS | Status: AC
  Administered 2019-07-01: 1000 mL via INTRAVENOUS

## 2019-06-30 NOTE — ED Triage Notes (Signed)
Pt BIB EMS from home. Pt reports fever x4 hours and progressive headache. Pt also reports lethargy. Hx of leukemia.  BP 100/60 HR 110 SpO2 99% RA

## 2019-06-30 NOTE — Progress Notes (Signed)
Brief Interval Note  Mrs. Kristensen was noted to have Plt 6, ANC 0.0, WBC 0.4, and Hgb 11.4. Provided platelet transfusion today.  Talked with patient who denied having fevers, chills, sweats, nausea, or overt signs of bleeding. Discussed neutropenic and thrombocytopenic precautions.   Plan to have patient return on Monday for repeat lab checks and transfusion if necessary.  Ledell Peoples, MD Department of Hematology/Oncology [place] [facility] Phone: [phone] Pager: [phone] Email: [email or web address]

## 2019-06-30 NOTE — ED Provider Notes (Signed)
[facility] DEPT Provider Note  CSN: [id number] Arrival date & time: 06/30/19 2256  Chief Complaint(s) No chief complaint on file.  HPI Kelly Roberson is a 59 y.o. 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  She reports that her temperature has gradually been increasing over the past day.  She denies any infectious symptoms including runny nose, cough, congestion, and vomiting, diarrhea, or dysuria.  She did report brief episode of right upper quadrant abdominal pain that has now resolved.  Patient is endorsing frontal headache that is gradually worsened throughout the day.  No alleviating or aggravating factors.  HPI  Past Medical History Past Medical History:  Diagnosis Date  . Anxiety   . Hypertension   . IBS (irritable bowel syndrome)    Patient Active Problem List   Diagnosis Date Noted  . Antineoplastic chemotherapy induced pancytopenia ([place]) 07/01/2019  . Pancytopenia due to antineoplastic chemotherapy ([place]) 07/01/2019  . Headache 07/01/2019  . Macrocytic anemia 05/01/2019  . AML (acute myeloblastic leukemia) ([place]) 05/01/2019  . Neck pain on right side 05/01/2019  . Leukocytosis 05/01/2019  . Bicytopenia 04/30/2019  . Macrocytosis without anemia 02/12/2019  . Family history of hemochromatosis 02/12/2019  . Mixed hyperlipidemia 01/18/2019  . Abnormal LFTs 01/18/2019  . Dandruff in adult 01/18/2019  . Hair loss 01/18/2019  . Muscle weakness 01/18/2019  . Peripheral neuropathy 01/18/2019  . Other fatigue 01/18/2019  . Essential hypertension 06/01/2018  . Palpitations 06/01/2018  . Anxiety disorder 06/01/2018  . Insomnia 06/01/2018  . Irritable bowel syndrome with diarrhea 06/01/2018  . Barrett's esophagus with dysplasia 06/01/2018   Home Medication(s) Prior to Admission medications   Medication Sig Start Date End Date Taking? Authorizing  Provider  acyclovir (ZOVIRAX) 200 MG capsule Take by mouth. 05/31/19   [provider]  cyclobenzaprine (FLEXERIL) 5 MG tablet Take 1 tablet (5 mg total) by mouth 3 (three) times daily as needed for muscle spasms. Patient not taking: Reported on 06/24/2019 05/01/19   Vernelle Emerald, MD  dicyclomine (BENTYL) 10 MG capsule Take by mouth. 05/31/19 05/30/20  [provider]  entecavir (BARACLUDE) 0.5 MG tablet Take by mouth. 06/01/19 05/31/20  [provider]  escitalopram (LEXAPRO) 10 MG tablet Take by mouth. 06/19/19 09/17/19  [provider]  folic acid (FOLVITE) 1 MG tablet Take by mouth. 06/01/19 05/31/20  [provider]  gabapentin (NEURONTIN) 300 MG capsule Take by mouth. 06/20/19 07/21/19  [provider]  hydrochlorothiazide (HYDRODIURIL) 25 MG tablet Take by mouth. 06/01/19 05/31/20  [provider]  LORazepam (ATIVAN) 1 MG tablet Take by mouth. 06/18/19   [provider]  losartan (COZAAR) 25 MG tablet Take 1 tablet (25 mg total) by mouth daily. 05/02/19   Shalhoub, Sherryll Burger, MD  omeprazole (PRILOSEC OTC) 20 MG tablet Take 20 mg by mouth daily.    [provider]  ondansetron (ZOFRAN) 4 MG tablet Take 1 tablet (4 mg total) by mouth every 6 (six) hours as needed for nausea. 05/01/19   Shalhoub, Sherryll Burger, MD  potassium chloride SA (KLOR-CON) 20 MEQ tablet Take by mouth. 06/18/19 07/18/19  [provider]  traMADol (ULTRAM) 50 MG tablet Take 1 tablet (50 mg total) by mouth every 12 (twelve) hours as needed for moderate pain or severe pain. 05/01/19   Shalhoub, Sherryll Burger, MD  traZODone (DESYREL) 50 MG tablet Take by mouth. 05/31/19   [provider]  VITAMIN D, CHOLECALCIFEROL, PO Take by mouth.    [provider]                                                                                                                                    Past Surgical History Past Surgical History:  Procedure  Laterality Date  . GASTRIC BYPASS  2013  . NISSEN FUNDOPLICATION  2952   Reversed in 2013  . ORIF ANKLE FRACTURE Left 06/2017  . PLACEMENT OF BREAST IMPLANTS     Family History Family History  Problem Relation Age of Onset  . Pancreatic cancer Father   . Colon cancer Maternal Aunt   . Diabetes Maternal Uncle   . Pancreatic cancer Maternal Grandfather     Social History Social History   Tobacco Use  . Smoking status: Former Research scientist (life sciences)  . Smokeless tobacco: Never Used  Substance Use Topics  . Alcohol use: Not Currently    Comment: occasionally   . Drug use: Not Currently   Allergies Lisinopril  Review of Systems Review of Systems All other systems are reviewed and are negative for acute change except as noted in the HPI  Physical Exam Vital Signs  I have reviewed the triage vital signs BP 127/68 (BP Location: Right Arm)   Pulse 97   Temp 99.5 F (37.5 C) (Oral)   Resp 18   Ht 5\' 8"  (1.727 m)   Wt 88.8 kg   SpO2 98%   BMI 29.76 kg/m   Physical Exam Vitals reviewed.  Constitutional:      General: She is not in acute distress.    Appearance: She is well-developed. She is not diaphoretic.  HENT:     Head: Normocephalic and atraumatic.     Right Ear: Tympanic membrane normal.     Left Ear: Tympanic membrane normal.     Nose: Nose normal.     Mouth/Throat:     Pharynx: Oropharynx is clear.   Eyes:     General: No scleral icterus.       Right eye: No discharge.        Left eye: No discharge.     Conjunctiva/sclera: Conjunctivae normal.     Pupils: Pupils are equal, round, and reactive to light.  Cardiovascular:     Rate and Rhythm: Normal rate and regular rhythm.     Heart sounds: No murmur heard.  No friction rub. No gallop.   Pulmonary:     Effort: Pulmonary effort is normal. No respiratory distress.     Breath sounds: Normal breath sounds. No stridor. No rales.  Abdominal:     General: There is no distension.     Palpations: Abdomen is soft.      Tenderness: There is no abdominal tenderness. There is no guarding or rebound.  Musculoskeletal:        General: No tenderness.     Cervical back: Normal range  of motion and neck supple.  Skin:    General: Skin is warm and dry.     Findings: No erythema or rash.  Neurological:     Mental Status: She is alert and oriented to person, place, and time.     ED Results and Treatments Labs (all labs ordered are listed, but only abnormal results are displayed) Labs Reviewed  COMPREHENSIVE METABOLIC PANEL - Abnormal; Notable for the following components:      Result Value   Glucose, Bld 122 (*)    Total Protein 6.2 (*)    Albumin 3.4 (*)    Total Bilirubin 1.4 (*)    All other components within normal limits  CBC WITH DIFFERENTIAL/PLATELET - Abnormal; Notable for the following components:   WBC 0.6 (*)    RBC 2.98 (*)    Hemoglobin 9.3 (*)    HCT 27.7 (*)    RDW 15.9 (*)    Platelets <5 (*)    Neutro Abs 0.0 (*)    Lymphs Abs 0.5 (*)    All other components within normal limits  URINALYSIS, ROUTINE W REFLEX MICROSCOPIC - Abnormal; Notable for the following components:   Bacteria, UA RARE (*)    All other components within normal limits  I-STAT CHEM 8, ED - Abnormal; Notable for the following components:   Potassium 3.4 (*)    Chloride 97 (*)    BUN 4 (*)    Creatinine, Ser 0.40 (*)    Glucose, Bld 122 (*)    Hemoglobin 8.8 (*)    HCT 26.0 (*)    All other components within normal limits  SARS CORONAVIRUS 2 BY RT PCR (HOSPITAL ORDER, [facility])  CULTURE, BLOOD (ROUTINE X 2)  CULTURE, BLOOD (ROUTINE X 2)  URINE CULTURE  LACTIC ACID, PLASMA  APTT  PROTIME-INR  LIPASE, BLOOD  PROCALCITONIN  TYPE AND SCREEN  PREPARE RBC (CROSSMATCH)                                                                                                                         EKG  EKG Interpretation  Date/Time:  Monday July 01 2019 00:20:59 EDT Ventricular Rate:   79 PR Interval:    QRS Duration: 97 QT Interval:  399 QTC Calculation: 458 R Axis:   55 Text Interpretation: Sinus rhythm NO STEMI. No old tracing to compare Confirmed by Addison Lank [phone]) on 07/01/2019 2:03:36 AM      Radiology CT HEAD WO CONTRAST  Result Date: 07/01/2019 CLINICAL DATA:  Headache with intracranial hemorrhage suspected. Pancytopenia. EXAM: CT HEAD WITHOUT CONTRAST TECHNIQUE: Contiguous axial images were obtained from the base of the skull through the vertex without intravenous contrast. COMPARISON:  None. FINDINGS: Brain: No evidence of acute cortical infarction, hemorrhage, hydrocephalus, extra-axial collection or mass lesion/mass effect. 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 Vascular: No hyperdense vessel or unexpected calcification. Skull: Normal. Negative for fracture or  focal lesion. Sinuses/Orbits: Negative.  No visualized sinusitis. IMPRESSION: No acute finding.  Negative for intracranial hemorrhage. Electronically Signed   By: Monte Fantasia M.D.   On: 07/01/2019 05:23   DG Chest Port 1 View  Result Date: 06/30/2019 CLINICAL DATA:  Fever. EXAM: PORTABLE CHEST 1 VIEW COMPARISON:  None. FINDINGS: The heart size and mediastinal contours are within normal limits. Both lungs are clear. The visualized skeletal structures are unremarkable. IMPRESSION: No active disease. Electronically Signed   By: Constance Holster M.D.   On: 06/30/2019 23:49    Pertinent labs & imaging results that were available during my care of the patient were reviewed by me and considered in my medical decision making (see chart for details).  Medications Ordered in ED Medications  sodium chloride 0.9 % bolus 1,000 mL (0 mLs Intravenous Stopped 07/01/19 0156)    Followed by  0.9 %  sodium chloride infusion (1,000 mLs Intravenous New Bag/Given (Non-Interop) 07/01/19 0156)  acetaminophen (TYLENOL) tablet 1,000 mg (1,000 mg Oral Refused  07/01/19 0126)  acyclovir (ZOVIRAX) 200 MG capsule 200 mg (has no administration in time range)  entecavir (BARACLUDE) tablet 0.5 mg (has no administration in time range)  hydrochlorothiazide (HYDRODIURIL) tablet 25 mg (has no administration in time range)  losartan (COZAAR) tablet 25 mg (has no administration in time range)  escitalopram (LEXAPRO) tablet 10 mg (has no administration in time range)  LORazepam (ATIVAN) tablet 1 mg (has no administration in time range)  traZODone (DESYREL) tablet 50 mg (has no administration in time range)  omeprazole (PRILOSEC OTC) EC tablet 20 mg (has no administration in time range)  ondansetron (ZOFRAN) tablet 4 mg (has no administration in time range)  dicyclomine (BENTYL) capsule 10 mg (has no administration in time range)  folic acid (FOLVITE) tablet 1 mg (has no administration in time range)  potassium chloride SA (KLOR-CON) CR tablet 20 mEq (has no administration in time range)  0.9 %  sodium chloride infusion (Manually program via Guardrails IV Fluids) (has no administration in time range)  heparin injection 5,000 Units (has no administration in time range)  0.9 %  sodium chloride infusion (has no administration in time range)  traMADol (ULTRAM) tablet 50 mg (has no administration in time range)  gabapentin (NEURONTIN) capsule 900 mg (900 mg Oral Given 07/01/19 0515)  fentaNYL (SUBLIMAZE) injection 50 mcg (50 mcg Intravenous Given 07/01/19 0122)  fentaNYL (SUBLIMAZE) injection 50 mcg (50 mcg Intravenous Given 07/01/19 0359)                                                                                                                                    Procedures .Critical Care Performed by: Fatima Blank, MD Authorized by: Fatima Blank, MD    CRITICAL CARE Performed by: Grayce Sessions Callen Vancuren Total critical care time: 50 minutes Critical care time was exclusive of separately billable procedures and treating other  patients. Critical care was  necessary to treat or prevent imminent or life-threatening deterioration. Critical care was time spent personally by me on the following activities: development of treatment plan with patient and/or surrogate as well as nursing, discussions with consultants, evaluation of patient's response to treatment, examination of patient, obtaining history from patient or surrogate, ordering and performing treatments and interventions, ordering and review of laboratory studies, ordering and review of radiographic studies, pulse oximetry and re-evaluation of patient's condition.    (including critical care time)  Medical Decision Making / ED Course I have reviewed the nursing notes for this encounter and the patient's prior records (if available in EHR or on provided paperwork).   Kelly Roberson was evaluated in Emergency Department on 07/01/2019 for the symptoms described in the history of present illness. She was evaluated in the context of the global COVID-19 pandemic, which necessitated consideration that the patient might be at risk for infection with the SARS-CoV-2 virus that causes COVID-19. Institutional protocols and algorithms that pertain to the evaluation of patients at risk for COVID-19 are in a state of rapid change based on information released by regulatory bodies including the CDC and federal and state organizations. These policies and algorithms were followed during the patient's care in the ED.  Currently patient is afebrile stable vital signs.  She did not take any antipyretics prior to coming. Obvious infectious symptoms.  Abdomen benign.  Lungs clear to auscultation.  No signs of acute otitis media or pharyngitis on exam.  No signs of meningitis.  Infectious work-up initiated. Chest x-ray without evidence of pneumonia UA negative.  Metabolic panel is reassuring.  CBC notable for worsening pancytopenia.  Patient was scheduled for transfusion later on today.   Spoke with the on-call oncologist who recommended admitting the patient for transfusion and close monitoring.  He recommended not initiating antibiotics at this time.  He will discuss the case with [name], the patient's oncologist.  Patient admitted to medicine      Final Clinical Impression(s) / ED Diagnoses Final diagnoses:  Pancytopenia ([place])      This chart was dictated using voice recognition software.  Despite best efforts to proofread,  errors can occur which can change the documentation meaning.   Fatima Blank, MD 07/01/19 [phone]

## 2019-07-01 ENCOUNTER — Inpatient Hospital Stay: Payer: Medicaid Other

## 2019-07-01 ENCOUNTER — Observation Stay (HOSPITAL_COMMUNITY): Payer: Medicaid Other

## 2019-07-01 DIAGNOSIS — Z79899 Other long term (current) drug therapy: Secondary | ICD-10-CM | POA: Diagnosis not present

## 2019-07-01 DIAGNOSIS — R519 Headache, unspecified: Secondary | ICD-10-CM | POA: Diagnosis present

## 2019-07-01 DIAGNOSIS — D709 Neutropenia, unspecified: Secondary | ICD-10-CM

## 2019-07-01 DIAGNOSIS — T451X5A Adverse effect of antineoplastic and immunosuppressive drugs, initial encounter: Secondary | ICD-10-CM | POA: Diagnosis present

## 2019-07-01 DIAGNOSIS — R5081 Fever presenting with conditions classified elsewhere: Secondary | ICD-10-CM | POA: Diagnosis present

## 2019-07-01 DIAGNOSIS — F329 Major depressive disorder, single episode, unspecified: Secondary | ICD-10-CM | POA: Diagnosis present

## 2019-07-01 DIAGNOSIS — Z800 Family history of malignant neoplasm of digestive organs: Secondary | ICD-10-CM | POA: Diagnosis not present

## 2019-07-01 DIAGNOSIS — K219 Gastro-esophageal reflux disease without esophagitis: Secondary | ICD-10-CM | POA: Diagnosis present

## 2019-07-01 DIAGNOSIS — F419 Anxiety disorder, unspecified: Secondary | ICD-10-CM | POA: Diagnosis present

## 2019-07-01 DIAGNOSIS — E782 Mixed hyperlipidemia: Secondary | ICD-10-CM | POA: Diagnosis present

## 2019-07-01 DIAGNOSIS — K589 Irritable bowel syndrome without diarrhea: Secondary | ICD-10-CM | POA: Diagnosis present

## 2019-07-01 DIAGNOSIS — Z87891 Personal history of nicotine dependence: Secondary | ICD-10-CM | POA: Diagnosis not present

## 2019-07-01 DIAGNOSIS — G629 Polyneuropathy, unspecified: Secondary | ICD-10-CM | POA: Diagnosis present

## 2019-07-01 DIAGNOSIS — Z9884 Bariatric surgery status: Secondary | ICD-10-CM | POA: Diagnosis not present

## 2019-07-01 DIAGNOSIS — E876 Hypokalemia: Secondary | ICD-10-CM | POA: Diagnosis present

## 2019-07-01 DIAGNOSIS — D701 Agranulocytosis secondary to cancer chemotherapy: Secondary | ICD-10-CM | POA: Diagnosis present

## 2019-07-01 DIAGNOSIS — D61810 Antineoplastic chemotherapy induced pancytopenia: Secondary | ICD-10-CM | POA: Diagnosis present

## 2019-07-01 DIAGNOSIS — I10 Essential (primary) hypertension: Secondary | ICD-10-CM | POA: Diagnosis present

## 2019-07-01 DIAGNOSIS — C9200 Acute myeloblastic leukemia, not having achieved remission: Secondary | ICD-10-CM | POA: Diagnosis present

## 2019-07-01 DIAGNOSIS — Z20822 Contact with and (suspected) exposure to covid-19: Secondary | ICD-10-CM | POA: Diagnosis present

## 2019-07-01 LAB — PROTIME-INR
INR: 1.1 (ref 0.8–1.2)
Prothrombin Time: 13.3 seconds (ref 11.4–15.2)

## 2019-07-01 LAB — URINALYSIS, ROUTINE W REFLEX MICROSCOPIC
Bilirubin Urine: NEGATIVE
Glucose, UA: NEGATIVE mg/dL
Hgb urine dipstick: NEGATIVE
Ketones, ur: NEGATIVE mg/dL
Leukocytes,Ua: NEGATIVE
Nitrite: NEGATIVE
Protein, ur: NEGATIVE mg/dL
Specific Gravity, Urine: 1.006 (ref 1.005–1.030)
pH: 7 (ref 5.0–8.0)

## 2019-07-01 LAB — CBC WITH DIFFERENTIAL/PLATELET
Abs Immature Granulocytes: 0 10*3/uL (ref 0.00–0.07)
Basophils Absolute: 0 10*3/uL (ref 0.0–0.1)
Basophils Relative: 2 %
Eosinophils Absolute: 0 10*3/uL (ref 0.0–0.5)
Eosinophils Relative: 0 %
HCT: 27.7 % — ABNORMAL LOW (ref 36.0–46.0)
Hemoglobin: 9.3 g/dL — ABNORMAL LOW (ref 12.0–15.0)
Immature Granulocytes: 0 %
Lymphocytes Relative: 86 %
Lymphs Abs: 0.5 10*3/uL — ABNORMAL LOW (ref 0.7–4.0)
MCH: 31.2 pg (ref 26.0–34.0)
MCHC: 33.6 g/dL (ref 30.0–36.0)
MCV: 93 fL (ref 80.0–100.0)
Monocytes Absolute: 0.1 10*3/uL (ref 0.1–1.0)
Monocytes Relative: 9 %
Neutro Abs: 0 10*3/uL — ABNORMAL LOW (ref 1.7–7.7)
Neutrophils Relative %: 3 %
Platelets: <5 10*3/uL — CL (ref 150–400)
RBC: 2.98 MIL/uL — ABNORMAL LOW (ref 3.87–5.11)
RDW: 15.9 % — ABNORMAL HIGH (ref 11.5–15.5)
WBC: 0.6 10*3/uL — CL (ref 4.0–10.5)
nRBC: 0 % (ref 0.0–0.2)

## 2019-07-01 LAB — I-STAT CHEM 8, ED
BUN: 4 mg/dL — ABNORMAL LOW (ref 6–20)
Calcium, Ion: 1.23 mmol/L (ref 1.15–1.40)
Chloride: 97 mmol/L — ABNORMAL LOW (ref 98–111)
Creatinine, Ser: 0.4 mg/dL — ABNORMAL LOW (ref 0.44–1.00)
Glucose, Bld: 122 mg/dL — ABNORMAL HIGH (ref 70–99)
HCT: 26 % — ABNORMAL LOW (ref 36.0–46.0)
Hemoglobin: 8.8 g/dL — ABNORMAL LOW (ref 12.0–15.0)
Potassium: 3.4 mmol/L — ABNORMAL LOW (ref 3.5–5.1)
Sodium: 136 mmol/L (ref 135–145)
TCO2: 27 mmol/L (ref 22–32)

## 2019-07-01 LAB — COMPREHENSIVE METABOLIC PANEL
ALT: 24 U/L (ref 0–44)
AST: 23 U/L (ref 15–41)
Albumin: 3.4 g/dL — ABNORMAL LOW (ref 3.5–5.0)
Alkaline Phosphatase: 102 U/L (ref 38–126)
Anion gap: 11 (ref 5–15)
BUN: 7 mg/dL (ref 6–20)
CO2: 27 mmol/L (ref 22–32)
Calcium: 9 mg/dL (ref 8.9–10.3)
Chloride: 98 mmol/L (ref 98–111)
Creatinine, Ser: 0.57 mg/dL (ref 0.44–1.00)
GFR calc Af Amer: >60 mL/min (ref >60)
GFR calc non Af Amer: >60 mL/min (ref >60)
Glucose, Bld: 122 mg/dL — ABNORMAL HIGH (ref 70–99)
Potassium: 3.5 mmol/L (ref 3.5–5.1)
Sodium: 136 mmol/L (ref 135–145)
Total Bilirubin: 1.4 mg/dL — ABNORMAL HIGH (ref 0.3–1.2)
Total Protein: 6.2 g/dL — ABNORMAL LOW (ref 6.5–8.1)

## 2019-07-01 LAB — LACTIC ACID, PLASMA: Lactic Acid, Venous: 1.7 mmol/L (ref 0.5–1.9)

## 2019-07-01 LAB — LIPASE, BLOOD: Lipase: 18 U/L (ref 11–51)

## 2019-07-01 LAB — SARS CORONAVIRUS 2 BY RT PCR (HOSPITAL ORDER, PERFORMED IN ~~LOC~~ HOSPITAL LAB): SARS Coronavirus 2: NEGATIVE

## 2019-07-01 LAB — PROCALCITONIN: Procalcitonin: <0.1 ng/mL

## 2019-07-01 LAB — PREPARE RBC (CROSSMATCH)

## 2019-07-01 LAB — APTT: aPTT: 32 seconds (ref 24–36)

## 2019-07-01 MED ORDER — ACYCLOVIR 200 MG PO CAPS: 200.0000 mg | ORAL_CAPSULE | Freq: Two times a day (BID) | ORAL | Status: DC

## 2019-07-01 MED ORDER — FENTANYL CITRATE (PF) 100 MCG/2ML IJ SOLN
50.0000 ug | Freq: Once | INTRAMUSCULAR | Status: AC
  Administered 2019-07-01: 50 ug via INTRAVENOUS
  Filled 2019-07-01: qty 2

## 2019-07-01 MED ORDER — ENTECAVIR 0.5 MG PO TABS: 0.5000 mg | ORAL_TABLET | Freq: Every day | ORAL | Status: DC

## 2019-07-01 MED ORDER — ESCITALOPRAM OXALATE 10 MG PO TABS: 10.0000 mg | ORAL_TABLET | Freq: Every day | ORAL | Status: DC

## 2019-07-01 MED ORDER — OXYCODONE HCL 5 MG PO TABS
5.0000 mg | ORAL_TABLET | ORAL | Status: DC | PRN
  Administered 2019-07-01 – 2019-07-02 (×2): 5 mg via ORAL
  Filled 2019-07-01 (×3): qty 1

## 2019-07-01 MED ORDER — LOSARTAN POTASSIUM 50 MG PO TABS
25.0000 mg | ORAL_TABLET | Freq: Every day | ORAL | Status: DC
  Administered 2019-07-01 – 2019-07-03 (×3): 25 mg via ORAL
  Filled 2019-07-01 (×3): qty 1

## 2019-07-01 MED ORDER — SODIUM CHLORIDE 0.9 % IV SOLN: INTRAVENOUS | Status: DC

## 2019-07-01 MED ORDER — ACYCLOVIR 200 MG PO CAPS
400.0000 mg | ORAL_CAPSULE | Freq: Two times a day (BID) | ORAL | Status: DC
  Administered 2019-07-01 – 2019-07-03 (×4): 400 mg via ORAL
  Filled 2019-07-01 (×4): qty 2

## 2019-07-01 MED ORDER — SENNOSIDES-DOCUSATE SODIUM 8.6-50 MG PO TABS
2.0000 | ORAL_TABLET | Freq: Every day | ORAL | Status: DC
  Administered 2019-07-01: 2 via ORAL
  Filled 2019-07-01 (×2): qty 2

## 2019-07-01 MED ORDER — GABAPENTIN 400 MG PO CAPS
1200.0000 mg | ORAL_CAPSULE | Freq: Every day | ORAL | Status: DC
  Administered 2019-07-01 – 2019-07-02 (×2): 1200 mg via ORAL
  Filled 2019-07-01 (×2): qty 3

## 2019-07-01 MED ORDER — TRAMADOL HCL 50 MG PO TABS
50.0000 mg | ORAL_TABLET | Freq: Three times a day (TID) | ORAL | Status: DC | PRN
  Administered 2019-07-01 – 2019-07-02 (×3): 50 mg via ORAL
  Filled 2019-07-01 (×3): qty 1

## 2019-07-01 MED ORDER — LORAZEPAM 1 MG PO TABS
1.0000 mg | ORAL_TABLET | Freq: Four times a day (QID) | ORAL | Status: DC | PRN
  Administered 2019-07-02: 1 mg via ORAL
  Filled 2019-07-01: qty 1

## 2019-07-01 MED ORDER — HYDROCHLOROTHIAZIDE 25 MG PO TABS
25.0000 mg | ORAL_TABLET | Freq: Every day | ORAL | Status: DC
  Administered 2019-07-02 – 2019-07-03 (×2): 25 mg via ORAL
  Filled 2019-07-01 (×2): qty 1

## 2019-07-01 MED ORDER — GABAPENTIN 300 MG PO CAPS
900.0000 mg | ORAL_CAPSULE | Freq: Three times a day (TID) | ORAL | Status: DC
  Administered 2019-07-01: 900 mg via ORAL
  Filled 2019-07-01: qty 3

## 2019-07-01 MED ORDER — POLYETHYLENE GLYCOL 3350 17 G PO PACK: 17.0000 g | PACK | Freq: Every day | ORAL | Status: DC | PRN

## 2019-07-01 MED ORDER — SODIUM CHLORIDE 0.9 % IV SOLN: INTRAVENOUS | Status: AC

## 2019-07-01 MED ORDER — GABAPENTIN 300 MG PO CAPS: 300.0000 mg | ORAL_CAPSULE | Freq: Two times a day (BID) | ORAL | Status: DC

## 2019-07-01 MED ORDER — HEPARIN SODIUM (PORCINE) 5000 UNIT/ML IJ SOLN: 5000.0000 [IU] | Freq: Three times a day (TID) | INTRAMUSCULAR | Status: DC

## 2019-07-01 MED ORDER — ACETAMINOPHEN 500 MG PO TABS
1000.0000 mg | ORAL_TABLET | Freq: Once | ORAL | Status: DC
  Filled 2019-07-01 (×2): qty 2

## 2019-07-01 MED ORDER — LOSARTAN POTASSIUM 25 MG PO TABS: 25.0000 mg | ORAL_TABLET | Freq: Every day | ORAL | Status: DC

## 2019-07-01 MED ORDER — DICYCLOMINE HCL 10 MG PO CAPS: 10.0000 mg | ORAL_CAPSULE | Freq: Three times a day (TID) | ORAL | Status: DC

## 2019-07-01 MED ORDER — SODIUM CHLORIDE 0.9 % IV SOLN
2.0000 g | Freq: Three times a day (TID) | INTRAVENOUS | Status: DC
  Administered 2019-07-01 – 2019-07-02 (×5): 2 g via INTRAVENOUS
  Filled 2019-07-01 (×7): qty 2

## 2019-07-01 MED ORDER — FOLIC ACID 1 MG PO TABS: 1.0000 mg | ORAL_TABLET | Freq: Every day | ORAL | Status: DC

## 2019-07-01 MED ORDER — ONDANSETRON HCL 4 MG PO TABS
4.0000 mg | ORAL_TABLET | Freq: Four times a day (QID) | ORAL | Status: DC | PRN
  Administered 2019-07-01: 4 mg via ORAL
  Filled 2019-07-01: qty 1

## 2019-07-01 MED ORDER — POTASSIUM CHLORIDE CRYS ER 20 MEQ PO TBCR
20.0000 meq | EXTENDED_RELEASE_TABLET | Freq: Every day | ORAL | Status: DC
  Filled 2019-07-01: qty 1

## 2019-07-01 MED ORDER — SODIUM CHLORIDE 0.9% IV SOLUTION: Freq: Once | INTRAVENOUS | Status: AC

## 2019-07-01 MED ORDER — MORPHINE SULFATE (PF) 2 MG/ML IV SOLN
2.0000 mg | Freq: Once | INTRAVENOUS | Status: AC
  Administered 2019-07-01: 2 mg via INTRAVENOUS
  Filled 2019-07-01: qty 1

## 2019-07-01 MED ORDER — TRAMADOL HCL 50 MG PO TABS: 50.0000 mg | ORAL_TABLET | Freq: Two times a day (BID) | ORAL | Status: DC | PRN

## 2019-07-01 MED ORDER — HYDROCHLOROTHIAZIDE 25 MG PO TABS: 25.0000 mg | ORAL_TABLET | Freq: Every day | ORAL | Status: DC

## 2019-07-01 MED ORDER — TRAZODONE HCL 50 MG PO TABS: 50.0000 mg | ORAL_TABLET | Freq: Every evening | ORAL | Status: DC | PRN

## 2019-07-01 MED ORDER — GABAPENTIN 300 MG PO CAPS
900.0000 mg | ORAL_CAPSULE | Freq: Two times a day (BID) | ORAL | Status: DC
  Administered 2019-07-01 – 2019-07-03 (×4): 900 mg via ORAL
  Filled 2019-07-01 (×4): qty 3

## 2019-07-01 MED ORDER — TRAZODONE HCL 50 MG PO TABS: 50.0000 mg | ORAL_TABLET | Freq: Every day | ORAL | Status: DC

## 2019-07-01 MED ORDER — OMEPRAZOLE MAGNESIUM 20 MG PO TBEC: 20.0000 mg | DELAYED_RELEASE_TABLET | Freq: Every day | ORAL | Status: DC

## 2019-07-01 MED ORDER — TRAZODONE HCL 50 MG PO TABS
50.0000 mg | ORAL_TABLET | Freq: Every evening | ORAL | Status: DC | PRN
  Administered 2019-07-02: 50 mg via ORAL
  Filled 2019-07-01: qty 1

## 2019-07-01 MED ORDER — TBO-FILGRASTIM 480 MCG/0.8ML ~~LOC~~ SOSY: 480.0000 ug | PREFILLED_SYRINGE | Freq: Once | SUBCUTANEOUS | Status: DC

## 2019-07-01 MED ORDER — ESCITALOPRAM OXALATE 10 MG PO TABS
10.0000 mg | ORAL_TABLET | Freq: Every day | ORAL | Status: DC
  Administered 2019-07-02 – 2019-07-03 (×2): 10 mg via ORAL
  Filled 2019-07-01 (×3): qty 1

## 2019-07-01 MED ORDER — POTASSIUM CHLORIDE CRYS ER 20 MEQ PO TBCR: 40.0000 meq | EXTENDED_RELEASE_TABLET | Freq: Once | ORAL | Status: DC

## 2019-07-01 NOTE — Progress Notes (Addendum)
PROGRESS NOTE    Kelly Roberson  NWG:[id number] DOB: [birth year] DOA: 06/30/2019 PCP: Ladell Pier, MD   Brief Narrative:  59 year old with history of HTN, IBS, AML diagnosed 04/30/2019 treated at [facility] admitted to the hospital for fevers of 100.5.  Lab work showed severe pancytopenia/neutropenia.  Oncology consulted.   Assessment & Plan:   Active Problems:   Essential hypertension   Mixed hyperlipidemia   AML (acute myeloblastic leukemia) ([facility])   Antineoplastic chemotherapy induced pancytopenia ([facility])   Pancytopenia due to antineoplastic chemotherapy ([place])   Headache  Severe pancytopenia due to bone marrow suppression from chemotherapy -Status post 1 unit PRBC irradiated blood -Oncology consulted.  1 unit PRBC transfusion ordered.  1 dose Granix -Acyclovir twice daily, entecavir daily -IV fluids  Addendum 340pm: Per Onc who also discussed case with [facility] - Start Cefepime IV q8hrs, hold off on Granix. Monitor Cultures.   Fever, neutropenia -Panculture= pending -UA-negative -Chest x-ray-negative, procalcitonin-negative -Holding off on antibiotics  Generalized headaches -CT head-negative  Essential hypertension -HCTZ 25 mg daily, losartan 25 mg daily  Depression/anxiety -Lexapro 10 mg daily, trazodone nightly  Peripheral neuropathy -Gabapentin 900 mg 3 times daily, daily folic acid  GERD -PPI daily   DVT prophylaxis: heparin injection 5,000 Units Start: 07/01/19 0600 Code Status: Full code Family Communication: None  Status is: Inpatient   Dispo: The patient is from: Home              Anticipated d/c is to: Home              Anticipated d/c date is: 2 days              Patient currently is not medically stable to d/c.  Patient is quite neutropenic we will need to monitor cultures, give transfusion due to severe thrombocytopenia   Body mass index is 29.76 kg/m.  Consultants:   Oncology   Subjective: Has slight headache and feels dry mouth.   Denies any fever since being in the hospital.  Review of Systems Otherwise negative except as per HPI, including: General: Denies fever, chills, night sweats or unintended weight loss. Resp: Denies cough, wheezing, shortness of breath. Cardiac: Denies chest pain, palpitations, orthopnea, paroxysmal nocturnal dyspnea. GI: Denies abdominal pain, nausea, vomiting, diarrhea or constipation GU: Denies dysuria, frequency, hesitancy or incontinence MS: Denies muscle aches, joint pain or swelling Neuro: Denies headache, neurologic deficits (focal weakness, numbness, tingling), abnormal gait Psych: Denies anxiety, depression, SI/HI/AVH Skin: Denies new rashes or lesions ID: Denies sick contacts, exotic exposures, travel  Examination:  General exam: Appears calm and comfortable  Respiratory system: Clear to auscultation. Respiratory effort normal. Cardiovascular system: S1 & S2 heard, RRR. No JVD, murmurs, rubs, gallops or clicks. No pedal edema. Gastrointestinal system: Abdomen is nondistended, soft and nontender. No organomegaly or masses felt. Normal bowel sounds heard. Central nervous system: Alert and oriented. No focal neurological deficits. Extremities: Symmetric 5 x 5 power. Skin: No rashes, lesions or ulcers Psychiatry: Judgement and insight appear normal. Mood & affect appropriate.     Objective: Vitals:   07/01/19 0630 07/01/19 0700 07/01/19 0731 07/01/19 0759  BP: (!) 142/90 130/67 130/67   Pulse: 82 73 72   Resp: 17 (!) 25 (!) 24   Temp:      TempSrc:    Oral  SpO2: 98% 96% 97%   Weight:      Height:        Intake/Output Summary (Last 24 hours) at 07/01/2019 5784 Last data filed at  07/01/2019 0156 Gross per 24 hour  Intake 1000 ml  Output --  Net 1000 ml   Filed Weights   06/30/19 2308  Weight: 88.8 kg     Data Reviewed:   CBC: Recent Labs  Lab 06/24/19 1044 06/27/19 1109 06/30/19 2326 07/01/19 0040  WBC 0.9* 0.4* 0.6*  --   NEUTROABS 0.5* 0.0* 0.0*   --   HGB 11.7* 11.4* 9.3* 8.8*  HCT 36.3 34.2* 27.7* 26.0*  MCV 98.4 95.5 93.0  --   PLT 76* 6* <5*  --    Basic Metabolic Panel: Recent Labs  Lab 06/24/19 1044 06/27/19 1109 06/30/19 2326 07/01/19 0040  NA 140 140 136 136  K 3.9 3.7 3.5 3.4*  CL 102 102 98 97*  CO2 28 25 27   --   GLUCOSE 107* 100* 122* 122*  BUN 13 12 7  4*  CREATININE 0.64 0.61 0.57 0.40*  CALCIUM 9.6 9.9 9.0  --    GFR: Estimated Creatinine Clearance: 87.2 mL/min (A) (by C-G formula based on SCr of 0.4 mg/dL (L)). Liver Function Tests: Recent Labs  Lab 06/24/19 1044 06/27/19 1109 06/30/19 2326  AST 61* 36 23  ALT 50* 37 24  ALKPHOS 85 108 102  BILITOT 1.0 1.4* 1.4*  PROT 6.3* 6.7 6.2*  ALBUMIN 3.3* 3.5 3.4*   Recent Labs  Lab 06/30/19 2326  LIPASE 18   No results for input(s): AMMONIA in the last 168 hours. Coagulation Profile: Recent Labs  Lab 06/30/19 2326  INR 1.1   Cardiac Enzymes: No results for input(s): CKTOTAL, CKMB, CKMBINDEX, TROPONINI in the last 168 hours. BNP (last 3 results) No results for input(s): PROBNP in the last 8760 hours. HbA1C: No results for input(s): HGBA1C in the last 72 hours. CBG: No results for input(s): GLUCAP in the last 168 hours. Lipid Profile: No results for input(s): CHOL, HDL, LDLCALC, TRIG, CHOLHDL, LDLDIRECT in the last 72 hours. Thyroid Function Tests: No results for input(s): TSH, T4TOTAL, FREET4, T3FREE, THYROIDAB in the last 72 hours. Anemia Panel: No results for input(s): VITAMINB12, FOLATE, FERRITIN, TIBC, IRON, RETICCTPCT in the last 72 hours. Sepsis Labs: Recent Labs  Lab 06/30/19 2326  PROCALCITON <0.10  LATICACIDVEN 1.7    Recent Results (from the past 240 hour(s))  SARS Coronavirus 2 by RT PCR (hospital order, performed in [facility] hospital lab) Nasopharyngeal Nasopharyngeal Swab     Status: None   Collection Time: 07/01/19  3:16 AM   Specimen: Nasopharyngeal Swab  Result Value Ref Range Status   SARS Coronavirus 2  NEGATIVE NEGATIVE Final    Comment: (NOTE) SARS-CoV-2 target nucleic acids are NOT DETECTED.  The SARS-CoV-2 RNA is generally detectable in upper and lower respiratory specimens during the acute phase of infection. The lowest concentration of SARS-CoV-2 viral copies this assay can detect is 250 copies / mL. A negative result does not preclude SARS-CoV-2 infection and should not be used as the sole basis for treatment or other patient management decisions.  A negative result may occur with improper specimen collection / handling, submission of specimen other than nasopharyngeal swab, presence of viral mutation(s) within the areas targeted by this assay, and inadequate number of viral copies (<250 copies / mL). A negative result must be combined with clinical observations, patient history, and epidemiological information.  Fact Sheet for Patients:   StrictlyIdeas.no  Fact Sheet for Healthcare Providers: BankingDealers.co.za  This test is not yet approved or  cleared by the [place] FDA and has been authorized for  detection and/or diagnosis of SARS-CoV-2 by FDA under an Emergency Use Authorization (EUA).  This EUA will remain in effect (meaning this test can be used) for the duration of the COVID-19 declaration under Section 564(b)(1) of the Act, 21 U.S.C. section 360bbb-3(b)(1), unless the authorization is terminated or revoked sooner.  Performed at [facility], [place] [address], [place], [place] [address]          Radiology Studies: CT HEAD WO CONTRAST  Result Date: 07/01/2019 CLINICAL DATA:  Headache with intracranial hemorrhage suspected. Pancytopenia. EXAM: CT HEAD WITHOUT CONTRAST TECHNIQUE: Contiguous axial images were obtained from the base of the skull through the vertex without intravenous contrast. COMPARISON:  None. FINDINGS: Brain: No evidence of acute cortical infarction, hemorrhage,  hydrocephalus, extra-axial collection or mass lesion/mass effect. 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 Vascular: No hyperdense vessel or unexpected calcification. Skull: Normal. Negative for fracture or focal lesion. Sinuses/Orbits: Negative.  No visualized sinusitis. IMPRESSION: No acute finding.  Negative for intracranial hemorrhage. Electronically Signed   By: Monte Fantasia M.D.   On: 07/01/2019 05:23   DG Chest Port 1 View  Result Date: 06/30/2019 CLINICAL DATA:  Fever. EXAM: PORTABLE CHEST 1 VIEW COMPARISON:  None. FINDINGS: The heart size and mediastinal contours are within normal limits. Both lungs are clear. The visualized skeletal structures are unremarkable. IMPRESSION: No active disease. Electronically Signed   By: Constance Holster M.D.   On: 06/30/2019 23:49        Scheduled Meds: . acetaminophen  1,000 mg Oral Once  . acyclovir  200 mg Oral BID  . dicyclomine  10 mg Oral TID AC  . entecavir  0.5 mg Oral Daily  . escitalopram  10 mg Oral Daily  . folic acid  1 mg Oral Daily  . gabapentin  900 mg Oral TID  . heparin  5,000 Units Subcutaneous Q8H  . hydrochlorothiazide  25 mg Oral Daily  . losartan  25 mg Oral Daily  . omeprazole  20 mg Oral Daily  . potassium chloride SA  20 mEq Oral Daily  . traZODone  50 mg Oral QHS   Continuous Infusions: . sodium chloride 1,000 mL (07/01/19 0156)  . sodium chloride       LOS: 0 days   Time spent= 35 mins    Jomes Giraldo Arsenio Loader, MD [facility]  If 7PM-7AM, please contact night-coverage  07/01/2019, 8:06 AM

## 2019-07-01 NOTE — H&P (Signed)
History and Physical    Manhattan Mccuen FBP:[id number] DOB: 1961-01-01 DOA: 06/30/2019  PCP: Ladell Pier, MD (Confirm with patient/family/NH records and if not entered, this has to be entered at [facility] point of entry) Patient coming from: home  I have personally briefly reviewed patient's old medical records in [place]  Chief Complaint: fever  HPI: Kelly Roberson is a 59 y.o. female with medical history significant of HTN, IBS, AML diagnosed 04/30/19. Treated at [facility] 05/04/19 - induction therapy. 05/16/19 BMBx - clear. 06/17/19 1st cycle coonsolidation therapy. 06/24/19- seen [place] for co-management, 06/27/19 pancytopenic with Plt 6 - came to platlet transfusion. Plan was for her to return to [facility] 07/01/19 for probable RBC transfusion with dropping Hgb. At home th day of adm she had fevers to 100.5 and presented to [facility] for evaluation.   ED Course: T 99.5 BP stable. Lab with WBC 0.6, Hgb 9.3 to 8.8, plts < 5. CXR NASD, U/A negative. No apparent source of infection. EDP consulted oncology on call who recommended patient be brought in on observation for RBC transfusion with f/u by [name] for oncology. [facility] called to admit patient.   Review of Systems: As per HPI otherwise 10 point review of systems negative. Heaache over past 2 days whole head, jaw and radiating to shoulders.    Past Medical History:  Diagnosis Date  . Anxiety   . Hypertension   . IBS (irritable bowel syndrome)     Past Surgical History:  Procedure Laterality Date  . GASTRIC BYPASS  2013  . NISSEN FUNDOPLICATION  8242   Reversed in 2013  . ORIF ANKLE FRACTURE Left 06/2017  . PLACEMENT OF BREAST IMPLANTS     Soc Hx -  Marriage ended in divorce. She has two sons: one in [place], one in [place]. She lives alone. Previously worked as a Sales executive, Forensic psychologist, worked in a Fish farm manager. Currently on disability.   reports that she has quit smoking. She has never used smokeless tobacco. She  reports previous alcohol use. She reports previous drug use.  Allergies  Allergen Reactions  . Lisinopril Cough    Family History  Problem Relation Age of Onset  . Pancreatic cancer Father   . Colon cancer Maternal Aunt   . Diabetes Maternal Uncle   . Pancreatic cancer Maternal Grandfather      Prior to Admission medications   Medication Sig Start Date End Date Taking? Authorizing Provider  acyclovir (ZOVIRAX) 200 MG capsule Take by mouth. 05/31/19   [provider]  cyclobenzaprine (FLEXERIL) 5 MG tablet Take 1 tablet (5 mg total) by mouth 3 (three) times daily as needed for muscle spasms. Patient not taking: Reported on 06/24/2019 05/01/19   Vernelle Emerald, MD  dicyclomine (BENTYL) 10 MG capsule Take by mouth. 05/31/19 05/30/20  [provider]  entecavir (BARACLUDE) 0.5 MG tablet Take by mouth. 06/01/19 05/31/20  [provider]  escitalopram (LEXAPRO) 10 MG tablet Take by mouth. 06/19/19 09/17/19  [provider]  folic acid (FOLVITE) 1 MG tablet Take by mouth. 06/01/19 05/31/20  [provider]  gabapentin (NEURONTIN) 300 MG capsule Take by mouth. 06/20/19 07/21/19  [provider]  hydrochlorothiazide (HYDRODIURIL) 25 MG tablet Take by mouth. 06/01/19 05/31/20  [provider]  LORazepam (ATIVAN) 1 MG tablet Take by mouth. 06/18/19   [provider]  losartan (COZAAR) 25 MG tablet Take 1 tablet (25 mg total) by mouth daily. 05/02/19   Shalhoub, Sherryll Burger,  MD  omeprazole (PRILOSEC OTC) 20 MG tablet Take 20 mg by mouth daily.    [provider]  ondansetron (ZOFRAN) 4 MG tablet Take 1 tablet (4 mg total) by mouth every 6 (six) hours as needed for nausea. 05/01/19   Shalhoub, Sherryll Burger, MD  potassium chloride SA (KLOR-CON) 20 MEQ tablet Take by mouth. 06/18/19 07/18/19  [provider]  traMADol (ULTRAM) 50 MG tablet Take 1 tablet (50 mg total) by mouth every 12 (twelve) hours as needed for moderate pain or  severe pain. 05/01/19   Shalhoub, Sherryll Burger, MD  traZODone (DESYREL) 50 MG tablet Take by mouth. 05/31/19   [provider]  VITAMIN D, CHOLECALCIFEROL, PO Take by mouth.    [provider]    Physical Exam: Vitals:   07/01/19 0230 07/01/19 0300 07/01/19 0330 07/01/19 0400  BP: 137/61 133/65 129/81 134/72  Pulse: 78 78 79 80  Resp: 19 (!) 21 18 15   Temp:      TempSrc:      SpO2: 95% 94% 97% 100%  Weight:      Height:        Constitutional: NAD, calm, comfortable Vitals:   07/01/19 0230 07/01/19 0300 07/01/19 0330 07/01/19 0400  BP: 137/61 133/65 129/81 134/72  Pulse: 78 78 79 80  Resp: 19 (!) 21 18 15   Temp:      TempSrc:      SpO2: 95% 94% 97% 100%  Weight:      Height:       General: pleasant woman in no distress Eyes: PERRL, lids and conjunctivae normal ENMT: Mucous membranes are moist. Posterior pharynx clear of any exudate or lesions.Normal dentition.  Neck: normal, supple, no masses, no thyromegaly Respiratory: clear to auscultation bilaterally, no wheezing, no crackles. Normal respiratory effort. No accessory muscle use.  Cardiovascular: Regular rate and rhythm, no murmurs / rubs / gallops. No extremity edema. 2+ pedal pulses. No carotid bruits.  Abdomen: obese, soft,mild diffuse tenderness to palpation, no masses palpated. No hepatosplenomegaly. Bowel sounds hypoactive.  Musculoskeletal: no clubbing / cyanosis. No joint deformity upper and lower extremities, but very high arches noted. Good ROM, no contractures. Normal muscle tone.  Skin: chemo related alopecia, no rashes, lesions, ulcers, bruising. No induration Neurologic: CN 2-12 grossly intact. Sensation diminished both feet. Strength 5/5 in all 4.  Psychiatric: Normal judgment and insight. Alert and oriented x 3. Normal mood.     Labs on Admission: I have personally reviewed following labs and imaging studies  CBC: Recent Labs  Lab 06/24/19 1044 06/27/19 1109 06/30/19 2326 07/01/19 0040    WBC 0.9* 0.4* 0.6*  --   NEUTROABS 0.5* 0.0* 0.0*  --   HGB 11.7* 11.4* 9.3* 8.8*  HCT 36.3 34.2* 27.7* 26.0*  MCV 98.4 95.5 93.0  --   PLT 76* 6* <5*  --    Basic Metabolic Panel: Recent Labs  Lab 06/24/19 1044 06/27/19 1109 06/30/19 2326 07/01/19 0040  NA 140 140 136 136  K 3.9 3.7 3.5 3.4*  CL 102 102 98 97*  CO2 28 25 27   --   GLUCOSE 107* 100* 122* 122*  BUN 13 12 7  4*  CREATININE 0.64 0.61 0.57 0.40*  CALCIUM 9.6 9.9 9.0  --    GFR: Estimated Creatinine Clearance: 87.2 mL/min (A) (by C-G formula based on SCr of 0.4 mg/dL (L)). Liver Function Tests: Recent Labs  Lab 06/24/19 1044 06/27/19 1109 06/30/19 2326  AST 61* 36 23  ALT 50* 37  24  ALKPHOS 85 108 102  BILITOT 1.0 1.4* 1.4*  PROT 6.3* 6.7 6.2*  ALBUMIN 3.3* 3.5 3.4*   Recent Labs  Lab 06/30/19 2326  LIPASE 18   No results for input(s): AMMONIA in the last 168 hours. Coagulation Profile: Recent Labs  Lab 06/30/19 2326  INR 1.1   Cardiac Enzymes: No results for input(s): CKTOTAL, CKMB, CKMBINDEX, TROPONINI in the last 168 hours. BNP (last 3 results) No results for input(s): PROBNP in the last 8760 hours. HbA1C: No results for input(s): HGBA1C in the last 72 hours. CBG: No results for input(s): GLUCAP in the last 168 hours. Lipid Profile: No results for input(s): CHOL, HDL, LDLCALC, TRIG, CHOLHDL, LDLDIRECT in the last 72 hours. Thyroid Function Tests: No results for input(s): TSH, T4TOTAL, FREET4, T3FREE, THYROIDAB in the last 72 hours. Anemia Panel: No results for input(s): VITAMINB12, FOLATE, FERRITIN, TIBC, IRON, RETICCTPCT in the last 72 hours. Urine analysis:    Component Value Date/Time   COLORURINE YELLOW 07/01/2019 0130   APPEARANCEUR CLEAR 07/01/2019 0130   LABSPEC 1.006 07/01/2019 0130   PHURINE 7.0 07/01/2019 0130   GLUCOSEU NEGATIVE 07/01/2019 0130   HGBUR NEGATIVE 07/01/2019 0130   BILIRUBINUR NEGATIVE 07/01/2019 0130   KETONESUR NEGATIVE 07/01/2019 0130   PROTEINUR  NEGATIVE 07/01/2019 0130   NITRITE NEGATIVE 07/01/2019 0130   LEUKOCYTESUR NEGATIVE 07/01/2019 0130    Radiological Exams on Admission: DG Chest Port 1 View  Result Date: 06/30/2019 CLINICAL DATA:  Fever. EXAM: PORTABLE CHEST 1 VIEW COMPARISON:  None. FINDINGS: The heart size and mediastinal contours are within normal limits. Both lungs are clear. The visualized skeletal structures are unremarkable. IMPRESSION: No active disease. Electronically Signed   By: Constance Holster M.D.   On: 06/30/2019 23:49    EKG: Independently reviewed. NSR, no acute changes  Assessment/Plan Active Problems:   Antineoplastic chemotherapy induced pancytopenia (HCC)   Headache   Essential hypertension   Mixed hyperlipidemia   AML (acute myeloblastic leukemia) (HCC)   Pancytopenia due to antineoplastic chemotherapy ([place])  (please populate well all problems here in Problem List. (For example, if patient is on BP meds at home and you resume or decide to hold them, it is a problem that needs to be her. Same for CAD, COPD, HLD and so on)   1. Pancytopenia due to antineoplastic chemo - patient with persistently low platlets now <5, WBC 0.6 Hgb dropping from 11.4 6/24  to 9.3 6/27 to 8.8 today. NO visible bleeding. Plan  Per recommendation of oncology will bring in for RBC transfusion -   1 u PRBCs irradiated to start. Patient consented.   Platlet transfusion to be determined by oncology  Granulocyte colony stimulating factor to be determined by oncology  2. Headache - patient with thrombocytopenia and new HA raises concern for ICH Plan CT head w/o contrast  3.HTN - continue home meds  4. HLD - continue home meds  5. Psych - patient on anti-depressants/antianxiety medications - will continue home regimen  6. Peripheral polyneuropathy - predated AML. Plan Continue gabapentin  DVT prophylaxis: heparin  Code Status: full code ( Family Communication: deferred to early hour at patient request    Disposition Plan: home when medically stable  Consults called: Oncology on call spoke with EDP. [name] will follow  Admission status: observation   Kelly Hare MD [facility] Pager [phone]  If 7PM-7AM, please contact night-coverage [email or web address] Password [facility]  07/01/2019, 4:48 AM

## 2019-07-01 NOTE — ED Notes (Signed)
Patient transported to CT 

## 2019-07-01 NOTE — Progress Notes (Signed)
Report received from ED 

## 2019-07-01 NOTE — ED Notes (Signed)
Date and time results received: 07/01/19 1:36 AM  Test: WBC Critical Value: 0.6  Test: Platelets  Critical Value: < 5  Name of Provider Notified: [place], EDP

## 2019-07-01 NOTE — ED Notes (Signed)
Patient back from CT.

## 2019-07-02 ENCOUNTER — Inpatient Hospital Stay (HOSPITAL_COMMUNITY): Payer: Medicaid Other

## 2019-07-02 ENCOUNTER — Encounter (HOSPITAL_COMMUNITY): Payer: Self-pay | Admitting: Internal Medicine

## 2019-07-02 DIAGNOSIS — Z79899 Other long term (current) drug therapy: Secondary | ICD-10-CM

## 2019-07-02 DIAGNOSIS — R5081 Fever presenting with conditions classified elsewhere: Secondary | ICD-10-CM

## 2019-07-02 DIAGNOSIS — C9200 Acute myeloblastic leukemia, not having achieved remission: Secondary | ICD-10-CM

## 2019-07-02 DIAGNOSIS — F419 Anxiety disorder, unspecified: Secondary | ICD-10-CM

## 2019-07-02 DIAGNOSIS — D6959 Other secondary thrombocytopenia: Secondary | ICD-10-CM

## 2019-07-02 DIAGNOSIS — I10 Essential (primary) hypertension: Secondary | ICD-10-CM

## 2019-07-02 DIAGNOSIS — D701 Agranulocytosis secondary to cancer chemotherapy: Secondary | ICD-10-CM

## 2019-07-02 LAB — PREPARE PLATELET PHERESIS: Unit division: 0

## 2019-07-02 LAB — CBC WITH DIFFERENTIAL/PLATELET
Abs Immature Granulocytes: 0.2 10*3/uL — ABNORMAL HIGH (ref 0.00–0.07)
Band Neutrophils: 1 %
Basophils Absolute: 0 10*3/uL (ref 0.0–0.1)
Basophils Relative: 0 %
Blasts: 4 %
Eosinophils Absolute: 0 10*3/uL (ref 0.0–0.5)
Eosinophils Relative: 0 %
HCT: 28.1 % — ABNORMAL LOW (ref 36.0–46.0)
Hemoglobin: 9.6 g/dL — ABNORMAL LOW (ref 12.0–15.0)
Lymphocytes Relative: 41 %
Lymphs Abs: 0.9 10*3/uL (ref 0.7–4.0)
MCH: 31.4 pg (ref 26.0–34.0)
MCHC: 34.2 g/dL (ref 30.0–36.0)
MCV: 91.8 fL (ref 80.0–100.0)
Monocytes Absolute: 0.3 10*3/uL (ref 0.1–1.0)
Monocytes Relative: 12 %
Myelocytes: 8 %
Neutro Abs: 0.7 10*3/uL — ABNORMAL LOW (ref 1.7–7.7)
Neutrophils Relative %: 34 %
Platelets: 27 10*3/uL — CL (ref 150–400)
RBC: 3.06 MIL/uL — ABNORMAL LOW (ref 3.87–5.11)
RDW: 17.2 % — ABNORMAL HIGH (ref 11.5–15.5)
WBC: 2.1 10*3/uL — ABNORMAL LOW (ref 4.0–10.5)
nRBC: 0 % (ref 0.0–0.2)

## 2019-07-02 LAB — BPAM RBC
Blood Product Expiration Date: 202107162359
ISSUE DATE / TIME: 202106280747
Unit Type and Rh: 6200

## 2019-07-02 LAB — BPAM PLATELET PHERESIS
Blood Product Expiration Date: 202106302359
ISSUE DATE / TIME: 202106281840
Unit Type and Rh: 5100

## 2019-07-02 LAB — BASIC METABOLIC PANEL
Anion gap: 9 (ref 5–15)
BUN: 6 mg/dL (ref 6–20)
CO2: 24 mmol/L (ref 22–32)
Calcium: 8.9 mg/dL (ref 8.9–10.3)
Chloride: 105 mmol/L (ref 98–111)
Creatinine, Ser: 0.44 mg/dL (ref 0.44–1.00)
GFR calc Af Amer: >60 mL/min (ref >60)
GFR calc non Af Amer: >60 mL/min (ref >60)
Glucose, Bld: 92 mg/dL (ref 70–99)
Potassium: 3.7 mmol/L (ref 3.5–5.1)
Sodium: 138 mmol/L (ref 135–145)

## 2019-07-02 LAB — TYPE AND SCREEN
ABO/RH(D): A POS
Antibody Screen: NEGATIVE
Unit division: 0

## 2019-07-02 LAB — URINE CULTURE: Culture: <10000 — AB

## 2019-07-02 LAB — MAGNESIUM: Magnesium: 1.5 mg/dL — ABNORMAL LOW (ref 1.7–2.4)

## 2019-07-02 MED ORDER — CHLORHEXIDINE GLUCONATE 0.12 % MT SOLN
15.0000 mL | Freq: Two times a day (BID) | OROMUCOSAL | Status: DC
  Administered 2019-07-02 – 2019-07-03 (×3): 15 mL via OROMUCOSAL
  Filled 2019-07-02 (×3): qty 15

## 2019-07-02 MED ORDER — MAGNESIUM SULFATE 4 GM/100ML IV SOLN
4.0000 g | Freq: Once | INTRAVENOUS | Status: AC
  Administered 2019-07-02: 4 g via INTRAVENOUS
  Filled 2019-07-02: qty 100

## 2019-07-02 MED ORDER — SODIUM CHLORIDE (PF) 0.9 % IJ SOLN
INTRAMUSCULAR | Status: AC
  Filled 2019-07-02: qty 50

## 2019-07-02 MED ORDER — ORAL CARE MOUTH RINSE
15.0000 mL | Freq: Two times a day (BID) | OROMUCOSAL | Status: DC
  Administered 2019-07-02 (×2): 15 mL via OROMUCOSAL

## 2019-07-02 MED ORDER — ADULT MULTIVITAMIN W/MINERALS CH
1.0000 | ORAL_TABLET | Freq: Every day | ORAL | Status: DC
  Administered 2019-07-02 – 2019-07-03 (×2): 1 via ORAL
  Filled 2019-07-02 (×2): qty 1

## 2019-07-02 MED ORDER — IOHEXOL 350 MG/ML SOLN
100.0000 mL | Freq: Once | INTRAVENOUS | Status: AC | PRN
  Administered 2019-07-02: 100 mL via INTRAVENOUS

## 2019-07-02 NOTE — Progress Notes (Signed)
Initial Nutrition Assessment  INTERVENTION:   -Daily snacks -Multivitamin with minerals daily  NUTRITION DIAGNOSIS:   Increased nutrient needs related to cancer and cancer related treatments as evidenced by estimated needs.  GOAL:   Patient will meet greater than or equal to 90% of their needs  MONITOR:   PO intake, Supplement acceptance, Weight trends, I & O's, Labs  REASON FOR ASSESSMENT:   Malnutrition Screening Tool    ASSESSMENT:   59 year old with history of HTN, IBS, AML diagnosed 04/30/2019 treated at [facility] admitted to the hospital for fevers of 100.5.  Lab work showed severe pancytopenia/neutropenia.  Patient reports having a fluctuating appetite. Pt has been receiving chemotherapy treatment at [facility]. Pt with history of gastric bypass surgery as well in 2013.  Pt reports not eating well yesterday d/t high pain levels. States she feels really hungry today and was eager to have CT done so she could eat again. Pt ate about 50% of her breakfast this morning. Pt not interested in protein supplements but knew to prioritize her protein foods with her meals. RD to order daily MVI and snacks.   Pt reports UBW of 225 lbs. Per weight records, pt has lost 18 lbs since 4/27 (8% wt loss x 2 months, significant for time frame).   Medications: KLOR-CON, Senokot, IV Mg sulfate Labs reviewed: Low Mg  NUTRITION - FOCUSED PHYSICAL EXAM:  No depletions  Diet Order:   Diet Order            Diet regular Room service appropriate? Yes; Fluid consistency: Thin  Diet effective now                 EDUCATION NEEDS:   No education needs have been identified at this time  Skin:  Skin Assessment: Reviewed RN Assessment  Last BM:  6/27  Height:   Ht Readings from Last 1 Encounters:  06/30/19 5\' 8"  (1.727 m)    Weight:   Wt Readings from Last 1 Encounters:  06/30/19 88.8 kg   BMI:  Body mass index is 29.76 kg/m.  Estimated Nutritional Needs:   Kcal:   2050-2250  Protein:  75-90g  Fluid:  2L/day  Clayton Bibles, MS, RD, LDN Inpatient Clinical Dietitian Contact information available via Amion

## 2019-07-02 NOTE — Progress Notes (Signed)
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 Found to have pancytopenia and peripheral blasts with intracellular inclusions.  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 4) 05/16/2019: repeat Bmbx shows no residual leukemia 5) 06/17/2019: patient returned for Cycle 1 Consolidation with cytarabine and gemtuzumab 6) 06/24/2019: presents to reconnect with [facility] for co-managed care. 7) 07/15/2019: anticipated Cycle 2 of consolidation therapy   CHIEF COMPLAINTS/PURPOSE OF CONSULTATION:  "Neutropenic fever "  HISTORY OF PRESENTING ILLNESS:  Kelly Roberson 59 y.o. female with medical history significant for AML s/p induction therapy and 1 cycle of consolidation who presents with concern for neutropenic fever.   [name] was previously seen on clinic on 06/24/2019 at which time she received a PEG filgrastim shot in order to help stimulate her bone marrow to produce any white blood cells.  She is consistently had an ANC of 0.0 with a platelet count initially of 76, however trended downward to less than 5 on last check 06/27/2019.  On 07/01/2019 the patient presented the emergency department with a temperature of 100.4 F and no focal infectious symptoms.  Due to concern for neutropenic fever the patient was admitted.  On exam today Ms. Mcinerny notes that she is having body aches and some issues with headache.  She reports that she began to feel these yesterday's and thought it might be the bone pain associated with filgrastim.  She notes that she is not having any focal symptoms at this time with no fevers, chills,  sweats, nausea, vomiting or diarrhea.  She notes that her bowels moved early Monday morning, but have not yet moved today.  She endorses having good appetite with no abdominal pain or discomfort.  A full 10 point ROS is listed below.  MEDICAL HISTORY:  Past Medical History:  Diagnosis Date  . Anxiety   . Hypertension   . IBS (irritable bowel syndrome)     SURGICAL HISTORY: Past Surgical History:  Procedure Laterality Date  . GASTRIC BYPASS  2013  . NISSEN FUNDOPLICATION  [id number]   Reversed in 2013  . ORIF ANKLE FRACTURE Left 06/2017  . PLACEMENT OF BREAST IMPLANTS      SOCIAL HISTORY: Social History   Socioeconomic History  . Marital status: Divorced    Spouse name: Not on file  . Number of children: Not on file  . Years of education: Not on file  . Highest education level: Not on file  Occupational History  . Not on file  Tobacco Use  . Smoking status: Former Research scientist (life sciences)  . Smokeless tobacco: Never Used  Substance and Sexual Activity  . Alcohol use: Not Currently    Comment: occasionally   . Drug use: Not Currently  . Sexual activity: Not Currently  Other Topics Concern  . Not on file  Social History Narrative  . Not on file   Social Determinants of Health   Financial Resource Strain:   . Difficulty of Paying Living Expenses:   Food Insecurity:   . Worried About Charity fundraiser in the Last Year:   . Arboriculturist in the Last Year:   Transportation Needs:   . Film/video editor (Medical):   Marland Kitchen  Lack of Transportation (Non-Medical):   Physical Activity:   . Days of Exercise per Week:   . Minutes of Exercise per Session:   Stress:   . Feeling of Stress :   Social Connections:   . Frequency of Communication with Friends and Family:   . Frequency of Social Gatherings with Friends and Family:   . Attends Religious Services:   . Active Member of Clubs or Organizations:   . Attends Archivist Meetings:   Marland Kitchen Marital Status:   Intimate Partner  Violence:   . Fear of Current or Ex-Partner:   . Emotionally Abused:   Marland Kitchen Physically Abused:   . Sexually Abused:     FAMILY HISTORY: Family History  Problem Relation Age of Onset  . Pancreatic cancer Father   . Colon cancer Maternal Aunt   . Diabetes Maternal Uncle   . Pancreatic cancer Maternal Grandfather     ALLERGIES:  is allergic to lisinopril.  MEDICATIONS:  Current Facility-Administered Medications  Medication Dose Route Frequency Provider Last Rate Last Admin  . 0.9 %  sodium chloride infusion  1,000 mL Intravenous Continuous Cardama, Grayce Sessions, MD 125 mL/hr at 07/01/19 0156 1,000 mL at 07/01/19 0156  . acyclovir (ZOVIRAX) 200 MG capsule 400 mg  400 mg Oral BID Amin, Ankit Chirag, MD   400 mg at 07/02/19 1006  . ceFEPIme (MAXIPIME) 2 g in sodium chloride 0.9 % 100 mL IVPB  2 g Intravenous Q8H Ledell Peoples IV, MD 200 mL/hr at 07/02/19 1350 2 g at 07/02/19 1350  . chlorhexidine (PERIDEX) 0.12 % solution 15 mL  15 mL Mouth Rinse BID Amin, Ankit Chirag, MD   15 mL at 07/02/19 1008  . entecavir (BARACLUDE) tablet 0.5 mg  0.5 mg Oral Daily Norins, Heinz Knuckles, MD      . escitalopram (LEXAPRO) tablet 10 mg  10 mg Oral Daily Amin, Ankit Chirag, MD   10 mg at 07/02/19 1009  . gabapentin (NEURONTIN) capsule 1,200 mg  1,200 mg Oral QHS Amin, Ankit Chirag, MD   1,200 mg at 07/01/19 2220  . gabapentin (NEURONTIN) capsule 900 mg  900 mg Oral BID Amin, Ankit Chirag, MD   900 mg at 07/02/19 1742  . hydrochlorothiazide (HYDRODIURIL) tablet 25 mg  25 mg Oral Daily Amin, Ankit Chirag, MD   25 mg at 07/02/19 1007  . LORazepam (ATIVAN) tablet 1 mg  1 mg Oral Q6H PRN Norins, Heinz Knuckles, MD      . losartan (COZAAR) tablet 25 mg  25 mg Oral Daily Amin, Ankit Chirag, MD   25 mg at 07/02/19 1006  . MEDLINE mouth rinse  15 mL Mouth Rinse q12n4p Amin, Ankit Chirag, MD   15 mL at 07/02/19 1600  . multivitamin with minerals tablet 1 tablet  1 tablet Oral Daily Amin, Jeanella Flattery, MD   1 tablet at  07/02/19 1348  . ondansetron (ZOFRAN) tablet 4 mg  4 mg Oral Q6H PRN Norins, Heinz Knuckles, MD   4 mg at 07/01/19 1701  . oxyCODONE (Oxy IR/ROXICODONE) immediate release tablet 5 mg  5 mg Oral Q4H PRN Amin, Ankit Chirag, MD   5 mg at 07/01/19 1658  . polyethylene glycol (MIRALAX / GLYCOLAX) packet 17 g  17 g Oral Daily PRN Amin, Ankit Chirag, MD      . potassium chloride SA (KLOR-CON) CR tablet 20 mEq  20 mEq Oral Daily Norins, Heinz Knuckles, MD      . senna-docusate (Senokot-S) tablet 2  tablet  2 tablet Oral QHS Damita Lack, MD   2 tablet at 07/01/19 2220  . sodium chloride (PF) 0.9 % injection           . traMADol (ULTRAM) tablet 50 mg  50 mg Oral Q8H PRN Neena Rhymes, MD   50 mg at 07/02/19 0601  . traZODone (DESYREL) tablet 50 mg  50 mg Oral QHS PRN Amin, Ankit Chirag, MD        REVIEW OF SYSTEMS:   Constitutional: ( - ) fevers, ( - )  chills , ( - ) night sweats Eyes: ( - ) blurriness of vision, ( - ) double vision, ( - ) watery eyes Ears, nose, mouth, throat, and face: ( - ) mucositis, ( - ) sore throat Respiratory: ( - ) cough, ( - ) dyspnea, ( - ) wheezes Cardiovascular: ( - ) palpitation, ( - ) chest discomfort, ( - ) lower extremity swelling Gastrointestinal:  ( - ) nausea, ( - ) heartburn, ( - ) change in bowel habits Skin: ( - ) abnormal skin rashes Lymphatics: ( - ) new lymphadenopathy, ( - ) easy bruising Neurological: ( - ) numbness, ( - ) tingling, ( - ) new weaknesses Behavioral/Psych: ( - ) mood change, ( - ) new changes  All other systems were reviewed with the patient and are negative.  PHYSICAL EXAMINATION:  Vitals:   07/02/19 0601 07/02/19 1440  BP: 138/70 120/72  Pulse:  86  Resp:  16  Temp:  98.5 F (36.9 C)  SpO2:     Filed Weights   06/30/19 2308  Weight: 195 lb 11.2 oz (88.8 kg)    GENERAL: well appearing middle aged Caucasian female, alert, no distress and comfortable SKIN: skin color, texture, turgor are normal, no rashes or significant  lesions. Patient is bald.  EYES: conjunctiva are pink and non-injected, sclera clear LUNGS: clear to auscultation and percussion with normal breathing effort HEART: regular rate & rhythm and no murmurs and no lower extremity edema Musculoskeletal: no cyanosis of digits and no clubbing  PSYCH: alert & oriented x 3, fluent speech NEURO: no focal motor/sensory deficits  LABORATORY DATA:  I have reviewed the data as listed CBC Latest Ref Rng & Units 07/02/2019 07/01/2019 06/30/2019  WBC 4.0 - 10.5 K/uL 2.1(L) - 0.6(LL)  Hemoglobin 12.0 - 15.0 g/dL 9.6(L) 8.8(L) 9.3(L)  Hematocrit 36 - 46 % 28.1(L) 26.0(L) 27.7(L)  Platelets 150 - 400 K/uL 27(LL) - <5(LL)    CMP Latest Ref Rng & Units 07/02/2019 07/01/2019 06/30/2019  Glucose 70 - 99 mg/dL 92 122(H) 122(H)  BUN 6 - 20 mg/dL 6 4(L) 7  Creatinine 0.44 - 1.00 mg/dL 0.44 0.40(L) 0.57  Sodium 135 - 145 mmol/L 138 136 136  Potassium 3.5 - 5.1 mmol/L 3.7 3.4(L) 3.5  Chloride 98 - 111 mmol/L 105 97(L) 98  CO2 22 - 32 mmol/L 24 - 27  Calcium 8.9 - 10.3 mg/dL 8.9 - 9.0  Total Protein 6.5 - 8.1 g/dL - - 6.2(L)  Total Bilirubin 0.3 - 1.2 mg/dL - - 1.4(H)  Alkaline Phos 38 - 126 U/L - - 102  AST 15 - 41 U/L - - 23  ALT 0 - 44 U/L - - 24    RADIOGRAPHIC STUDIES: CT ANGIO HEAD W OR WO CONTRAST  Result Date: 07/02/2019 CLINICAL DATA:  Headache, acute, normal neuro exam. Additional provided: Headache radiates down both arms and into neck with numbness/weakness in both legs. EXAM: CT  ANGIOGRAPHY HEAD AND NECK TECHNIQUE: Multidetector CT imaging of the head and neck was performed using the standard protocol during bolus administration of intravenous contrast. Multiplanar CT image reconstructions and MIPs were obtained to evaluate the vascular anatomy. Carotid stenosis measurements (when applicable) are obtained utilizing NASCET criteria, using the distal internal carotid diameter as the denominator. CONTRAST:  149mL OMNIPAQUE IOHEXOL 350 MG/ML SOLN COMPARISON:   Head CT performed 1 day prior 07/01/2019 FINDINGS: CT HEAD FINDINGS Brain: Stable, mild generalized parenchymal atrophy. 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 There is no acute intracranial hemorrhage. No demarcated cortical infarct. No extra-axial fluid collection. No evidence of intracranial mass. No midline shift. Vascular: Reported below. Skull: Normal. Negative for fracture or focal lesion. Sinuses: Tiny left maxillary sinus mucous retention cyst. No significant mastoid effusion Orbits: No acute abnormality. Review of the MIP images confirms the above findings CTA NECK FINDINGS Aortic arch: Standard aortic branching. Mild calcified plaque within the visualized aortic arch. Streak artifact from a dense right-sided contrast bolus limits evaluation of the mid to distal right subclavian artery. No hemodynamically significant innominate or proximal subclavian artery stenosis. Right carotid system: CCA and ICA patent within the neck without significant stenosis (50% or greater). Mild mixed plaque within the carotid bifurcation and proximal ICA. Left carotid system: CCA and ICA patent within the neck without significant stenosis (50% or greater). Minimal mixed plaque within the proximal ICA. Vertebral arteries: The vertebral arteries are patent within the neck bilaterally without significant stenosis. The left vertebral artery is slightly dominant. Skeleton: No acute bony abnormality or aggressive osseous lesion. Other neck: No neck mass or cervical lymphadenopathy. Thyroid unremarkable. Upper chest: 8 mm ground-glass nodule within the left upper lobe (series 13, image 331). Review of the MIP images confirms the above findings CTA HEAD FINDINGS Anterior circulation: The intracranial internal carotid arteries are patent without significant stenosis. The M1 middle cerebral arteries are patent without significant stenosis. No M2 proximal  branch occlusion or high-grade proximal stenosis is identified. The anterior cerebral arteries are patent without significant proximal stenosis. There is a 1-2 mm vascular protrusion arising from the supraclinoid right ICA which may reflect a tiny aneurysm or infundibulum (series 15, image 92). A 1-2 mm vascular protrusion arising from the supraclinoid left ICA appears to reflect a small infundibulum (series 15, image 113). Posterior circulation: The intracranial vertebral arteries are patent without significant stenosis, as is the basilar artery. The posterior cerebral arteries are patent bilaterally without significant proximal stenosis posterior communicating arteries are hypoplastic or absent bilaterally. Venous sinuses: Within limitations of contrast timing, no convincing thrombus. Anatomic variants: As described Review of the MIP images confirms the above findings IMPRESSION: CT head: 1. No CT evidence of acute intracranial abnormality. 2. Stable mild generalized parenchymal atrophy. 3. 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 4. Tiny left maxillary sinus mucous retention cyst. CTA neck: 1. The common carotid, internal carotid and vertebral arteries are patent within the neck without hemodynamically significant stenosis. Mild mixed plaque within the right carotid bifurcation, proximal right ICA and proximal left ICA. There is also minimal calcified plaque within the visualized aortic arch. 2. 8 mm ground-glass nodule within the imaged left lung apex. Initial follow-up with CT at 6-12 months is recommended to confirm persistence. If persistent, repeat CT is recommended every 2 years until 5 years of stability has been established. This recommendation follows the consensus statement: Guidelines for  Management of Incidental Pulmonary Nodules Detected on CT Images: From the Fleischner Society 2017; Radiology 2017; 284:228-243. CTA head: 1.  No intracranial large vessel occlusion or proximal high-grade arterial stenosis. 2. 1-2 mm infundibulum versus tiny aneurysm arising from the supraclinoid right ICA. Electronically Signed   By: Kellie Simmering DO   On: 07/02/2019 12:47   CT HEAD WO CONTRAST  Result Date: 07/01/2019 CLINICAL DATA:  Headache with intracranial hemorrhage suspected. Pancytopenia. EXAM: CT HEAD WITHOUT CONTRAST TECHNIQUE: Contiguous axial images were obtained from the base of the skull through the vertex without intravenous contrast. COMPARISON:  None. FINDINGS: Brain: No evidence of acute cortical infarction, hemorrhage, hydrocephalus, extra-axial collection or mass lesion/mass effect. 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 Vascular: No hyperdense vessel or unexpected calcification. Skull: Normal. Negative for fracture or focal lesion. Sinuses/Orbits: Negative.  No visualized sinusitis. IMPRESSION: No acute finding.  Negative for intracranial hemorrhage. Electronically Signed   By: Monte Fantasia M.D.   On: 07/01/2019 05:23   CT ANGIO NECK W OR WO CONTRAST  Result Date: 07/02/2019 CLINICAL DATA:  Headache, acute, normal neuro exam. Additional provided: Headache radiates down both arms and into neck with numbness/weakness in both legs. EXAM: CT ANGIOGRAPHY HEAD AND NECK TECHNIQUE: Multidetector CT imaging of the head and neck was performed using the standard protocol during bolus administration of intravenous contrast. Multiplanar CT image reconstructions and MIPs were obtained to evaluate the vascular anatomy. Carotid stenosis measurements (when applicable) are obtained utilizing NASCET criteria, using the distal internal carotid diameter as the denominator. CONTRAST:  148mL OMNIPAQUE IOHEXOL 350 MG/ML SOLN COMPARISON:  Head CT performed 1 day prior 07/01/2019 FINDINGS: CT HEAD FINDINGS Brain: Stable, mild generalized parenchymal atrophy. Redemonstrated small chronic lacunar  infarct within the anterior limb of right internal capsule/right caudate head. There is no acute intracranial hemorrhage. No demarcated cortical infarct. No extra-axial fluid collection. No evidence of intracranial mass. No midline shift. Vascular: Reported below. Skull: Normal. Negative for fracture or focal lesion. Sinuses: Tiny left maxillary sinus mucous retention cyst. No significant mastoid effusion Orbits: No acute abnormality. Review of the MIP images confirms the above findings CTA NECK FINDINGS Aortic arch: Standard aortic branching. Mild calcified plaque within the visualized aortic arch. Streak artifact from a dense right-sided contrast bolus limits evaluation of the mid to distal right subclavian artery. No hemodynamically significant innominate or proximal subclavian artery stenosis. Right carotid system: CCA and ICA patent within the neck without significant stenosis (50% or greater). Mild mixed plaque within the carotid bifurcation and proximal ICA. Left carotid system: CCA and ICA patent within the neck without significant stenosis (50% or greater). Minimal mixed plaque within the proximal ICA. Vertebral arteries: The vertebral arteries are patent within the neck bilaterally without significant stenosis. The left vertebral artery is slightly dominant. Skeleton: No acute bony abnormality or aggressive osseous lesion. Other neck: No neck mass or cervical lymphadenopathy. Thyroid unremarkable. Upper chest: 8 mm ground-glass nodule within the left upper lobe (series 13, image 331). Review of the MIP images confirms the above findings CTA HEAD FINDINGS Anterior circulation: The intracranial internal carotid arteries are patent without significant stenosis. The M1 middle cerebral arteries are patent without significant stenosis. No M2 proximal branch occlusion or high-grade proximal stenosis is identified. The anterior cerebral arteries are patent without significant proximal stenosis. There is a 1-2 mm  vascular protrusion arising from the supraclinoid right ICA which may reflect a tiny aneurysm or infundibulum (series 15, image  92). A 1-2 mm vascular protrusion arising from the supraclinoid left ICA appears to reflect a small infundibulum (series 15, image 113). Posterior circulation: The intracranial vertebral arteries are patent without significant stenosis, as is the basilar artery. The posterior cerebral arteries are patent bilaterally without significant proximal stenosis posterior communicating arteries are hypoplastic or absent bilaterally. Venous sinuses: Within limitations of contrast timing, no convincing thrombus. Anatomic variants: As described Review of the MIP images confirms the above findings IMPRESSION: CT head: 1. No CT evidence of acute intracranial abnormality. 2. Stable mild generalized parenchymal atrophy. 3. 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 4. Tiny left maxillary sinus mucous retention cyst. CTA neck: 1. The common carotid, internal carotid and vertebral arteries are patent within the neck without hemodynamically significant stenosis. Mild mixed plaque within the right carotid bifurcation, proximal right ICA and proximal left ICA. There is also minimal calcified plaque within the visualized aortic arch. 2. 8 mm ground-glass nodule within the imaged left lung apex. Initial follow-up with CT at 6-12 months is recommended to confirm persistence. If persistent, repeat CT is recommended every 2 years until 5 years of stability has been established. This recommendation follows the consensus statement: Guidelines for Management of Incidental Pulmonary Nodules Detected on CT Images: From the Fleischner Society 2017; Radiology 2017; 284:228-243. CTA head: 1. No intracranial large vessel occlusion or proximal high-grade arterial stenosis. 2. 1-2 mm infundibulum versus tiny aneurysm arising from the supraclinoid right  ICA. Electronically Signed   By: Kellie Simmering DO   On: 07/02/2019 12:47   DG Chest Port 1 View  Result Date: 06/30/2019 CLINICAL DATA:  Fever. EXAM: PORTABLE CHEST 1 VIEW COMPARISON:  None. FINDINGS: The heart size and mediastinal contours are within normal limits. Both lungs are clear. The visualized skeletal structures are unremarkable. IMPRESSION: No active disease. Electronically Signed   By: Constance Holster M.D.   On: 06/30/2019 23:49    ASSESSMENT & PLAN Kelly Roberson 59 y.o. female with medical history significant for AML s/p induction therapy and 1 cycle of consolidation who presents with concern for neutropenic fever.  Patient presented to the emergency department with a temperature of 100.4 measured at home with no focal infectious symptoms.  She notes that she has not had any issues with subjective fevers, chills, sweats, nausea, vomiting or diarrhea.  Though her temperature was elevated at home on record since arrival here she has not been found to have an elevated temperature.  Per neutropenic fever protocols we will treat the patient with cefepime 2 g IV every 8 hours for management of this neutropenic fever while awaiting for her ANC to rise.  She received a PEG filgrastim shot on 06/24/2019 and only recently began having the bone pain from that medication.  Her ANC today is 0.7 rising from 0.0 yesterday.  Her goal for discharge would be a ANC of 1.5 and if this is reached we can continue to monitor in the outpatient setting.  #Neutropenic Fever --continue cefepime 2g IV q8H for management of neutropenic fever --f/u culture data to assure no focal infection to treat --ANC today 0.7, rising from 0.0 yesterday. Goal of 1.5 and trending upward for discharge --continue acyclovir 400mg  BID --continue to monitor  #Thrombocytopenia --s/p 1 unit of Plt in the ED yesterday  --transfusion goal of Plt >10 --will follow twice weekly in the clinic after discharge for outpatient  transfusion --continue to monitor  # Acute Myeloid Leukemia, Favorable Risk  (  RUNX1/RUNX1T1) --currently comanaged with Dr. Elmo Putt at [facility] --plan for next cycle to start 07/15/2019  All questions were answered. The patient knows to call the clinic with any problems, questions or concerns.  A total of more than 55 minutes were spent on this encounter and over half of that time was spent on counseling and coordination of care as outlined above.   Ledell Peoples, MD Department of Hematology/Oncology [place] at [facility] Phone: [phone] Pager: [phone] Email: [email or web address]  07/02/2019 5:46 PM

## 2019-07-02 NOTE — Progress Notes (Signed)
PROGRESS NOTE    Kelly Roberson  UTM:[id number] DOB: 11-07-1960 DOA: 06/30/2019 PCP: Ladell Pier, MD   Brief Narrative:  59 year old with history of HTN, IBS, AML diagnosed 04/30/2019 treated at [facility] admitted to the hospital for fevers of 100.5.  Lab work showed severe pancytopenia/neutropenia.  Oncology consulted.  Empirically started on cefepime, received 1 unit of PRBC and platelet.  Due to persistent head and neck pain, CTA ordered to rule out dissection   Assessment & Plan:   Active Problems:   Essential hypertension   Mixed hyperlipidemia   AML (acute myeloblastic leukemia) (HCC)   Antineoplastic chemotherapy induced pancytopenia (HCC)   Pancytopenia due to antineoplastic chemotherapy (HCC)   Headache   Neutropenia (HCC)  Severe pancytopenia due to bone marrow suppression from chemotherapy -Status post 1 unit PRBC irradiated blood, status post 1 unit platelet.  Her counts have improved today -Appreciate oncology input-on empiric cefepime -Acyclovir twice daily, entecavir daily -IV fluids  Fever, neutropenia -Panculture= no growth today -UA-negative -Chest x-ray-negative, procalcitonin-negative -Empiric cefepime  Persistent headache with bilateral neck discomfort -CT head-negative.  Pain control -CTA head and neck to rule out any dissection versus spontaneous bleeding due to severe thrombocytopenia  Essential hypertension -HCTZ 25 mg daily, losartan 25 mg daily  Depression/anxiety -Lexapro 10 mg daily, trazodone nightly  Peripheral neuropathy -Gabapentin 900 mg 3 times daily, daily folic acid  GERD -PPI daily   DVT prophylaxis: Place and maintain sequential compression device Start: 07/01/19 1000 Code Status: Full code Family Communication: None  Status is: Inpatient   Dispo: The patient is from: Home              Anticipated d/c is to: Home              Anticipated d/c date is: 2 days              Patient currently is not medically stable to  d/c.  Due to neutropenia she is on empiric antibiotics.  Maintain hospital stay on IV antibiotics, monitor cultures.  In the meantime undergoing evaluation for CT of the head and neck due to persistent pain Body mass index is 29.76 kg/m.  Consultants:   Oncology   Subjective: This morning still having bilateral headache along with neck pain.  Denies any nausea or vomiting.  Remains afebrile overnight.  Review of Systems Otherwise negative except as per HPI, including: General: Denies fever, chills, night sweats or unintended weight Roberson. Resp: Denies cough, wheezing, shortness of breath. Cardiac: Denies chest pain, palpitations, orthopnea, paroxysmal nocturnal dyspnea. GI: Denies abdominal pain, nausea, vomiting, diarrhea or constipation GU: Denies dysuria, frequency, hesitancy or incontinence MS: Denies muscle aches, joint pain or swelling Neuro: Denies headach, neurologic deficits (focal weakness, numbness, tingling), abnormal gait Psych: Denies anxiety, depression, SI/HI/AVH Skin: Denies new rashes or lesions ID: Denies sick contacts, exotic exposures, travel Examination:  Constitutional: Not in acute distress Respiratory: Clear to auscultation bilaterally Cardiovascular: Normal sinus rhythm, no rubs Abdomen: Nontender nondistended good bowel sounds Musculoskeletal: No edema noted Skin: No rashes seen Neurologic: CN 2-12 grossly intact.  And nonfocal Psychiatric: Normal judgment and insight. Alert and oriented x 3. Normal mood.   Objective: Vitals:   07/02/19 0021 07/02/19 0339 07/02/19 0558 07/02/19 0601  BP:  130/62  138/70  Pulse: 83 87 87   Resp:      Temp: 98.9 F (37.2 C) 99.4 F (37.4 C) 100 F (37.8 C)   TempSrc: Oral Oral Oral   SpO2: 91% 93% 94%  Weight:      Height:        Intake/Output Summary (Last 24 hours) at 07/02/2019 1053 Last data filed at 07/02/2019 0554 Gross per 24 hour  Intake 1328.45 ml  Output 250 ml  Net 1078.45 ml   Filed Weights    06/30/19 2308  Weight: 88.8 kg     Data Reviewed:   CBC: Recent Labs  Lab 06/27/19 1109 06/30/19 2326 07/01/19 0040 07/02/19 0542  WBC 0.4* 0.6*  --  2.1*  NEUTROABS 0.0* 0.0*  --  0.7*  HGB 11.4* 9.3* 8.8* 9.6*  HCT 34.2* 27.7* 26.0* 28.1*  MCV 95.5 93.0  --  91.8  PLT 6* <5*  --  27*   Basic Metabolic Panel: Recent Labs  Lab 06/27/19 1109 06/30/19 2326 07/01/19 0040 07/02/19 0542  NA 140 136 136 138  K 3.7 3.5 3.4* 3.7  CL 102 98 97* 105  CO2 25 27  --  24  GLUCOSE 100* 122* 122* 92  BUN 12 7 4* 6  CREATININE 0.61 0.57 0.40* 0.44  CALCIUM 9.9 9.0  --  8.9  MG  --   --   --  1.5*   GFR: Estimated Creatinine Clearance: 87.2 mL/min (by C-G formula based on SCr of 0.44 mg/dL). Liver Function Tests: Recent Labs  Lab 06/27/19 1109 06/30/19 2326  AST 36 23  ALT 37 24  ALKPHOS 108 102  BILITOT 1.4* 1.4*  PROT 6.7 6.2*  ALBUMIN 3.5 3.4*   Recent Labs  Lab 06/30/19 2326  LIPASE 18   No results for input(s): AMMONIA in the last 168 hours. Coagulation Profile: Recent Labs  Lab 06/30/19 2326  INR 1.1   Cardiac Enzymes: No results for input(s): CKTOTAL, CKMB, CKMBINDEX, TROPONINI in the last 168 hours. BNP (last 3 results) No results for input(s): PROBNP in the last 8760 hours. HbA1C: No results for input(s): HGBA1C in the last 72 hours. CBG: No results for input(s): GLUCAP in the last 168 hours. Lipid Profile: No results for input(s): CHOL, HDL, LDLCALC, TRIG, CHOLHDL, LDLDIRECT in the last 72 hours. Thyroid Function Tests: No results for input(s): TSH, T4TOTAL, FREET4, T3FREE, THYROIDAB in the last 72 hours. Anemia Panel: No results for input(s): VITAMINB12, FOLATE, FERRITIN, TIBC, IRON, RETICCTPCT in the last 72 hours. Sepsis Labs: Recent Labs  Lab 06/30/19 2326  PROCALCITON <0.10  LATICACIDVEN 1.7    Recent Results (from the past 240 hour(s))  Blood Culture (routine x 2)     Status: None (Preliminary result)   Collection Time:  06/30/19 11:26 PM   Specimen: BLOOD  Result Value Ref Range Status   Specimen Description   Final    BLOOD RIGHT ANTECUBITAL Performed at [facility], [place] [address] [place], [place] [address]    Special Requests   Final    BOTTLES DRAWN AEROBIC AND ANAEROBIC Blood Culture results may not be optimal due to an excessive volume of blood received in culture bottles Performed at [facility] [address], [place], [place] [address]    Culture   Final    NO GROWTH < 24 HOURS Performed at [facility] [address], [place], [place] [address]    Report Status PENDING  Incomplete  Urine culture     Status: Abnormal   Collection Time: 07/01/19  1:30 AM   Specimen: Urine, Random  Result Value Ref Range Status   Specimen Description   Final    URINE, RANDOM Performed at  [facility], [place] [address], [place], [place] [address]    Special Requests   Final    NONE Performed at [facility], [place] [address] [place], [place] [address]    Culture (A)  Final    <10,000 COLONIES/mL INSIGNIFICANT GROWTH Performed at [facility] [address], [place], [place] [address]    Report Status 07/02/2019 FINAL  Final  SARS Coronavirus 2 by RT PCR (hospital order, performed in [facility] hospital lab) Nasopharyngeal Nasopharyngeal Swab     Status: None   Collection Time: 07/01/19  3:16 AM   Specimen: Nasopharyngeal Swab  Result Value Ref Range Status   SARS Coronavirus 2 NEGATIVE NEGATIVE Final    Comment: (NOTE) SARS-CoV-2 target nucleic acids are NOT DETECTED.  The SARS-CoV-2 RNA is generally detectable in upper and lower respiratory specimens during the acute phase of infection. The lowest concentration of SARS-CoV-2 viral copies this assay can detect is 250 copies / mL. A negative result does not preclude SARS-CoV-2 infection and should not be used as the sole basis for treatment or other patient management  decisions.  A negative result may occur with improper specimen collection / handling, submission of specimen other than nasopharyngeal swab, presence of viral mutation(s) within the areas targeted by this assay, and inadequate number of viral copies (<250 copies / mL). A negative result must be combined with clinical observations, patient history, and epidemiological information.  Fact Sheet for Patients:   StrictlyIdeas.no  Fact Sheet for Healthcare Providers: BankingDealers.co.za  This test is not yet approved or  cleared by the [place] FDA and has been authorized for detection and/or diagnosis of SARS-CoV-2 by FDA under an Emergency Use Authorization (EUA).  This EUA will remain in effect (meaning this test can be used) for the duration of the COVID-19 declaration under Section 564(b)(1) of the Act, 21 U.S.C. section 360bbb-3(b)(1), unless the authorization is terminated or revoked sooner.  Performed at [facility], [place] [address], [place], [place] [address]          Radiology Studies: CT HEAD WO CONTRAST  Result Date: 07/01/2019 CLINICAL DATA:  Headache with intracranial hemorrhage suspected. Pancytopenia. EXAM: CT HEAD WITHOUT CONTRAST TECHNIQUE: Contiguous axial images were obtained from the base of the skull through the vertex without intravenous contrast. COMPARISON:  None. FINDINGS: Brain: No evidence of acute cortical infarction, hemorrhage, hydrocephalus, extra-axial collection or mass lesion/mass effect. 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 Vascular: No hyperdense vessel or unexpected calcification. Skull: Normal. Negative for fracture or focal lesion. Sinuses/Orbits: Negative.  No visualized sinusitis. IMPRESSION: No acute finding.  Negative for intracranial hemorrhage. Electronically Signed   By: Monte Fantasia M.D.   On: 07/01/2019 05:23    DG Chest Port 1 View  Result Date: 06/30/2019 CLINICAL DATA:  Fever. EXAM: PORTABLE CHEST 1 VIEW COMPARISON:  None. FINDINGS: The heart size and mediastinal contours are within normal limits. Both lungs are clear. The visualized skeletal structures are unremarkable. IMPRESSION: No active disease. Electronically Signed   By: Constance Holster M.D.   On: 06/30/2019 23:49        Scheduled Meds: . acyclovir  400 mg Oral BID  . chlorhexidine  15 mL Mouth Rinse BID  . entecavir  0.5 mg Oral Daily  . escitalopram  10 mg Oral Daily  . gabapentin  1,200 mg Oral QHS  . gabapentin  900 mg Oral BID  . hydrochlorothiazide  25 mg Oral Daily  . losartan  25 mg Oral Daily  . mouth rinse  15 mL Mouth Rinse q12n4p  . potassium chloride SA  20 mEq Oral Daily  . senna-docusate  2 tablet Oral QHS   Continuous Infusions: . sodium chloride 1,000 mL (07/01/19 0156)  . sodium chloride 75 mL/hr at 07/02/19 0237  . ceFEPime (MAXIPIME) IV 2 g (07/02/19 0557)     LOS: 1 day   Time spent= 35 mins    Johnthomas Lader Arsenio Loader, MD [facility]  If 7PM-7AM, please contact night-coverage  07/02/2019, 10:53 AM

## 2019-07-03 ENCOUNTER — Encounter: Payer: Self-pay | Admitting: General Practice

## 2019-07-03 ENCOUNTER — Other Ambulatory Visit: Payer: Self-pay | Admitting: *Deleted

## 2019-07-03 DIAGNOSIS — D61810 Antineoplastic chemotherapy induced pancytopenia: Principal | ICD-10-CM

## 2019-07-03 DIAGNOSIS — R519 Headache, unspecified: Secondary | ICD-10-CM

## 2019-07-03 DIAGNOSIS — D709 Neutropenia, unspecified: Secondary | ICD-10-CM

## 2019-07-03 DIAGNOSIS — E782 Mixed hyperlipidemia: Secondary | ICD-10-CM

## 2019-07-03 DIAGNOSIS — C9501 Acute leukemia of unspecified cell type, in remission: Secondary | ICD-10-CM

## 2019-07-03 DIAGNOSIS — T451X5A Adverse effect of antineoplastic and immunosuppressive drugs, initial encounter: Secondary | ICD-10-CM

## 2019-07-03 DIAGNOSIS — D701 Agranulocytosis secondary to cancer chemotherapy: Secondary | ICD-10-CM

## 2019-07-03 DIAGNOSIS — D696 Thrombocytopenia, unspecified: Secondary | ICD-10-CM

## 2019-07-03 DIAGNOSIS — C9201 Acute myeloblastic leukemia, in remission: Secondary | ICD-10-CM

## 2019-07-03 LAB — MAGNESIUM: Magnesium: 2.1 mg/dL (ref 1.7–2.4)

## 2019-07-03 LAB — BASIC METABOLIC PANEL
Anion gap: 8 (ref 5–15)
BUN: 6 mg/dL (ref 6–20)
CO2: 26 mmol/L (ref 22–32)
Calcium: 9 mg/dL (ref 8.9–10.3)
Chloride: 103 mmol/L (ref 98–111)
Creatinine, Ser: 0.52 mg/dL (ref 0.44–1.00)
GFR calc Af Amer: >60 mL/min (ref >60)
GFR calc non Af Amer: >60 mL/min (ref >60)
Glucose, Bld: 93 mg/dL (ref 70–99)
Potassium: 3.3 mmol/L — ABNORMAL LOW (ref 3.5–5.1)
Sodium: 137 mmol/L (ref 135–145)

## 2019-07-03 LAB — CBC WITH DIFFERENTIAL/PLATELET
Abs Immature Granulocytes: 0.3 10*3/uL — ABNORMAL HIGH (ref 0.00–0.07)
Band Neutrophils: 2 %
Basophils Absolute: 0 10*3/uL (ref 0.0–0.1)
Basophils Relative: 0 %
Blasts: 2 %
Eosinophils Absolute: 0 10*3/uL (ref 0.0–0.5)
Eosinophils Relative: 0 %
HCT: 29.1 % — ABNORMAL LOW (ref 36.0–46.0)
Hemoglobin: 9.8 g/dL — ABNORMAL LOW (ref 12.0–15.0)
Lymphocytes Relative: 42 %
Lymphs Abs: 2.6 10*3/uL (ref 0.7–4.0)
MCH: 31.2 pg (ref 26.0–34.0)
MCHC: 33.7 g/dL (ref 30.0–36.0)
MCV: 92.7 fL (ref 80.0–100.0)
Monocytes Absolute: 0.6 10*3/uL (ref 0.1–1.0)
Monocytes Relative: 9 %
Myelocytes: 5 %
Neutro Abs: 2.6 10*3/uL (ref 1.7–7.7)
Neutrophils Relative %: 40 %
Platelets: 46 10*3/uL — ABNORMAL LOW (ref 150–400)
RBC: 3.14 MIL/uL — ABNORMAL LOW (ref 3.87–5.11)
RDW: 17.2 % — ABNORMAL HIGH (ref 11.5–15.5)
WBC: 6.2 10*3/uL (ref 4.0–10.5)
nRBC: 0 % (ref 0.0–0.2)

## 2019-07-03 LAB — PHOSPHORUS: Phosphorus: 3.9 mg/dL (ref 2.5–4.6)

## 2019-07-03 MED ORDER — SENNOSIDES-DOCUSATE SODIUM 8.6-50 MG PO TABS: 1.0000 | ORAL_TABLET | Freq: Every day | ORAL | 0 refills | Status: DC

## 2019-07-03 MED ORDER — ADULT MULTIVITAMIN W/MINERALS CH: 1.0000 | ORAL_TABLET | Freq: Every day | ORAL | 0 refills | Status: DC

## 2019-07-03 MED ORDER — ONDANSETRON HCL 4 MG PO TABS: 4.0000 mg | ORAL_TABLET | Freq: Four times a day (QID) | ORAL | 0 refills | Status: DC | PRN

## 2019-07-03 MED ORDER — POTASSIUM CHLORIDE CRYS ER 20 MEQ PO TBCR
40.0000 meq | EXTENDED_RELEASE_TABLET | Freq: Two times a day (BID) | ORAL | Status: DC
  Administered 2019-07-03: 40 meq via ORAL
  Filled 2019-07-03: qty 2

## 2019-07-03 MED ORDER — POLYETHYLENE GLYCOL 3350 17 G PO PACK: 17.0000 g | PACK | Freq: Every day | ORAL | 0 refills | Status: DC | PRN

## 2019-07-03 MED ORDER — TRAMADOL HCL 50 MG PO TABS: 50.0000 mg | ORAL_TABLET | Freq: Three times a day (TID) | ORAL | 0 refills | Status: DC | PRN

## 2019-07-03 MED FILL — traMADol HCL 50 MG TABS: 50 | 3 days supply | Qty: 10 | Fill #0

## 2019-07-03 NOTE — Discharge Summary (Signed)
Physician Discharge Summary  Kelly Roberson VEL:[id number] DOB: [birth year] DOA: 06/30/2019  PCP: Ladell Pier, MD  Admit date: 06/30/2019 Discharge date: 07/03/2019  Admitted From: Home Disposition: Home with Outpatient PT  Recommendations for Outpatient Follow-up:  1. Follow up with PCP in 1-2 weeks 2. Follow up with Medical Oncology within 1-2 weeks 3. Please obtain CMP/CBC, Mag, Pos on 07/05/19 and on 07/09/19 4. Please follow up on the following pending results:  Home Health: No; Recommended for outpatient PT Equipment/Devices: None   Discharge Condition: Stable  CODE STATUS: FULL CODE Diet recommendation: Heart Heatlhy Diet   Brief/Interim Summary: The patient is a 59 year old Caucasian female with a past medical history significant for but not limited to hypertension, IBS, recently diagnosed AML back in April 27th 2021 treated at [facility] and was admitted to the hospital here for fevers of 100.5.  Lab work showed severe pancytopenia and neutropenia.  Medical oncology was consulted and she was empirically started on IV cefepime.  She received 1 unit PRBCs and platelets.  Due to persistent head neck pain a CTA was ordered to rule out dissection showed no evidence of dissection.  Her headache resolved and her pancytopenia is improving.  She is no longer febrile neutropenic and WBC 6.2 and ANC is greater than 1500.  Medical oncology recommended discontinuing antibiotics and following up in outpatient setting and she is now deemed stable for discharge and PT recommend outpatient PT.  She is to follow-up with [name] and have repeat lab work on 07/05/2019 as well as 07/09/2019  Discharge Diagnoses:  Active Problems:   Essential hypertension   Mixed hyperlipidemia   AML (acute myeloblastic leukemia) (HCC)   Antineoplastic chemotherapy induced pancytopenia (HCC)   Pancytopenia due to antineoplastic chemotherapy (HCC)   Headache   Neutropenia (HCC)  Severe  pancytopenia due to bone marrow suppression from chemotherapy, improved  -Status post 1 unit PRBC irradiated blood, status post 1 unit platelet.  Her counts have improved today -Appreciate oncology input-on empiric cefepime now stopped  -Acyclovir twice daily, entecavir daily -IV fluids -Patient's WBC is now 6.2, hemoglobin/hematocrit is now 9.8/29.1, and platelet count is now 46 -Continue monitor and trend in the outpatient setting repeat CBC within 1 week and she is going be scheduled to follow-up with [name] for lab check on 07/05/2019 as well as 07/09/2019  Febrile Neutropenia  -Panculture= no growth today -UA-negative -Chest x-ray-negative, procalcitonin-negative -Empiric cefepime now stopped -Discussed with Medical Oncology and she does not need Abx at D/C   Persistent headache with bilateral neck discomfort -CT head-negative.  Pain control -CTA head and neck to rule out any dissection versus spontaneous bleeding due to severe thrombocytopenia; Showed "IMPRESSION: CT head:  1. No CT evidence of acute intracranial abnormality. 2. Stable mild generalized parenchymal atrophy. 3. 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 4. Tiny left maxillary sinus mucous retention cyst.  CTA neck:  1. The common carotid, internal carotid and vertebral arteries are patent within the neck without hemodynamically significant stenosis. Mild mixed plaque within the right carotid bifurcation, proximal right ICA and proximal left ICA. There is also minimal calcified plaque within the visualized aortic arch. 2. 8 mm ground-glass nodule within the imaged left lung apex. Initial follow-up with CT at 6-12 months is recommended to confirm persistence. If persistent, repeat CT is recommended every 2 years until 5 years of stability has been established. This recommendation follows the consensus statement: Guidelines  for  Management of Incidental Pulmonary Nodules Detected on CT Images: From the Fleischner Society 2017; Radiology 2017; [phone].  CTA head:  1. No intracranial large vessel occlusion or proximal high-grade arterial stenosis. 2. 1-2 mm infundibulum versus tiny aneurysm arising from the supraclinoid right ICA." -Headache has resolved and patient is at baseline.   Hypokalemia -Patient's potassium this morning was 3.3 -Replete with p.o. potassium chloride 40 mEq twice daily -Continue monitor and trend in outpatient setting   Essential hypertension -HCTZ 25 mg daily, losartan 25 mg daily  Depression/anxiety -Lexapro 10 mg daily, trazodone nightly  Peripheral neuropathy -Gabapentin 900 mg 3 times daily and 1200 mg qHS, daily folic acid  GERD -PPI daily  Discharge Instructions  Discharge Instructions    Call MD for:  difficulty breathing, headache or visual disturbances   Complete by: As directed    Call MD for:  extreme fatigue   Complete by: As directed    Call MD for:  hives   Complete by: As directed    Call MD for:  persistant dizziness or light-headedness   Complete by: As directed    Call MD for:  persistant nausea and vomiting   Complete by: As directed    Call MD for:  redness, tenderness, or signs of infection (pain, swelling, redness, odor or green/yellow discharge around incision site)   Complete by: As directed    Call MD for:  severe uncontrolled pain   Complete by: As directed    Call MD for:  temperature >100.4   Complete by: As directed    Diet - low sodium heart healthy   Complete by: As directed    Discharge instructions   Complete by: As directed    You were cared for by a hospitalist during your hospital stay. If you have any questions about your discharge medications or the care you received while you were in the hospital after you are discharged, you can call the unit and ask to speak with the hospitalist on call if the hospitalist that took  care of you is not available. Once you are discharged, your primary care physician will handle any further medical issues. Please note that NO REFILLS for any discharge medications will be authorized once you are discharged, as it is imperative that you return to your primary care physician (or establish a relationship with a primary care physician if you do not have one) for your aftercare needs so that they can reassess your need for medications and monitor your lab values.  Follow up with PCP and Medical Oncology. Take all medications as prescribed. If symptoms change or worsen please return to the ED for evaluation. Please have Labs redrawn on Friday7/2/21 and Tuesday 07/09/19 at the Oncology Clinic.   Increase activity slowly   Complete by: As directed      Allergies as of 07/03/2019      Reactions   Lisinopril Cough      Medication List    STOP taking these medications   levofloxacin 500 MG tablet Commonly known as: LEVAQUIN     TAKE these medications   acyclovir 200 MG capsule Commonly known as: ZOVIRAX Take 400 mg by mouth 2 (two) times daily.   cyclobenzaprine 5 MG tablet Commonly known as: FLEXERIL Take 1 tablet (5 mg total) by mouth 3 (three) times daily as needed for muscle spasms.   entecavir 0.5 MG tablet Commonly known as: BARACLUDE Take 0.5 mg by mouth daily.   escitalopram 10 MG tablet  Commonly known as: LEXAPRO Take 10 mg by mouth daily.   gabapentin 300 MG capsule Commonly known as: NEURONTIN Take 900-1,200 mg by mouth See admin instructions. Take 900mg  twice daily and 1200mg  at bedtime.   hydrochlorothiazide 25 MG tablet Commonly known as: HYDRODIURIL Take 25 mg by mouth daily.   LORazepam 1 MG tablet Commonly known as: ATIVAN Take 1 mg by mouth at bedtime as needed for anxiety or sleep.   losartan 25 MG tablet Commonly known as: Cozaar Take 1 tablet (25 mg total) by mouth daily.   multivitamin with minerals Tabs tablet Take 1 tablet by mouth  daily. Start taking on: July 04, 2019   ondansetron 4 MG tablet Commonly known as: ZOFRAN Take 1 tablet (4 mg total) by mouth every 6 (six) hours as needed for nausea.   polyethylene glycol 17 g packet Commonly known as: MIRALAX / GLYCOLAX Take 17 g by mouth daily as needed for moderate constipation or severe constipation.   senna-docusate 8.6-50 MG tablet Commonly known as: Senokot-S Take 1 tablet by mouth at bedtime.   traMADol 50 MG tablet Commonly known as: ULTRAM Take 1 tablet (50 mg total) by mouth every 8 (eight) hours as needed (mild pain).   traZODone 50 MG tablet Commonly known as: DESYREL Take 50 mg by mouth at bedtime as needed for sleep.       Follow-up [facility].   Why: [phone]  Call number to schedule rides to and from your appointments to the cancer center.             Allergies  Allergen Reactions  . Lisinopril Cough    Consultations:  Medical oncology  Procedures/Studies: CT ANGIO HEAD W OR WO CONTRAST  Result Date: 07/02/2019 CLINICAL DATA:  Headache, acute, normal neuro exam. Additional provided: Headache radiates down both arms and into neck with numbness/weakness in both legs. EXAM: CT ANGIOGRAPHY HEAD AND NECK TECHNIQUE: Multidetector CT imaging of the head and neck was performed using the standard protocol during bolus administration of intravenous contrast. Multiplanar CT image reconstructions and MIPs were obtained to evaluate the vascular anatomy. Carotid stenosis measurements (when applicable) are obtained utilizing NASCET criteria, using the distal internal carotid diameter as the denominator. CONTRAST:  165mL OMNIPAQUE IOHEXOL 350 MG/ML SOLN COMPARISON:  Head CT performed 1 day prior 07/01/2019 FINDINGS: CT HEAD FINDINGS Brain: Stable, mild generalized parenchymal atrophy. 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 There is no acute  intracranial hemorrhage. No demarcated cortical infarct. No extra-axial fluid collection. No evidence of intracranial mass. No midline shift. Vascular: Reported below. Skull: Normal. Negative for fracture or focal lesion. Sinuses: Tiny left maxillary sinus mucous retention cyst. No significant mastoid effusion Orbits: No acute abnormality. Review of the MIP images confirms the above findings CTA NECK FINDINGS Aortic arch: Standard aortic branching. Mild calcified plaque within the visualized aortic arch. Streak artifact from a dense right-sided contrast bolus limits evaluation of the mid to distal right subclavian artery. No hemodynamically significant innominate or proximal subclavian artery stenosis. Right carotid system: CCA and ICA patent within the neck without significant stenosis (50% or greater). Mild mixed plaque within the carotid bifurcation and proximal ICA. Left carotid system: CCA and ICA patent within the neck without significant stenosis (50% or greater). Minimal mixed plaque within the proximal ICA. Vertebral arteries: The vertebral arteries are patent within the neck bilaterally without significant stenosis. The left vertebral artery is slightly  dominant. Skeleton: No acute bony abnormality or aggressive osseous lesion. Other neck: No neck mass or cervical lymphadenopathy. Thyroid unremarkable. Upper chest: 8 mm ground-glass nodule within the left upper lobe (series 13, image 331). Review of the MIP images confirms the above findings CTA HEAD FINDINGS Anterior circulation: The intracranial internal carotid arteries are patent without significant stenosis. The M1 middle cerebral arteries are patent without significant stenosis. No M2 proximal branch occlusion or high-grade proximal stenosis is identified. The anterior cerebral arteries are patent without significant proximal stenosis. There is a 1-2 mm vascular protrusion arising from the supraclinoid right ICA which may reflect a tiny aneurysm or  infundibulum (series 15, image 92). A 1-2 mm vascular protrusion arising from the supraclinoid left ICA appears to reflect a small infundibulum (series 15, image 113). Posterior circulation: The intracranial vertebral arteries are patent without significant stenosis, as is the basilar artery. The posterior cerebral arteries are patent bilaterally without significant proximal stenosis posterior communicating arteries are hypoplastic or absent bilaterally. Venous sinuses: Within limitations of contrast timing, no convincing thrombus. Anatomic variants: As described Review of the MIP images confirms the above findings IMPRESSION: CT head: 1. No CT evidence of acute intracranial abnormality. 2. Stable mild generalized parenchymal atrophy. 3. 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 4. Tiny left maxillary sinus mucous retention cyst. CTA neck: 1. The common carotid, internal carotid and vertebral arteries are patent within the neck without hemodynamically significant stenosis. Mild mixed plaque within the right carotid bifurcation, proximal right ICA and proximal left ICA. There is also minimal calcified plaque within the visualized aortic arch. 2. 8 mm ground-glass nodule within the imaged left lung apex. Initial follow-up with CT at 6-12 months is recommended to confirm persistence. If persistent, repeat CT is recommended every 2 years until 5 years of stability has been established. This recommendation follows the consensus statement: Guidelines for Management of Incidental Pulmonary Nodules Detected on CT Images: From the Fleischner Society 2017; Radiology 2017; 284:228-243. CTA head: 1. No intracranial large vessel occlusion or proximal high-grade arterial stenosis. 2. 1-2 mm infundibulum versus tiny aneurysm arising from the supraclinoid right ICA. Electronically Signed   By: Kellie Simmering DO   On: 07/02/2019 12:47   CT HEAD WO  CONTRAST  Result Date: 07/01/2019 CLINICAL DATA:  Headache with intracranial hemorrhage suspected. Pancytopenia. EXAM: CT HEAD WITHOUT CONTRAST TECHNIQUE: Contiguous axial images were obtained from the base of the skull through the vertex without intravenous contrast. COMPARISON:  None. FINDINGS: Brain: No evidence of acute cortical infarction, hemorrhage, hydrocephalus, extra-axial collection or mass lesion/mass effect. 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 Vascular: No hyperdense vessel or unexpected calcification. Skull: Normal. Negative for fracture or focal lesion. Sinuses/Orbits: Negative.  No visualized sinusitis. IMPRESSION: No acute finding.  Negative for intracranial hemorrhage. Electronically Signed   By: Monte Fantasia M.D.   On: 07/01/2019 05:23   CT ANGIO NECK W OR WO CONTRAST  Result Date: 07/02/2019 CLINICAL DATA:  Headache, acute, normal neuro exam. Additional provided: Headache radiates down both arms and into neck with numbness/weakness in both legs. EXAM: CT ANGIOGRAPHY HEAD AND NECK TECHNIQUE: Multidetector CT imaging of the head and neck was performed using the standard protocol during bolus administration of intravenous contrast. Multiplanar CT image reconstructions and MIPs were obtained to evaluate the vascular anatomy. Carotid stenosis measurements (when applicable) are obtained utilizing NASCET criteria, using the distal internal carotid diameter as  the denominator. CONTRAST:  17mL OMNIPAQUE IOHEXOL 350 MG/ML SOLN COMPARISON:  Head CT performed 1 day prior 07/01/2019 FINDINGS: CT HEAD FINDINGS Brain: Stable, mild generalized parenchymal atrophy. 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 There is no acute intracranial hemorrhage. No demarcated cortical infarct. No extra-axial fluid collection. No evidence of intracranial mass. No midline shift. Vascular:  Reported below. Skull: Normal. Negative for fracture or focal lesion. Sinuses: Tiny left maxillary sinus mucous retention cyst. No significant mastoid effusion Orbits: No acute abnormality. Review of the MIP images confirms the above findings CTA NECK FINDINGS Aortic arch: Standard aortic branching. Mild calcified plaque within the visualized aortic arch. Streak artifact from a dense right-sided contrast bolus limits evaluation of the mid to distal right subclavian artery. No hemodynamically significant innominate or proximal subclavian artery stenosis. Right carotid system: CCA and ICA patent within the neck without significant stenosis (50% or greater). Mild mixed plaque within the carotid bifurcation and proximal ICA. Left carotid system: CCA and ICA patent within the neck without significant stenosis (50% or greater). Minimal mixed plaque within the proximal ICA. Vertebral arteries: The vertebral arteries are patent within the neck bilaterally without significant stenosis. The left vertebral artery is slightly dominant. Skeleton: No acute bony abnormality or aggressive osseous lesion. Other neck: No neck mass or cervical lymphadenopathy. Thyroid unremarkable. Upper chest: 8 mm ground-glass nodule within the left upper lobe (series 13, image 331). Review of the MIP images confirms the above findings CTA HEAD FINDINGS Anterior circulation: The intracranial internal carotid arteries are patent without significant stenosis. The M1 middle cerebral arteries are patent without significant stenosis. No M2 proximal branch occlusion or high-grade proximal stenosis is identified. The anterior cerebral arteries are patent without significant proximal stenosis. There is a 1-2 mm vascular protrusion arising from the supraclinoid right ICA which may reflect a tiny aneurysm or infundibulum (series 15, image 92). A 1-2 mm vascular protrusion arising from the supraclinoid left ICA appears to reflect a small infundibulum (series  15, image 113). Posterior circulation: The intracranial vertebral arteries are patent without significant stenosis, as is the basilar artery. The posterior cerebral arteries are patent bilaterally without significant proximal stenosis posterior communicating arteries are hypoplastic or absent bilaterally. Venous sinuses: Within limitations of contrast timing, no convincing thrombus. Anatomic variants: As described Review of the MIP images confirms the above findings IMPRESSION: CT head: 1. No CT evidence of acute intracranial abnormality. 2. Stable mild generalized parenchymal atrophy. 3. 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 4. Tiny left maxillary sinus mucous retention cyst. CTA neck: 1. The common carotid, internal carotid and vertebral arteries are patent within the neck without hemodynamically significant stenosis. Mild mixed plaque within the right carotid bifurcation, proximal right ICA and proximal left ICA. There is also minimal calcified plaque within the visualized aortic arch. 2. 8 mm ground-glass nodule within the imaged left lung apex. Initial follow-up with CT at 6-12 months is recommended to confirm persistence. If persistent, repeat CT is recommended every 2 years until 5 years of stability has been established. This recommendation follows the consensus statement: Guidelines for Management of Incidental Pulmonary Nodules Detected on CT Images: From the Fleischner Society 2017; Radiology 2017; 284:228-243. CTA head: 1. No intracranial large vessel occlusion or proximal high-grade arterial stenosis. 2. 1-2 mm infundibulum versus tiny aneurysm arising from the supraclinoid right ICA. Electronically Signed   By: Kellie Simmering DO   On:  07/02/2019 12:47   DG Chest Port 1 View  Result Date: 06/30/2019 CLINICAL DATA:  Fever. EXAM: PORTABLE CHEST 1 VIEW COMPARISON:  None. FINDINGS: The heart size and mediastinal contours are  within normal limits. Both lungs are clear. The visualized skeletal structures are unremarkable. IMPRESSION: No active disease. Electronically Signed   By: Constance Holster M.D.   On: 06/30/2019 23:49     Subjective: Examined at bedside and she is feeling well and states that her headache is much improved.  No nausea or vomiting.  Blood counts are improved and she is stable from medical standpoint as well as an oncology standpoint to be discharged.  She will need to follow-up with PCP as well as medical oncology she understands agrees with the plan of care.  She has no other complaints or concerns at this time is ready for discharge.  Discharge Exam: Vitals:   07/03/19 0702 07/03/19 1352  BP:  120/69  Pulse: 70 82  Resp:  18  Temp:  98.6 F (37 C)  SpO2:  100%   Vitals:   07/03/19 0616 07/03/19 0632 07/03/19 0702 07/03/19 1352  BP: 105/61 (!) 113/55  120/69  Pulse: 82 (!) 115 70 82  Resp: 18 19  18   Temp: 98.1 F (36.7 C)   98.6 F (37 C)  TempSrc: Oral   Oral  SpO2: 93% 96%  100%  Weight:      Height:       General: Pt is alert, awake, not in acute distress Cardiovascular: RRR, S1/S2 +, no rubs, no gallops Respiratory: CTA bilaterally, no wheezing, no rhonchi Abdominal: Soft, NT, ND, bowel sounds + Extremities: no edema, no cyanosis  The results of significant diagnostics from this hospitalization (including imaging, microbiology, ancillary and laboratory) are listed below for reference.    Microbiology: Recent Results (from the past 240 hour(s))  Blood Culture (routine x 2)     Status: None (Preliminary result)   Collection Time: 06/30/19 11:26 PM   Specimen: BLOOD  Result Value Ref Range Status   Specimen Description   Final    BLOOD RIGHT ANTECUBITAL Performed at [facility], [place] [address] [place], [place] [address]    Special Requests   Final    BOTTLES DRAWN AEROBIC AND ANAEROBIC Blood Culture results may not be optimal due to an excessive volume of  blood received in culture bottles Performed at [facility] [address] [place], [place] [address]    Culture   Final    NO GROWTH 2 DAYS Performed at [facility] [address] [place], [place] [address]    Report Status PENDING  Incomplete  Urine culture     Status: Abnormal   Collection Time: 07/01/19  1:30 AM   Specimen: Urine, Random  Result Value Ref Range Status   Specimen Description   Final    URINE, RANDOM Performed at [facility] [address] [place], [place] [address]    Special Requests   Final    NONE Performed at [facility], [place] [address] [place], [place] [address]    Culture (A)  Final    <10,000 COLONIES/mL INSIGNIFICANT GROWTH Performed at [facility] [address], [place], [place] [address]    Report Status 07/02/2019 FINAL  Final  SARS Coronavirus 2 by RT PCR (hospital order, performed in [facility] hospital lab) Nasopharyngeal Nasopharyngeal Swab     Status: None   Collection  Time: 07/01/19  3:16 AM   Specimen: Nasopharyngeal Swab  Result Value Ref Range Status   SARS Coronavirus 2 NEGATIVE NEGATIVE Final    Comment: (NOTE) SARS-CoV-2 target nucleic acids are NOT DETECTED.  The SARS-CoV-2 RNA is generally detectable in upper and lower respiratory specimens during the acute phase of infection. The lowest concentration of SARS-CoV-2 viral copies this assay can detect is 250 copies / mL. A negative result does not preclude SARS-CoV-2 infection and should not be used as the sole basis for treatment or other patient management decisions.  A negative result may occur with improper specimen collection / handling, submission of specimen other than nasopharyngeal swab, presence of viral mutation(s) within the areas targeted by this assay, and inadequate number of viral copies (<250 copies / mL). A negative result must be combined with clinical observations, patient  history, and epidemiological information.  Fact Sheet for Patients:   StrictlyIdeas.no  Fact Sheet for Healthcare Providers: BankingDealers.co.za  This test is not yet approved or  cleared by the [place] FDA and has been authorized for detection and/or diagnosis of SARS-CoV-2 by FDA under an Emergency Use Authorization (EUA).  This EUA will remain in effect (meaning this test can be used) for the duration of the COVID-19 declaration under Section 564(b)(1) of the Act, 21 U.S.C. section 360bbb-3(b)(1), unless the authorization is terminated or revoked sooner.  Performed at [facility], [place] [address] [place], [place] [address]     Labs: BNP (last 3 results) No results for input(s): BNP in the last 8760 hours. Basic Metabolic Panel: Recent Labs  Lab 06/27/19 1109 06/30/19 2326 07/01/19 0040 07/02/19 0542 07/03/19 0609  NA 140 136 136 138 137  K 3.7 3.5 3.4* 3.7 3.3*  CL 102 98 97* 105 103  CO2 25 27  --  24 26  GLUCOSE 100* 122* 122* 92 93  BUN 12 7 4* 6 6  CREATININE 0.61 0.57 0.40* 0.44 0.52  CALCIUM 9.9 9.0  --  8.9 9.0  MG  --   --   --  1.5* 2.1  PHOS  --   --   --   --  3.9   Liver Function Tests: Recent Labs  Lab 06/27/19 1109 06/30/19 2326  AST 36 23  ALT 37 24  ALKPHOS 108 102  BILITOT 1.4* 1.4*  PROT 6.7 6.2*  ALBUMIN 3.5 3.4*   Recent Labs  Lab 06/30/19 2326  LIPASE 18   No results for input(s): AMMONIA in the last 168 hours. CBC: Recent Labs  Lab 06/27/19 1109 06/30/19 2326 07/01/19 0040 07/02/19 0542 07/03/19 0609  WBC 0.4* 0.6*  --  2.1* 6.2  NEUTROABS 0.0* 0.0*  --  0.7* 2.6  HGB 11.4* 9.3* 8.8* 9.6* 9.8*  HCT 34.2* 27.7* 26.0* 28.1* 29.1*  MCV 95.5 93.0  --  91.8 92.7  PLT 6* <5*  --  27* 46*   Cardiac Enzymes: No results for input(s): CKTOTAL, CKMB, CKMBINDEX, TROPONINI in the last 168 hours. BNP: Invalid input(s): POCBNP CBG: No results for input(s):  GLUCAP in the last 168 hours. D-Dimer No results for input(s): DDIMER in the last 72 hours. Hgb A1c No results for input(s): HGBA1C in the last 72 hours. Lipid Profile No results for input(s): CHOL, HDL, LDLCALC, TRIG, CHOLHDL, LDLDIRECT in the last 72 hours. Thyroid function studies No results for input(s): TSH, T4TOTAL, T3FREE, THYROIDAB in the last 72 hours.  Invalid input(s): FREET3 Anemia work up No results for input(s): VITAMINB12,  FOLATE, FERRITIN, TIBC, IRON, RETICCTPCT in the last 72 hours. Urinalysis    Component Value Date/Time   COLORURINE YELLOW 07/01/2019 0130   APPEARANCEUR CLEAR 07/01/2019 0130   LABSPEC 1.006 07/01/2019 0130   PHURINE 7.0 07/01/2019 0130   GLUCOSEU NEGATIVE 07/01/2019 0130   HGBUR NEGATIVE 07/01/2019 0130   BILIRUBINUR NEGATIVE 07/01/2019 0130   KETONESUR NEGATIVE 07/01/2019 0130   PROTEINUR NEGATIVE 07/01/2019 0130   NITRITE NEGATIVE 07/01/2019 0130   LEUKOCYTESUR NEGATIVE 07/01/2019 0130   Sepsis Labs Invalid input(s): PROCALCITONIN,  WBC,  LACTICIDVEN Microbiology Recent Results (from the past 240 hour(s))  Blood Culture (routine x 2)     Status: None (Preliminary result)   Collection Time: 06/30/19 11:26 PM   Specimen: BLOOD  Result Value Ref Range Status   Specimen Description   Final    BLOOD RIGHT ANTECUBITAL Performed at [facility], [place] [address] [place], [place] [address]    Special Requests   Final    BOTTLES DRAWN AEROBIC AND ANAEROBIC Blood Culture results may not be optimal due to an excessive volume of blood received in culture bottles Performed at [facility] [address], [place], [place] [address]    Culture   Final    NO GROWTH 2 DAYS Performed at [facility] [address] [place], [place] [address]    Report Status PENDING  Incomplete  Urine culture     Status: Abnormal   Collection Time: 07/01/19  1:30 AM   Specimen: Urine, Random  Result Value Ref Range Status    Specimen Description   Final    URINE, RANDOM Performed at [facility] [address] [place], [place] [address]    Special Requests   Final    NONE Performed at [facility], [place] [address], [place], [place] [address]    Culture (A)  Final    <10,000 COLONIES/mL INSIGNIFICANT GROWTH Performed at [facility] [address] [place],  [address]    Report Status 07/02/2019 FINAL  Final  SARS Coronavirus 2 by RT PCR (hospital order, performed in [facility] hospital lab) Nasopharyngeal Nasopharyngeal Swab     Status: None   Collection Time: 07/01/19  3:16 AM   Specimen: Nasopharyngeal Swab  Result Value Ref Range Status   SARS Coronavirus 2 NEGATIVE NEGATIVE Final    Comment: (NOTE) SARS-CoV-2 target nucleic acids are NOT DETECTED.  The SARS-CoV-2 RNA is generally detectable in upper and lower respiratory specimens during the acute phase of infection. The lowest concentration of SARS-CoV-2 viral copies this assay can detect is 250 copies / mL. A negative result does not preclude SARS-CoV-2 infection and should not be used as the sole basis for treatment or other patient management decisions.  A negative result may occur with improper specimen collection / handling, submission of specimen other than nasopharyngeal swab, presence of viral mutation(s) within the areas targeted by this assay, and inadequate number of viral copies (<250 copies / mL). A negative result must be combined with clinical observations, patient history, and epidemiological information.  Fact Sheet for Patients:   StrictlyIdeas.no  Fact Sheet for Healthcare Providers: BankingDealers.co.za  This test is not yet approved or  cleared by the [place] FDA and has been authorized for detection and/or diagnosis of SARS-CoV-2 by FDA under an Emergency Use Authorization (EUA).  This EUA will remain in effect  (meaning this test can be used) for the duration of the COVID-19  declaration under Section 564(b)(1) of the Act, 21 U.S.C. section 360bbb-3(b)(1), unless the authorization is terminated or revoked sooner.  Performed at [facility], [place] [address] [place], [place] [address]     Time coordinating discharge: 35 minutes  SIGNED:  Kerney Elbe, DO [facility] 07/03/2019, 2:07 PM Pager is on [place]  If 7PM-7AM, please contact night-coverage [email or web address]

## 2019-07-03 NOTE — TOC Initial Note (Signed)
Transition of Care [facility]) - Initial/Assessment Note    Patient Details  Name: Kelly Roberson MRN: [id number] Date of Birth: 01/17/1960  [facility]) CM/SW Contact:    Lynnell Catalan, RN Phone Number: 07/03/2019, 12:00 PM  Clinical Narrative:                 Pt from home alone. TOC consult for transportation. This CM spoke with pt at bedside about transport needs. She states she will need transportation to and from the cancer center 3 times weekly and home from the hospital when she discharges. This CM contacted the CSWs at the cancer center to arrange transportation when pt discharges. When pt discharges from the hospital the nursing staff on the department can call [phone] for pt transport home.    Expected Discharge Plan: Home/Self Care Barriers to Discharge: No Barriers Identified   Expected Discharge Plan and Services Expected Discharge Plan: Home/Self Care       Living arrangements for the past 2 months: Single Family Home                   Prior Living Arrangements/Services Living arrangements for the past 2 months: Single Family Home Lives with:: Self   Do you feel safe going back to the place where you live?: Yes               Activities of Daily Living Home Assistive Devices/Equipment: Eyeglasses, Environmental consultant (specify type), Shower chair with back ADL Screening (condition at time of admission) Patient's cognitive ability adequate to safely complete daily activities?: Yes Is the patient deaf or have difficulty hearing?: No Does the patient have difficulty seeing, even when wearing glasses/contacts?: No Does the patient have difficulty concentrating, remembering, or making decisions?: Yes Patient able to express need for assistance with ADLs?: Yes Does the patient have difficulty dressing or bathing?: No Independently performs ADLs?: No Communication: Independent Dressing (OT): Independent Grooming: Independent Feeding: Independent Bathing:  Independent Toileting: Needs assistance Is this a change from baseline?: Pre-admission baseline In/Out Bed: Needs assistance Is this a change from baseline?: Pre-admission baseline Walks in Home: Needs assistance Is this a change from baseline?: Pre-admission baseline Does the patient have difficulty walking or climbing stairs?: Yes Weakness of Legs: Both Weakness of Arms/Hands: Both   Emotional Assessment       Orientation: : Oriented to Self, Oriented to Place, Oriented to  Time, Oriented to Situation      Admission diagnosis:  Pancytopenia due to antineoplastic chemotherapy ([place]) [D61.810, T45.1X5A] Pancytopenia ([place]) [L93.790] Patient Active Problem List   Diagnosis Date Noted  . Antineoplastic chemotherapy induced pancytopenia ([place]) 07/01/2019  . Pancytopenia due to antineoplastic chemotherapy ([place]) 07/01/2019  . Headache 07/01/2019  . Neutropenia ([place]) 07/01/2019  . Macrocytic anemia 05/01/2019  . AML (acute myeloblastic leukemia) ([place]) 05/01/2019  . Neck pain on right side 05/01/2019  . Leukocytosis 05/01/2019  . Bicytopenia 04/30/2019  . Macrocytosis without anemia 02/12/2019  . Family history of hemochromatosis 02/12/2019  . Mixed hyperlipidemia 01/18/2019  . Abnormal LFTs 01/18/2019  . Dandruff in adult 01/18/2019  . Hair loss 01/18/2019  . Muscle weakness 01/18/2019  . Peripheral neuropathy 01/18/2019  . Other fatigue 01/18/2019  . Essential hypertension 06/01/2018  . Palpitations 06/01/2018  . Anxiety disorder 06/01/2018  . Insomnia 06/01/2018  . Irritable bowel syndrome with diarrhea 06/01/2018  . Barrett's esophagus with dysplasia 06/01/2018   PCP:  Ladell Pier, MD Pharmacy:   [place] [place] -  [place] [address] [place] [address] Phone: [phone] Fax: [phone]     Social Determinants of Health (SDOH) Interventions    Readmission Risk Interventions No flowsheet data  found.

## 2019-07-03 NOTE — Progress Notes (Signed)
[facility] CSW Progress Notes  Message from [employer].  Pt needs assistance w transportation to [facility] appts.  Referral sent to [facility] w information on next appt on July 2.  [place] [employer] should reach out to patient and enroll her in program.  [employer] is responsible for scheduling and confirming rides w this service - she needs to call [phone] to schedule.  Estrellita Ludwig asked to provide this information to patient.  Edwyna Shell, LCSW Clinical Social Worker Phone:  [phone]

## 2019-07-03 NOTE — Evaluation (Signed)
Physical Therapy Evaluation Patient Details Name: Kelly Roberson MRN: [id number] DOB: January 07, 1960 Today's Date: 07/03/2019   History of Present Illness  Kelly Roberson 59 y.o. female with medical history significant for AML s/p induction therapy and 1 cycle of consolidation who presents with concern for neutropenic fever.  She has PMH of anxiety , HTN, gastric bypass, and bil ankle ORIF.  Clinical Impression  Pt admitted for above diagnosis.  She was able to complete all mobility independently or with supervision for stairs.  She reports she is at her baseline.  No acute PT indicated.  Pt does report mild difficulty with sit to stand and easily fatigued at times at home - recommended outpt PT for general strengthening if pt interested.     Follow Up Recommendations Outpatient PT (if desired by pt)    Equipment Recommendations  None recommended by PT    Recommendations for Other Services       Precautions / Restrictions Precautions Precautions: None      Mobility  Bed Mobility Overal bed mobility: Independent             General bed mobility comments: independent with bed mobility.  Transfers Overall transfer level: Modified independent Equipment used: None             General transfer comment: Increased time for sit to stand - reports mild difficulty at baseline  Ambulation/Gait Ambulation/Gait assistance: Supervision Gait Distance (Feet): 100 Feet Assistive device: None Gait Pattern/deviations: Decreased stride length;Antalgic;Decreased stance time - left Gait velocity: decreased   General Gait Details: Reports antalgic gait due to chronic knee pain and previous bil ankle fx; steady no LOB  Stairs Stairs: Yes Stairs assistance: Min guard Stair Management: One rail Left;Sideways Number of Stairs: 2 General stair comments: steady without difficulty  Wheelchair Mobility    Modified Rankin (Stroke Patients Only)       Balance Overall balance  assessment: Needs assistance   Sitting balance-Leahy Scale: Normal       Standing balance-Leahy Scale: Good               High level balance activites: Head turns;Turns;Direction changes               Pertinent Vitals/Pain Pain Assessment: No/denies pain    Home Living Family/patient expects to be discharged to:: Private residence Living Arrangements: Alone Available Help at Discharge: Available PRN/intermittently Type of Home: House Home Access: Stairs to enter   [employer] of Steps: 2 Home Layout: One level Home Equipment: [place] - 4 wheels;Tub bench Additional Comments: low toilet    Prior Function Level of Independence: Independent         Comments: REports not using rollator much.     Hand Dominance   Dominant Hand: Right    Extremity/Trunk Assessment   Upper Extremity Assessment Upper Extremity Assessment: Defer to OT evaluation    Lower Extremity Assessment Lower Extremity Assessment: RLE deficits/detail;LLE deficits/detail RLE Deficits / Details: ROM WFL; MMT 4+/5 LLE Deficits / Details: ROM WFL; MMT 4+/5    Cervical / Trunk Assessment Cervical / Trunk Assessment: Normal  Communication   Communication: No difficulties  Cognition Arousal/Alertness: Awake/alert Behavior During Therapy: WFL for tasks assessed/performed Overall Cognitive Status: Within Functional Limits for tasks assessed                                        General Comments General  comments (skin integrity, edema, etc.): Pt educated on DME to assist at home including cane for unlevel surfaces and toielt riser.  Also discussed could pursure outpt PT for general strengthening if she desired - recommended following up twith PCP or oncologist for order if desired.    Exercises     Assessment/Plan    PT Assessment Patent does not need any further PT services  PT Problem List         PT Treatment Interventions      PT Goals (Current  goals can be found in the Care Plan section)  Acute Rehab PT Goals Patient Stated Goal: return home PT Goal Formulation: All assessment and education complete, DC therapy Potential to Achieve Goals: Good    Frequency     Barriers to discharge        Co-evaluation PT/OT/SLP Co-Evaluation/Treatment: Yes Reason for Co-Treatment: Other (comment) (initial eval and pt imminent d/c leaving shortly) PT goals addressed during session: Mobility/safety with mobility OT goals addressed during session: ADL's and self-care       AM-PAC PT "6 Clicks" Mobility  Outcome Measure Help needed turning from your back to your side while in a flat bed without using bedrails?: None Help needed moving from lying on your back to sitting on the side of a flat bed without using bedrails?: None Help needed moving to and from a bed to a chair (including a wheelchair)?: None Help needed standing up from a chair using your arms (e.g., wheelchair or bedside chair)?: None Help needed to walk in hospital room?: None Help needed climbing 3-5 steps with a railing? : None 6 Click Score: 24    End of Session Equipment Utilized During Treatment: Gait belt Activity Tolerance: Patient tolerated treatment well Patient left: in bed;with call bell/phone within reach Nurse Communication: Mobility status      Time: 5701-7793 PT Time Calculation (min) (ACUTE ONLY): 18 min   Charges:   PT Evaluation $PT Eval Low Complexity: 1 Low          Keene Gilkey, PT Acute Rehab Services Pager [phone] [facility] [phone]    Karlton Lemon 07/03/2019, 3:39 PM

## 2019-07-03 NOTE — Evaluation (Addendum)
Occupational Therapy Evaluation Patient Details Name: Kelly Roberson MRN: [id number] DOB: 09/29/1960 Today's Date: 07/03/2019    History of Present Illness On 07/01/2019 the patient presented the emergency department with a temperature of 100.4 F and no focal infectious symptoms.  Due to concern for neutropenic fever the patient was admitted.   Clinical Impression   Ms. Kelly Roberson is a 59 year old woman who presents without functional deficits and demonstrates ability to perform ADLs and functional mobility. Patient does require increased time to rise from low surface and reports needing to pull herself up from toilet at home. Patient reports using a rollator PRN at home and a tub bench.Therapist recommended toilet riser as patient not interested in [facility]. Also recommended OP therapy for generalized strengthening to maintain strength and abilities. No further OT needs.    Follow Up Recommendations  No OT follow up    Equipment Recommendations  Toilet riser    Recommendations for Other Services       Precautions / Restrictions Precautions Precautions: Fall      Mobility Bed Mobility Overal bed mobility: Independent             General bed mobility comments: independent with bed mobility.  Transfers Overall transfer level: Modified independent               General transfer comment: Independent with ambulation and no device. Needs increased time to stand from low surface and reports mild difficulty at baseline. Demonstrated ability to traverse steps sideways while holding onto rail.    Balance                                           ADL either performed or assessed with clinical judgement   ADL Overall ADL's : Modified independent                                       General ADL Comments: Patient reports dressing herself and toileting independently. Needs increased time and needs hand hold assistance to rise from low  seat.     Vision   Vision Assessment?: No apparent visual deficits     Perception     Praxis      Pertinent Vitals/Pain Pain Assessment: No/denies pain     Hand Dominance Right   Extremity/Trunk Assessment Upper Extremity Assessment Upper Extremity Assessment:  (4+/5 shoulder strength, 4/5 elbow strength, 5//5 wrist, 4/5 grip)   Lower Extremity Assessment Lower Extremity Assessment: Defer to PT evaluation   Cervical / Trunk Assessment Cervical / Trunk Assessment: Normal   Communication Communication Communication: No difficulties   Cognition Arousal/Alertness: Awake/alert Behavior During Therapy: WFL for tasks assessed/performed Overall Cognitive Status: Within Functional Limits for tasks assessed                                     General Comments       Exercises     Shoulder Instructions      Home Living Family/patient expects to be discharged to:: Private residence Living Arrangements: Alone Available Help at Discharge: Available PRN/intermittently Type of Home: House Home Access: Stairs to enter [employer] of Steps: 2   Home Layout: One level     Bathroom  Shower/Tub: Teacher, early years/pre: Standard     Home Equipment: Environmental consultant - 4 wheels;Tub bench   Additional Comments: low toilet      Prior Functioning/Environment Level of Independence: Independent        Comments: REports not using rollator much.        OT Problem List:        OT Treatment/Interventions:      OT Goals(Current goals can be found in the care plan section) Acute Rehab OT Goals OT Goal Formulation: All assessment and education complete, DC therapy  OT Frequency:     Barriers to D/C:            Co-evaluation PT/OT/SLP Co-Evaluation/Treatment: Yes Reason for Co-Treatment: To address functional/ADL transfers (imminent discharge) PT goals addressed during session: Mobility/safety with mobility OT goals addressed during  session: ADL's and self-care      AM-PAC OT "6 Clicks" Daily Activity     Outcome Measure Help from another person eating meals?: None Help from another person taking care of personal grooming?: None Help from another person toileting, which includes using toliet, bedpan, or urinal?: None Help from another person bathing (including washing, rinsing, drying)?: None Help from another person to put on and taking off regular upper body clothing?: None Help from another person to put on and taking off regular lower body clothing?: None 6 Click Score: 24   End of Session Equipment Utilized During Treatment: Gait belt Nurse Communication:  (okay to see per nursing)  Activity Tolerance: Patient tolerated treatment well Patient left: in bed;with call bell/phone within reach (seated at side of bed)  OT Visit Diagnosis: Muscle weakness (generalized) (M62.81)                Time: 3220-2542 OT Time Calculation (min): 18 min Charges:  OT General Charges $OT Visit: 1 Visit OT Evaluation $OT Eval Low Complexity: 1 Low  Chloeanne Poteet, OTR/L [place]  Office [phone] Pager: [phone]   Lenward Chancellor 07/03/2019, 3:30 PM

## 2019-07-03 NOTE — Progress Notes (Signed)
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 Found to have pancytopenia and peripheral blasts with intracellular inclusions.  2) 05/01/2019: transferred to [facility] 3) 05/04/2019: patient started induction therapy for AML with 7+3 and gemtuzumab on Day 4. 4) 05/16/2019: repeat Bmbx shows no residual leukemia 5) 06/17/2019: patient returned for Cycle 1 Consolidation with cytarabine and gemtuzumab 6) 06/24/2019: presents to reconnect with [facility] for co-managed care. 7) 07/15/2019: anticipated Cycle 2 of consolidation therapy   HISTORY OF PRESENTING ILLNESS:  Kelly Roberson 59 y.o. female with medical history significant for AML s/p induction therapy and 1 cycle of consolidation who presents with concern for neutropenic fever. Kelly Roberson was previously seen on clinic on 06/24/2019 at which time she received a PEG filgrastim shot in order to help stimulate her bone marrow to produce any white blood cells.  She had an [place] of 0.0 with a platelet count initially of 76, however the platelets trended downward to less than 5 on last check 06/27/2019.  On 07/01/2019 the patient presented the emergency department with a temperature of 100.4 F and no focal infectious symptoms.  Due to concern for neutropenic fever the patient was admitted.  Interval History: --ANC rose to 2.6, WBC 6.2 --Plt rising, now at 46. Hgb stable at 9.8 --no fevers, no culture data to suggest localized infection. Patient is otherwise asymptomatic today   MEDICAL HISTORY:  Past Medical History:  Diagnosis Date  . Anxiety   . Hypertension   . IBS (irritable bowel syndrome)     SURGICAL HISTORY: Past Surgical History:  Procedure  Laterality Date  . GASTRIC BYPASS  2013  . NISSEN FUNDOPLICATION  9030   Reversed in 2013  . ORIF ANKLE FRACTURE Left 06/2017  . PLACEMENT OF BREAST IMPLANTS      SOCIAL HISTORY: Social History   Socioeconomic History  . Marital status: Divorced    Spouse name: Not on file  . Number of children: Not on file  . Years of education: Not on file  . Highest education level: Not on file  Occupational History  . Not on file  Tobacco Use  . Smoking status: Former Research scientist (life sciences)  . Smokeless tobacco: Never Used  Substance and Sexual Activity  . Alcohol use: Not Currently    Comment: occasionally   . Drug use: Not Currently  . Sexual activity: Not Currently  Other Topics Concern  . Not on file  Social History Narrative  . Not on file   Social Determinants of Health   Financial Resource Strain:   . Difficulty of Paying Living Expenses:   Food Insecurity:   . Worried About Charity fundraiser in the Last Year:   . Arboriculturist in the Last Year:   Transportation Needs:   . Film/video editor (Medical):   Marland Kitchen Lack of Transportation (Non-Medical):   Physical Activity:   . Days of Exercise per Week:   . Minutes of Exercise per Session:   Stress:   . Feeling of Stress :   Social Connections:   . Frequency of Communication with Friends and Family:   . Frequency of Social Gatherings with Friends and Family:   . Attends Religious Services:   . Active Member of Clubs or Organizations:   .  Attends Archivist Meetings:   Marland Kitchen Marital Status:   Intimate Partner Violence:   . Fear of Current or Ex-Partner:   . Emotionally Abused:   Marland Kitchen Physically Abused:   . Sexually Abused:     FAMILY HISTORY: Family History  Problem Relation Age of Onset  . Pancreatic cancer Father   . Colon cancer Maternal Aunt   . Diabetes Maternal Uncle   . Pancreatic cancer Maternal Grandfather     ALLERGIES:  is allergic to lisinopril.  MEDICATIONS:  Current Facility-Administered Medications    Medication Dose Route Frequency Provider Last Rate Last Admin  . 0.9 %  sodium chloride infusion  1,000 mL Intravenous Continuous Cardama, Grayce Sessions, MD 125 mL/hr at 07/01/19 0156 1,000 mL at 07/01/19 0156  . acyclovir (ZOVIRAX) 200 MG capsule 400 mg  400 mg Oral BID Amin, Ankit Chirag, MD   400 mg at 07/03/19 0952  . ceFEPIme (MAXIPIME) 2 g in sodium chloride 0.9 % 100 mL IVPB  2 g Intravenous Q8H Ledell Peoples IV, MD 200 mL/hr at 07/02/19 2149 2 g at 07/02/19 2149  . chlorhexidine (PERIDEX) 0.12 % solution 15 mL  15 mL Mouth Rinse BID Amin, Ankit Chirag, MD   15 mL at 07/03/19 0952  . entecavir (BARACLUDE) tablet 0.5 mg  0.5 mg Oral Daily Norins, Heinz Knuckles, MD      . escitalopram (LEXAPRO) tablet 10 mg  10 mg Oral Daily Amin, Ankit Chirag, MD   10 mg at 07/03/19 0955  . gabapentin (NEURONTIN) capsule 1,200 mg  1,200 mg Oral QHS Amin, Ankit Chirag, MD   1,200 mg at 07/02/19 2136  . gabapentin (NEURONTIN) capsule 900 mg  900 mg Oral BID Amin, Ankit Chirag, MD   900 mg at 07/03/19 0953  . hydrochlorothiazide (HYDRODIURIL) tablet 25 mg  25 mg Oral Daily Amin, Ankit Chirag, MD   25 mg at 07/03/19 0954  . LORazepam (ATIVAN) tablet 1 mg  1 mg Oral Q6H PRN Norins, Heinz Knuckles, MD   1 mg at 07/02/19 2143  . losartan (COZAAR) tablet 25 mg  25 mg Oral Daily Amin, Ankit Chirag, MD   25 mg at 07/03/19 0954  . MEDLINE mouth rinse  15 mL Mouth Rinse q12n4p Amin, Ankit Chirag, MD   15 mL at 07/02/19 1600  . multivitamin with minerals tablet 1 tablet  1 tablet Oral Daily Damita Lack, MD   1 tablet at 07/03/19 0954  . ondansetron (ZOFRAN) tablet 4 mg  4 mg Oral Q6H PRN Norins, Heinz Knuckles, MD   4 mg at 07/01/19 1701  . oxyCODONE (Oxy IR/ROXICODONE) immediate release tablet 5 mg  5 mg Oral Q4H PRN Damita Lack, MD   5 mg at 07/02/19 1957  . polyethylene glycol (MIRALAX / GLYCOLAX) packet 17 g  17 g Oral Daily PRN Amin, Ankit Chirag, MD      . potassium chloride SA (KLOR-CON) CR tablet 40 mEq  40 mEq  Oral BID Raiford Noble Lindsey, DO   40 mEq at 07/03/19 0956  . senna-docusate (Senokot-S) tablet 2 tablet  2 tablet Oral QHS Amin, Jeanella Flattery, MD   2 tablet at 07/01/19 2220  . traMADol (ULTRAM) tablet 50 mg  50 mg Oral Q8H PRN Neena Rhymes, MD   50 mg at 07/02/19 2143  . traZODone (DESYREL) tablet 50 mg  50 mg Oral QHS PRN Damita Lack, MD   50 mg at 07/02/19 2143    REVIEW OF  SYSTEMS:   Constitutional: ( - ) fevers, ( - )  chills , ( - ) night sweats Eyes: ( - ) blurriness of vision, ( - ) double vision, ( - ) watery eyes Ears, nose, mouth, throat, and face: ( - ) mucositis, ( - ) sore throat Respiratory: ( - ) cough, ( - ) dyspnea, ( - ) wheezes Cardiovascular: ( - ) palpitation, ( - ) chest discomfort, ( - ) lower extremity swelling Gastrointestinal:  ( - ) nausea, ( - ) heartburn, ( - ) change in bowel habits Skin: ( - ) abnormal skin rashes Lymphatics: ( - ) new lymphadenopathy, ( - ) easy bruising Neurological: ( - ) numbness, ( - ) tingling, ( - ) new weaknesses Behavioral/Psych: ( - ) mood change, ( - ) new changes  All other systems were reviewed with the patient and are negative.  PHYSICAL EXAMINATION:  Vitals:   07/03/19 0702 07/03/19 1352  BP:  120/69  Pulse: 70 82  Resp:  18  Temp:  98.6 F (37 C)  SpO2:  100%   Filed Weights   06/30/19 2308  Weight: 195 lb 11.2 oz (88.8 kg)    GENERAL: well appearing middle aged Caucasian female, alert, no distress and comfortable SKIN: skin color, texture, turgor are normal, no rashes or significant lesions. Patient is bald.  EYES: conjunctiva are pink and non-injected, sclera clear LUNGS: clear to auscultation and percussion with normal breathing effort HEART: regular rate & rhythm and no murmurs and no lower extremity edema Musculoskeletal: no cyanosis of digits and no clubbing  PSYCH: alert & oriented x 3, fluent speech NEURO: no focal motor/sensory deficits  LABORATORY DATA:  I have reviewed the data as  listed CBC Latest Ref Rng & Units 07/03/2019 07/02/2019 07/01/2019  WBC 4.0 - 10.5 K/uL 6.2 2.1(L) -  Hemoglobin 12.0 - 15.0 g/dL 9.8(L) 9.6(L) 8.8(L)  Hematocrit 36 - 46 % 29.1(L) 28.1(L) 26.0(L)  Platelets 150 - 400 K/uL 46(L) 27(LL) -    CMP Latest Ref Rng & Units 07/03/2019 07/02/2019 07/01/2019  Glucose 70 - 99 mg/dL 93 92 122(H)  BUN 6 - 20 mg/dL 6 6 4(L)  Creatinine 0.44 - 1.00 mg/dL 0.52 0.44 0.40(L)  Sodium 135 - 145 mmol/L 137 138 136  Potassium 3.5 - 5.1 mmol/L 3.3(L) 3.7 3.4(L)  Chloride 98 - 111 mmol/L 103 105 97(L)  CO2 22 - 32 mmol/L 26 24 -  Calcium 8.9 - 10.3 mg/dL 9.0 8.9 -  Total Protein 6.5 - 8.1 g/dL - - -  Total Bilirubin 0.3 - 1.2 mg/dL - - -  Alkaline Phos 38 - 126 U/L - - -  AST 15 - 41 U/L - - -  ALT 0 - 44 U/L - - -    RADIOGRAPHIC STUDIES: CT ANGIO HEAD W OR WO CONTRAST  Result Date: 07/02/2019 CLINICAL DATA:  Headache, acute, normal neuro exam. Additional provided: Headache radiates down both arms and into neck with numbness/weakness in both legs. EXAM: CT ANGIOGRAPHY HEAD AND NECK TECHNIQUE: Multidetector CT imaging of the head and neck was performed using the standard protocol during bolus administration of intravenous contrast. Multiplanar CT image reconstructions and MIPs were obtained to evaluate the vascular anatomy. Carotid stenosis measurements (when applicable) are obtained utilizing NASCET criteria, using the distal internal carotid diameter as the denominator. CONTRAST:  181m OMNIPAQUE IOHEXOL 350 MG/ML SOLN COMPARISON:  Head CT performed 1 day prior 07/01/2019 FINDINGS: CT HEAD FINDINGS Brain: Stable, mild generalized parenchymal atrophy.  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 There is no acute intracranial hemorrhage. No demarcated cortical infarct. No extra-axial fluid collection. No evidence of intracranial mass. No midline shift. Vascular: Reported below. Skull: Normal.  Negative for fracture or focal lesion. Sinuses: Tiny left maxillary sinus mucous retention cyst. No significant mastoid effusion Orbits: No acute abnormality. Review of the MIP images confirms the above findings CTA NECK FINDINGS Aortic arch: Standard aortic branching. Mild calcified plaque within the visualized aortic arch. Streak artifact from a dense right-sided contrast bolus limits evaluation of the mid to distal right subclavian artery. No hemodynamically significant innominate or proximal subclavian artery stenosis. Right carotid system: CCA and ICA patent within the neck without significant stenosis (50% or greater). Mild mixed plaque within the carotid bifurcation and proximal ICA. Left carotid system: CCA and ICA patent within the neck without significant stenosis (50% or greater). Minimal mixed plaque within the proximal ICA. Vertebral arteries: The vertebral arteries are patent within the neck bilaterally without significant stenosis. The left vertebral artery is slightly dominant. Skeleton: No acute bony abnormality or aggressive osseous lesion. Other neck: No neck mass or cervical lymphadenopathy. Thyroid unremarkable. Upper chest: 8 mm ground-glass nodule within the left upper lobe (series 13, image 331). Review of the MIP images confirms the above findings CTA HEAD FINDINGS Anterior circulation: The intracranial internal carotid arteries are patent without significant stenosis. The M1 middle cerebral arteries are patent without significant stenosis. No M2 proximal branch occlusion or high-grade proximal stenosis is identified. The anterior cerebral arteries are patent without significant proximal stenosis. There is a 1-2 mm vascular protrusion arising from the supraclinoid right ICA which may reflect a tiny aneurysm or infundibulum (series 15, image 92). A 1-2 mm vascular protrusion arising from the supraclinoid left ICA appears to reflect a small infundibulum (series 15, image 113). Posterior  circulation: The intracranial vertebral arteries are patent without significant stenosis, as is the basilar artery. The posterior cerebral arteries are patent bilaterally without significant proximal stenosis posterior communicating arteries are hypoplastic or absent bilaterally. Venous sinuses: Within limitations of contrast timing, no convincing thrombus. Anatomic variants: As described Review of the MIP images confirms the above findings IMPRESSION: CT head: 1. No CT evidence of acute intracranial abnormality. 2. Stable mild generalized parenchymal atrophy. 3. 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 4. Tiny left maxillary sinus mucous retention cyst. CTA neck: 1. The common carotid, internal carotid and vertebral arteries are patent within the neck without hemodynamically significant stenosis. Mild mixed plaque within the right carotid bifurcation, proximal right ICA and proximal left ICA. There is also minimal calcified plaque within the visualized aortic arch. 2. 8 mm ground-glass nodule within the imaged left lung apex. Initial follow-up with CT at 6-12 months is recommended to confirm persistence. If persistent, repeat CT is recommended every 2 years until 5 years of stability has been established. This recommendation follows the consensus statement: Guidelines for Management of Incidental Pulmonary Nodules Detected on CT Images: From the Fleischner Society 2017; Radiology 2017; 284:228-243. CTA head: 1. No intracranial large vessel occlusion or proximal high-grade arterial stenosis. 2. 1-2 mm infundibulum versus tiny aneurysm arising from the supraclinoid right ICA. Electronically Signed   By: Kellie Simmering DO   On: 07/02/2019 12:47   CT HEAD WO CONTRAST  Result Date: 07/01/2019 CLINICAL DATA:  Headache with intracranial hemorrhage suspected. Pancytopenia. EXAM: CT HEAD WITHOUT CONTRAST TECHNIQUE: Contiguous axial  images were  obtained from the base of the skull through the vertex without intravenous contrast. COMPARISON:  None. FINDINGS: Brain: No evidence of acute cortical infarction, hemorrhage, hydrocephalus, extra-axial collection or mass lesion/mass effect. 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 Vascular: No hyperdense vessel or unexpected calcification. Skull: Normal. Negative for fracture or focal lesion. Sinuses/Orbits: Negative.  No visualized sinusitis. IMPRESSION: No acute finding.  Negative for intracranial hemorrhage. Electronically Signed   By: Monte Fantasia M.D.   On: 07/01/2019 05:23   CT ANGIO NECK W OR WO CONTRAST  Result Date: 07/02/2019 CLINICAL DATA:  Headache, acute, normal neuro exam. Additional provided: Headache radiates down both arms and into neck with numbness/weakness in both legs. EXAM: CT ANGIOGRAPHY HEAD AND NECK TECHNIQUE: Multidetector CT imaging of the head and neck was performed using the standard protocol during bolus administration of intravenous contrast. Multiplanar CT image reconstructions and MIPs were obtained to evaluate the vascular anatomy. Carotid stenosis measurements (when applicable) are obtained utilizing NASCET criteria, using the distal internal carotid diameter as the denominator. CONTRAST:  159m OMNIPAQUE IOHEXOL 350 MG/ML SOLN COMPARISON:  Head CT performed 1 day prior 07/01/2019 FINDINGS: CT HEAD FINDINGS Brain: Stable, mild generalized parenchymal atrophy. 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 There is no acute intracranial hemorrhage. No demarcated cortical infarct. No extra-axial fluid collection. No evidence of intracranial mass. No midline shift. Vascular: Reported below. Skull: Normal. Negative for fracture or focal lesion. Sinuses: Tiny left maxillary sinus mucous retention cyst. No significant mastoid effusion Orbits: No acute  abnormality. Review of the MIP images confirms the above findings CTA NECK FINDINGS Aortic arch: Standard aortic branching. Mild calcified plaque within the visualized aortic arch. Streak artifact from a dense right-sided contrast bolus limits evaluation of the mid to distal right subclavian artery. No hemodynamically significant innominate or proximal subclavian artery stenosis. Right carotid system: CCA and ICA patent within the neck without significant stenosis (50% or greater). Mild mixed plaque within the carotid bifurcation and proximal ICA. Left carotid system: CCA and ICA patent within the neck without significant stenosis (50% or greater). Minimal mixed plaque within the proximal ICA. Vertebral arteries: The vertebral arteries are patent within the neck bilaterally without significant stenosis. The left vertebral artery is slightly dominant. Skeleton: No acute bony abnormality or aggressive osseous lesion. Other neck: No neck mass or cervical lymphadenopathy. Thyroid unremarkable. Upper chest: 8 mm ground-glass nodule within the left upper lobe (series 13, image 331). Review of the MIP images confirms the above findings CTA HEAD FINDINGS Anterior circulation: The intracranial internal carotid arteries are patent without significant stenosis. The M1 middle cerebral arteries are patent without significant stenosis. No M2 proximal branch occlusion or high-grade proximal stenosis is identified. The anterior cerebral arteries are patent without significant proximal stenosis. There is a 1-2 mm vascular protrusion arising from the supraclinoid right ICA which may reflect a tiny aneurysm or infundibulum (series 15, image 92). A 1-2 mm vascular protrusion arising from the supraclinoid left ICA appears to reflect a small infundibulum (series 15, image 113). Posterior circulation: The intracranial vertebral arteries are patent without significant stenosis, as is the basilar artery. The posterior cerebral arteries are  patent bilaterally without significant proximal stenosis posterior communicating arteries are hypoplastic or absent bilaterally. Venous sinuses: Within limitations of contrast timing, no convincing thrombus. Anatomic variants: As described Review of the MIP images confirms the above findings IMPRESSION: CT head: 1.  No CT evidence of acute intracranial abnormality. 2. Stable mild generalized parenchymal atrophy. 3. 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 4. Tiny left maxillary sinus mucous retention cyst. CTA neck: 1. The common carotid, internal carotid and vertebral arteries are patent within the neck without hemodynamically significant stenosis. Mild mixed plaque within the right carotid bifurcation, proximal right ICA and proximal left ICA. There is also minimal calcified plaque within the visualized aortic arch. 2. 8 mm ground-glass nodule within the imaged left lung apex. Initial follow-up with CT at 6-12 months is recommended to confirm persistence. If persistent, repeat CT is recommended every 2 years until 5 years of stability has been established. This recommendation follows the consensus statement: Guidelines for Management of Incidental Pulmonary Nodules Detected on CT Images: From the Fleischner Society 2017; Radiology 2017; 284:228-243. CTA head: 1. No intracranial large vessel occlusion or proximal high-grade arterial stenosis. 2. 1-2 mm infundibulum versus tiny aneurysm arising from the supraclinoid right ICA. Electronically Signed   By: Kellie Simmering DO   On: 07/02/2019 12:47   DG Chest Port 1 View  Result Date: 06/30/2019 CLINICAL DATA:  Fever. EXAM: PORTABLE CHEST 1 VIEW COMPARISON:  None. FINDINGS: The heart size and mediastinal contours are within normal limits. Both lungs are clear. The visualized skeletal structures are unremarkable. IMPRESSION: No active disease. Electronically Signed   By: Constance Holster M.D.    On: 06/30/2019 23:49    ASSESSMENT & PLAN Kelly Roberson 59 y.o. female with medical history significant for AML s/p induction therapy and 1 cycle of consolidation who presents with concern for neutropenic fever.  Patient presented to the emergency department with a temperature of 100.4 measured at home with no focal infectious symptoms.  She notes that she has not had any issues with subjective fevers, chills, sweats, nausea, vomiting or diarrhea.  Though her temperature was elevated at home on record since arrival here she has not been found to have an elevated temperature.  Per neutropenic fever protocols we will treat the patient with cefepime 2 g IV every 8 hours for management of this neutropenic fever while awaiting for her ANC to rise.  She received a PEG filgrastim shot on 06/24/2019 and only recently began having the bone pain from that medication.    Her ANC today is >1.5 rising from 0.7 yesterday. She has met her goal for discharge. We can continue to monitor in the outpatient setting.  #Neutropenic Fever, resolved --discontinue cefepime 2g IV q8H, ANC is currently back greater than 1.5 --f/u culture data to assure no focal infection to treat --continue acyclovir 470m BID --OK to d/c from Oncological perspective  #Thrombocytopenia --s/p 1 unit of Plt in the ED on admission.  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 EElmo Puttat [facility]--plan for next cycle to start 07/15/2019  All questions were answered. The patient knows to call the clinic with any problems, questions or concerns.  A total of more than 25 minutes were spent on this encounter and over half of that time was spent on counseling and coordination of care as outlined above.   JLedell Peoples MD Department  of Hematology/Oncology [facility]oaglandat [facility] CenterPhone: [phone]: [phone]: [email or web address]  07/03/2019 2:09 PM

## 2019-07-04 ENCOUNTER — Inpatient Hospital Stay: Payer: Medicaid Other

## 2019-07-04 ENCOUNTER — Telehealth: Payer: Self-pay

## 2019-07-04 ENCOUNTER — Other Ambulatory Visit: Payer: Medicaid Other

## 2019-07-04 ENCOUNTER — Telehealth: Payer: Self-pay | Admitting: Internal Medicine

## 2019-07-04 NOTE — Telephone Encounter (Signed)
Transition Care Management Follow-up Telephone Call  Date of discharge and from where:  07/03/2019, [facility]   How have you been since you were released from the hospital? She said she is quite weak and has been through a lot.   Any questions or concerns? concerned about her lab results - dropping WBC and platelets. Scheduled for lab work tomorrow and possibly a transfusion.     Has a pending medicaid application. Currently only approved for family planning medicaid.  She is concerned about her medical costs in the meantime.  Has been approved for disability but it does not take effect until 11/27/2019.    Items Reviewed:  Did the pt receive and understand the discharge instructions provided?  yes  Medications obtained and verified? she said she has all of her medications and did not have any questions at this time. She did state that the gabapentin has helped her neuropathy.   Any new allergies since your discharge?  none reported   Do you have support at home? lives alone  She has a rollator and tub seat.   Functional Questionnaire: (I = Independent and D = Dependent) ADLs: independent.   Follow up appointments reviewed:   PCP Hospital f/u appt confirmed? She said she will call to  schedule an appointment   [facility] f/u appt confirmed? multiple medical appointments scheduled for labs/oncology  Are transportation arrangements needed?  she has transportation scheduled through the cancer clinic.  Informed her that the phone number to call to schedule rides is noted on her AVS.  If their condition worsens, is the pt aware to call PCP or go to the Emergency Dept.?  yes  Was the patient provided with contact information for the PCP's office or ED? she has the phone number for the clinic  Was to pt encouraged to call back with questions or concerns?  yes

## 2019-07-04 NOTE — Telephone Encounter (Signed)
   Todd Argabright DOB: [birth year] MRN: [id number]   RIDER WAIVER AND RELEASE OF LIABILITY  For purposes of improving physical access to our facilities, [facility] is pleased to partner with third parties to provide [facility] patients or other authorized individuals the option of convenient, on-demand ground transportation services (the [place]") through use of the technology service that enables users to request on-demand ground transportation from independent third-party providers.  By opting to use and accept these [employer], I, the undersigned, hereby agree on behalf of myself, and on behalf of any minor child using the [employer] for whom I am the parent or legal guardian, as follows:  1. Government social research officer provided to me are provided by independent third-party transportation providers who are not [employer] or employees and who are unaffiliated with [employer]. 2. [facility] is neither a transportation carrier nor a common or public carrier. 3. [facility] has no control over the quality or safety of the transportation that occurs as a result of the [employer]. 4. [facility] cannot guarantee that any third-party transportation provider will complete any arranged transportation service. 5. [facility] makes no representation, warranty, or guarantee regarding the reliability, timeliness, quality, safety, suitability, or availability of any of the Transport Services or that they will be error free. 6. I fully understand that traveling by vehicle involves risks and dangers of serious bodily injury, including permanent disability, paralysis, and death. I agree, on behalf of myself and on behalf of any minor child using the Transport Services for whom I am the parent or legal guardian, that the entire risk arising out of my use of the [employer] remains solely with me, to the maximum extent permitted under applicable law. 7. The [employer] are provided "as is" and "as available." [facility] disclaims all representations and warranties, express, implied or statutory, not expressly set out in these terms, including the implied warranties of merchantability and fitness for a particular purpose. 8. I hereby waive and release [facility], its agents, employees, officers, directors, representatives, insurers, attorneys, assigns, successors, subsidiaries, and affiliates from any and all past, present, or future claims, demands, liabilities, actions, causes of action, or suits of any kind directly or indirectly arising from acceptance and use of the [employer]. 9. I further waive and release [facility] and its affiliates from all present and future liability and responsibility for any injury or death to persons or damages to property caused by or related to the use of the [employer]. 10. I have read this Waiver and Release of Liability, and I understand the terms used in it and their legal significance. This Waiver is freely and voluntarily given with the understanding that my right (as well as the right of any minor child for whom I am the parent or legal guardian using the [employer]) to legal recourse against  in connection with the [employer] is knowingly surrendered in return for use of these services.   I attest that I read the consent document to Carollee Sires, gave Ms. Bares the opportunity to ask questions and answered the questions asked (if any). I affirm that Azaria Stegman then provided consent for she's participation in this program.     Legrand Pitts

## 2019-07-05 ENCOUNTER — Other Ambulatory Visit: Payer: Self-pay

## 2019-07-05 ENCOUNTER — Telehealth: Payer: Self-pay | Admitting: *Deleted

## 2019-07-05 ENCOUNTER — Inpatient Hospital Stay: Payer: Medicaid Other | Attending: Hematology and Oncology

## 2019-07-05 DIAGNOSIS — Z9221 Personal history of antineoplastic chemotherapy: Secondary | ICD-10-CM | POA: Insufficient documentation

## 2019-07-05 DIAGNOSIS — D696 Thrombocytopenia, unspecified: Secondary | ICD-10-CM

## 2019-07-05 DIAGNOSIS — C9200 Acute myeloblastic leukemia, not having achieved remission: Secondary | ICD-10-CM | POA: Insufficient documentation

## 2019-07-05 DIAGNOSIS — Z5189 Encounter for other specified aftercare: Secondary | ICD-10-CM | POA: Diagnosis not present

## 2019-07-05 DIAGNOSIS — T451X5A Adverse effect of antineoplastic and immunosuppressive drugs, initial encounter: Secondary | ICD-10-CM

## 2019-07-05 DIAGNOSIS — C9501 Acute leukemia of unspecified cell type, in remission: Secondary | ICD-10-CM

## 2019-07-05 LAB — CMP (CANCER CENTER ONLY)
ALT: 17 U/L (ref 0–44)
AST: 25 U/L (ref 15–41)
Albumin: 3 g/dL — ABNORMAL LOW (ref 3.5–5.0)
Alkaline Phosphatase: 135 U/L — ABNORMAL HIGH (ref 38–126)
Anion gap: 11 (ref 5–15)
BUN: 5 mg/dL — ABNORMAL LOW (ref 6–20)
CO2: 26 mmol/L (ref 22–32)
Calcium: 9.2 mg/dL (ref 8.9–10.3)
Chloride: 104 mmol/L (ref 98–111)
Creatinine: 0.62 mg/dL (ref 0.44–1.00)
GFR, Est AFR Am: >60 mL/min (ref >60)
GFR, Estimated: >60 mL/min (ref >60)
Glucose, Bld: 111 mg/dL — ABNORMAL HIGH (ref 70–99)
Potassium: 3.4 mmol/L — ABNORMAL LOW (ref 3.5–5.1)
Sodium: 141 mmol/L (ref 135–145)
Total Bilirubin: 0.6 mg/dL (ref 0.3–1.2)
Total Protein: 6 g/dL — ABNORMAL LOW (ref 6.5–8.1)

## 2019-07-05 LAB — CBC WITH DIFFERENTIAL (CANCER CENTER ONLY)
Abs Immature Granulocytes: 3.29 10*3/uL — ABNORMAL HIGH (ref 0.00–0.07)
Basophils Absolute: 0 10*3/uL (ref 0.0–0.1)
Basophils Relative: 0 %
Eosinophils Absolute: 0 10*3/uL (ref 0.0–0.5)
Eosinophils Relative: 0 %
HCT: 32.2 % — ABNORMAL LOW (ref 36.0–46.0)
Hemoglobin: 10.7 g/dL — ABNORMAL LOW (ref 12.0–15.0)
Immature Granulocytes: 17 %
Lymphocytes Relative: 7 %
Lymphs Abs: 1.4 10*3/uL (ref 0.7–4.0)
MCH: 30.7 pg (ref 26.0–34.0)
MCHC: 33.2 g/dL (ref 30.0–36.0)
MCV: 92.3 fL (ref 80.0–100.0)
Monocytes Absolute: 6.3 10*3/uL — ABNORMAL HIGH (ref 0.1–1.0)
Monocytes Relative: 32 %
Neutro Abs: 8.9 10*3/uL — ABNORMAL HIGH (ref 1.7–7.7)
Neutrophils Relative %: 44 %
Platelet Count: 128 10*3/uL — ABNORMAL LOW (ref 150–400)
RBC: 3.49 MIL/uL — ABNORMAL LOW (ref 3.87–5.11)
RDW: 16.9 % — ABNORMAL HIGH (ref 11.5–15.5)
WBC Count: 19.9 10*3/uL — ABNORMAL HIGH (ref 4.0–10.5)
nRBC: 0 % (ref 0.0–0.2)

## 2019-07-05 LAB — SAMPLE TO BLOOD BANK

## 2019-07-05 NOTE — Telephone Encounter (Signed)
Received call from patient regarding results from today's CBC.  Advised that her blood counts were returning to normal and that she did not needs any transfusions today.  Kelly Roberson voiced understanding.  She is aware of her lab appts for next week.  She also asked about a prescription for Lorazepam she saw on her med list at the time of hospital discharge this week. She states she does not have any and is asking if [name] could send in a prescription for this to the [facility]. I told her I would talk to him about this.

## 2019-07-06 LAB — CULTURE, BLOOD (ROUTINE X 2): Culture: NO GROWTH

## 2019-07-09 ENCOUNTER — Other Ambulatory Visit: Payer: Self-pay

## 2019-07-09 ENCOUNTER — Inpatient Hospital Stay: Payer: Medicaid Other

## 2019-07-09 DIAGNOSIS — C9200 Acute myeloblastic leukemia, not having achieved remission: Secondary | ICD-10-CM | POA: Diagnosis not present

## 2019-07-09 DIAGNOSIS — T451X5A Adverse effect of antineoplastic and immunosuppressive drugs, initial encounter: Secondary | ICD-10-CM

## 2019-07-09 DIAGNOSIS — C9501 Acute leukemia of unspecified cell type, in remission: Secondary | ICD-10-CM

## 2019-07-09 DIAGNOSIS — D696 Thrombocytopenia, unspecified: Secondary | ICD-10-CM

## 2019-07-09 LAB — CMP (CANCER CENTER ONLY)
ALT: 14 U/L (ref 0–44)
AST: 32 U/L (ref 15–41)
Albumin: 3.2 g/dL — ABNORMAL LOW (ref 3.5–5.0)
Alkaline Phosphatase: 118 U/L (ref 38–126)
Anion gap: 11 (ref 5–15)
BUN: <4 mg/dL — ABNORMAL LOW (ref 6–20)
CO2: 27 mmol/L (ref 22–32)
Calcium: 9.4 mg/dL (ref 8.9–10.3)
Chloride: 102 mmol/L (ref 98–111)
Creatinine: 0.73 mg/dL (ref 0.44–1.00)
GFR, Est AFR Am: >60 mL/min (ref >60)
GFR, Estimated: >60 mL/min (ref >60)
Glucose, Bld: 113 mg/dL — ABNORMAL HIGH (ref 70–99)
Potassium: 3.4 mmol/L — ABNORMAL LOW (ref 3.5–5.1)
Sodium: 140 mmol/L (ref 135–145)
Total Bilirubin: 0.7 mg/dL (ref 0.3–1.2)
Total Protein: 6.4 g/dL — ABNORMAL LOW (ref 6.5–8.1)

## 2019-07-09 LAB — CBC WITH DIFFERENTIAL (CANCER CENTER ONLY)
Abs Immature Granulocytes: 2.84 10*3/uL — ABNORMAL HIGH (ref 0.00–0.07)
Basophils Absolute: 0.1 10*3/uL (ref 0.0–0.1)
Basophils Relative: 0 %
Eosinophils Absolute: 0 10*3/uL (ref 0.0–0.5)
Eosinophils Relative: 0 %
HCT: 33.6 % — ABNORMAL LOW (ref 36.0–46.0)
Hemoglobin: 11.1 g/dL — ABNORMAL LOW (ref 12.0–15.0)
Immature Granulocytes: 14 %
Lymphocytes Relative: 9 %
Lymphs Abs: 1.8 10*3/uL (ref 0.7–4.0)
MCH: 31.4 pg (ref 26.0–34.0)
MCHC: 33 g/dL (ref 30.0–36.0)
MCV: 95.2 fL (ref 80.0–100.0)
Monocytes Absolute: 5.9 10*3/uL — ABNORMAL HIGH (ref 0.1–1.0)
Monocytes Relative: 28 %
Neutro Abs: 10.3 10*3/uL — ABNORMAL HIGH (ref 1.7–7.7)
Neutrophils Relative %: 49 %
Platelet Count: 374 10*3/uL (ref 150–400)
RBC: 3.53 MIL/uL — ABNORMAL LOW (ref 3.87–5.11)
RDW: 17.1 % — ABNORMAL HIGH (ref 11.5–15.5)
WBC Count: 21 10*3/uL — ABNORMAL HIGH (ref 4.0–10.5)
nRBC: 1.3 % — ABNORMAL HIGH (ref 0.0–0.2)

## 2019-07-09 LAB — SAMPLE TO BLOOD BANK

## 2019-07-10 ENCOUNTER — Telehealth: Payer: Self-pay | Admitting: *Deleted

## 2019-07-10 ENCOUNTER — Other Ambulatory Visit: Payer: Self-pay | Admitting: Hematology and Oncology

## 2019-07-10 ENCOUNTER — Inpatient Hospital Stay: Payer: Medicaid Other

## 2019-07-10 NOTE — Telephone Encounter (Signed)
TCT patient regarding results of today's labs. No answer but was able to leave message on identified phone for pt to return call @ her convenience. Labs were good and no need for her to come back this week. She is scheduled to go back to [facility] for another cycle of her chemo on 07/15/19

## 2019-07-10 NOTE — Telephone Encounter (Signed)
TCT patient again after she returned call .  Spoke with her and advised that her labs were good and that she did not need to come back for additional labs this week as she will be going to [place] on 07/15/19  Pt voiced understanding.

## 2019-07-11 ENCOUNTER — Inpatient Hospital Stay: Payer: Medicaid Other

## 2019-07-11 ENCOUNTER — Ambulatory Visit: Payer: Medicaid Other | Admitting: Hematology and Oncology

## 2019-07-12 ENCOUNTER — Inpatient Hospital Stay: Payer: Medicaid Other

## 2019-07-12 ENCOUNTER — Inpatient Hospital Stay: Payer: Medicaid Other | Admitting: Hematology and Oncology

## 2019-07-17 ENCOUNTER — Other Ambulatory Visit: Payer: Self-pay | Admitting: *Deleted

## 2019-07-17 ENCOUNTER — Telehealth: Payer: Self-pay | Admitting: *Deleted

## 2019-07-17 DIAGNOSIS — C9501 Acute leukemia of unspecified cell type, in remission: Secondary | ICD-10-CM

## 2019-07-17 NOTE — Telephone Encounter (Signed)
Received call from Nani Skillern, [place] [facility]. Pt is currently in-patient for chemo and is expected to discharge home on Saturday, 07/20/19. She will need Peg-filgrastim on 07/22/19 along with twice weekly labs. Scheduling message sent. [name] made aware.  He will plan on seeing her on 07/25/19 after lab appt.

## 2019-07-18 ENCOUNTER — Telehealth: Payer: Self-pay | Admitting: Hematology and Oncology

## 2019-07-18 NOTE — Telephone Encounter (Signed)
Scheduled appt per 7/14 sch msg - pt is aware of apts for next wk

## 2019-07-19 ENCOUNTER — Other Ambulatory Visit: Payer: Self-pay | Admitting: Hematology and Oncology

## 2019-07-22 ENCOUNTER — Other Ambulatory Visit: Payer: Self-pay

## 2019-07-22 ENCOUNTER — Telehealth: Payer: Self-pay | Admitting: *Deleted

## 2019-07-22 ENCOUNTER — Inpatient Hospital Stay: Payer: Medicaid Other

## 2019-07-22 VITALS — BP 130/81 | HR 68 | Temp 98.8°F | Resp 18

## 2019-07-22 DIAGNOSIS — C9201 Acute myeloblastic leukemia, in remission: Secondary | ICD-10-CM

## 2019-07-22 DIAGNOSIS — C9200 Acute myeloblastic leukemia, not having achieved remission: Secondary | ICD-10-CM | POA: Diagnosis not present

## 2019-07-22 DIAGNOSIS — T451X5A Adverse effect of antineoplastic and immunosuppressive drugs, initial encounter: Secondary | ICD-10-CM

## 2019-07-22 DIAGNOSIS — C9501 Acute leukemia of unspecified cell type, in remission: Secondary | ICD-10-CM

## 2019-07-22 DIAGNOSIS — D701 Agranulocytosis secondary to cancer chemotherapy: Secondary | ICD-10-CM

## 2019-07-22 LAB — CMP (CANCER CENTER ONLY)
ALT: 44 U/L (ref 0–44)
AST: 49 U/L — ABNORMAL HIGH (ref 15–41)
Albumin: 3.1 g/dL — ABNORMAL LOW (ref 3.5–5.0)
Alkaline Phosphatase: 87 U/L (ref 38–126)
Anion gap: 10 (ref 5–15)
BUN: 13 mg/dL (ref 6–20)
CO2: 25 mmol/L (ref 22–32)
Calcium: 9.2 mg/dL (ref 8.9–10.3)
Chloride: 105 mmol/L (ref 98–111)
Creatinine: 0.63 mg/dL (ref 0.44–1.00)
GFR, Est AFR Am: >60 mL/min (ref >60)
GFR, Estimated: >60 mL/min (ref >60)
Glucose, Bld: 102 mg/dL — ABNORMAL HIGH (ref 70–99)
Potassium: 4.3 mmol/L (ref 3.5–5.1)
Sodium: 140 mmol/L (ref 135–145)
Total Bilirubin: 1.2 mg/dL (ref 0.3–1.2)
Total Protein: 6.1 g/dL — ABNORMAL LOW (ref 6.5–8.1)

## 2019-07-22 LAB — CBC WITH DIFFERENTIAL (CANCER CENTER ONLY)
Abs Immature Granulocytes: 0.01 10*3/uL (ref 0.00–0.07)
Basophils Absolute: 0 10*3/uL (ref 0.0–0.1)
Basophils Relative: 0 %
Eosinophils Absolute: 0 10*3/uL (ref 0.0–0.5)
Eosinophils Relative: 1 %
HCT: 27.7 % — ABNORMAL LOW (ref 36.0–46.0)
Hemoglobin: 9 g/dL — ABNORMAL LOW (ref 12.0–15.0)
Immature Granulocytes: 1 %
Lymphocytes Relative: 28 %
Lymphs Abs: 0.4 10*3/uL — ABNORMAL LOW (ref 0.7–4.0)
MCH: 32.1 pg (ref 26.0–34.0)
MCHC: 32.5 g/dL (ref 30.0–36.0)
MCV: 98.9 fL (ref 80.0–100.0)
Monocytes Absolute: 0 10*3/uL — ABNORMAL LOW (ref 0.1–1.0)
Monocytes Relative: 1 %
Neutro Abs: 0.9 10*3/uL — ABNORMAL LOW (ref 1.7–7.7)
Neutrophils Relative %: 69 %
Platelet Count: 49 10*3/uL — ABNORMAL LOW (ref 150–400)
RBC: 2.8 MIL/uL — ABNORMAL LOW (ref 3.87–5.11)
RDW: 18.8 % — ABNORMAL HIGH (ref 11.5–15.5)
WBC Count: 1.3 10*3/uL — ABNORMAL LOW (ref 4.0–10.5)
nRBC: 0 % (ref 0.0–0.2)

## 2019-07-22 LAB — SAMPLE TO BLOOD BANK

## 2019-07-22 MED ORDER — PEGFILGRASTIM-CBQV 6 MG/0.6ML ~~LOC~~ SOSY
6.0000 mg | PREFILLED_SYRINGE | Freq: Once | SUBCUTANEOUS | Status: AC
  Administered 2019-07-22: 6 mg via SUBCUTANEOUS

## 2019-07-22 MED ORDER — PEGFILGRASTIM-CBQV 6 MG/0.6ML ~~LOC~~ SOSY
PREFILLED_SYRINGE | SUBCUTANEOUS | Status: AC
  Filled 2019-07-22: qty 0.6

## 2019-07-22 NOTE — Telephone Encounter (Signed)
TCT patient regarding her CBC results from today.  Spoke with pt and advised that, as expected, her blood counts are down after her chemo at [facility] last week. Advised no need for transfusions. She did receive her Pegfilgrastim today after her labs were done. Reviewed neutropenia precautions and low platelet precautions.  Pt voiced understanding and is aware of repeat labs and MD visit on 07/25/19

## 2019-07-22 NOTE — Patient Instructions (Signed)

## 2019-07-25 ENCOUNTER — Inpatient Hospital Stay: Payer: Medicaid Other | Admitting: Hematology and Oncology

## 2019-07-25 ENCOUNTER — Inpatient Hospital Stay: Payer: Medicaid Other

## 2019-07-25 ENCOUNTER — Telehealth: Payer: Self-pay | Admitting: Hematology and Oncology

## 2019-07-25 ENCOUNTER — Telehealth: Payer: Self-pay | Admitting: *Deleted

## 2019-07-25 NOTE — Telephone Encounter (Signed)
Pt has no arrived for labs and MD appt today. TCT patient. No answer but was able to leave vm message on identified phone for pt to return may call as soon as possible as she needs lab work done today or tomorrow

## 2019-07-25 NOTE — Telephone Encounter (Signed)
Called per 7/22 sch msg - no answer . Left message for patient to call back to reschedule appt.

## 2019-07-26 ENCOUNTER — Telehealth: Payer: Self-pay | Admitting: Hematology and Oncology

## 2019-07-26 NOTE — Telephone Encounter (Signed)
Rescheduled 7/22 appt. Called and left msg.

## 2019-07-29 ENCOUNTER — Inpatient Hospital Stay: Payer: Medicaid Other

## 2019-07-29 ENCOUNTER — Emergency Department (HOSPITAL_COMMUNITY): Payer: Medicaid Other

## 2019-07-29 ENCOUNTER — Encounter (HOSPITAL_COMMUNITY): Payer: Self-pay

## 2019-07-29 ENCOUNTER — Inpatient Hospital Stay (HOSPITAL_COMMUNITY)
Admission: EM | Admit: 2019-07-29 | Discharge: 2019-08-03 | DRG: 871 | Disposition: A | Discharge status: Home / Self Care | Payer: Medicaid Other | Attending: Family Medicine | Admitting: Family Medicine

## 2019-07-29 ENCOUNTER — Ambulatory Visit: Payer: Medicaid Other | Admitting: Hematology and Oncology

## 2019-07-29 ENCOUNTER — Telehealth: Payer: Self-pay | Admitting: *Deleted

## 2019-07-29 DIAGNOSIS — I10 Essential (primary) hypertension: Secondary | ICD-10-CM | POA: Diagnosis present

## 2019-07-29 DIAGNOSIS — E782 Mixed hyperlipidemia: Secondary | ICD-10-CM | POA: Diagnosis present

## 2019-07-29 DIAGNOSIS — Z9884 Bariatric surgery status: Secondary | ICD-10-CM | POA: Diagnosis not present

## 2019-07-29 DIAGNOSIS — F419 Anxiety disorder, unspecified: Secondary | ICD-10-CM | POA: Diagnosis present

## 2019-07-29 DIAGNOSIS — D61810 Antineoplastic chemotherapy induced pancytopenia: Secondary | ICD-10-CM | POA: Diagnosis present

## 2019-07-29 DIAGNOSIS — K529 Noninfective gastroenteritis and colitis, unspecified: Secondary | ICD-10-CM | POA: Diagnosis present

## 2019-07-29 DIAGNOSIS — A09 Infectious gastroenteritis and colitis, unspecified: Secondary | ICD-10-CM | POA: Diagnosis present

## 2019-07-29 DIAGNOSIS — D61818 Other pancytopenia: Secondary | ICD-10-CM

## 2019-07-29 DIAGNOSIS — Z20822 Contact with and (suspected) exposure to covid-19: Secondary | ICD-10-CM | POA: Diagnosis present

## 2019-07-29 DIAGNOSIS — E876 Hypokalemia: Secondary | ICD-10-CM | POA: Diagnosis present

## 2019-07-29 DIAGNOSIS — D6959 Other secondary thrombocytopenia: Secondary | ICD-10-CM | POA: Diagnosis present

## 2019-07-29 DIAGNOSIS — K37 Unspecified appendicitis: Secondary | ICD-10-CM | POA: Diagnosis present

## 2019-07-29 DIAGNOSIS — Z888 Allergy status to other drugs, medicaments and biological substances status: Secondary | ICD-10-CM | POA: Diagnosis not present

## 2019-07-29 DIAGNOSIS — Z79891 Long term (current) use of opiate analgesic: Secondary | ICD-10-CM

## 2019-07-29 DIAGNOSIS — Z87891 Personal history of nicotine dependence: Secondary | ICD-10-CM | POA: Diagnosis not present

## 2019-07-29 DIAGNOSIS — Z9221 Personal history of antineoplastic chemotherapy: Secondary | ICD-10-CM

## 2019-07-29 DIAGNOSIS — C9200 Acute myeloblastic leukemia, not having achieved remission: Secondary | ICD-10-CM | POA: Diagnosis present

## 2019-07-29 DIAGNOSIS — A419 Sepsis, unspecified organism: Principal | ICD-10-CM | POA: Diagnosis present

## 2019-07-29 DIAGNOSIS — T451X5A Adverse effect of antineoplastic and immunosuppressive drugs, initial encounter: Secondary | ICD-10-CM | POA: Diagnosis present

## 2019-07-29 DIAGNOSIS — K922 Gastrointestinal hemorrhage, unspecified: Secondary | ICD-10-CM

## 2019-07-29 DIAGNOSIS — Z9689 Presence of other specified functional implants: Secondary | ICD-10-CM | POA: Diagnosis present

## 2019-07-29 DIAGNOSIS — K589 Irritable bowel syndrome without diarrhea: Secondary | ICD-10-CM | POA: Diagnosis present

## 2019-07-29 DIAGNOSIS — Z79899 Other long term (current) drug therapy: Secondary | ICD-10-CM | POA: Diagnosis not present

## 2019-07-29 DIAGNOSIS — R109 Unspecified abdominal pain: Secondary | ICD-10-CM

## 2019-07-29 LAB — CBC WITH DIFFERENTIAL/PLATELET
Abs Immature Granulocytes: 0.08 10*3/uL — ABNORMAL HIGH (ref 0.00–0.07)
Basophils Absolute: 0 10*3/uL (ref 0.0–0.1)
Basophils Relative: 0 %
Eosinophils Absolute: 0 10*3/uL (ref 0.0–0.5)
Eosinophils Relative: 0 %
HCT: 18.6 % — ABNORMAL LOW (ref 36.0–46.0)
Hemoglobin: 6.1 g/dL — CL (ref 12.0–15.0)
Immature Granulocytes: 11 %
Lymphocytes Relative: 36 %
Lymphs Abs: 0.3 10*3/uL — ABNORMAL LOW (ref 0.7–4.0)
MCH: 32.8 pg (ref 26.0–34.0)
MCHC: 32.8 g/dL (ref 30.0–36.0)
MCV: 100 fL (ref 80.0–100.0)
Monocytes Absolute: 0.1 10*3/uL (ref 0.1–1.0)
Monocytes Relative: 15 %
Neutro Abs: 0.3 10*3/uL — ABNORMAL LOW (ref 1.7–7.7)
Neutrophils Relative %: 38 %
Platelets: <5 10*3/uL — CL (ref 150–400)
RBC: 1.86 MIL/uL — ABNORMAL LOW (ref 3.87–5.11)
RDW: 16.8 % — ABNORMAL HIGH (ref 11.5–15.5)
WBC: 0.8 10*3/uL — CL (ref 4.0–10.5)
nRBC: 0 % (ref 0.0–0.2)

## 2019-07-29 LAB — COMPREHENSIVE METABOLIC PANEL
ALT: 35 U/L (ref 0–44)
AST: 44 U/L — ABNORMAL HIGH (ref 15–41)
Albumin: 2.6 g/dL — ABNORMAL LOW (ref 3.5–5.0)
Alkaline Phosphatase: 61 U/L (ref 38–126)
Anion gap: 10 (ref 5–15)
BUN: 18 mg/dL (ref 6–20)
CO2: 21 mmol/L — ABNORMAL LOW (ref 22–32)
Calcium: 8 mg/dL — ABNORMAL LOW (ref 8.9–10.3)
Chloride: 102 mmol/L (ref 98–111)
Creatinine, Ser: 0.54 mg/dL (ref 0.44–1.00)
GFR calc Af Amer: >60 mL/min (ref >60)
GFR calc non Af Amer: >60 mL/min (ref >60)
Glucose, Bld: 122 mg/dL — ABNORMAL HIGH (ref 70–99)
Potassium: 2.8 mmol/L — ABNORMAL LOW (ref 3.5–5.1)
Sodium: 133 mmol/L — ABNORMAL LOW (ref 135–145)
Total Bilirubin: 1 mg/dL (ref 0.3–1.2)
Total Protein: 5.3 g/dL — ABNORMAL LOW (ref 6.5–8.1)

## 2019-07-29 LAB — PREPARE RBC (CROSSMATCH)

## 2019-07-29 LAB — LIPASE, BLOOD: Lipase: 14 U/L (ref 11–51)

## 2019-07-29 LAB — POC OCCULT BLOOD, ED: Fecal Occult Bld: POSITIVE — AB

## 2019-07-29 LAB — LACTIC ACID, PLASMA: Lactic Acid, Venous: 3.6 mmol/L (ref 0.5–1.9)

## 2019-07-29 MED ORDER — FENTANYL CITRATE (PF) 100 MCG/2ML IJ SOLN
50.0000 ug | Freq: Once | INTRAMUSCULAR | Status: AC
  Administered 2019-07-29: 50 ug via INTRAVENOUS
  Filled 2019-07-29: qty 2

## 2019-07-29 MED ORDER — ACETAMINOPHEN 650 MG RE SUPP: 650.0000 mg | Freq: Four times a day (QID) | RECTAL | Status: DC | PRN

## 2019-07-29 MED ORDER — IOHEXOL 300 MG/ML  SOLN
100.0000 mL | Freq: Once | INTRAMUSCULAR | Status: AC | PRN
  Administered 2019-07-29: 100 mL via INTRAVENOUS

## 2019-07-29 MED ORDER — SODIUM CHLORIDE 0.9 % IV SOLN: INTRAVENOUS | Status: DC

## 2019-07-29 MED ORDER — SODIUM CHLORIDE (PF) 0.9 % IJ SOLN
INTRAMUSCULAR | Status: AC
  Filled 2019-07-29: qty 50

## 2019-07-29 MED ORDER — HYDROMORPHONE HCL 1 MG/ML IJ SOLN
1.0000 mg | Freq: Once | INTRAMUSCULAR | Status: AC
  Administered 2019-07-29: 1 mg via INTRAVENOUS
  Filled 2019-07-29 (×2): qty 1

## 2019-07-29 MED ORDER — SODIUM CHLORIDE 0.9% IV SOLUTION: Freq: Once | INTRAVENOUS | Status: AC

## 2019-07-29 MED ORDER — SODIUM CHLORIDE 0.9 % IV SOLN
10.0000 mL/h | Freq: Once | INTRAVENOUS | Status: AC
  Administered 2019-07-29: 10 mL/h via INTRAVENOUS

## 2019-07-29 MED ORDER — ONDANSETRON HCL 4 MG/2ML IJ SOLN
4.0000 mg | Freq: Four times a day (QID) | INTRAMUSCULAR | Status: DC | PRN
  Administered 2019-07-30 – 2019-08-02 (×7): 4 mg via INTRAVENOUS
  Filled 2019-07-29 (×8): qty 2

## 2019-07-29 MED ORDER — MORPHINE SULFATE (PF) 2 MG/ML IV SOLN
1.0000 mg | INTRAVENOUS | Status: DC | PRN
  Administered 2019-07-30 (×3): 1 mg via INTRAVENOUS
  Filled 2019-07-29 (×3): qty 1

## 2019-07-29 MED ORDER — ONDANSETRON HCL 4 MG/2ML IJ SOLN
4.0000 mg | Freq: Once | INTRAMUSCULAR | Status: AC
  Administered 2019-07-29: 4 mg via INTRAVENOUS
  Filled 2019-07-29: qty 2

## 2019-07-29 MED ORDER — LACTATED RINGERS IV BOLUS
1000.0000 mL | Freq: Once | INTRAVENOUS | Status: AC
  Administered 2019-07-29: 1000 mL via INTRAVENOUS

## 2019-07-29 MED ORDER — PIPERACILLIN-TAZOBACTAM 3.375 G IVPB 30 MIN
3.3750 g | Freq: Once | INTRAVENOUS | Status: AC
  Administered 2019-07-29: 3.375 g via INTRAVENOUS
  Filled 2019-07-29: qty 50

## 2019-07-29 MED ORDER — PIPERACILLIN-TAZOBACTAM 3.375 G IVPB
3.3750 g | Freq: Three times a day (TID) | INTRAVENOUS | Status: DC
  Administered 2019-07-30 – 2019-08-01 (×6): 3.375 g via INTRAVENOUS
  Filled 2019-07-29 (×8): qty 50

## 2019-07-29 MED ORDER — ACETAMINOPHEN 325 MG PO TABS: 650.0000 mg | ORAL_TABLET | Freq: Four times a day (QID) | ORAL | Status: DC | PRN

## 2019-07-29 NOTE — ED Notes (Signed)
Date and time results received: 07/29/19 1614 (use smartphrase ".now" to insert current time)  Test: Lactic Critical Value: 36.5  Name of Provider Notified: Maryan Rued, MD  Orders Received? Or Actions Taken?: Orders Received - See Orders for details

## 2019-07-29 NOTE — ED Provider Notes (Signed)
Signout from Dr. Maryan Rued.  59 year old female history of AML here with 4 days of bloody diarrhea low abdominal pain.  Her anemia is worse and her platelets are significantly down.  She discussed with oncology and they are in agreement for transfusion of both blood and platelets.  She is pending an abdominal CT.  Plan is admission for further management.  Oncology recommended against doing a Covid swab due to her neutropenia and thrombocytopenia. Physical Exam  BP (!) 112/90   Pulse (!) 107   Temp 99.8 F (37.7 C) (Oral)   Resp (!) 25   Ht 5\' 8"  (1.727 m)   Wt 90.7 kg   SpO2 100%   BMI 30.41 kg/m   Physical Exam  ED Course/Procedures     Procedures  MDM  Patient received her CT and shows some element of colitis.  She already has antibiotics ordered and blood is beginning to be hung.  Discussed with [facility] hospitalist Dr. Marlowe Sax who will evaluate the patient for admission.     Hayden Rasmussen, MD 07/30/19 [phone]

## 2019-07-29 NOTE — ED Notes (Signed)
Date and time results received: 07/29/19 1614 (use smartphrase ".now" to insert current time)  Test: WBC Critical Value: 0.8  Name of Provider Notified: Maryan Rued, MD  Orders Received? Or Actions Taken?: Orders Received - See Orders for details

## 2019-07-29 NOTE — H&P (Addendum)
History and Physical    Kelly Roberson GLO:[id number] DOB: August 09, 1960 DOA: 07/29/2019  PCP: Ladell Pier, MD Patient coming from: Home  Chief Complaint: Bloody diarrhea, abdominal pain  HPI: Kelly Roberson is a 59 y.o. female with medical history significant of AML currently on chemotherapy, anxiety, hypertension, IBS, history of gastric bypass surgery in 2013 presenting with complaints of bloody diarrhea and abdominal pain.  Patient reports 3-day history of multiple episodes of nonbloody diarrhea and bilateral lower quadrant abdominal pain.  She has also had nausea and dry heaves.  She is not on any blood thinners.  Reports having fever and chills 2 days ago.  States she has received blood and platelet transfusions previously as she is currently on chemotherapy.  She is being comanaged by oncology at [name] and here in [place].  Does report a few episodes of chest discomfort and dyspnea on exertion recently.  Denies any chest pain or dyspnea at present.  ED Course: Temperature 99.8 F.  Slightly tachycardic on arrival.  Blood pressure low with systolic in the 18A.  Labs showing worsening pancytopenia-WBC count 0.8 (ANC 0.3), hemoglobin 6.1, and platelet count less than 5k.  FOBT positive.  Potassium 2.8.  Lactic acid 3.6.  Lipase normal and no significant elevation of LFTs.  Blood culture x2 pending.  CT abdomen pelvis showing findings suggestive of mild colitis/typhilitis involving the right colon and cecum.  No surrounding loculated fluid collections or free air.  Chest x-ray showing no active disease.  Oncology recommended blood and platelet transfusions.  Recommended against doing a Covid swab due to patient's neutropenia and thrombocytopenia.  2 units PRBCs and 1 unit platelets ordered in the ED.  Patient received fentanyl, Dilaudid, Zofran, Zosyn, and 1 L lactated Ringer's bolus.  Slight tachycardia resolved and blood pressure improved after IV fluid bolus.  Review of Systems:   All systems reviewed and apart from history of presenting illness, are negative.  Past Medical History:  Diagnosis Date  . Anxiety   . Hypertension   . IBS (irritable bowel syndrome)     Past Surgical History:  Procedure Laterality Date  . GASTRIC BYPASS  2013  . NISSEN FUNDOPLICATION  4166   Reversed in 2013  . ORIF ANKLE FRACTURE Left 06/2017  . PLACEMENT OF BREAST IMPLANTS       reports that she has quit smoking. She has never used smokeless tobacco. She reports previous alcohol use. She reports previous drug use.  Allergies  Allergen Reactions  . Lisinopril Cough    Family History  Problem Relation Age of Onset  . Pancreatic cancer Father   . Colon cancer Maternal Aunt   . Diabetes Maternal Uncle   . Pancreatic cancer Maternal Grandfather     Prior to Admission medications   Medication Sig Start Date End Date Taking? Authorizing Provider  acyclovir (ZOVIRAX) 200 MG capsule Take 400 mg by mouth 2 (two) times daily.  05/31/19  Yes [provider]  entecavir (BARACLUDE) 0.5 MG tablet Take 0.5 mg by mouth daily.  06/01/19 05/31/20 Yes [provider]  escitalopram (LEXAPRO) 10 MG tablet Take 10 mg by mouth daily.  06/19/19 09/17/19 Yes [provider]  hydrochlorothiazide (HYDRODIURIL) 25 MG tablet Take 25 mg by mouth daily.  06/01/19 05/31/20 Yes [provider]  LORazepam (ATIVAN) 1 MG tablet Take 1 mg by mouth at bedtime as needed for anxiety or sleep.  06/18/19  Yes [provider]  losartan (COZAAR) 25 MG tablet Take 1 tablet (25 mg  total) by mouth daily. 05/02/19  Yes Shalhoub, Sherryll Burger, MD  Multiple Vitamin (MULTIVITAMIN WITH MINERALS) TABS tablet Take 1 tablet by mouth daily. 07/04/19  Yes Sheikh, Omair Latif, DO  ondansetron (ZOFRAN) 4 MG tablet Take 1 tablet (4 mg total) by mouth every 6 (six) hours as needed for nausea. 07/03/19  Yes Sheikh, Omair Latif, DO  polyethylene glycol (MIRALAX / GLYCOLAX) 17 g packet Take 17 g by mouth  daily as needed for moderate constipation or severe constipation. 07/03/19  Yes Sheikh, Omair Latif, DO  senna-docusate (SENOKOT-S) 8.6-50 MG tablet Take 1 tablet by mouth at bedtime. 07/03/19  Yes Sheikh, Omair Latif, DO  traMADol (ULTRAM) 50 MG tablet Take 1 tablet (50 mg total) by mouth every 8 (eight) hours as needed (mild pain). 07/03/19  Yes Sheikh, Omair Latif, DO  traZODone (DESYREL) 50 MG tablet Take 50 mg by mouth at bedtime as needed for sleep.  05/31/19  Yes [provider]  cyclobenzaprine (FLEXERIL) 5 MG tablet Take 1 tablet (5 mg total) by mouth 3 (three) times daily as needed for muscle spasms. Patient not taking: Reported on 07/29/2019 05/01/19   Vernelle Emerald, MD    Physical Exam: Vitals:   07/29/19 2000 07/29/19 2121 07/29/19 2153 07/29/19 2231  BP: (!) 107/60 (!) 110/62 107/69 (!) 106/62  Pulse: 92 93 94 97  Resp: (!) 26 (!) 24 18 (!) 25  Temp:  99.5 F (37.5 C) 98.2 F (36.8 C)   TempSrc:  Oral Oral   SpO2: 98% 99% 100% 98%  Weight:      Height:        Physical Exam Constitutional:      General: She is not in acute distress. HENT:     Head: Normocephalic and atraumatic.     Mouth/Throat:     Mouth: Mucous membranes are dry.     Pharynx: Oropharynx is clear.  Eyes:     Extraocular Movements: Extraocular movements intact.     Conjunctiva/sclera: Conjunctivae normal.  Cardiovascular:     Rate and Rhythm: Normal rate and regular rhythm.     Pulses: Normal pulses.  Pulmonary:     Effort: Pulmonary effort is normal. No respiratory distress.     Breath sounds: Normal breath sounds. No wheezing or rales.  Abdominal:     General: Bowel sounds are normal. There is no distension.     Palpations: Abdomen is soft.     Tenderness: There is abdominal tenderness. There is no guarding or rebound.     Comments: Bilateral lower quadrants tender to palpation  Musculoskeletal:        General: No swelling or tenderness.     Cervical back: Normal range of motion  and neck supple.  Skin:    General: Skin is warm and dry.  Neurological:     General: No focal deficit present.     Mental Status: She is alert and oriented to person, place, and time.  Psychiatric:        Mood and Affect: Mood normal.        Behavior: Behavior normal.     Labs on Admission: I have personally reviewed following labs and imaging studies  CBC: Recent Labs  Lab 07/29/19 1449  WBC 0.8*  NEUTROABS 0.3*  HGB 6.1*  HCT 18.6*  MCV 100.0  PLT <5*   Basic Metabolic Panel: Recent Labs  Lab 07/29/19 1449  NA 133*  K 2.8*  CL 102  CO2 21*  GLUCOSE 122*  BUN 18  CREATININE 0.54  CALCIUM 8.0*   GFR: Estimated Creatinine Clearance: 88.1 mL/min (by C-G formula based on SCr of 0.54 mg/dL). Liver Function Tests: Recent Labs  Lab 07/29/19 1449  AST 44*  ALT 35  ALKPHOS 61  BILITOT 1.0  PROT 5.3*  ALBUMIN 2.6*   Recent Labs  Lab 07/29/19 1449  LIPASE 14   No results for input(s): AMMONIA in the last 168 hours. Coagulation Profile: No results for input(s): INR, PROTIME in the last 168 hours. Cardiac Enzymes: No results for input(s): CKTOTAL, CKMB, CKMBINDEX, TROPONINI in the last 168 hours. BNP (last 3 results) No results for input(s): PROBNP in the last 8760 hours. HbA1C: No results for input(s): HGBA1C in the last 72 hours. CBG: No results for input(s): GLUCAP in the last 168 hours. Lipid Profile: No results for input(s): CHOL, HDL, LDLCALC, TRIG, CHOLHDL, LDLDIRECT in the last 72 hours. Thyroid Function Tests: No results for input(s): TSH, T4TOTAL, FREET4, T3FREE, THYROIDAB in the last 72 hours. Anemia Panel: No results for input(s): VITAMINB12, FOLATE, FERRITIN, TIBC, IRON, RETICCTPCT in the last 72 hours. Urine analysis:    Component Value Date/Time   COLORURINE YELLOW 07/01/2019 0130   APPEARANCEUR CLEAR 07/01/2019 0130   LABSPEC 1.006 07/01/2019 0130   PHURINE 7.0 07/01/2019 0130   GLUCOSEU NEGATIVE 07/01/2019 0130   HGBUR NEGATIVE  07/01/2019 0130   BILIRUBINUR NEGATIVE 07/01/2019 0130   KETONESUR NEGATIVE 07/01/2019 0130   PROTEINUR NEGATIVE 07/01/2019 0130   NITRITE NEGATIVE 07/01/2019 0130   LEUKOCYTESUR NEGATIVE 07/01/2019 0130    Radiological Exams on Admission: CT ABDOMEN PELVIS W CONTRAST  Result Date: 07/29/2019 CLINICAL DATA:  History of leukemia, currently on chemotherapy, bloody diarrhea EXAM: CT ABDOMEN AND PELVIS WITH CONTRAST TECHNIQUE: Multidetector CT imaging of the abdomen and pelvis was performed using the standard protocol following bolus administration of intravenous contrast. CONTRAST:  130mL OMNIPAQUE IOHEXOL 300 MG/ML  SOLN COMPARISON:  None. FINDINGS: Lower chest: The visualized heart size within normal limits. No pericardial fluid/thickening. No hiatal hernia. Streaky atelectasis or scarring seen at the right lung base. Hepatobiliary: There is a 1.7 cm low-density lesion seen in the posterior right liver lobe which partially fills in on delayed images, likely hemangioma. Main portal vein is patent. No evidence of calcified gallstones, gallbladder wall thickening or biliary dilatation. Pancreas: Unremarkable. No pancreatic ductal dilatation or surrounding inflammatory changes. Spleen: Normal in size without focal abnormality. Adrenals/Urinary Tract: Both adrenal glands appear normal. The kidneys and collecting system appear normal without evidence of urinary tract calculus or hydronephrosis. Bladder is unremarkable. Stomach/Bowel: The patient is status post Roux-en-Y gastric bypass. The stomach and small bowel otherwise unremarkable. There appears to be mild wall thickening seen around the right colon/cecum with mild fat stranding changes. No loculated fluid collections however are noted. The appendix is unremarkable. The remainder of the colon is unremarkable. Vascular/Lymphatic: There are no enlarged mesenteric, retroperitoneal, or pelvic lymph nodes. Scattered aortic atherosclerotic calcifications are seen  without aneurysmal dilatation. Reproductive: The uterus and adnexa are unremarkable. Other: No evidence of abdominal wall mass or hernia. Musculoskeletal: No acute or significant osseous findings. IMPRESSION: Findings which could be suggestive of mild colitis/typhlitis involving the right colon and cecum. No surrounding loculated fluid collections or free air. Aortic Atherosclerosis (ICD10-I70.0). Electronically Signed   By: Prudencio Pair M.D.   On: 07/29/2019 21:28   DG Chest Port 1 View  Result Date: 07/29/2019 CLINICAL DATA:  Weakness EXAM: PORTABLE CHEST 1 VIEW COMPARISON:  06/30/2019 FINDINGS: The  heart size and mediastinal contours are within normal limits. Both lungs are clear. The visualized skeletal structures are unremarkable. IMPRESSION: No active disease. Electronically Signed   By: Donavan Foil M.D.   On: 07/29/2019 16:23    EKG: Independently reviewed.  Sinus rhythm, no significant change since prior tracing.  Assessment/Plan Principal Problem:   Colitis Active Problems:   AML (acute myeloblastic leukemia) ([place])   Sepsis ([place])   Hypokalemia   Pancytopenia ([place])   Sepsis secondary to colitis: Patient is presenting with complaints of abdominal pain and bloody diarrhea.  She had a low-grade temperature.  She was slightly tachycardic on arrival and blood pressure soft.  Tachycardia has resolved and blood pressure improved after IV fluid bolus.  Labs showing worsening neutropenia and anemia.  FOBT positive.  Lactic acid 3.6. CT abdomen pelvis showing findings suggestive of mild colitis/typhilitis involving the right colon and cecum.  No surrounding loculated fluid collections or free air. -Continue Zosyn and IV fluid hydration.  Morphine as needed for pain.  Zofran as needed for nausea.  Trend lactic acid.  Blood culture x2 pending.  Since patient is currently hemodynamically stable, I have requested a nonurgent GI consult in the morning by messaging Dr. Rush Landmark via secure  chat. Addendum: C. difficile PCR and GI pathogen panel have also been ordered and currently pending.  Symptomatic anemia/ pancytopenia: Likely related to chemotherapy.  Now worsened on labs - WBC count 0.8 (ANC 0.3), hemoglobin 6.1, and platelet count less than 5k.  Oncology recommended blood and platelet transfusions. -Type and screen.  2 units PRBCs and 1 unit platelets ordered in the ED.  Order posttransfusion CBC and continue to monitor closely.  Oncology will consult in a.m.  Chest pain, dyspnea on exertion: Likely due to symptomatic anemia.  Currently asymptomatic.  EKG without acute ischemic changes. -Cardiac monitoring.  Check high-sensitivity troponin level.  Continue blood transfusions as mentioned above.  Hypokalemia: Likely due to GI loss from diarrhea and home diuretic use.  Potassium 2.8.  EKG without acute changes. -Cardiac monitoring.  Replete potassium.  Check magnesium level and replete if low.  Continue to monitor electrolytes.  Hold diuretic at this time.  AML: Followed by oncology and currently on chemotherapy.   -[name] is aware and oncology will continue to follow with this patient while she is hospitalized.  Hypertension -Hold antihypertensives at this time given concern for sepsis/soft blood pressure.  DVT prophylaxis: SCDs Code Status: Full code-discussed with the patient Family Communication: No family available at this time. Disposition Plan: Status is: Inpatient  Remains inpatient appropriate because:Persistent severe electrolyte disturbances, Ongoing active pain requiring inpatient pain management, IV treatments appropriate due to intensity of illness or inability to take PO and Inpatient level of care appropriate due to severity of illness   Dispo: The patient is from: Home              Anticipated d/c is to: Home              Anticipated d/c date is: > 3 days              Patient currently is not medically stable to d/c.  The medical decision making  on this patient was of high complexity and the patient is at high risk for clinical deterioration, therefore this is a level 3 visit.  Shela Leff MD [facility]  If 7PM-7AM, please contact night-coverage [email or web address]  07/29/2019, 11:31 PM

## 2019-07-29 NOTE — Progress Notes (Signed)
CSW spoke to the pt about transportation but she states she is being admitted.  CSW confirmed with with pt's EDP.  Please reconsult if future social work needs arise.  CSW signing off, as social work intervention is no longer needed.  Alphonse Guild. Ndidi Nesby  MSW, LCSW, LCAS, CCS Transitions of Care Clinical Social Worker Care Coordination Department Ph: [phone]

## 2019-07-29 NOTE — ED Notes (Signed)
Date and time results received: 07/29/19 1614 (use smartphrase ".now" to insert current time)  Test: Platlets Critical Value: <5  Name of Provider Notified: Maryan Rued, MD  Orders Received? Or Actions Taken?: Orders Received - See Orders for details

## 2019-07-29 NOTE — Progress Notes (Addendum)
Brief Hematology Consult Note  Kelly Roberson is a 59 year old female with medical history significant for acute myeloid leukemia currently under the care of Dr. Jeannie Done at [facility].  She has completed consolidation chemotherapy cycle 2 which was administered the week of 7/12-7/16.  She follows locally for transfusion support.  Labs last week were low but stable not requiring transfusion.  Unfortunately she missed her clinic visit on Thursday, 07/25/2019 due to attending a funeral.  Patient also missed her visit today and when called she note that she has been having bloody diarrhea since Friday morning.  She is also feeling too weak to get into the car.  As such we recommended ambulance transportation to the emergency department for further evaluation and management.  On arrival to the emergency department the patient was found to be normotensive, however she was tachycardic with a heart rate of approximately 107.  CBC showed a white blood cell count of 0.8, hemoglobin 6.1, and platelets less than 5.  On exam today she notes that she is having abdominal pain, but denies having headache, vision changes, or fevers, chills, sweats.  She notes that she has continued to have diarrhea which is almost entirely blood.  She notes at least 12 episodes of this and several episodes today.  She showed [place] a picture of blood across her hardwood floor from the kitchen to the bathroom when she was incontinent with the bloody bowel movement.  Recommendations: --please obtain a CT A/P to assess for infectious colitis or other over source of bleeding --recommend transfusion goal of Hgb >8.0 and Plt >10 --no clear indication for empiric antibiotics, however given low grade temperature at home and abdominal discomfort it is reasonable to cover with zosyn until imaging returns.  --recommend GI consult in the AM, though with her neutropenia and thrombocytopenia endoscopic intervention would not be feasible.   --Hematology will continue to follow with this patient in house. Please do not hesitate to call or page with any questions or concerns. Full consult note to follow.   Ledell Peoples, MD Department of Hematology/Oncology [place] at [facility] Phone: [phone] Pager: [phone] Email: [email or web address]

## 2019-07-29 NOTE — ED Notes (Signed)
Date and time results received: 07/29/19 1614 (use smartphrase ".now" to insert current time)  Test: Hbg Critical Value: 6.1  Name of Provider Notified: Maryan Rued, MD  Orders Received? Or Actions Taken?: Orders Received - See Orders for details

## 2019-07-29 NOTE — ED Triage Notes (Signed)
PT BIB [facility] from home. Pt states since Friday she has experienced bright red stool with clots included. Per EMS they saw dark red stools and blood clots too. Pt does c/o of lower abd pain. Pt was diagnosed with Leukemia, last chemo treatment was on July 12th at [facility]. Pt states she has progressively gotten weaker since her Chemo treatment. Pt is A&Ox4.

## 2019-07-29 NOTE — ED Provider Notes (Signed)
[facility] DEPT Provider Note   CSN: [id number] Arrival date & time: 07/29/19  1353     History Chief Complaint  Patient presents with  . Rectal Bleeding  . Abdominal Pain    Kelly Roberson is a 59 y.o. female.  Patient is a 59 year old female with a history of leukemia currently on chemotherapy who has required multiple blood and platelet transfusions who is presenting today with a 4-day history of persistent bloody diarrhea.  Patient states symptoms started on Friday evening.  She reports that it is bright red blood with intermittent clot.  She is having approximately 10-15 stools within a 24-hour time period.  She has also had nausea and dry heaving but no vomiting.  Due to the nausea she is taking less of her medications because she is unable to tolerate them.  She has felt dizzy with standing but denies any cough or shortness of breath.  She has also developed lower abdominal pain that is been persistent and about a 5 out of 10.  Nothing seems to make it worse.  She denies any urinary symptoms.  She has not fallen or hit her head.  She has not had any syncopal events.  She does not take anticoagulation.  Her highest temperature since Friday has been 100.3.  A friend came and saw her today and felt that she looked very poorly and insisted that she come to the emergency room.  She reports they have been backing off on some of her chemo treatments but she last received a treatment on July 12.  She receives her AML care at [facility].  The history is provided by the patient.  Rectal Bleeding Associated symptoms: abdominal pain   Abdominal Pain Associated symptoms: hematochezia        Past Medical History:  Diagnosis Date  . Anxiety   . Hypertension   . IBS (irritable bowel syndrome)     Patient Active Problem List   Diagnosis Date Noted  . Antineoplastic chemotherapy induced pancytopenia ([place]) 07/01/2019  . Pancytopenia due to antineoplastic  chemotherapy ([place]) 07/01/2019  . Headache 07/01/2019  . Neutropenia ([place]) 07/01/2019  . Macrocytic anemia 05/01/2019  . AML (acute myeloblastic leukemia) ([place]) 05/01/2019  . Neck pain on right side 05/01/2019  . Leukocytosis 05/01/2019  . Bicytopenia 04/30/2019  . Macrocytosis without anemia 02/12/2019  . Family history of hemochromatosis 02/12/2019  . Mixed hyperlipidemia 01/18/2019  . Abnormal LFTs 01/18/2019  . Dandruff in adult 01/18/2019  . Hair loss 01/18/2019  . Muscle weakness 01/18/2019  . Peripheral neuropathy 01/18/2019  . Other fatigue 01/18/2019  . Essential hypertension 06/01/2018  . Palpitations 06/01/2018  . Anxiety disorder 06/01/2018  . Insomnia 06/01/2018  . Irritable bowel syndrome with diarrhea 06/01/2018  . Barrett's esophagus with dysplasia 06/01/2018    Past Surgical History:  Procedure Laterality Date  . GASTRIC BYPASS  2013  . NISSEN FUNDOPLICATION  7939   Reversed in 2013  . ORIF ANKLE FRACTURE Left 06/2017  . PLACEMENT OF BREAST IMPLANTS       OB History   No obstetric history on file.     Family History  Problem Relation Age of Onset  . Pancreatic cancer Father   . Colon cancer Maternal Aunt   . Diabetes Maternal Uncle   . Pancreatic cancer Maternal Grandfather     Social History   Tobacco Use  . Smoking status: Former Research scientist (life sciences)  . Smokeless tobacco: Never Used  Substance Use Topics  . Alcohol use:  Not Currently    Comment: occasionally   . Drug use: Not Currently    Home Medications Prior to Admission medications   Medication Sig Start Date End Date Taking? Authorizing Provider  acyclovir (ZOVIRAX) 200 MG capsule Take 400 mg by mouth 2 (two) times daily.  05/31/19   [provider]  cyclobenzaprine (FLEXERIL) 5 MG tablet Take 1 tablet (5 mg total) by mouth 3 (three) times daily as needed for muscle spasms. 05/01/19   Shalhoub, Sherryll Burger, MD  entecavir (BARACLUDE) 0.5 MG tablet Take 0.5 mg by mouth daily.  06/01/19 05/31/20   [provider]  escitalopram (LEXAPRO) 10 MG tablet Take 10 mg by mouth daily.  06/19/19 09/17/19  [provider]  hydrochlorothiazide (HYDRODIURIL) 25 MG tablet Take 25 mg by mouth daily.  06/01/19 05/31/20  [provider]  LORazepam (ATIVAN) 1 MG tablet Take 1 mg by mouth at bedtime as needed for anxiety or sleep.  06/18/19   [provider]  losartan (COZAAR) 25 MG tablet Take 1 tablet (25 mg total) by mouth daily. 05/02/19   Shalhoub, Sherryll Burger, MD  Multiple Vitamin (MULTIVITAMIN WITH MINERALS) TABS tablet Take 1 tablet by mouth daily. 07/04/19   Sheikh, Omair Latif, DO  ondansetron (ZOFRAN) 4 MG tablet Take 1 tablet (4 mg total) by mouth every 6 (six) hours as needed for nausea. 07/03/19   Sheikh, Georgina Quint Latif, DO  polyethylene glycol (MIRALAX / GLYCOLAX) 17 g packet Take 17 g by mouth daily as needed for moderate constipation or severe constipation. 07/03/19   Sheikh, Omair Latif, DO  senna-docusate (SENOKOT-S) 8.6-50 MG tablet Take 1 tablet by mouth at bedtime. 07/03/19   Sheikh, Georgina Quint Latif, DO  traMADol (ULTRAM) 50 MG tablet Take 1 tablet (50 mg total) by mouth every 8 (eight) hours as needed (mild pain). 07/03/19   Raiford Noble Latif, DO  traZODone (DESYREL) 50 MG tablet Take 50 mg by mouth at bedtime as needed for sleep.  05/31/19   [provider]    Allergies    Lisinopril  Review of Systems   Review of Systems  Gastrointestinal: Positive for abdominal pain and hematochezia.  All other systems reviewed and are negative.   Physical Exam Updated Vital Signs BP (!) 107/59   Pulse (!) 106   Temp 99.8 F (37.7 C) (Oral)   Resp 18   Ht 5\' 8"  (1.727 m)   Wt 90.7 kg   SpO2 100%   BMI 30.41 kg/m   Physical Exam Vitals and nursing note reviewed.  Constitutional:      General: She is not in acute distress.    Appearance: She is well-developed. She is obese.  HENT:     Head: Normocephalic and atraumatic.     Nose: Nose normal.      Mouth/Throat:     Mouth: Mucous membranes are dry.  Eyes:     Pupils: Pupils are equal, round, and reactive to light.     Comments: Pale conjunctiva  Cardiovascular:     Rate and Rhythm: Normal rate and regular rhythm.     Heart sounds: No murmur heard.   Pulmonary:     Effort: Pulmonary effort is normal. No respiratory distress.     Breath sounds: Normal breath sounds. No wheezing or rales.  Abdominal:     General: There is no distension.     Palpations: Abdomen is soft.     Tenderness: There is abdominal tenderness in the right lower quadrant, suprapubic area and  left lower quadrant. There is no guarding or rebound.  Genitourinary:    Rectum: Guaiac result positive. No anal fissure or external hemorrhoid.     Comments: Blood present on exam finger.  Hemoccult positive.  No external hemorrhoids or fissures appreciated Musculoskeletal:        General: No tenderness. Normal range of motion.     Cervical back: Normal range of motion and neck supple.  Skin:    General: Skin is warm and dry.     Capillary Refill: Capillary refill takes 2 to 3 seconds.     Findings: No erythema or rash.     Comments: Poor skin turgor  Neurological:     General: No focal deficit present.     Mental Status: She is alert and oriented to person, place, and time. Mental status is at baseline.  Psychiatric:        Mood and Affect: Mood normal.        Behavior: Behavior normal.        Thought Content: Thought content normal.     ED Results / Procedures / Treatments   Labs (all labs ordered are listed, but only abnormal results are displayed) Labs Reviewed  CBC WITH DIFFERENTIAL/PLATELET - Abnormal; Notable for the following components:      Result Value   WBC 0.8 (*)    RBC 1.86 (*)    Hemoglobin 6.1 (*)    HCT 18.6 (*)    RDW 16.8 (*)    Platelets <5 (*)    Neutro Abs 0.3 (*)    Lymphs Abs 0.3 (*)    Abs Immature Granulocytes 0.08 (*)    All other components within normal limits    COMPREHENSIVE METABOLIC PANEL - Abnormal; Notable for the following components:   Sodium 133 (*)    Potassium 2.8 (*)    CO2 21 (*)    Glucose, Bld 122 (*)    Calcium 8.0 (*)    Total Protein 5.3 (*)    Albumin 2.6 (*)    AST 44 (*)    All other components within normal limits  LACTIC ACID, PLASMA - Abnormal; Notable for the following components:   Lactic Acid, Venous 3.6 (*)    All other components within normal limits  POC OCCULT BLOOD, ED - Abnormal; Notable for the following components:   Fecal Occult Bld POSITIVE (*)    All other components within normal limits  CULTURE, BLOOD (ROUTINE X 2)  CULTURE, BLOOD (ROUTINE X 2)  LIPASE, BLOOD  TYPE AND SCREEN  PREPARE RBC (CROSSMATCH)    EKG None  Radiology No results found.  Procedures Procedures (including critical care time)  Medications Ordered in ED Medications  ondansetron (ZOFRAN) injection 4 mg (has no administration in time range)  fentaNYL (SUBLIMAZE) injection 50 mcg (has no administration in time range)  lactated ringers bolus 1,000 mL (has no administration in time range)    ED Course  I have reviewed the triage vital signs and the nursing notes.  Pertinent labs & imaging results that were available during my care of the patient were reviewed by me and considered in my medical decision making (see chart for details).    MDM Rules/Calculators/A&P                          59 year old female with multiple medical problems presenting today with bloody stools.  This is now been present for 4 days without prior history of  similar.  Patient states years ago she had a colonoscopy that showed polyps but no other acute issues.  She is currently undergoing chemotherapy for AML.  She also has lower abdominal pain and states her highest temperature was 100.3.  Patient is afebrile here but tachycardic and blood pressures in the low 570V systolic.  Patient appears dehydrated but also concern for anemia as she is pale and  with the multiple bloody stools.  Concern for pancytopenia given patient's ongoing chemotherapy.  Labs including type and screen ordered.  We will plan on doing a CT of the abdomen pelvis for further evaluation.  Patient given 1 L of fluid due to her tachycardia and poor oral intake.  She is also complaining of pain no nausea and medications were given.  4:51 PM After pain medication patient reports she still having abdominal pain and was given further medication.  Chest x-ray within normal limits and EKG is unchanged from prior.  Lipase is within normal limits.  Lactate elevated at 3.6, CMP with hypokalemia of 2.8, preserved renal function and AST of 44.  Total bilirubin is 1 and anion gap is normal.  Hemoccult is positive for blood.  CBC shows recurrent pancytopenia with a WBC count of 0.8 with absolute neutrophils of 300, recurrent anemia with a hemoglobin of 6.1 and platelet count of less than 5.  Patient ordered blood transfusion.  She still having a lower abdominal pain and abdominal CT is pending.  Will discuss with oncology.  Patient was covered with Zosyn for concern for GI pathology.  She also has a lactate of 3.6 and received 2 L of IV fluids.  On repeat evaluation patient is not displaying signs of severe sepsis at this time.  MDM Number of Diagnoses or Management Options   Amount and/or Complexity of Data Reviewed Clinical lab tests: ordered and reviewed Tests in the radiology section of CPT: ordered and reviewed Tests in the medicine section of CPT: ordered and reviewed Decide to obtain previous medical records or to obtain history from someone other than the patient: yes Obtain history from someone other than the patient: yes Review and summarize past medical records: yes Discuss the patient with other providers: yes Independent visualization of images, tracings, or specimens: yes  Risk of Complications, Morbidity, and/or Mortality Presenting problems: high Diagnostic procedures:  high Management options: high  Patient Progress Patient progress: stable  CRITICAL CARE Performed by: Abagayle Klutts Total critical care time: 30 minutes Critical care time was exclusive of separately billable procedures and treating other patients. Critical care was necessary to treat or prevent imminent or life-threatening deterioration. Critical care was time spent personally by me on the following activities: development of treatment plan with patient and/or surrogate as well as nursing, discussions with consultants, evaluation of patient's response to treatment, examination of patient, obtaining history from patient or surrogate, ordering and performing treatments and interventions, ordering and review of laboratory studies, ordering and review of radiographic studies, pulse oximetry and re-evaluation of patient's condition.   Final Clinical Impression(s) / ED Diagnoses Final diagnoses:  Abdominal pain, unspecified abdominal location  Lower GI bleed  Pancytopenia [facility])    Rx / DC Orders ED Discharge Orders    None       Blanchie Dessert, MD 07/29/19 1654

## 2019-07-29 NOTE — Telephone Encounter (Signed)
Received vm message from patient. She states she had a very bad weekend including explosive bloody diarrhea and chills. She states she almost went to the ER but did not want to wait there for hours.  Of note, there were no on call messages from the on call service about her. She is asking for transportation to the cancer center today as she feels too weak to drive.  Arranged transportation for her through [facility] transportation services. They should be picking her up @ 1:45 pm. [name] aware of the above. TCT patient. No answer but was able to leave vm message for pt regarding transport pick up time. Asked pt to call back to let me know she got the message.  Attempted call to patient again and spoke with her.  She states she is till having very bloody stools and feels very weak. She states she won't be able to get the the transportation vehicle by herself.  No one is with her and her sons live out of town. Advised that her be transported by EMS. Patient asked if I would call 911 for her. Advised that I would. 911 called and information provided for pt transportation to the [facility] ER. Advised that the front door was unlocked.  [name] made aware of the above. He notified the ED of her pending arrival.

## 2019-07-29 NOTE — Progress Notes (Signed)
Pharmacy Antibiotic Note  Kelly Roberson is a 59 y.o. female admitted on 07/29/2019 with intra-abdominal infection.  Pharmacy has been consulted for Zosyn dosing.  Low grade fever, abdominal pain-->GI consult pending Neutropenic Renal function at baseline with CrCl>93ml/min  Plan: Zosyn 3.375g IV q8h (4 hour infusion).  No dose adjustment anticipated- Pharmacy will sign off and monitor peripherally via electronic surveillance software.   Height: 5\' 8"  (172.7 cm) Weight: 90.7 kg (200 lb) IBW/kg (Calculated) : 63.9  Temp (24hrs), Avg:99.2 F (37.3 C), Min:98.2 F (36.8 C), Max:99.8 F (37.7 C)  Recent Labs  Lab 07/29/19 1449  WBC 0.8*  CREATININE 0.54  LATICACIDVEN 3.6*    Estimated Creatinine Clearance: 88.1 mL/min (by C-G formula based on SCr of 0.54 mg/dL).    Allergies  Allergen Reactions   Lisinopril Cough    Antimicrobials this admission: 7/26 Zosyn >>   Dose adjustments this admission:  Microbiology results:  Thank you for allowing pharmacy to be a part of this patients care.  Netta Cedars PharmD 07/29/2019 11:40 PM

## 2019-07-30 ENCOUNTER — Other Ambulatory Visit: Payer: Self-pay

## 2019-07-30 DIAGNOSIS — D61818 Other pancytopenia: Secondary | ICD-10-CM

## 2019-07-30 DIAGNOSIS — K529 Noninfective gastroenteritis and colitis, unspecified: Secondary | ICD-10-CM

## 2019-07-30 DIAGNOSIS — K921 Melena: Secondary | ICD-10-CM

## 2019-07-30 LAB — CBC WITH DIFFERENTIAL/PLATELET
Abs Immature Granulocytes: 0.27 10*3/uL — ABNORMAL HIGH (ref 0.00–0.07)
Abs Immature Granulocytes: 0.39 10*3/uL — ABNORMAL HIGH (ref 0.00–0.07)
Basophils Absolute: 0 10*3/uL (ref 0.0–0.1)
Basophils Absolute: 0 10*3/uL (ref 0.0–0.1)
Basophils Relative: 1 %
Basophils Relative: 1 %
Eosinophils Absolute: 0 10*3/uL (ref 0.0–0.5)
Eosinophils Absolute: 0 10*3/uL (ref 0.0–0.5)
Eosinophils Relative: 0 %
Eosinophils Relative: 0 %
HCT: 19.4 % — ABNORMAL LOW (ref 36.0–46.0)
HCT: 24.2 % — ABNORMAL LOW (ref 36.0–46.0)
Hemoglobin: 6.6 g/dL — CL (ref 12.0–15.0)
Hemoglobin: 8.3 g/dL — ABNORMAL LOW (ref 12.0–15.0)
Immature Granulocytes: 7 %
Immature Granulocytes: 7 %
Lymphocytes Relative: 15 %
Lymphocytes Relative: 11 %
Lymphs Abs: 0.6 10*3/uL — ABNORMAL LOW (ref 0.7–4.0)
Lymphs Abs: 0.7 10*3/uL (ref 0.7–4.0)
MCH: 31.7 pg (ref 26.0–34.0)
MCH: 31.4 pg (ref 26.0–34.0)
MCHC: 34 g/dL (ref 30.0–36.0)
MCHC: 34.3 g/dL (ref 30.0–36.0)
MCV: 93.3 fL (ref 80.0–100.0)
MCV: 91.7 fL (ref 80.0–100.0)
Monocytes Absolute: 0.7 10*3/uL (ref 0.1–1.0)
Monocytes Absolute: 1.1 10*3/uL — ABNORMAL HIGH (ref 0.1–1.0)
Monocytes Relative: 20 %
Monocytes Relative: 19 %
Neutro Abs: 2.1 10*3/uL (ref 1.7–7.7)
Neutro Abs: 3.5 10*3/uL (ref 1.7–7.7)
Neutrophils Relative %: 57 %
Neutrophils Relative %: 62 %
Platelets: 13 10*3/uL — CL (ref 150–400)
Platelets: 28 10*3/uL — CL (ref 150–400)
RBC: 2.08 MIL/uL — ABNORMAL LOW (ref 3.87–5.11)
RBC: 2.64 MIL/uL — ABNORMAL LOW (ref 3.87–5.11)
RDW: 17.2 % — ABNORMAL HIGH (ref 11.5–15.5)
RDW: 18.2 % — ABNORMAL HIGH (ref 11.5–15.5)
WBC: 3.7 10*3/uL — ABNORMAL LOW (ref 4.0–10.5)
WBC: 5.7 10*3/uL (ref 4.0–10.5)
nRBC: 0 % (ref 0.0–0.2)
nRBC: 0 % (ref 0.0–0.2)

## 2019-07-30 LAB — COMPREHENSIVE METABOLIC PANEL
ALT: 32 U/L (ref 0–44)
AST: 37 U/L (ref 15–41)
Albumin: 2.9 g/dL — ABNORMAL LOW (ref 3.5–5.0)
Alkaline Phosphatase: 62 U/L (ref 38–126)
Anion gap: 10 (ref 5–15)
BUN: 13 mg/dL (ref 6–20)
CO2: 24 mmol/L (ref 22–32)
Calcium: 8.3 mg/dL — ABNORMAL LOW (ref 8.9–10.3)
Chloride: 101 mmol/L (ref 98–111)
Creatinine, Ser: 0.38 mg/dL — ABNORMAL LOW (ref 0.44–1.00)
GFR calc Af Amer: >60 mL/min (ref >60)
GFR calc non Af Amer: >60 mL/min (ref >60)
Glucose, Bld: 92 mg/dL (ref 70–99)
Potassium: 2.8 mmol/L — ABNORMAL LOW (ref 3.5–5.1)
Sodium: 135 mmol/L (ref 135–145)
Total Bilirubin: 3 mg/dL — ABNORMAL HIGH (ref 0.3–1.2)
Total Protein: 5.8 g/dL — ABNORMAL LOW (ref 6.5–8.1)

## 2019-07-30 LAB — CBC
HCT: 17.4 % — ABNORMAL LOW (ref 36.0–46.0)
Hemoglobin: 5.8 g/dL — CL (ref 12.0–15.0)
MCH: 32.6 pg (ref 26.0–34.0)
MCHC: 33.3 g/dL (ref 30.0–36.0)
MCV: 97.8 fL (ref 80.0–100.0)
Platelets: <5 10*3/uL — CL (ref 150–400)
RBC: 1.78 MIL/uL — ABNORMAL LOW (ref 3.87–5.11)
RDW: 15.7 % — ABNORMAL HIGH (ref 11.5–15.5)
WBC: 2 10*3/uL — ABNORMAL LOW (ref 4.0–10.5)
nRBC: 0 % (ref 0.0–0.2)

## 2019-07-30 LAB — TROPONIN I (HIGH SENSITIVITY)
Troponin I (High Sensitivity): 61 ng/L — ABNORMAL HIGH (ref <18)
Troponin I (High Sensitivity): 46 ng/L — ABNORMAL HIGH (ref <18)

## 2019-07-30 LAB — LACTIC ACID, PLASMA: Lactic Acid, Venous: 1.5 mmol/L (ref 0.5–1.9)

## 2019-07-30 LAB — MAGNESIUM
Magnesium: 1.5 mg/dL — ABNORMAL LOW (ref 1.7–2.4)
Magnesium: 2 mg/dL (ref 1.7–2.4)

## 2019-07-30 LAB — PREPARE RBC (CROSSMATCH)

## 2019-07-30 LAB — PHOSPHORUS: Phosphorus: 3.5 mg/dL (ref 2.5–4.6)

## 2019-07-30 LAB — SARS CORONAVIRUS 2 BY RT PCR (HOSPITAL ORDER, PERFORMED IN ~~LOC~~ HOSPITAL LAB): SARS Coronavirus 2: NEGATIVE

## 2019-07-30 MED ORDER — SODIUM CHLORIDE 0.9% IV SOLUTION: Freq: Once | INTRAVENOUS | Status: AC

## 2019-07-30 MED ORDER — SODIUM CHLORIDE 0.9% FLUSH
10.0000 mL | Freq: Two times a day (BID) | INTRAVENOUS | Status: DC
  Administered 2019-07-30 – 2019-08-01 (×5): 10 mL
  Administered 2019-08-02: 20 mL

## 2019-07-30 MED ORDER — POTASSIUM CHLORIDE 10 MEQ/100ML IV SOLN
10.0000 meq | INTRAVENOUS | Status: AC
  Administered 2019-07-30 (×4): 10 meq via INTRAVENOUS
  Filled 2019-07-30 (×4): qty 100

## 2019-07-30 MED ORDER — POTASSIUM CHLORIDE CRYS ER 20 MEQ PO TBCR
40.0000 meq | EXTENDED_RELEASE_TABLET | Freq: Once | ORAL | Status: AC
  Administered 2019-07-30: 40 meq via ORAL
  Filled 2019-07-30: qty 2

## 2019-07-30 MED ORDER — OXYCODONE-ACETAMINOPHEN 5-325 MG PO TABS
1.0000 | ORAL_TABLET | ORAL | Status: DC | PRN
  Administered 2019-08-01: 2 via ORAL
  Filled 2019-07-30 (×3): qty 2

## 2019-07-30 MED ORDER — LORAZEPAM 2 MG/ML IJ SOLN
1.0000 mg | Freq: Once | INTRAMUSCULAR | Status: AC
  Administered 2019-07-30: 1 mg via INTRAVENOUS
  Filled 2019-07-30: qty 1

## 2019-07-30 MED ORDER — HYDROMORPHONE HCL 1 MG/ML IJ SOLN
0.5000 mg | INTRAMUSCULAR | Status: DC | PRN
  Administered 2019-07-30 – 2019-08-03 (×12): 0.5 mg via INTRAVENOUS
  Filled 2019-07-30 (×12): qty 0.5

## 2019-07-30 MED ORDER — SODIUM CHLORIDE 0.9% IV SOLUTION: Freq: Once | INTRAVENOUS | Status: DC

## 2019-07-30 MED ORDER — TRAZODONE HCL 50 MG PO TABS
50.0000 mg | ORAL_TABLET | Freq: Every evening | ORAL | Status: AC | PRN
  Administered 2019-07-30: 50 mg via ORAL
  Filled 2019-07-30: qty 1

## 2019-07-30 MED ORDER — POTASSIUM CHLORIDE 20 MEQ PO PACK: 40.0000 meq | PACK | Freq: Once | ORAL | Status: DC

## 2019-07-30 MED ORDER — MAGNESIUM SULFATE 2 GM/50ML IV SOLN
2.0000 g | Freq: Once | INTRAVENOUS | Status: AC
  Administered 2019-07-30: 2 g via INTRAVENOUS
  Filled 2019-07-30: qty 50

## 2019-07-30 MED ORDER — POTASSIUM CHLORIDE 20 MEQ PO PACK
40.0000 meq | PACK | Freq: Once | ORAL | Status: DC
  Filled 2019-07-30: qty 2

## 2019-07-30 MED ORDER — SODIUM CHLORIDE 0.9% FLUSH: 10.0000 mL | INTRAVENOUS | Status: DC | PRN

## 2019-07-30 NOTE — Progress Notes (Signed)
PROGRESS NOTE    Kelly Roberson  HGD:[id number] DOB: Jun 08, 1960 DOA: 07/29/2019 PCP: Ladell Pier, MD   Brief Narrative:  Kelly Roberson is a 59 y.o. female with medical history significant of AML currently on chemotherapy, anxiety, hypertension, IBS, history of gastric bypass surgery in 2013 presenting with complaints of bloody diarrhea and abdominal pain.  Patient reports 3-day history of multiple episodes of nonbloody diarrhea and bilateral lower quadrant abdominal pain.  She has also had nausea and dry heaves.  She is not on any blood thinners.  Reports having fever and chills 2 days ago.  States she has received blood and platelet transfusions previously as she is currently on chemotherapy.  She is being comanaged by oncology at [facility] and here in [place].  Does report a few episodes of chest discomfort and dyspnea on exertion recently.  Denies any chest pain or dyspnea at present. ED Labs showing worsening pancytopenia-WBC count 0.8 (ANC 0.3), hemoglobin 6.1, and platelet count less than 5k.  FOBT positive.  Potassium 2.8.  Lactic acid 3.6.  Lipase normal and no significant elevation of LFTs.  Blood culture x2 pending. CT abdomen pelvis showing findings suggestive of mild colitis/typhilitis involving the right colon and cecum.  No surrounding loculated fluid collections or free air.Oncology recommended blood and platelet transfusions.  Recommended against doing a Covid swab due to patient's neutropenia and thrombocytopenia. GI consulted recommended to transfuse platelets and PRBC, hold colonoscopy until platelet count and hemoglobin improves. Admitted for sepsis secondary to colitis, pancytopenia and GI bleeding.   Assessment & Plan:   Principal Problem:   Colitis Active Problems:   AML (acute myeloblastic leukemia) ([place])   Sepsis ([place])   Hypokalemia   Pancytopenia ([place])  Sepsis secondary to colitis: Patient is presenting with complaints of abdominal pain and bloody diarrhea.   She had a low-grade temperature.  She was slightly tachycardic on arrival and blood pressure soft.  Tachycardia has resolved and blood pressure improved after IV fluid bolus.  Labs showing worsening neutropenia and anemia.  FOBT positive.  Lactic acid 3.6. CT abdomen pelvis showing findings suggestive of mild colitis/typhilitis involving the right colon and cecum.  No surrounding loculated fluid collections or free air. -Continue Zosyn and IV fluid hydration.  Morphine as needed for pain.  Zofran as needed for nausea.  Trend lactic acid.  Blood culture x2 pending.  Since patient is currently hemodynamically stable. C. difficile PCR and GI pathogen panel have also been ordered and currently pending. -GI consulted recommended patient is not stable for colonoscopy given thrombocytopenia and anemia. Transfuse PRBC and platelet until hemoglobin and platelets are stable.  Symptomatic anemia/ pancytopenia: Likely related to chemotherapy.  Now worsened on labs - WBC count 0.8 (ANC 0.3), hemoglobin 6.1, and platelet count less than 5k.  Oncology recommended blood and platelet transfusions. -Type and screen.  2 units PRBCs and 1 unit platelets ordered in the ED.  Order posttransfusion CBC and continue to monitor closely.   -Oncology consulted recommended transfuse platelets count for less than 20,000.   Chest pain, dyspnea on exertion: Likely due to symptomatic anemia.  Currently asymptomatic.  EKG without acute ischemic changes. -Cardiac monitoring.  Check high-sensitivity troponin level.  Continue blood transfusions as mentioned above.  Hypokalemia: Likely due to GI loss from diarrhea and home diuretic use.  Potassium 2.8.  EKG without acute changes. -Cardiac monitoring.  Replete potassium.  Check magnesium level and replete if low.  Continue to monitor electrolytes.  Hold diuretic at this time.  AML: Followed by oncology and currently on chemotherapy.   -[name] consulted recommended transfuse  platelets for count less than 20,000 transfuse PRBC to keep hemoglobin above 8.  Hypertension -Hold antihypertensives at this time given concern for sepsis/soft blood pressure.   DVT prophylaxis:  SCDS Code Status: Full  Family Communication: No family at bedside. Disposition Plan:  Dispo: The patient is from: Home  Anticipated d/c is to: Home  Anticipated d/c date is: > 3 days  Patient currently is not medically stable to d/c.  Consultants:   GI, hematology  Procedures: None Antimicrobials:  Anti-infectives (From admission, onward)   Start     Dose/Rate Route Frequency Ordered Stop   07/30/19 0200  piperacillin-tazobactam (ZOSYN) IVPB 3.375 g     Discontinue     3.375 g 12.5 mL/hr over 240 Minutes Intravenous Every 8 hours 07/29/19 2339     07/29/19 1700  piperacillin-tazobactam (ZOSYN) IVPB 3.375 g        3.375 g 100 mL/hr over 30 Minutes Intravenous  Once 07/29/19 1650 07/29/19 2020      Subjective: Patient was seen and examined at bedside.  She reports feeling very weak still has rectal bleeding, reports dizziness.  She is getting 2 units of packed red blood cells and platelets.  Objective: Vitals:   07/30/19 1214 07/30/19 1257 07/30/19 1312 07/30/19 1330  BP: (!) 108/51 120/77 119/71 121/74  Pulse: 80 89 83 84  Resp: 17 22 23  (!) 25  Temp:  98.9 F (37.2 C) 98.4 F (36.9 C)   TempSrc:  Oral Oral   SpO2: 100% 100% 100% 100%  Weight:      Height:        Intake/Output Summary (Last 24 hours) at 07/30/2019 1438 Last data filed at 07/30/2019 1415 Gross per 24 hour  Intake 3218.17 ml  Output --  Net 3218.17 ml   Filed Weights   07/29/19 1423  Weight: 90.7 kg    Examination:  General exam: Appears calm and comfortable  Respiratory system: Clear to auscultation. Respiratory effort normal. Cardiovascular system: S1 & S2 heard, RRR. No JVD, murmurs, rubs, gallops or clicks. No pedal edema. Gastrointestinal system:  Abdomen is nondistended, soft and nontender. No organomegaly or masses felt. Normal bowel sounds heard. Central nervous system: Alert and oriented. No focal neurological deficits. Extremities: Symmetric 5 x 5 power. Skin: No rashes, lesions or ulcers Psychiatry: Judgement and insight appear normal. Mood & affect appropriate.     Data Reviewed: I have personally reviewed following labs and imaging studies  CBC: Recent Labs  Lab 07/29/19 1449 07/30/19 0029 07/30/19 0847  WBC 0.8* 2.0* 3.7*  NEUTROABS 0.3*  --  2.1  HGB 6.1* 5.8* 6.6*  HCT 18.6* 17.4* 19.4*  MCV 100.0 97.8 93.3  PLT <5* <5* 13*   Basic Metabolic Panel: Recent Labs  Lab 07/29/19 1449 07/30/19 0029 07/30/19 0900  NA 133*  --  135  K 2.8*  --  2.8*  CL 102  --  101  CO2 21*  --  24  GLUCOSE 122*  --  92  BUN 18  --  13  CREATININE 0.54  --  0.38*  CALCIUM 8.0*  --  8.3*  MG  --  1.5* 2.0  PHOS  --   --  3.5   GFR: Estimated Creatinine Clearance: 88.1 mL/min (A) (by C-G formula based on SCr of 0.38 mg/dL (L)). Liver Function Tests: Recent Labs  Lab 07/29/19 1449 07/30/19 0900  AST 44* 37  ALT 35 32  ALKPHOS 61 62  BILITOT 1.0 3.0*  PROT 5.3* 5.8*  ALBUMIN 2.6* 2.9*   Recent Labs  Lab 07/29/19 1449  LIPASE 14   No results for input(s): AMMONIA in the last 168 hours. Coagulation Profile: No results for input(s): INR, PROTIME in the last 168 hours. Cardiac Enzymes: No results for input(s): CKTOTAL, CKMB, CKMBINDEX, TROPONINI in the last 168 hours. BNP (last 3 results) No results for input(s): PROBNP in the last 8760 hours. HbA1C: No results for input(s): HGBA1C in the last 72 hours. CBG: No results for input(s): GLUCAP in the last 168 hours. Lipid Profile: No results for input(s): CHOL, HDL, LDLCALC, TRIG, CHOLHDL, LDLDIRECT in the last 72 hours. Thyroid Function Tests: No results for input(s): TSH, T4TOTAL, FREET4, T3FREE, THYROIDAB in the last 72 hours. Anemia Panel: No results for  input(s): VITAMINB12, FOLATE, FERRITIN, TIBC, IRON, RETICCTPCT in the last 72 hours. Sepsis Labs: Recent Labs  Lab 07/29/19 1449 07/30/19 0029  LATICACIDVEN 3.6* 1.5    Recent Results (from the past 240 hour(s))  Blood culture (routine x 2)     Status: None (Preliminary result)   Collection Time: 07/29/19  4:51 PM   Specimen: BLOOD  Result Value Ref Range Status   Specimen Description   Final    BLOOD RIGHT ANTECUBITAL Performed at [facility], [place] [address], [place], [place] [address]    Special Requests   Final    BOTTLES DRAWN AEROBIC AND ANAEROBIC Blood Culture adequate volume Performed at [facility] [address] [place], [place] [address]    Culture PENDING  Incomplete   Report Status PENDING  Incomplete  SARS Coronavirus 2 by RT PCR (hospital order, performed in [facility] hospital lab) Nasopharyngeal Nasopharyngeal Swab     Status: None   Collection Time: 07/30/19  8:51 AM   Specimen: Nasopharyngeal Swab  Result Value Ref Range Status   SARS Coronavirus 2 NEGATIVE NEGATIVE Final    Comment: (NOTE) SARS-CoV-2 target nucleic acids are NOT DETECTED.  The SARS-CoV-2 RNA is generally detectable in upper and lower respiratory specimens during the acute phase of infection. The lowest concentration of SARS-CoV-2 viral copies this assay can detect is 250 copies / mL. A negative result does not preclude SARS-CoV-2 infection and should not be used as the sole basis for treatment or other patient management decisions.  A negative result may occur with improper specimen collection / handling, submission of specimen other than nasopharyngeal swab, presence of viral mutation(s) within the areas targeted by this assay, and inadequate number of viral copies (<250 copies / mL). A negative result must be combined with clinical observations, patient history, and epidemiological information.  Fact Sheet for Patients:    StrictlyIdeas.no  Fact Sheet for Healthcare Providers: BankingDealers.co.za  This test is not yet approved or  cleared by the [place] FDA and has been authorized for detection and/or diagnosis of SARS-CoV-2 by FDA under an Emergency Use Authorization (EUA).  This EUA will remain in effect (meaning this test can be used) for the duration of the COVID-19 declaration under Section 564(b)(1) of the Act, 21 U.S.C. section 360bbb-3(b)(1), unless the authorization is terminated or revoked sooner.  Performed at [facility], [place] [address] [place], [place] [address]          Radiology Studies: CT ABDOMEN PELVIS W CONTRAST  Result Date: 07/29/2019 CLINICAL DATA:  History of leukemia, currently on chemotherapy, bloody diarrhea EXAM: CT ABDOMEN AND PELVIS  WITH CONTRAST TECHNIQUE: Multidetector CT imaging of the abdomen and pelvis was performed using the standard protocol following bolus administration of intravenous contrast. CONTRAST:  142mL OMNIPAQUE IOHEXOL 300 MG/ML  SOLN COMPARISON:  None. FINDINGS: Lower chest: The visualized heart size within normal limits. No pericardial fluid/thickening. No hiatal hernia. Streaky atelectasis or scarring seen at the right lung base. Hepatobiliary: There is a 1.7 cm low-density lesion seen in the posterior right liver lobe which partially fills in on delayed images, likely hemangioma. Main portal vein is patent. No evidence of calcified gallstones, gallbladder wall thickening or biliary dilatation. Pancreas: Unremarkable. No pancreatic ductal dilatation or surrounding inflammatory changes. Spleen: Normal in size without focal abnormality. Adrenals/Urinary Tract: Both adrenal glands appear normal. The kidneys and collecting system appear normal without evidence of urinary tract calculus or hydronephrosis. Bladder is unremarkable. Stomach/Bowel: The patient is status post Roux-en-Y gastric  bypass. The stomach and small bowel otherwise unremarkable. There appears to be mild wall thickening seen around the right colon/cecum with mild fat stranding changes. No loculated fluid collections however are noted. The appendix is unremarkable. The remainder of the colon is unremarkable. Vascular/Lymphatic: There are no enlarged mesenteric, retroperitoneal, or pelvic lymph nodes. Scattered aortic atherosclerotic calcifications are seen without aneurysmal dilatation. Reproductive: The uterus and adnexa are unremarkable. Other: No evidence of abdominal wall mass or hernia. Musculoskeletal: No acute or significant osseous findings. IMPRESSION: Findings which could be suggestive of mild colitis/typhlitis involving the right colon and cecum. No surrounding loculated fluid collections or free air. Aortic Atherosclerosis (ICD10-I70.0). Electronically Signed   By: Prudencio Pair M.D.   On: 07/29/2019 21:28   DG Chest Port 1 View  Result Date: 07/29/2019 CLINICAL DATA:  Weakness EXAM: PORTABLE CHEST 1 VIEW COMPARISON:  06/30/2019 FINDINGS: The heart size and mediastinal contours are within normal limits. Both lungs are clear. The visualized skeletal structures are unremarkable. IMPRESSION: No active disease. Electronically Signed   By: Donavan Foil M.D.   On: 07/29/2019 16:23     Scheduled Meds: . sodium chloride   Intravenous Once  . sodium chloride flush  10-40 mL Intracatheter Q12H   Continuous Infusions: . piperacillin-tazobactam (ZOSYN)  IV Stopped (07/30/19 0528)     LOS: 1 day    Time spent:  35 mins   Elleanna Melling, MD [facility]   If 7PM-7AM, please contact night-coverage

## 2019-07-30 NOTE — Consult Note (Signed)
Referring Provider:  [facility]         Primary Care Physician:  Ladell Pier, MD Primary Gastroenterologist:  Wilfrid Lund, MD            We were asked to see this patient for: rectal bleeding                 ASSESSMENT /  PLAN    Kelly Roberson is a 59 y.o. female PMH significant for, but not necessarily limited to,   Roux-en-Y, leukemia, IBS                                                                                                                               # Hematochezia with acute on chronic anemia / nausea / generalized lower abdominal pain / abnormal right colon on CT scan --Pain / bleeding could be secondary to typhlitis in setting of neutropenia. .WBC 0.8, abs neutrophil count 0.3. Count up to 3.7 now.  --Hgb 9.0 on 07/22/19. Hematochezia since Friday and presented with hgb of 5.8, up to 6.6 post 2 uPRBC.  More blood already ordered.  --Discussed with Hematology. Additional platelet transfusion may help with bleeding but also important should intervention be needed to control bleeding.    # Acute myeloid leukemia / severe pancytopenia --Undergoing chemotherapy at [facility].  --Platelets < 5, up to 13 post platelet transfusion.   # History of Roux-en-Y / Nissen fundoplication   HPI:    Chief Complaint: rectal bleeding  Kelly Roberson is a 59 y.o. female undergoing treatment for AML. On Friday evening she had a normal BM but this was later followed by onset of hematochezia and generalized lower abdominal pain with nausea. She has continued to have both the pain and bleeding. No prior history of rectal bleeding. Gives history of IBS with 2-3 loose BMs a day. Last colonoscopy was ~ 2018 in [place], believes a few polyps were removed. No NSAID use.    Past Medical History:  Diagnosis Date   Anxiety    Hypertension    IBS (irritable bowel syndrome)     Past Surgical History:  Procedure Laterality Date   GASTRIC BYPASS  6073   NISSEN FUNDOPLICATION   7106   Reversed in 2013   ORIF ANKLE FRACTURE Left 06/2017   PLACEMENT OF BREAST IMPLANTS      Prior to Admission medications   Medication Sig Start Date End Date Taking? Authorizing Provider  acyclovir (ZOVIRAX) 200 MG capsule Take 400 mg by mouth 2 (two) times daily.  05/31/19  Yes [provider]  entecavir (BARACLUDE) 0.5 MG tablet Take 0.5 mg by mouth daily.  06/01/19 05/31/20 Yes [provider]  escitalopram (LEXAPRO) 10 MG tablet Take 10 mg by mouth daily.  06/19/19 09/17/19 Yes [provider]  hydrochlorothiazide (HYDRODIURIL) 25 MG tablet Take 25 mg by mouth daily.  06/01/19 05/31/20 Yes [provider]  LORazepam (ATIVAN) 1 MG tablet Take  1 mg by mouth at bedtime as needed for anxiety or sleep.  06/18/19  Yes [provider]  losartan (COZAAR) 25 MG tablet Take 1 tablet (25 mg total) by mouth daily. 05/02/19  Yes Shalhoub, Sherryll Burger, MD  Multiple Vitamin (MULTIVITAMIN WITH MINERALS) TABS tablet Take 1 tablet by mouth daily. 07/04/19  Yes Sheikh, Omair Latif, DO  ondansetron (ZOFRAN) 4 MG tablet Take 1 tablet (4 mg total) by mouth every 6 (six) hours as needed for nausea. 07/03/19  Yes Sheikh, Omair Latif, DO  polyethylene glycol (MIRALAX / GLYCOLAX) 17 g packet Take 17 g by mouth daily as needed for moderate constipation or severe constipation. 07/03/19  Yes Sheikh, Omair Latif, DO  senna-docusate (SENOKOT-S) 8.6-50 MG tablet Take 1 tablet by mouth at bedtime. 07/03/19  Yes Sheikh, Omair Latif, DO  traMADol (ULTRAM) 50 MG tablet Take 1 tablet (50 mg total) by mouth every 8 (eight) hours as needed (mild pain). 07/03/19  Yes Sheikh, Omair Latif, DO  traZODone (DESYREL) 50 MG tablet Take 50 mg by mouth at bedtime as needed for sleep.  05/31/19  Yes [provider]  cyclobenzaprine (FLEXERIL) 5 MG tablet Take 1 tablet (5 mg total) by mouth 3 (three) times daily as needed for muscle spasms. Patient not taking: Reported on 07/29/2019 05/01/19    Vernelle Emerald, MD    Current Facility-Administered Medications  Medication Dose Route Frequency Provider Last Rate Last Admin   0.9 %  sodium chloride infusion (Manually program via Guardrails IV Fluids)   Intravenous Once Curcio, Roselie Awkward, NP       0.9 %  sodium chloride infusion   Intravenous Continuous Shela Leff, MD 125 mL/hr at 07/30/19 0323 New Bag at 07/30/19 0323   acetaminophen (TYLENOL) tablet 650 mg  650 mg Oral Q6H PRN Shela Leff, MD       Or   acetaminophen (TYLENOL) suppository 650 mg  650 mg Rectal Q6H PRN Shela Leff, MD       morphine 2 MG/ML injection 1 mg  1 mg Intravenous Q3H PRN Shela Leff, MD   1 mg at 07/30/19 0736   ondansetron (ZOFRAN) injection 4 mg  4 mg Intravenous Q6H PRN Shela Leff, MD       piperacillin-tazobactam (ZOSYN) IVPB 3.375 g  3.375 g Intravenous Q8H Thomes Lolling, [facility]   Stopped at 07/30/19 5732   potassium chloride 10 mEq in 100 mL IVPB  10 mEq Intravenous Q1 Hr x 4 Shawna Clamp, MD 100 mL/hr at 07/30/19 1032 10 mEq at 07/30/19 1032   Current Outpatient Medications  Medication Sig Dispense Refill   acyclovir (ZOVIRAX) 200 MG capsule Take 400 mg by mouth 2 (two) times daily.      entecavir (BARACLUDE) 0.5 MG tablet Take 0.5 mg by mouth daily.      escitalopram (LEXAPRO) 10 MG tablet Take 10 mg by mouth daily.      hydrochlorothiazide (HYDRODIURIL) 25 MG tablet Take 25 mg by mouth daily.      LORazepam (ATIVAN) 1 MG tablet Take 1 mg by mouth at bedtime as needed for anxiety or sleep.      losartan (COZAAR) 25 MG tablet Take 1 tablet (25 mg total) by mouth daily.     Multiple Vitamin (MULTIVITAMIN WITH MINERALS) TABS tablet Take 1 tablet by mouth daily. 30 tablet 0   ondansetron (ZOFRAN) 4 MG tablet Take 1 tablet (4 mg total) by mouth every 6 (six) hours as needed for nausea. 20 tablet 0  polyethylene glycol (MIRALAX / GLYCOLAX) 17 g packet Take 17 g by mouth daily as needed for  moderate constipation or severe constipation. 14 each 0   senna-docusate (SENOKOT-S) 8.6-50 MG tablet Take 1 tablet by mouth at bedtime. 30 tablet 0   traMADol (ULTRAM) 50 MG tablet Take 1 tablet (50 mg total) by mouth every 8 (eight) hours as needed (mild pain). 10 tablet 0   traZODone (DESYREL) 50 MG tablet Take 50 mg by mouth at bedtime as needed for sleep.      cyclobenzaprine (FLEXERIL) 5 MG tablet Take 1 tablet (5 mg total) by mouth 3 (three) times daily as needed for muscle spasms. (Patient not taking: Reported on 07/29/2019) 30 tablet 0    Allergies as of 07/29/2019 - Review Complete 07/29/2019  Allergen Reaction Noted   Lisinopril Cough 06/01/2018    Family History  Problem Relation Age of Onset   Pancreatic cancer Father    Colon cancer Maternal Aunt    Diabetes Maternal Uncle    Pancreatic cancer Maternal Grandfather     Social History   Socioeconomic History   Marital status: Divorced    Spouse name: Not on file   Number of children: Not on file   Years of education: Not on file   Highest education level: Not on file  Occupational History   Not on file  Tobacco Use   Smoking status: Former Smoker   Smokeless tobacco: Never Used  Substance and Sexual Activity   Alcohol use: Not Currently    Comment: occasionally    Drug use: Not Currently   Sexual activity: Not Currently  Other Topics Concern   Not on file  Social History Narrative   Not on file   Social Determinants of Health   Financial Resource Strain:    Difficulty of Paying Living Expenses:   Food Insecurity:    Worried About Charity fundraiser in the Last Year:    Arboriculturist in the Last Year:   Transportation Needs:    Film/video editor (Medical):    Lack of Transportation (Non-Medical):   Physical Activity:    Days of Exercise per Week:    Minutes of Exercise per Session:   Stress:    Feeling of Stress :   Social Connections:    Frequency of  Communication with Friends and Family:    Frequency of Social Gatherings with Friends and Family:    Attends Religious Services:    Active Member of Clubs or Organizations:    Attends Music therapist:    Marital Status:   Intimate Partner Violence:    Fear of Current or Ex-Partner:    Emotionally Abused:    Physically Abused:    Sexually Abused:     Review of Systems: Positive for fatigue. All systems reviewed and negative except where noted in HPI.  Physical Exam: Vital signs in last 24 hours: Temp:  [98.2 F (36.8 C)-99.8 F (37.7 C)] 98.5 F (36.9 C) (07/27 0657) Pulse Rate:  [76-107] 77 (07/27 1042) Resp:  [13-27] 16 (07/27 1042) BP: (92-128)/(52-90) 128/75 (07/27 1042) SpO2:  [96 %-100 %] 100 % (07/27 1042) Weight:  [90.7 kg] 90.7 kg (07/26 1423)   General:   Alert, well-developed, female in NAD Psych:  Pleasant, cooperative. Normal mood and affect. Eyes:  Pupils equal, sclera clear, no icterus.   Conjunctiva pink. Ears:  Normal auditory acuity. Nose:  No deformity, discharge,  or lesions. Neck:  Supple; no masses  Lungs:  Clear throughout to auscultation.   No wheezes, crackles, or rhonchi.  Heart:  Regular rate and rhythm; no murmurs, no lower extremity edema Abdomen:  Soft, non-distended, nontender, BS active, no palp mass   Rectal:  Scant bloody residual on DRE. No stool in vault. Small external hemorrhoid with superficial fissure on top.   Msk:  Symmetrical without gross deformities. . Neurologic:  Alert and  oriented x4;  grossly normal neurologically. Skin:  Scattered bruising.    Intake/Output from previous day: 07/26 0701 - 07/27 0700 In: 1393 [I.V.:20; Blood:1223; IV Piggyback:150] Out: -  Intake/Output this shift: Total I/O In: 378 [Blood:278; IV Piggyback:100] Out: -   Lab Results: Recent Labs    07/29/19 1449 07/30/19 0029 07/30/19 0847  WBC 0.8* 2.0* 3.7*  HGB 6.1* 5.8* 6.6*  HCT 18.6* 17.4* 19.4*  PLT <5* <5* 13*     BMET Recent Labs    07/29/19 1449 07/30/19 0900  NA 133* 135  K 2.8* 2.8*  CL 102 101  CO2 21* 24  GLUCOSE 122* 92  BUN 18 13  CREATININE 0.54 0.38*  CALCIUM 8.0* 8.3*   LFT Recent Labs    07/30/19 0900  PROT 5.8*  ALBUMIN 2.9*  AST 37  ALT 32  ALKPHOS 62  BILITOT 3.0*   PT/INR No results for input(s): LABPROT, INR in the last 72 hours. Hepatitis Panel No results for input(s): HEPBSAG, HCVAB, HEPAIGM, HEPBIGM in the last 72 hours.   . CBC Latest Ref Rng & Units 07/30/2019 07/30/2019 07/29/2019  WBC 4.0 - 10.5 K/uL 3.7(L) 2.0(L) 0.8(LL)  Hemoglobin 12.0 - 15.0 g/dL 6.6(LL) 5.8(LL) 6.1(LL)  Hematocrit 36 - 46 % 19.4(L) 17.4(L) 18.6(L)  Platelets 150 - 400 K/uL 13(LL) <5(LL) <5(LL)    . CMP Latest Ref Rng & Units 07/30/2019 07/29/2019 07/22/2019  Glucose 70 - 99 mg/dL 92 122(H) 102(H)  BUN 6 - 20 mg/dL 13 18 13   Creatinine 0.44 - 1.00 mg/dL 0.38(L) 0.54 0.63  Sodium 135 - 145 mmol/L 135 133(L) 140  Potassium 3.5 - 5.1 mmol/L 2.8(L) 2.8(L) 4.3  Chloride 98 - 111 mmol/L 101 102 105  CO2 22 - 32 mmol/L 24 21(L) 25  Calcium 8.9 - 10.3 mg/dL 8.3(L) 8.0(L) 9.2  Total Protein 6.5 - 8.1 g/dL 5.8(L) 5.3(L) 6.1(L)  Total Bilirubin 0.3 - 1.2 mg/dL 3.0(H) 1.0 1.2  Alkaline Phos 38 - 126 U/L 62 61 87  AST 15 - 41 U/L 37 44(H) 49(H)  ALT 0 - 44 U/L 32 35 44   Studies/Results: CT ABDOMEN PELVIS W CONTRAST  Result Date: 07/29/2019 CLINICAL DATA:  History of leukemia, currently on chemotherapy, bloody diarrhea EXAM: CT ABDOMEN AND PELVIS WITH CONTRAST TECHNIQUE: Multidetector CT imaging of the abdomen and pelvis was performed using the standard protocol following bolus administration of intravenous contrast. CONTRAST:  143mL OMNIPAQUE IOHEXOL 300 MG/ML  SOLN COMPARISON:  None. FINDINGS: Lower chest: The visualized heart size within normal limits. No pericardial fluid/thickening. No hiatal hernia. Streaky atelectasis or scarring seen at the right lung base. Hepatobiliary: There is  a 1.7 cm low-density lesion seen in the posterior right liver lobe which partially fills in on delayed images, likely hemangioma. Main portal vein is patent. No evidence of calcified gallstones, gallbladder wall thickening or biliary dilatation. Pancreas: Unremarkable. No pancreatic ductal dilatation or surrounding inflammatory changes. Spleen: Normal in size without focal abnormality. Adrenals/Urinary Tract: Both adrenal glands appear normal. The kidneys and collecting system appear normal without evidence of urinary tract calculus  or hydronephrosis. Bladder is unremarkable. Stomach/Bowel: The patient is status post Roux-en-Y gastric bypass. The stomach and small bowel otherwise unremarkable. There appears to be mild wall thickening seen around the right colon/cecum with mild fat stranding changes. No loculated fluid collections however are noted. The appendix is unremarkable. The remainder of the colon is unremarkable. Vascular/Lymphatic: There are no enlarged mesenteric, retroperitoneal, or pelvic lymph nodes. Scattered aortic atherosclerotic calcifications are seen without aneurysmal dilatation. Reproductive: The uterus and adnexa are unremarkable. Other: No evidence of abdominal wall mass or hernia. Musculoskeletal: No acute or significant osseous findings. IMPRESSION: Findings which could be suggestive of mild colitis/typhlitis involving the right colon and cecum. No surrounding loculated fluid collections or free air. Aortic Atherosclerosis (ICD10-I70.0). Electronically Signed   By: Prudencio Pair M.D.   On: 07/29/2019 21:28   DG Chest Port 1 View  Result Date: 07/29/2019 CLINICAL DATA:  Weakness EXAM: PORTABLE CHEST 1 VIEW COMPARISON:  06/30/2019 FINDINGS: The heart size and mediastinal contours are within normal limits. Both lungs are clear. The visualized skeletal structures are unremarkable. IMPRESSION: No active disease. Electronically Signed   By: Donavan Foil M.D.   On: 07/29/2019 16:23     Principal Problem:   Colitis Active Problems:   AML (acute myeloblastic leukemia) ([place])   Sepsis ([place])   Hypokalemia   Pancytopenia ([place])    Tye Savoy, NP-C @  07/30/2019, 11:11 AM

## 2019-07-30 NOTE — Progress Notes (Signed)
Pt has has 1 loose stool about 1930 that was bloody and not sent for Cdiff because of blood & urine mixture. Order timeframe has expired for stool collection

## 2019-07-30 NOTE — ED Notes (Signed)
Report called to floor

## 2019-07-30 NOTE — Progress Notes (Signed)
Patient arrives to room 1531 at this time via stretcher from ED

## 2019-07-30 NOTE — Consult Note (Signed)
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 Found to have pancytopenia and peripheral blasts with intracellular inclusions.  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 4) 05/16/2019: repeat Bmbx shows no residual leukemia 5) 06/17/2019: patient returned for Cycle 1 Consolidation with cytarabine and gemtuzumab 6) 06/24/2019: presents to reconnect with [facility] for co-managed care. 7) 07/15/2019: Cycle 2 of consolidation therapy  CHIEF COMPLAINTS/PURPOSE OF CONSULTATION:  "Bright Red Blood in Stools "  HISTORY OF PRESENTING ILLNESS:  Kelly Roberson 59 y.o. 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  Mrs. Schrupp notes that he missed her scheduled lab check with [place] on Thursday due to attending a funeral.  She did leave a message with [place] that she would not be able to make it, but we were not able to reach her by phone to discuss the importance of this lab visit.  She reports that on Friday night she began having abdominal discomfort and bright red blood per rectum.  She notes that she had approximately 12 bowel movements over the course of the weekend which were all bright red in nature.  She did have an accident where she was unable to make it to the toilet and there was blood on the floor from the kitchen to the bathroom.  She photographed this and showed that to [place] during our visit today.  The patient was scheduled for a visit with [place] today, however when she did not  show we called her house and found that she was weak and felt unable to reach the car and therefore did not come to the appointment.  We offered to send an Melburn Popper, which she was afraid that she would have a bloody bowel movement and that [facility].  As such we recommended that she call EMS services in order to be transported to [facility] emergency department.  On arrival to the emergency department she was vitally stable and afebrile.  Her labs showed a white blood cell count of 0.8, hemoglobin 6.1, platelet count of less than 5, and an ANC of 0.3.  A CT scan of the abdomen was performed which showed results concerning for colitis versus typhlitis.  She was started on empiric Zosyn therapy and admitted for further evaluation and management.  On exam today Ms. Kleinschmidt notes that she is feeling about the same as she did yesterday.  She is continue to have abdominal pain and rates it about a 6 or 7 out of 10 in severity.  She reports that she has not had any lightheadedness or dizziness, though she has had a bowel movement here in the hospital which is bright red.  She notes that she is not having any fevers, chills, sweats, nausea, vomiting or headache.  A full 10 point ROS is listed below.   MEDICAL HISTORY:  Past Medical History:  Diagnosis Date  . Anxiety   . Hypertension   . IBS (irritable bowel syndrome)     SURGICAL HISTORY: Past Surgical History:  Procedure Laterality Date  . GASTRIC BYPASS  2013  . NISSEN FUNDOPLICATION  9518  Reversed in 2013  . ORIF ANKLE FRACTURE Left 06/2017  . PLACEMENT OF BREAST IMPLANTS      SOCIAL HISTORY: Social History   Socioeconomic History  . Marital status: Divorced    Spouse name: Not on file  . Number of children: Not on file  . Years of education: Not on file  . Highest education level: Not on file  Occupational History  . Not on file  Tobacco Use  . Smoking status: Former Research scientist (life sciences)  . Smokeless tobacco: Never Used  Substance and Sexual  Activity  . Alcohol use: Not Currently    Comment: occasionally   . Drug use: Not Currently  . Sexual activity: Not Currently  Other Topics Concern  . Not on file  Social History Narrative  . Not on file   Social Determinants of Health   Financial Resource Strain:   . Difficulty of Paying Living Expenses:   Food Insecurity:   . Worried About Charity fundraiser in the Last Year:   . Arboriculturist in the Last Year:   Transportation Needs:   . Film/video editor (Medical):   Marland Kitchen Lack of Transportation (Non-Medical):   Physical Activity:   . Days of Exercise per Week:   . Minutes of Exercise per Session:   Stress:   . Feeling of Stress :   Social Connections:   . Frequency of Communication with Friends and Family:   . Frequency of Social Gatherings with Friends and Family:   . Attends Religious Services:   . Active Member of Clubs or Organizations:   . Attends Archivist Meetings:   Marland Kitchen Marital Status:   Intimate Partner Violence:   . Fear of Current or Ex-Partner:   . Emotionally Abused:   Marland Kitchen Physically Abused:   . Sexually Abused:     FAMILY HISTORY: Family History  Problem Relation Age of Onset  . Pancreatic cancer Father   . Colon cancer Maternal Aunt   . Diabetes Maternal Uncle   . Pancreatic cancer Maternal Grandfather     ALLERGIES:  is allergic to lisinopril.  MEDICATIONS:  Current Facility-Administered Medications  Medication Dose Route Frequency Provider Last Rate Last Admin  . 0.9 %  sodium chloride infusion (Manually program via Guardrails IV Fluids)   Intravenous Once Curcio, Kristin R, NP      . 0.9 %  sodium chloride infusion (Manually program via Guardrails IV Fluids)   Intravenous Once Maryanna Shape, NP      . acetaminophen (TYLENOL) tablet 650 mg  650 mg Oral Q6H PRN Shela Leff, MD       Or  . acetaminophen (TYLENOL) suppository 650 mg  650 mg Rectal Q6H PRN Shela Leff, MD      . morphine 2 MG/ML injection 1 mg  1  mg Intravenous Q3H PRN Shela Leff, MD   1 mg at 07/30/19 1153  . ondansetron (ZOFRAN) injection 4 mg  4 mg Intravenous Q6H PRN Shela Leff, MD      . piperacillin-tazobactam (ZOSYN) IVPB 3.375 g  3.375 g Intravenous Q8H Thomes Lolling, [facility]   Stopped at 07/30/19 8921  . potassium chloride 10 mEq in 100 mL IVPB  10 mEq Intravenous Q1 Hr x 4 Shawna Clamp, MD 100 mL/hr at 07/30/19 1156 10 mEq at 07/30/19 1156   Current Outpatient Medications  Medication Sig Dispense Refill  . acyclovir (ZOVIRAX) 200 MG capsule Take 400 mg by mouth 2 (two) times daily.     Marland Kitchen  entecavir (BARACLUDE) 0.5 MG tablet Take 0.5 mg by mouth daily.     Marland Kitchen escitalopram (LEXAPRO) 10 MG tablet Take 10 mg by mouth daily.     . hydrochlorothiazide (HYDRODIURIL) 25 MG tablet Take 25 mg by mouth daily.     Marland Kitchen LORazepam (ATIVAN) 1 MG tablet Take 1 mg by mouth at bedtime as needed for anxiety or sleep.     Marland Kitchen losartan (COZAAR) 25 MG tablet Take 1 tablet (25 mg total) by mouth daily.    . Multiple Vitamin (MULTIVITAMIN WITH MINERALS) TABS tablet Take 1 tablet by mouth daily. 30 tablet 0  . ondansetron (ZOFRAN) 4 MG tablet Take 1 tablet (4 mg total) by mouth every 6 (six) hours as needed for nausea. 20 tablet 0  . polyethylene glycol (MIRALAX / GLYCOLAX) 17 g packet Take 17 g by mouth daily as needed for moderate constipation or severe constipation. 14 each 0  . senna-docusate (SENOKOT-S) 8.6-50 MG tablet Take 1 tablet by mouth at bedtime. 30 tablet 0  . traMADol (ULTRAM) 50 MG tablet Take 1 tablet (50 mg total) by mouth every 8 (eight) hours as needed (mild pain). 10 tablet 0  . traZODone (DESYREL) 50 MG tablet Take 50 mg by mouth at bedtime as needed for sleep.     . cyclobenzaprine (FLEXERIL) 5 MG tablet Take 1 tablet (5 mg total) by mouth 3 (three) times daily as needed for muscle spasms. (Patient not taking: Reported on 07/29/2019) 30 tablet 0    REVIEW OF SYSTEMS:   Constitutional: ( - ) fevers, ( - )  chills , (  - ) night sweats Eyes: ( - ) blurriness of vision, ( - ) double vision, ( - ) watery eyes Ears, nose, mouth, throat, and face: ( - ) mucositis, ( - ) sore throat Respiratory: ( - ) cough, ( - ) dyspnea, ( - ) wheezes Cardiovascular: ( - ) palpitation, ( - ) chest discomfort, ( - ) lower extremity swelling Gastrointestinal:  ( - ) nausea, ( - ) heartburn, ( - ) change in bowel habits Skin: ( - ) abnormal skin rashes Lymphatics: ( - ) new lymphadenopathy, ( - ) easy bruising Neurological: ( - ) numbness, ( - ) tingling, ( - ) new weaknesses Behavioral/Psych: ( - ) mood change, ( - ) new changes  All other systems were reviewed with the patient and are negative.  PHYSICAL EXAMINATION:  Vitals:   07/30/19 1042 07/30/19 1102  BP: 128/75 120/85  Pulse: 77 78  Resp: 16 21  Temp:    SpO2: 100% 100%   Filed Weights   07/29/19 1423  Weight: 200 lb (90.7 kg)    GENERAL: chronically ill appearing middle aged Caucasian female in NAD  SKIN: skin color, texture, turgor are normal, no rashes or significant lesions EYES: conjunctiva are pink and non-injected, sclera clear LUNGS: clear to auscultation and percussion with normal breathing effort HEART: regular rate & rhythm and no murmurs and no lower extremity edema ABDOMEN: soft, non-tender, non-distended, normal bowel sounds Musculoskeletal: no cyanosis of digits and no clubbing  PSYCH: alert & oriented x 3, fluent speech NEURO: no focal motor/sensory deficits  LABORATORY DATA:  I have reviewed the data as listed CBC Latest Ref Rng & Units 07/30/2019 07/30/2019 07/29/2019  WBC 4.0 - 10.5 K/uL 3.7(L) 2.0(L) 0.8(LL)  Hemoglobin 12.0 - 15.0 g/dL 6.6(LL) 5.8(LL) 6.1(LL)  Hematocrit 36 - 46 % 19.4(L) 17.4(L) 18.6(L)  Platelets 150 - 400 K/uL 13(LL) <5(LL) <5(LL)  CMP Latest Ref Rng & Units 07/30/2019 07/29/2019 07/22/2019  Glucose 70 - 99 mg/dL 92 122(H) 102(H)  BUN 6 - 20 mg/dL 13 18 13   Creatinine 0.44 - 1.00 mg/dL 0.38(L) 0.54 0.63    Sodium 135 - 145 mmol/L 135 133(L) 140  Potassium 3.5 - 5.1 mmol/L 2.8(L) 2.8(L) 4.3  Chloride 98 - 111 mmol/L 101 102 105  CO2 22 - 32 mmol/L 24 21(L) 25  Calcium 8.9 - 10.3 mg/dL 8.3(L) 8.0(L) 9.2  Total Protein 6.5 - 8.1 g/dL 5.8(L) 5.3(L) 6.1(L)  Total Bilirubin 0.3 - 1.2 mg/dL 3.0(H) 1.0 1.2  Alkaline Phos 38 - 126 U/L 62 61 87  AST 15 - 41 U/L 37 44(H) 49(H)  ALT 0 - 44 U/L 32 35 44    RADIOGRAPHIC STUDIES: I have personally reviewed the radiological images as listed and agreed with the findings in the report: findings consistent with colitis of the bowel. No free air noted CT ANGIO HEAD W OR WO CONTRAST  Result Date: 07/02/2019 CLINICAL DATA:  Headache, acute, normal neuro exam. Additional provided: Headache radiates down both arms and into neck with numbness/weakness in both legs. EXAM: CT ANGIOGRAPHY HEAD AND NECK TECHNIQUE: Multidetector CT imaging of the head and neck was performed using the standard protocol during bolus administration of intravenous contrast. Multiplanar CT image reconstructions and MIPs were obtained to evaluate the vascular anatomy. Carotid stenosis measurements (when applicable) are obtained utilizing NASCET criteria, using the distal internal carotid diameter as the denominator. CONTRAST:  180mL OMNIPAQUE IOHEXOL 350 MG/ML SOLN COMPARISON:  Head CT performed 1 day prior 07/01/2019 FINDINGS: CT HEAD FINDINGS Brain: Stable, mild generalized parenchymal atrophy. 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 There is no acute intracranial hemorrhage. No demarcated cortical infarct. No extra-axial fluid collection. No evidence of intracranial mass. No midline shift. Vascular: Reported below. Skull: Normal. Negative for fracture or focal lesion. Sinuses: Tiny left maxillary sinus mucous retention cyst. No significant mastoid effusion Orbits: No acute abnormality. Review of the MIP images confirms the  above findings CTA NECK FINDINGS Aortic arch: Standard aortic branching. Mild calcified plaque within the visualized aortic arch. Streak artifact from a dense right-sided contrast bolus limits evaluation of the mid to distal right subclavian artery. No hemodynamically significant innominate or proximal subclavian artery stenosis. Right carotid system: CCA and ICA patent within the neck without significant stenosis (50% or greater). Mild mixed plaque within the carotid bifurcation and proximal ICA. Left carotid system: CCA and ICA patent within the neck without significant stenosis (50% or greater). Minimal mixed plaque within the proximal ICA. Vertebral arteries: The vertebral arteries are patent within the neck bilaterally without significant stenosis. The left vertebral artery is slightly dominant. Skeleton: No acute bony abnormality or aggressive osseous lesion. Other neck: No neck mass or cervical lymphadenopathy. Thyroid unremarkable. Upper chest: 8 mm ground-glass nodule within the left upper lobe (series 13, image 331). Review of the MIP images confirms the above findings CTA HEAD FINDINGS Anterior circulation: The intracranial internal carotid arteries are patent without significant stenosis. The M1 middle cerebral arteries are patent without significant stenosis. No M2 proximal branch occlusion or high-grade proximal stenosis is identified. The anterior cerebral arteries are patent without significant proximal stenosis. There is a 1-2 mm vascular protrusion arising from the supraclinoid right ICA which may reflect a tiny aneurysm or infundibulum (series 15, image 92). A 1-2 mm vascular protrusion arising from the supraclinoid left ICA appears to reflect a  small infundibulum (series 15, image 113). Posterior circulation: The intracranial vertebral arteries are patent without significant stenosis, as is the basilar artery. The posterior cerebral arteries are patent bilaterally without significant proximal  stenosis posterior communicating arteries are hypoplastic or absent bilaterally. Venous sinuses: Within limitations of contrast timing, no convincing thrombus. Anatomic variants: As described Review of the MIP images confirms the above findings IMPRESSION: CT head: 1. No CT evidence of acute intracranial abnormality. 2. Stable mild generalized parenchymal atrophy. 3. 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 4. Tiny left maxillary sinus mucous retention cyst. CTA neck: 1. The common carotid, internal carotid and vertebral arteries are patent within the neck without hemodynamically significant stenosis. Mild mixed plaque within the right carotid bifurcation, proximal right ICA and proximal left ICA. There is also minimal calcified plaque within the visualized aortic arch. 2. 8 mm ground-glass nodule within the imaged left lung apex. Initial follow-up with CT at 6-12 months is recommended to confirm persistence. If persistent, repeat CT is recommended every 2 years until 5 years of stability has been established. This recommendation follows the consensus statement: Guidelines for Management of Incidental Pulmonary Nodules Detected on CT Images: From the Fleischner Society 2017; Radiology 2017; 284:228-243. CTA head: 1. No intracranial large vessel occlusion or proximal high-grade arterial stenosis. 2. 1-2 mm infundibulum versus tiny aneurysm arising from the supraclinoid right ICA. Electronically Signed   By: Kellie Simmering DO   On: 07/02/2019 12:47   CT HEAD WO CONTRAST  Result Date: 07/01/2019 CLINICAL DATA:  Headache with intracranial hemorrhage suspected. Pancytopenia. EXAM: CT HEAD WITHOUT CONTRAST TECHNIQUE: Contiguous axial images were obtained from the base of the skull through the vertex without intravenous contrast. COMPARISON:  None. FINDINGS: Brain: No evidence of acute cortical infarction, hemorrhage, hydrocephalus, extra-axial  collection or mass lesion/mass effect. 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 Vascular: No hyperdense vessel or unexpected calcification. Skull: Normal. Negative for fracture or focal lesion. Sinuses/Orbits: Negative.  No visualized sinusitis. IMPRESSION: No acute finding.  Negative for intracranial hemorrhage. Electronically Signed   By: Monte Fantasia M.D.   On: 07/01/2019 05:23   CT ANGIO NECK W OR WO CONTRAST  Result Date: 07/02/2019 CLINICAL DATA:  Headache, acute, normal neuro exam. Additional provided: Headache radiates down both arms and into neck with numbness/weakness in both legs. EXAM: CT ANGIOGRAPHY HEAD AND NECK TECHNIQUE: Multidetector CT imaging of the head and neck was performed using the standard protocol during bolus administration of intravenous contrast. Multiplanar CT image reconstructions and MIPs were obtained to evaluate the vascular anatomy. Carotid stenosis measurements (when applicable) are obtained utilizing NASCET criteria, using the distal internal carotid diameter as the denominator. CONTRAST:  187mL OMNIPAQUE IOHEXOL 350 MG/ML SOLN COMPARISON:  Head CT performed 1 day prior 07/01/2019 FINDINGS: CT HEAD FINDINGS Brain: Stable, mild generalized parenchymal atrophy. 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 There is no acute intracranial hemorrhage. No demarcated cortical infarct. No extra-axial fluid collection. No evidence of intracranial mass. No midline shift. Vascular: Reported below. Skull: Normal. Negative for fracture or focal lesion. Sinuses: Tiny left maxillary sinus mucous retention cyst. No significant mastoid effusion Orbits: No acute abnormality. Review of the MIP images confirms the above findings CTA NECK FINDINGS Aortic arch: Standard aortic branching. Mild calcified plaque within the visualized aortic arch. Streak artifact from a dense  right-sided contrast bolus limits evaluation of  the mid to distal right subclavian artery. No hemodynamically significant innominate or proximal subclavian artery stenosis. Right carotid system: CCA and ICA patent within the neck without significant stenosis (50% or greater). Mild mixed plaque within the carotid bifurcation and proximal ICA. Left carotid system: CCA and ICA patent within the neck without significant stenosis (50% or greater). Minimal mixed plaque within the proximal ICA. Vertebral arteries: The vertebral arteries are patent within the neck bilaterally without significant stenosis. The left vertebral artery is slightly dominant. Skeleton: No acute bony abnormality or aggressive osseous lesion. Other neck: No neck mass or cervical lymphadenopathy. Thyroid unremarkable. Upper chest: 8 mm ground-glass nodule within the left upper lobe (series 13, image 331). Review of the MIP images confirms the above findings CTA HEAD FINDINGS Anterior circulation: The intracranial internal carotid arteries are patent without significant stenosis. The M1 middle cerebral arteries are patent without significant stenosis. No M2 proximal branch occlusion or high-grade proximal stenosis is identified. The anterior cerebral arteries are patent without significant proximal stenosis. There is a 1-2 mm vascular protrusion arising from the supraclinoid right ICA which may reflect a tiny aneurysm or infundibulum (series 15, image 92). A 1-2 mm vascular protrusion arising from the supraclinoid left ICA appears to reflect a small infundibulum (series 15, image 113). Posterior circulation: The intracranial vertebral arteries are patent without significant stenosis, as is the basilar artery. The posterior cerebral arteries are patent bilaterally without significant proximal stenosis posterior communicating arteries are hypoplastic or absent bilaterally. Venous sinuses: Within limitations of contrast timing, no convincing thrombus.  Anatomic variants: As described Review of the MIP images confirms the above findings IMPRESSION: CT head: 1. No CT evidence of acute intracranial abnormality. 2. Stable mild generalized parenchymal atrophy. 3. 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 4. Tiny left maxillary sinus mucous retention cyst. CTA neck: 1. The common carotid, internal carotid and vertebral arteries are patent within the neck without hemodynamically significant stenosis. Mild mixed plaque within the right carotid bifurcation, proximal right ICA and proximal left ICA. There is also minimal calcified plaque within the visualized aortic arch. 2. 8 mm ground-glass nodule within the imaged left lung apex. Initial follow-up with CT at 6-12 months is recommended to confirm persistence. If persistent, repeat CT is recommended every 2 years until 5 years of stability has been established. This recommendation follows the consensus statement: Guidelines for Management of Incidental Pulmonary Nodules Detected on CT Images: From the Fleischner Society 2017; Radiology 2017; 284:228-243. CTA head: 1. No intracranial large vessel occlusion or proximal high-grade arterial stenosis. 2. 1-2 mm infundibulum versus tiny aneurysm arising from the supraclinoid right ICA. Electronically Signed   By: Kellie Simmering DO   On: 07/02/2019 12:47   CT ABDOMEN PELVIS W CONTRAST  Result Date: 07/29/2019 CLINICAL DATA:  History of leukemia, currently on chemotherapy, bloody diarrhea EXAM: CT ABDOMEN AND PELVIS WITH CONTRAST TECHNIQUE: Multidetector CT imaging of the abdomen and pelvis was performed using the standard protocol following bolus administration of intravenous contrast. CONTRAST:  172mL OMNIPAQUE IOHEXOL 300 MG/ML  SOLN COMPARISON:  None. FINDINGS: Lower chest: The visualized heart size within normal limits. No pericardial fluid/thickening. No hiatal hernia. Streaky atelectasis or scarring  seen at the right lung base. Hepatobiliary: There is a 1.7 cm low-density lesion seen in the posterior right liver lobe which partially fills in on delayed images, likely hemangioma. Main portal vein is patent. No evidence of calcified gallstones, gallbladder wall thickening or biliary  dilatation. Pancreas: Unremarkable. No pancreatic ductal dilatation or surrounding inflammatory changes. Spleen: Normal in size without focal abnormality. Adrenals/Urinary Tract: Both adrenal glands appear normal. The kidneys and collecting system appear normal without evidence of urinary tract calculus or hydronephrosis. Bladder is unremarkable. Stomach/Bowel: The patient is status post Roux-en-Y gastric bypass. The stomach and small bowel otherwise unremarkable. There appears to be mild wall thickening seen around the right colon/cecum with mild fat stranding changes. No loculated fluid collections however are noted. The appendix is unremarkable. The remainder of the colon is unremarkable. Vascular/Lymphatic: There are no enlarged mesenteric, retroperitoneal, or pelvic lymph nodes. Scattered aortic atherosclerotic calcifications are seen without aneurysmal dilatation. Reproductive: The uterus and adnexa are unremarkable. Other: No evidence of abdominal wall mass or hernia. Musculoskeletal: No acute or significant osseous findings. IMPRESSION: Findings which could be suggestive of mild colitis/typhlitis involving the right colon and cecum. No surrounding loculated fluid collections or free air. Aortic Atherosclerosis (ICD10-I70.0). Electronically Signed   By: Prudencio Pair M.D.   On: 07/29/2019 21:28   DG Chest Port 1 View  Result Date: 07/29/2019 CLINICAL DATA:  Weakness EXAM: PORTABLE CHEST 1 VIEW COMPARISON:  06/30/2019 FINDINGS: The heart size and mediastinal contours are within normal limits. Both lungs are clear. The visualized skeletal structures are unremarkable. IMPRESSION: No active disease. Electronically Signed   By:  Donavan Foil M.D.   On: 07/29/2019 16:23   DG Chest Port 1 View  Result Date: 06/30/2019 CLINICAL DATA:  Fever. EXAM: PORTABLE CHEST 1 VIEW COMPARISON:  None. FINDINGS: The heart size and mediastinal contours are within normal limits. Both lungs are clear. The visualized skeletal structures are unremarkable. IMPRESSION: No active disease. Electronically Signed   By: Constance Holster M.D.   On: 06/30/2019 23:49    ASSESSMENT & PLAN Kelly Roberson 59 y.o. 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  Unfortunately the patient has been found to have an infectious colitis/typhlitis in the setting of neutropenia.  Her [place] is currently rising and therefore there is no clear indication for G-CSF at this time.  She has remained afebrile throughout this course, though with the reconstitution of her immune system it is possible she may have higher temperatures later on.  In the setting of active bleed I would recommend a higher transfusion threshold of greater than 20 with an increased hemoglobin bar of 8.0.  We appreciate the assistance of GI in the management of this bleed, and it is possible that the infectious colitis alone is the only etiology.  We will continue to hold the patient in house until her counts have stabilized and the bright red blood per rectum has improved.  #Infectious Colitis/Typhilitis --agree with continued antibiotic therapy with zosyn. 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 Elmo Putt at [facility]  All questions were answered. The patient knows to call the clinic with any problems, questions or concerns.  A total of more  than 55 minutes were spent on this encounter and over half of that time was spent on counseling and coordination of care as outlined above.   Ledell Peoples, MD Department of Hematology/Oncology [facility] Phone: [phone] Pager: [phone] Email: [email or web address]  07/30/2019 11:57 AM

## 2019-07-30 NOTE — ED Notes (Signed)
Pt c/o pain 5/10 in abd and face. Offers no other complaints.

## 2019-07-30 NOTE — Progress Notes (Signed)
Dear Doctor: This patient has been identified as a candidate for PICC for the following reason (s): poor veins/poor circulatory system (CHF, COPD, emphysema, diabetes, steroid use, IV drug abuse, etc.) and restarts due to phlebitis and infiltration in 24 hours If you agree, please write an order for the indicated device.   Thank you for supporting the early vascular access assessment program.

## 2019-07-30 NOTE — ED Notes (Signed)
Assumed care of pt at this time. Pt resting in stretcher, no s/sx of acute distress at this time.  

## 2019-07-30 NOTE — Progress Notes (Signed)
HEMATOLOGY-ONCOLOGY PROGRESS NOTE  SUBJECTIVE: Seen in the emergency room this morning. Reports ongoing rectal bleeding. Has received 2 units PRBCs and 1 unit of platelets so far this admission. Repeat CBC this am showed hemoglobin of 6.6 and platelets are 13,000. TMax 99.8. Denies chest pain or shortness of breath. Bruising easily. Having difficulty with her veins blowing. CT abdomen/pelvis showed findings suggestive of mild colitis/typhlitis involving the right colon and cecum.   REVIEW OF SYSTEMS:   Constitutional: low grade fevers, no chills Eyes: Denies blurriness of vision Ears, nose, mouth, throat, and face: Denies mucositis or sore throat Respiratory: Denies cough, dyspnea or wheezes Cardiovascular: Denies palpitation, chest discomfort Gastrointestinal:  Ongoing bloody diarrhea Skin: Bruises easily.Denies abnormal skin rashes Lymphatics: Denies new lymphadenopathy Neurological:Denies numbness, tingling or new weaknesses Behavioral/Psych: Mood is stable, no new changes  Extremities: No lower extremity edema All other systems were reviewed with the patient and are negative.  I have reviewed the past medical history, past surgical history, social history and family history with the patient and they are unchanged from previous note.   PHYSICAL EXAMINATION: ECOG PERFORMANCE STATUS: 1 - Symptomatic but completely ambulatory  Vitals:   07/30/19 1102 07/30/19 1214  BP: 120/85 (!) 108/51  Pulse: 78 80  Resp: 21 17  Temp:    SpO2: 100% 100%   Filed Weights   07/29/19 1423  Weight: 90.7 kg    Intake/Output from previous day: 07/26 0701 - 07/27 0700 In: 1393 [I.V.:20; Blood:1223; IV Piggyback:150] Out: -   GENERAL:alert, no distress and comfortable SKIN: Multiple ecchymoses to arms and one below left eye EYES: normal, Conjunctiva are pink and non-injected, sclera clear OROPHARYNX:no exudate, no erythema and lips, buccal mucosa, and tongue normal  LUNGS: clear to auscultation  and percussion with normal breathing effort HEART: regular rate & rhythm and no murmurs and no lower extremity edema ABDOMEN:abdomen soft, non-tender and normal bowel sounds NEURO: alert & oriented x 3 with fluent speech, no focal motor/sensory deficits  LABORATORY DATA:  I have reviewed the data as listed CMP Latest Ref Rng & Units 07/30/2019 07/29/2019 07/22/2019  Glucose 70 - 99 mg/dL 92 122(H) 102(H)  BUN 6 - 20 mg/dL 13 18 13   Creatinine 0.44 - 1.00 mg/dL 0.38(L) 0.54 0.63  Sodium 135 - 145 mmol/L 135 133(L) 140  Potassium 3.5 - 5.1 mmol/L 2.8(L) 2.8(L) 4.3  Chloride 98 - 111 mmol/L 101 102 105  CO2 22 - 32 mmol/L 24 21(L) 25  Calcium 8.9 - 10.3 mg/dL 8.3(L) 8.0(L) 9.2  Total Protein 6.5 - 8.1 g/dL 5.8(L) 5.3(L) 6.1(L)  Total Bilirubin 0.3 - 1.2 mg/dL 3.0(H) 1.0 1.2  Alkaline Phos 38 - 126 U/L 62 61 87  AST 15 - 41 U/L 37 44(H) 49(H)  ALT 0 - 44 U/L 32 35 44    Lab Results  Component Value Date   WBC 3.7 (L) 07/30/2019   HGB 6.6 (LL) 07/30/2019   HCT 19.4 (L) 07/30/2019   MCV 93.3 07/30/2019   PLT 13 (LL) 07/30/2019   NEUTROABS 2.1 07/30/2019    CT ANGIO HEAD W OR WO CONTRAST  Result Date: 07/02/2019 CLINICAL DATA:  Headache, acute, normal neuro exam. Additional provided: Headache radiates down both arms and into neck with numbness/weakness in both legs. EXAM: CT ANGIOGRAPHY HEAD AND NECK TECHNIQUE: Multidetector CT imaging of the head and neck was performed using the standard protocol during bolus administration of intravenous contrast. Multiplanar CT image reconstructions and MIPs were obtained to evaluate the vascular anatomy.  Carotid stenosis measurements (when applicable) are obtained utilizing NASCET criteria, using the distal internal carotid diameter as the denominator. CONTRAST:  133mL OMNIPAQUE IOHEXOL 350 MG/ML SOLN COMPARISON:  Head CT performed 1 day prior 07/01/2019 FINDINGS: CT HEAD FINDINGS Brain: Stable, mild generalized parenchymal atrophy. Redemonstrated small  chronic lacunar infarct within the anterior limb of right internal capsule/right caudate head. There is no acute intracranial hemorrhage. No demarcated cortical infarct. No extra-axial fluid collection. No evidence of intracranial mass. No midline shift. Vascular: Reported below. Skull: Normal. Negative for fracture or focal lesion. Sinuses: Tiny left maxillary sinus mucous retention cyst. No significant mastoid effusion Orbits: No acute abnormality. Review of the MIP images confirms the above findings CTA NECK FINDINGS Aortic arch: Standard aortic branching. Mild calcified plaque within the visualized aortic arch. Streak artifact from a dense right-sided contrast bolus limits evaluation of the mid to distal right subclavian artery. No hemodynamically significant innominate or proximal subclavian artery stenosis. Right carotid system: CCA and ICA patent within the neck without significant stenosis (50% or greater). Mild mixed plaque within the carotid bifurcation and proximal ICA. Left carotid system: CCA and ICA patent within the neck without significant stenosis (50% or greater). Minimal mixed plaque within the proximal ICA. Vertebral arteries: The vertebral arteries are patent within the neck bilaterally without significant stenosis. The left vertebral artery is slightly dominant. Skeleton: No acute bony abnormality or aggressive osseous lesion. Other neck: No neck mass or cervical lymphadenopathy. Thyroid unremarkable. Upper chest: 8 mm ground-glass nodule within the left upper lobe (series 13, image 331). Review of the MIP images confirms the above findings CTA HEAD FINDINGS Anterior circulation: The intracranial internal carotid arteries are patent without significant stenosis. The M1 middle cerebral arteries are patent without significant stenosis. No M2 proximal branch occlusion or high-grade proximal stenosis is identified. The anterior cerebral arteries are patent without significant proximal stenosis.  There is a 1-2 mm vascular protrusion arising from the supraclinoid right ICA which may reflect a tiny aneurysm or infundibulum (series 15, image 92). A 1-2 mm vascular protrusion arising from the supraclinoid left ICA appears to reflect a small infundibulum (series 15, image 113). Posterior circulation: The intracranial vertebral arteries are patent without significant stenosis, as is the basilar artery. The posterior cerebral arteries are patent bilaterally without significant proximal stenosis posterior communicating arteries are hypoplastic or absent bilaterally. Venous sinuses: Within limitations of contrast timing, no convincing thrombus. Anatomic variants: As described Review of the MIP images confirms the above findings IMPRESSION: CT head: 1. No CT evidence of acute intracranial abnormality. 2. Stable mild generalized parenchymal atrophy. 3. 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 4. Tiny left maxillary sinus mucous retention cyst. CTA neck: 1. The common carotid, internal carotid and vertebral arteries are patent within the neck without hemodynamically significant stenosis. Mild mixed plaque within the right carotid bifurcation, proximal right ICA and proximal left ICA. There is also minimal calcified plaque within the visualized aortic arch. 2. 8 mm ground-glass nodule within the imaged left lung apex. Initial follow-up with CT at 6-12 months is recommended to confirm persistence. If persistent, repeat CT is recommended every 2 years until 5 years of stability has been established. This recommendation follows the consensus statement: Guidelines for Management of Incidental Pulmonary Nodules Detected on CT Images: From the Fleischner Society 2017; Radiology 2017; 284:228-243. CTA head: 1. No intracranial large vessel occlusion or proximal high-grade arterial stenosis. 2. 1-2 mm infundibulum versus tiny aneurysm arising  from the  supraclinoid right ICA. Electronically Signed   By: Kellie Simmering DO   On: 07/02/2019 12:47   CT HEAD WO CONTRAST  Result Date: 07/01/2019 CLINICAL DATA:  Headache with intracranial hemorrhage suspected. Pancytopenia. EXAM: CT HEAD WITHOUT CONTRAST TECHNIQUE: Contiguous axial images were obtained from the base of the skull through the vertex without intravenous contrast. COMPARISON:  None. FINDINGS: Brain: No evidence of acute cortical infarction, hemorrhage, hydrocephalus, extra-axial collection or mass lesion/mass effect. 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 Vascular: No hyperdense vessel or unexpected calcification. Skull: Normal. Negative for fracture or focal lesion. Sinuses/Orbits: Negative.  No visualized sinusitis. IMPRESSION: No acute finding.  Negative for intracranial hemorrhage. Electronically Signed   By: Monte Fantasia M.D.   On: 07/01/2019 05:23   CT ANGIO NECK W OR WO CONTRAST  Result Date: 07/02/2019 CLINICAL DATA:  Headache, acute, normal neuro exam. Additional provided: Headache radiates down both arms and into neck with numbness/weakness in both legs. EXAM: CT ANGIOGRAPHY HEAD AND NECK TECHNIQUE: Multidetector CT imaging of the head and neck was performed using the standard protocol during bolus administration of intravenous contrast. Multiplanar CT image reconstructions and MIPs were obtained to evaluate the vascular anatomy. Carotid stenosis measurements (when applicable) are obtained utilizing NASCET criteria, using the distal internal carotid diameter as the denominator. CONTRAST:  128mL OMNIPAQUE IOHEXOL 350 MG/ML SOLN COMPARISON:  Head CT performed 1 day prior 07/01/2019 FINDINGS: CT HEAD FINDINGS Brain: Stable, mild generalized parenchymal atrophy. 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 There is no acute intracranial hemorrhage. No demarcated  cortical infarct. No extra-axial fluid collection. No evidence of intracranial mass. No midline shift. Vascular: Reported below. Skull: Normal. Negative for fracture or focal lesion. Sinuses: Tiny left maxillary sinus mucous retention cyst. No significant mastoid effusion Orbits: No acute abnormality. Review of the MIP images confirms the above findings CTA NECK FINDINGS Aortic arch: Standard aortic branching. Mild calcified plaque within the visualized aortic arch. Streak artifact from a dense right-sided contrast bolus limits evaluation of the mid to distal right subclavian artery. No hemodynamically significant innominate or proximal subclavian artery stenosis. Right carotid system: CCA and ICA patent within the neck without significant stenosis (50% or greater). Mild mixed plaque within the carotid bifurcation and proximal ICA. Left carotid system: CCA and ICA patent within the neck without significant stenosis (50% or greater). Minimal mixed plaque within the proximal ICA. Vertebral arteries: The vertebral arteries are patent within the neck bilaterally without significant stenosis. The left vertebral artery is slightly dominant. Skeleton: No acute bony abnormality or aggressive osseous lesion. Other neck: No neck mass or cervical lymphadenopathy. Thyroid unremarkable. Upper chest: 8 mm ground-glass nodule within the left upper lobe (series 13, image 331). Review of the MIP images confirms the above findings CTA HEAD FINDINGS Anterior circulation: The intracranial internal carotid arteries are patent without significant stenosis. The M1 middle cerebral arteries are patent without significant stenosis. No M2 proximal branch occlusion or high-grade proximal stenosis is identified. The anterior cerebral arteries are patent without significant proximal stenosis. There is a 1-2 mm vascular protrusion arising from the supraclinoid right ICA which may reflect a tiny aneurysm or infundibulum (series 15, image 92). A  1-2 mm vascular protrusion arising from the supraclinoid left ICA appears to reflect a small infundibulum (series 15, image 113). Posterior circulation: The intracranial vertebral arteries are patent without significant stenosis, as is the basilar artery.  The posterior cerebral arteries are patent bilaterally without significant proximal stenosis posterior communicating arteries are hypoplastic or absent bilaterally. Venous sinuses: Within limitations of contrast timing, no convincing thrombus. Anatomic variants: As described Review of the MIP images confirms the above findings IMPRESSION: CT head: 1. No CT evidence of acute intracranial abnormality. 2. Stable mild generalized parenchymal atrophy. 3. 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 4. Tiny left maxillary sinus mucous retention cyst. CTA neck: 1. The common carotid, internal carotid and vertebral arteries are patent within the neck without hemodynamically significant stenosis. Mild mixed plaque within the right carotid bifurcation, proximal right ICA and proximal left ICA. There is also minimal calcified plaque within the visualized aortic arch. 2. 8 mm ground-glass nodule within the imaged left lung apex. Initial follow-up with CT at 6-12 months is recommended to confirm persistence. If persistent, repeat CT is recommended every 2 years until 5 years of stability has been established. This recommendation follows the consensus statement: Guidelines for Management of Incidental Pulmonary Nodules Detected on CT Images: From the Fleischner Society 2017; Radiology 2017; 284:228-243. CTA head: 1. No intracranial large vessel occlusion or proximal high-grade arterial stenosis. 2. 1-2 mm infundibulum versus tiny aneurysm arising from the supraclinoid right ICA. Electronically Signed   By: Kellie Simmering DO   On: 07/02/2019 12:47   CT ABDOMEN PELVIS W CONTRAST  Result Date:  07/29/2019 CLINICAL DATA:  History of leukemia, currently on chemotherapy, bloody diarrhea EXAM: CT ABDOMEN AND PELVIS WITH CONTRAST TECHNIQUE: Multidetector CT imaging of the abdomen and pelvis was performed using the standard protocol following bolus administration of intravenous contrast. CONTRAST:  123mL OMNIPAQUE IOHEXOL 300 MG/ML  SOLN COMPARISON:  None. FINDINGS: Lower chest: The visualized heart size within normal limits. No pericardial fluid/thickening. No hiatal hernia. Streaky atelectasis or scarring seen at the right lung base. Hepatobiliary: There is a 1.7 cm low-density lesion seen in the posterior right liver lobe which partially fills in on delayed images, likely hemangioma. Main portal vein is patent. No evidence of calcified gallstones, gallbladder wall thickening or biliary dilatation. Pancreas: Unremarkable. No pancreatic ductal dilatation or surrounding inflammatory changes. Spleen: Normal in size without focal abnormality. Adrenals/Urinary Tract: Both adrenal glands appear normal. The kidneys and collecting system appear normal without evidence of urinary tract calculus or hydronephrosis. Bladder is unremarkable. Stomach/Bowel: The patient is status post Roux-en-Y gastric bypass. The stomach and small bowel otherwise unremarkable. There appears to be mild wall thickening seen around the right colon/cecum with mild fat stranding changes. No loculated fluid collections however are noted. The appendix is unremarkable. The remainder of the colon is unremarkable. Vascular/Lymphatic: There are no enlarged mesenteric, retroperitoneal, or pelvic lymph nodes. Scattered aortic atherosclerotic calcifications are seen without aneurysmal dilatation. Reproductive: The uterus and adnexa are unremarkable. Other: No evidence of abdominal wall mass or hernia. Musculoskeletal: No acute or significant osseous findings. IMPRESSION: Findings which could be suggestive of mild colitis/typhlitis involving the right  colon and cecum. No surrounding loculated fluid collections or free air. Aortic Atherosclerosis (ICD10-I70.0). Electronically Signed   By: Prudencio Pair M.D.   On: 07/29/2019 21:28   DG Chest Port 1 View  Result Date: 07/29/2019 CLINICAL DATA:  Weakness EXAM: PORTABLE CHEST 1 VIEW COMPARISON:  06/30/2019 FINDINGS: The heart size and mediastinal contours are within normal limits. Both lungs are clear. The visualized skeletal structures are unremarkable. IMPRESSION: No active disease. Electronically Signed   By: Donavan Foil M.D.   On:  07/29/2019 16:23   DG Chest Port 1 View  Result Date: 06/30/2019 CLINICAL DATA:  Fever. EXAM: PORTABLE CHEST 1 VIEW COMPARISON:  None. FINDINGS: The heart size and mediastinal contours are within normal limits. Both lungs are clear. The visualized skeletal structures are unremarkable. IMPRESSION: No active disease. Electronically Signed   By: Constance Holster M.D.   On: 06/30/2019 23:49    ASSESSMENT AND PLAN: Kelly Roberson 59 y.o. female with medical history significant for AML s/p induction therapy and 2 cycles of consolidation. Last cycle of chemo was 07/15/2019. Now admitted secondary to GI bleeding. Had pancytopenia on admission. CT consistent with colitis/typhlitis. WBC improving. Has received 2 units PRBC and 1 unit platelets so far this admission. Continues to have anemia and thrombocytopenia. Continues to have GI bleed.    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 Elmo Putt for management of her AML.  We have been providing supportive care locally in conjunction with [facility]. --Status post second cycle of induction chemotherapy on 07/15/2019 --Received G-CSF on 07/22/2019 and WBC is slowly improving. --Has anemia and thrombocytopenia due to recent chemotherapy as well as GI bleed. --She will continue under the care of Dr. Elmo Putt at [facility] for management of her AML and we will continue to follow her closely  locally.  #Anemia secondary to recent chemotherapy and GI bleed --CT scan showed findings concerning for colitis/typhlitis. --Continue IV antibiotics. --Appreciate GI consult who does not recommend endoscopic evaluation at this time due to her thrombocytopenia. --Transfuse PRBCs to keep hemoglobin above 8. --Recommend checking hemoglobin hematocrit every 8 hours until hemoglobin stabilizes. --I have ordered 1 more unit of PRBCs this morning for hemoglobin of 6.6.  #Thrombocytopenia due to recent chemotherapy --She continues to have active GI bleeding. --Transfuse platelets for platelet count less than 20,000. --I have ordered 1 unit of platelets today for platelet count of 13,000.  #Colitis/typhlitis --Continue IV antibiotics --Appreciate GI consultation.    LOS: 1 day   Mikey Bussing, DNP, AGPCNP-BC, AOCNP 07/30/19

## 2019-07-30 NOTE — ED Notes (Signed)
IV team at bedside 

## 2019-07-31 DIAGNOSIS — K529 Noninfective gastroenteritis and colitis, unspecified: Secondary | ICD-10-CM

## 2019-07-31 DIAGNOSIS — K37 Unspecified appendicitis: Secondary | ICD-10-CM

## 2019-07-31 LAB — BPAM RBC
Blood Product Expiration Date: 202108042359
Blood Product Expiration Date: 202108112359
Blood Product Expiration Date: 202108132359
ISSUE DATE / TIME: 202107262108
ISSUE DATE / TIME: 202107270100
ISSUE DATE / TIME: 202107271658
Unit Type and Rh: 600
Unit Type and Rh: 6200
Unit Type and Rh: 6200

## 2019-07-31 LAB — BLOOD CULTURE ID PANEL (REFLEXED)

## 2019-07-31 LAB — PREPARE PLATELET PHERESIS
Unit division: 0
Unit division: 0

## 2019-07-31 LAB — COMPREHENSIVE METABOLIC PANEL
ALT: 25 U/L (ref 0–44)
AST: 24 U/L (ref 15–41)
Albumin: 2.5 g/dL — ABNORMAL LOW (ref 3.5–5.0)
Alkaline Phosphatase: 56 U/L (ref 38–126)
Anion gap: 8 (ref 5–15)
BUN: 8 mg/dL (ref 6–20)
CO2: 24 mmol/L (ref 22–32)
Calcium: 8.5 mg/dL — ABNORMAL LOW (ref 8.9–10.3)
Chloride: 105 mmol/L (ref 98–111)
Creatinine, Ser: 0.4 mg/dL — ABNORMAL LOW (ref 0.44–1.00)
GFR calc Af Amer: >60 mL/min (ref >60)
GFR calc non Af Amer: >60 mL/min (ref >60)
Glucose, Bld: 94 mg/dL (ref 70–99)
Potassium: 4.1 mmol/L (ref 3.5–5.1)
Sodium: 137 mmol/L (ref 135–145)
Total Bilirubin: 1.3 mg/dL — ABNORMAL HIGH (ref 0.3–1.2)
Total Protein: 5 g/dL — ABNORMAL LOW (ref 6.5–8.1)

## 2019-07-31 LAB — TYPE AND SCREEN
ABO/RH(D): A POS
Antibody Screen: NEGATIVE
Unit division: 0
Unit division: 0
Unit division: 0

## 2019-07-31 LAB — PHOSPHORUS: Phosphorus: 3.1 mg/dL (ref 2.5–4.6)

## 2019-07-31 LAB — CBC
HCT: 22 % — ABNORMAL LOW (ref 36.0–46.0)
Hemoglobin: 7.5 g/dL — ABNORMAL LOW (ref 12.0–15.0)
MCH: 31.4 pg (ref 26.0–34.0)
MCHC: 34.1 g/dL (ref 30.0–36.0)
MCV: 92.1 fL (ref 80.0–100.0)
Platelets: 23 10*3/uL — CL (ref 150–400)
RBC: 2.39 MIL/uL — ABNORMAL LOW (ref 3.87–5.11)
RDW: 18.6 % — ABNORMAL HIGH (ref 11.5–15.5)
WBC: 5.1 10*3/uL (ref 4.0–10.5)
nRBC: 0 % (ref 0.0–0.2)

## 2019-07-31 LAB — BPAM PLATELET PHERESIS
Blood Product Expiration Date: 202107282359
Blood Product Expiration Date: 202107282359
ISSUE DATE / TIME: 202107270520
ISSUE DATE / TIME: 202107271249
Unit Type and Rh: 5100
Unit Type and Rh: 6200

## 2019-07-31 LAB — MAGNESIUM: Magnesium: 1.8 mg/dL (ref 1.7–2.4)

## 2019-07-31 MED ORDER — BOOST / RESOURCE BREEZE PO LIQD CUSTOM
1.0000 | Freq: Three times a day (TID) | ORAL | Status: DC
  Administered 2019-07-31 – 2019-08-02 (×5): 1 via ORAL

## 2019-07-31 MED ORDER — TRAZODONE HCL 50 MG PO TABS
50.0000 mg | ORAL_TABLET | Freq: Every evening | ORAL | Status: AC | PRN
  Administered 2019-07-31: 50 mg via ORAL
  Filled 2019-07-31: qty 1

## 2019-07-31 MED ORDER — LORAZEPAM 2 MG/ML IJ SOLN
0.5000 mg | Freq: Once | INTRAMUSCULAR | Status: AC
  Administered 2019-07-31: 0.5 mg via INTRAVENOUS
  Filled 2019-07-31: qty 1

## 2019-07-31 NOTE — Progress Notes (Signed)
[facility] Progress Note  CC:  Rectal bleeding  Subjective:  Feeling better.  Bleeding is much less.  Still with several episodes of diarrhea, about 5 since midnight.  Abdominal pain is better.  Objective:  Vital signs in last 24 hours: Temp:  [97.9 F (36.6 C)-98.9 F (37.2 C)] 98.9 F (37.2 C) (07/28 0256) Pulse Rate:  [80-101] 101 (07/28 0256) Resp:  [17-25] 19 (07/28 0256) BP: (108-139)/(51-78) 125/58 (07/28 0256) SpO2:  [97 %-100 %] 97 % (07/28 0256) Last BM Date: 07/30/19 General:  Alert, Well-developed, in NAD Heart:  Regular rate and rhythm; no murmurs Pulm:  CTAB.  No increased WOB. Abdomen:  Soft, non-distended.  BS present.  Minimal lower abdominal TTP. Extremities:  Without edema. Neurologic:  Alert and oriented x 4;  grossly normal neurologically. Psych:  Alert and cooperative. Normal mood and affect.  Intake/Output from previous day: 07/27 0701 - 07/28 0700 In: 3986.7 [P.O.:835; I.V.:1360.5; Blood:1354.5; IV Piggyback:436.7] Out: -  Intake/Output this shift: Total I/O In: 720 [P.O.:720] Out: -   Lab Results: Recent Labs    07/30/19 0847 07/30/19 2157 07/31/19 0344  WBC 3.7* 5.7 5.1  HGB 6.6* 8.3* 7.5*  HCT 19.4* 24.2* 22.0*  PLT 13* 28* 23*   BMET Recent Labs    07/29/19 1449 07/30/19 0900 07/31/19 0344  NA 133* 135 137  K 2.8* 2.8* 4.1  CL 102 101 105  CO2 21* 24 24  GLUCOSE 122* 92 94  BUN 18 13 8   CREATININE 0.54 0.38* 0.40*  CALCIUM 8.0* 8.3* 8.5*   LFT Recent Labs    07/31/19 0344  PROT 5.0*  ALBUMIN 2.5*  AST 24  ALT 25  ALKPHOS 56  BILITOT 1.3*   CT ABDOMEN PELVIS W CONTRAST  Result Date: 07/29/2019 CLINICAL DATA:  History of leukemia, currently on chemotherapy, bloody diarrhea EXAM: CT ABDOMEN AND PELVIS WITH CONTRAST TECHNIQUE: Multidetector CT imaging of the abdomen and pelvis was performed using the standard protocol following bolus administration of intravenous contrast. CONTRAST:  150mL OMNIPAQUE  IOHEXOL 300 MG/ML  SOLN COMPARISON:  None. FINDINGS: Lower chest: The visualized heart size within normal limits. No pericardial fluid/thickening. No hiatal hernia. Streaky atelectasis or scarring seen at the right lung base. Hepatobiliary: There is a 1.7 cm low-density lesion seen in the posterior right liver lobe which partially fills in on delayed images, likely hemangioma. Main portal vein is patent. No evidence of calcified gallstones, gallbladder wall thickening or biliary dilatation. Pancreas: Unremarkable. No pancreatic ductal dilatation or surrounding inflammatory changes. Spleen: Normal in size without focal abnormality. Adrenals/Urinary Tract: Both adrenal glands appear normal. The kidneys and collecting system appear normal without evidence of urinary tract calculus or hydronephrosis. Bladder is unremarkable. Stomach/Bowel: The patient is status post Roux-en-Y gastric bypass. The stomach and small bowel otherwise unremarkable. There appears to be mild wall thickening seen around the right colon/cecum with mild fat stranding changes. No loculated fluid collections however are noted. The appendix is unremarkable. The remainder of the colon is unremarkable. Vascular/Lymphatic: There are no enlarged mesenteric, retroperitoneal, or pelvic lymph nodes. Scattered aortic atherosclerotic calcifications are seen without aneurysmal dilatation. Reproductive: The uterus and adnexa are unremarkable. Other: No evidence of abdominal wall mass or hernia. Musculoskeletal: No acute or significant osseous findings. IMPRESSION: Findings which could be suggestive of mild colitis/typhlitis involving the right colon and cecum. No surrounding loculated fluid collections or free air. Aortic Atherosclerosis (ICD10-I70.0). Electronically Signed   By: Prudencio Pair M.D.   On:  07/29/2019 21:28   DG Chest Port 1 View  Result Date: 07/29/2019 CLINICAL DATA:  Weakness EXAM: PORTABLE CHEST 1 VIEW COMPARISON:  06/30/2019 FINDINGS: The  heart size and mediastinal contours are within normal limits. Both lungs are clear. The visualized skeletal structures are unremarkable. IMPRESSION: No active disease. Electronically Signed   By: Donavan Foil M.D.   On: 07/29/2019 16:23   Assessment / Plan: Kelly Roberson is a 59 y.o. female PMH significant for, but not necessarily limited to,  Roux-en-Y, leukemia, IBS.                                                                                                        # Hematochezia with acute on chronic anemia / nausea / generalized lower abdominal pain / abnormal right colon on CT scan --Pain / bleeding could be secondary to typhlitis in setting of neutropenia.  WBC 0.8, abs neutrophil count 0.3. Count up to 5.1 now. --Hgb 9.0 on 07/22/19. Hematochezia since Friday and presented with hgb of 5.8, up to 6.6 post 2 uPRBC.  Now Hgb up to 7.5 grams today after  --Discussed with Hematology. Additional platelet transfusion may help with bleeding but also important should intervention be needed to control bleeding.  Platelets 23 today. --On Zosyn. --Timing for possible colonoscopy per Dr. Bryan Lemma.  Not sure how much better platelets will get.  Seems that they are starting to drift again already.   # Acute myeloid leukemia / severe pancytopenia --Undergoing chemotherapy at [facility].  --Platelets < 5, up to 13 post platelet transfusion.  Platelet count 23 today.  # History of Roux-en-Y / Nissen fundoplication  # History of Barrett's esophagus --Patient wondering if we do colonoscopy if we can do EGD at that time as well.   LOS: 2 days   Kelly Roberson. Kelly Roberson  07/31/2019, 11:24 AM

## 2019-07-31 NOTE — Progress Notes (Signed)
PHARMACY - PHYSICIAN COMMUNICATION CRITICAL VALUE ALERT - BLOOD CULTURE IDENTIFICATION (BCID)  Kelly Roberson is an 59 y.o. female who presented to [facility] on 07/29/2019 with a chief complaint of colitis  Assessment:  1/4 blood cx = + Staph species (no methicillin resistance detected) - likely contaminant  Name of physician (or Provider) Contacted: M. Sharlet Salina  Current antibiotics: Zosyn   Changes to prescribed antibiotics recommended:  Patient is on recommended antibiotics - No changes needed  Results for orders placed or performed during the hospital encounter of 07/29/19  Blood Culture ID Panel (Reflexed) (Collected: 07/29/2019  4:51 PM)  Result Value Ref Range   Enterococcus species NOT DETECTED NOT DETECTED   Listeria monocytogenes NOT DETECTED NOT DETECTED   Staphylococcus species DETECTED (A) NOT DETECTED   Staphylococcus aureus (BCID) NOT DETECTED NOT DETECTED   Methicillin resistance NOT DETECTED NOT DETECTED   Streptococcus species NOT DETECTED NOT DETECTED   Streptococcus agalactiae NOT DETECTED NOT DETECTED   Streptococcus pneumoniae NOT DETECTED NOT DETECTED   Streptococcus pyogenes NOT DETECTED NOT DETECTED   Acinetobacter baumannii NOT DETECTED NOT DETECTED   Enterobacteriaceae species NOT DETECTED NOT DETECTED   Enterobacter cloacae complex NOT DETECTED NOT DETECTED   Escherichia coli NOT DETECTED NOT DETECTED   Klebsiella oxytoca NOT DETECTED NOT DETECTED   Klebsiella pneumoniae NOT DETECTED NOT DETECTED   Proteus species NOT DETECTED NOT DETECTED   Serratia marcescens NOT DETECTED NOT DETECTED   Haemophilus influenzae NOT DETECTED NOT DETECTED   Neisseria meningitidis NOT DETECTED NOT DETECTED   Pseudomonas aeruginosa NOT DETECTED NOT DETECTED   Candida albicans NOT DETECTED NOT DETECTED   Candida glabrata NOT DETECTED NOT DETECTED   Candida krusei NOT DETECTED NOT DETECTED   Candida parapsilosis NOT DETECTED NOT DETECTED   Candida tropicalis NOT DETECTED NOT  DETECTED    Everette Rank, PharmD 07/31/2019  1:48 AM

## 2019-07-31 NOTE — Progress Notes (Signed)
I have spent much time talking with pt about why her provider did not order her Ativan & Trazodone at bedtime like she takes at home. I had to get the oncall provider to order last night as well. Pt is upset and feel the doctor is not interested in managing her pain & anxiety and that she will leave AMA tomorrow if its not taken care of. I was able to get the oncall hospitalist to provide an order for the Trazodone 1 x dose again tonight.

## 2019-07-31 NOTE — Progress Notes (Signed)
PROGRESS NOTE    Kelly Roberson  HMC:[id number] DOB: 1960-07-18 DOA: 07/29/2019 PCP: Ladell Pier, MD   Brief Narrative:  Kelly Roberson is a 59 y.o. female with medical history significant of AML currently on chemotherapy, anxiety, hypertension, IBS, history of gastric bypass surgery in 2013 presenting with complaints of bloody diarrhea and abdominal pain.  Patient reports 3-day history of multiple episodes of nonbloody diarrhea and bilateral lower quadrant abdominal pain.  She has also had nausea and dry heaves.  She is not on any blood thinners.  Reports having fever and chills 2 days ago.  States she has received blood and platelet transfusions previously as she is currently on chemotherapy.  She is being comanaged by oncology at [facility] and here in [place].  Does report a few episodes of chest discomfort and dyspnea on exertion recently.  Denies any chest pain or dyspnea at present. ED Labs showing worsening pancytopenia-WBC count 0.8 (ANC 0.3), hemoglobin 6.1, and platelet count less than 5k.  FOBT positive.  Potassium 2.8.  Lactic acid 3.6.  Lipase normal and no significant elevation of LFTs.  Blood culture x2 pending. CT abdomen pelvis showing findings suggestive of mild colitis/typhilitis involving the right colon and cecum.  No surrounding loculated fluid collections or free air.Oncology recommended blood and platelet transfusions.  Recommended against doing a Covid swab due to patient's neutropenia and thrombocytopenia. GI consulted recommended to transfuse platelets and PRBC, hold colonoscopy until platelet count and hemoglobin improves. Admitted for sepsis secondary to colitis, pancytopenia and GI bleeding.   Assessment & Plan:   Principal Problem:   Colitis Active Problems:   AML (acute myeloblastic leukemia) ([place])   Sepsis ([place])   Hypokalemia   Pancytopenia ([place])  Sepsis secondary to colitis: Patient is presenting with complaints of abdominal pain and bloody diarrhea.   She had a low-grade temperature.  She was slightly tachycardic on arrival and blood pressure soft.  Tachycardia has resolved and blood pressure improved after IV fluid bolus.  Labs showing worsening neutropenia and anemia.  FOBT positive.  Lactic acid 3.6. CT abdomen pelvis showing findings suggestive of mild colitis/typhilitis involving the right colon and cecum.  No surrounding loculated fluid collections or free air. -Continue Zosyn and IV fluid hydration.  Morphine as needed for pain.  Zofran as needed for nausea.  Trend lactic acid.  Blood culture x2 pending.  Since patient is currently hemodynamically stable. -Blood cultures: 1 bottle shows coagulase-negative staph aureus which could be contaminant. continue Zosyn for now. C. difficile PCR and GI pathogen panel have also been ordered and currently pending. -GI consulted recommended patient is not stable for colonoscopy given thrombocytopenia and anemia. Transfuse PRBC and platelet until hemoglobin and platelets are stable.  Symptomatic anemia/ pancytopenia: Likely related to chemotherapy.  Now worsened on labs - WBC count 0.8 (ANC 0.3), hemoglobin 6.1, and platelet count less than 5k.  Oncology recommended blood and platelet transfusions. -Type and screen.  3 units PRBCs and 2 unit platelets ordered in the ED.  Order posttransfusion CBC and continue to monitor closely.   -Oncology consulted recommended transfuse platelets count for less than 20,000. Hemoglobin 7.5 and platelet count 23 today.   Chest pain, dyspnea on exertion:  Resolved. Likely due to symptomatic anemia.  Currently asymptomatic.  EKG without acute ischemic changes. -Cardiac monitoring.  Check high-sensitivity troponin level.  Continue blood transfusions as mentioned above.  Hypokalemia: Likely due to GI loss from diarrhea and home diuretic use.  Potassium 2.8.  EKG without acute changes. -  Cardiac monitoring.  Replete potassium.  Check magnesium level and replete if low.   Continue to monitor electrolytes.  Hold diuretic at this time.  AML: Followed by oncology and currently on chemotherapy.   -[name] consulted recommended transfuse platelets for count less than 20,000 transfuse PRBC to keep hemoglobin above 8.  Hypertension -Hold antihypertensives at this time given concern for sepsis/soft blood pressure.   DVT prophylaxis:  SCDS Code Status: Full  Family Communication: No family at bedside. Disposition Plan:  Dispo: The patient is from: Home  Anticipated d/c is to: Home  Anticipated d/c date is: > 3 days  Patient currently is not medically stable to d/c.  Consultants:   GI, hematology  Procedures: None Antimicrobials:  Anti-infectives (From admission, onward)   Start     Dose/Rate Route Frequency Ordered Stop   07/30/19 0200  piperacillin-tazobactam (ZOSYN) IVPB 3.375 g     Discontinue     3.375 g 12.5 mL/hr over 240 Minutes Intravenous Every 8 hours 07/29/19 2339     07/29/19 1700  piperacillin-tazobactam (ZOSYN) IVPB 3.375 g        3.375 g 100 mL/hr over 30 Minutes Intravenous  Once 07/29/19 1650 07/29/19 2020      Subjective: Patient was seen and examined at bedside.  No overnight events.  She feels much better state still has blood but improving.   She has received 3 PRBCs and 2 platelet transfusion so far her platelet count is 23 and hemoglobin is 7.5.   Objective: Vitals:   07/30/19 1944 07/30/19 2259 07/31/19 0256 07/31/19 1441  BP: (!) 139/78 (!) 109/56 (!) 125/58 (!) 132/68  Pulse: 85 87 101 98  Resp: (!) 24 18 19 19   Temp: 98.3 F (36.8 C) 98.1 F (36.7 C) 98.9 F (37.2 C) 98.2 F (36.8 C)  TempSrc: Oral   Oral  SpO2: 100% 97% 97% 97%  Weight:      Height:        Intake/Output Summary (Last 24 hours) at 07/31/2019 1531 Last data filed at 07/31/2019 1454 Gross per 24 hour  Intake 2766.5 ml  Output --  Net 2766.5 ml   Filed Weights   07/29/19 1423  Weight: 90.7 kg     Examination:  General exam: Appears calm and comfortable  Respiratory system: Clear to auscultation. Respiratory effort normal. Cardiovascular system: S1 & S2 heard, RRR. No JVD, murmurs, rubs, gallops or clicks. No pedal edema. Gastrointestinal system: Abdomen is nondistended, soft and nontender. No organomegaly or masses felt. Normal bowel sounds heard. Central nervous system: Alert and oriented. No focal neurological deficits. Extremities: Symmetric 5 x 5 power. Skin: No rashes, lesions or ulcers Psychiatry: Judgement and insight appear normal. Mood & affect appropriate.     Data Reviewed: I have personally reviewed following labs and imaging studies  CBC: Recent Labs  Lab 07/29/19 1449 07/30/19 0029 07/30/19 0847 07/30/19 2157 07/31/19 0344  WBC 0.8* 2.0* 3.7* 5.7 5.1  NEUTROABS 0.3*  --  2.1 3.5  --   HGB 6.1* 5.8* 6.6* 8.3* 7.5*  HCT 18.6* 17.4* 19.4* 24.2* 22.0*  MCV 100.0 97.8 93.3 91.7 92.1  PLT <5* <5* 13* 28* 23*   Basic Metabolic Panel: Recent Labs  Lab 07/29/19 1449 07/30/19 0029 07/30/19 0900 07/31/19 0344  NA 133*  --  135 137  K 2.8*  --  2.8* 4.1  CL 102  --  101 105  CO2 21*  --  24 24  GLUCOSE 122*  --  92  94  BUN 18  --  13 8  CREATININE 0.54  --  0.38* 0.40*  CALCIUM 8.0*  --  8.3* 8.5*  MG  --  1.5* 2.0 1.8  PHOS  --   --  3.5 3.1   GFR: Estimated Creatinine Clearance: 88.1 mL/min (A) (by C-G formula based on SCr of 0.4 mg/dL (L)). Liver Function Tests: Recent Labs  Lab 07/29/19 1449 07/30/19 0900 07/31/19 0344  AST 44* 37 24  ALT 35 32 25  ALKPHOS 61 62 56  BILITOT 1.0 3.0* 1.3*  PROT 5.3* 5.8* 5.0*  ALBUMIN 2.6* 2.9* 2.5*   Recent Labs  Lab 07/29/19 1449  LIPASE 14   No results for input(s): AMMONIA in the last 168 hours. Coagulation Profile: No results for input(s): INR, PROTIME in the last 168 hours. Cardiac Enzymes: No results for input(s): CKTOTAL, CKMB, CKMBINDEX, TROPONINI in the last 168 hours. BNP (last 3  results) No results for input(s): PROBNP in the last 8760 hours. HbA1C: No results for input(s): HGBA1C in the last 72 hours. CBG: No results for input(s): GLUCAP in the last 168 hours. Lipid Profile: No results for input(s): CHOL, HDL, LDLCALC, TRIG, CHOLHDL, LDLDIRECT in the last 72 hours. Thyroid Function Tests: No results for input(s): TSH, T4TOTAL, FREET4, T3FREE, THYROIDAB in the last 72 hours. Anemia Panel: No results for input(s): VITAMINB12, FOLATE, FERRITIN, TIBC, IRON, RETICCTPCT in the last 72 hours. Sepsis Labs: Recent Labs  Lab 07/29/19 1449 07/30/19 0029  LATICACIDVEN 3.6* 1.5    Recent Results (from the past 240 hour(s))  Blood culture (routine x 2)     Status: Abnormal (Preliminary result)   Collection Time: 07/29/19  4:51 PM   Specimen: BLOOD  Result Value Ref Range Status   Specimen Description   Final    BLOOD RIGHT ANTECUBITAL Performed at [facility], [place] [address] [place], [place] [address]    Special Requests   Final    BOTTLES DRAWN AEROBIC AND ANAEROBIC Blood Culture adequate volume Performed at [place] [address], [place], [place] [address]    Culture  Setup Time   Final    GRAM POSITIVE COCCI IN CLUSTERS AEROBIC BOTTLE ONLY Organism ID to follow CRITICAL RESULT CALLED TO, READ BACK BY AND VERIFIED WITH: PHARMD LIANNE POINDEXTER BY [place] ON 07/31/2019 AT 0100 Performed at [facility], [place] [address], [place], [place] [address]    Culture STAPHYLOCOCCUS SPECIES (COAGULASE NEGATIVE) (A)  Final   Report Status PENDING  Incomplete  Blood Culture ID Panel (Reflexed)     Status: Abnormal   Collection Time: 07/29/19  4:51 PM  Result Value Ref Range Status   Enterococcus species NOT DETECTED NOT DETECTED Final   Listeria monocytogenes NOT DETECTED NOT DETECTED Final   Staphylococcus species DETECTED (A) NOT DETECTED Final    Comment: Methicillin (oxacillin) susceptible coagulase negative  staphylococcus. Possible blood culture contaminant (unless isolated from more than one blood culture draw or clinical case suggests pathogenicity). No antibiotic treatment is indicated for blood  culture contaminants. CRITICAL RESULT CALLED TO, READ BACK BY AND VERIFIED WITH: PHARMD LIANNE POINDEXTER BY [place] ON 07/31/2019 AT 0100    Staphylococcus aureus (BCID) NOT DETECTED NOT DETECTED Final   Methicillin resistance NOT DETECTED NOT DETECTED Final   Streptococcus species NOT DETECTED NOT DETECTED Final   Streptococcus agalactiae NOT DETECTED NOT DETECTED Final   Streptococcus pneumoniae NOT DETECTED NOT DETECTED Final   Streptococcus pyogenes NOT DETECTED  NOT DETECTED Final   Acinetobacter baumannii NOT DETECTED NOT DETECTED Final   Enterobacteriaceae species NOT DETECTED NOT DETECTED Final   Enterobacter cloacae complex NOT DETECTED NOT DETECTED Final   Escherichia coli NOT DETECTED NOT DETECTED Final   Klebsiella oxytoca NOT DETECTED NOT DETECTED Final   Klebsiella pneumoniae NOT DETECTED NOT DETECTED Final   Proteus species NOT DETECTED NOT DETECTED Final   Serratia marcescens NOT DETECTED NOT DETECTED Final   Haemophilus influenzae NOT DETECTED NOT DETECTED Final   Neisseria meningitidis NOT DETECTED NOT DETECTED Final   Pseudomonas aeruginosa NOT DETECTED NOT DETECTED Final   Candida albicans NOT DETECTED NOT DETECTED Final   Candida glabrata NOT DETECTED NOT DETECTED Final   Candida krusei NOT DETECTED NOT DETECTED Final   Candida parapsilosis NOT DETECTED NOT DETECTED Final   Candida tropicalis NOT DETECTED NOT DETECTED Final    Comment: Performed at [facility], [place] [address], [place], [place] [address]  SARS Coronavirus 2 by RT PCR (hospital order, performed in [facility] hospital lab) Nasopharyngeal Nasopharyngeal Swab     Status: None   Collection Time: 07/30/19  8:51 AM   Specimen: Nasopharyngeal Swab  Result Value Ref Range Status   SARS Coronavirus  2 NEGATIVE NEGATIVE Final    Comment: (NOTE) SARS-CoV-2 target nucleic acids are NOT DETECTED.  The SARS-CoV-2 RNA is generally detectable in upper and lower respiratory specimens during the acute phase of infection. The lowest concentration of SARS-CoV-2 viral copies this assay can detect is 250 copies / mL. A negative result does not preclude SARS-CoV-2 infection and should not be used as the sole basis for treatment or other patient management decisions.  A negative result may occur with improper specimen collection / handling, submission of specimen other than nasopharyngeal swab, presence of viral mutation(s) within the areas targeted by this assay, and inadequate number of viral copies (<250 copies / mL). A negative result must be combined with clinical observations, patient history, and epidemiological information.  Fact Sheet for Patients:   StrictlyIdeas.no  Fact Sheet for Healthcare Providers: BankingDealers.co.za  This test is not yet approved or  cleared by the [place] FDA and has been authorized for detection and/or diagnosis of SARS-CoV-2 by FDA under an Emergency Use Authorization (EUA).  This EUA will remain in effect (meaning this test can be used) for the duration of the COVID-19 declaration under Section 564(b)(1) of the Act, 21 U.S.C. section 360bbb-3(b)(1), unless the authorization is terminated or revoked sooner.  Performed at [facility], [place] [address], [place], [place] [address]     Radiology Studies: CT ABDOMEN PELVIS W CONTRAST  Result Date: 07/29/2019 CLINICAL DATA:  History of leukemia, currently on chemotherapy, bloody diarrhea EXAM: CT ABDOMEN AND PELVIS WITH CONTRAST TECHNIQUE: Multidetector CT imaging of the abdomen and pelvis was performed using the standard protocol following bolus administration of intravenous contrast. CONTRAST:  167mL OMNIPAQUE IOHEXOL 300 MG/ML  SOLN  COMPARISON:  None. FINDINGS: Lower chest: The visualized heart size within normal limits. No pericardial fluid/thickening. No hiatal hernia. Streaky atelectasis or scarring seen at the right lung base. Hepatobiliary: There is a 1.7 cm low-density lesion seen in the posterior right liver lobe which partially fills in on delayed images, likely hemangioma. Main portal vein is patent. No evidence of calcified gallstones, gallbladder wall thickening or biliary dilatation. Pancreas: Unremarkable. No pancreatic ductal dilatation or surrounding inflammatory changes. Spleen: Normal in size without focal abnormality. Adrenals/Urinary Tract: Both adrenal glands appear normal. The  kidneys and collecting system appear normal without evidence of urinary tract calculus or hydronephrosis. Bladder is unremarkable. Stomach/Bowel: The patient is status post Roux-en-Y gastric bypass. The stomach and small bowel otherwise unremarkable. There appears to be mild wall thickening seen around the right colon/cecum with mild fat stranding changes. No loculated fluid collections however are noted. The appendix is unremarkable. The remainder of the colon is unremarkable. Vascular/Lymphatic: There are no enlarged mesenteric, retroperitoneal, or pelvic lymph nodes. Scattered aortic atherosclerotic calcifications are seen without aneurysmal dilatation. Reproductive: The uterus and adnexa are unremarkable. Other: No evidence of abdominal wall mass or hernia. Musculoskeletal: No acute or significant osseous findings. IMPRESSION: Findings which could be suggestive of mild colitis/typhlitis involving the right colon and cecum. No surrounding loculated fluid collections or free air. Aortic Atherosclerosis (ICD10-I70.0). Electronically Signed   By: Prudencio Pair M.D.   On: 07/29/2019 21:28   Scheduled Meds: . sodium chloride   Intravenous Once  . feeding supplement  1 Container Oral TID BM  . potassium chloride  40 mEq Oral Once  . sodium chloride  flush  10-40 mL Intracatheter Q12H   Continuous Infusions: . piperacillin-tazobactam (ZOSYN)  IV 3.375 g (07/31/19 1057)     LOS: 2 days    Time spent:  25 mins   Jaila Schellhorn, MD [facility]   If 7PM-7AM, please contact night-coverage

## 2019-07-31 NOTE — Progress Notes (Addendum)
Initial Nutrition Assessment  INTERVENTION:   -Boost Breeze po TID, each supplement provides 250 kcal and 9 grams of protein while on clear liquids  -Once diet advanced, recommend daily snacks -Multivitamin with minerals daily  NUTRITION DIAGNOSIS:   Increased nutrient needs related to cancer and cancer related treatments as evidenced by estimated needs.  GOAL:   Patient will meet greater than or equal to 90% of their needs  MONITOR:   PO intake, Supplement acceptance, Labs, Weight trends, I & O's  REASON FOR ASSESSMENT:   Malnutrition Screening Tool    ASSESSMENT:   59 y.o. 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  Unfortunately the patient has been found to have an infectious colitis/typhlitis in the setting of neutropenia. Admitted for sepsis secondary to colitis.  Pt has been receiving chemotherapy treatment at [facility]. Last chemo 7/12. Pt with history of gastric bypass surgery as well in 2013.   Patient on clear liquids. Per GI, recommend pt have colonoscopy once thrombocytopenia and neutropenia resolve.  Will order Boost Breeze while on clears. Pt doesn't like protein supplements so will order daily snacks once diet is advanced.  Per weight records, pt has lost 13 lbs since 4/27 (6% wt loss x 3 months, insignificant for time frame).   Labs reviewed. Medications reviewed.  NUTRITION - FOCUSED PHYSICAL EXAM:  Unable to complete  Diet Order:   Diet Order            Diet clear liquid Room service appropriate? Yes; Fluid consistency: Thin  Diet effective now                 EDUCATION NEEDS:   No education needs have been identified at this time  Skin:  Skin Assessment: Reviewed RN Assessment  Last BM:  7/28 -type 6  Height:   Ht Readings from Last 1 Encounters:  07/29/19 5\' 8"  (1.727 m)    Weight:   Wt  Readings from Last 1 Encounters:  07/29/19 90.7 kg    BMI:  Body mass index is 30.41 kg/m.  Estimated Nutritional Needs:   Kcal:  2000-2200  Protein:  90-100g  Fluid:  2L/day  Clayton Bibles, MS, RD, LDN Inpatient Clinical Dietitian Contact information available via Amion

## 2019-08-01 ENCOUNTER — Inpatient Hospital Stay: Payer: Medicaid Other

## 2019-08-01 DIAGNOSIS — R1031 Right lower quadrant pain: Secondary | ICD-10-CM

## 2019-08-01 DIAGNOSIS — R935 Abnormal findings on diagnostic imaging of other abdominal regions, including retroperitoneum: Secondary | ICD-10-CM

## 2019-08-01 DIAGNOSIS — K36 Other appendicitis: Secondary | ICD-10-CM

## 2019-08-01 LAB — COMPREHENSIVE METABOLIC PANEL
ALT: 22 U/L (ref 0–44)
AST: 21 U/L (ref 15–41)
Albumin: 2.8 g/dL — ABNORMAL LOW (ref 3.5–5.0)
Alkaline Phosphatase: 68 U/L (ref 38–126)
Anion gap: 7 (ref 5–15)
BUN: <5 mg/dL — ABNORMAL LOW (ref 6–20)
CO2: 25 mmol/L (ref 22–32)
Calcium: 8.8 mg/dL — ABNORMAL LOW (ref 8.9–10.3)
Chloride: 104 mmol/L (ref 98–111)
Creatinine, Ser: 0.48 mg/dL (ref 0.44–1.00)
GFR calc Af Amer: >60 mL/min (ref >60)
GFR calc non Af Amer: >60 mL/min (ref >60)
Glucose, Bld: 94 mg/dL (ref 70–99)
Potassium: 3.9 mmol/L (ref 3.5–5.1)
Sodium: 136 mmol/L (ref 135–145)
Total Bilirubin: 1.3 mg/dL — ABNORMAL HIGH (ref 0.3–1.2)
Total Protein: 5.3 g/dL — ABNORMAL LOW (ref 6.5–8.1)

## 2019-08-01 LAB — CBC
HCT: 23.4 % — ABNORMAL LOW (ref 36.0–46.0)
Hemoglobin: 7.7 g/dL — ABNORMAL LOW (ref 12.0–15.0)
MCH: 31 pg (ref 26.0–34.0)
MCHC: 32.9 g/dL (ref 30.0–36.0)
MCV: 94.4 fL (ref 80.0–100.0)
Platelets: 28 10*3/uL — CL (ref 150–400)
RBC: 2.48 MIL/uL — ABNORMAL LOW (ref 3.87–5.11)
RDW: 18.3 % — ABNORMAL HIGH (ref 11.5–15.5)
WBC: 5.7 10*3/uL (ref 4.0–10.5)
nRBC: 0 % (ref 0.0–0.2)

## 2019-08-01 LAB — CULTURE, BLOOD (ROUTINE X 2): Special Requests: ADEQUATE

## 2019-08-01 LAB — MAGNESIUM: Magnesium: 1.7 mg/dL (ref 1.7–2.4)

## 2019-08-01 LAB — PHOSPHORUS: Phosphorus: 3.5 mg/dL (ref 2.5–4.6)

## 2019-08-01 MED ORDER — METRONIDAZOLE 500 MG PO TABS
500.0000 mg | ORAL_TABLET | Freq: Three times a day (TID) | ORAL | Status: DC
  Administered 2019-08-01 – 2019-08-03 (×6): 500 mg via ORAL
  Filled 2019-08-01 (×6): qty 1

## 2019-08-01 MED ORDER — CIPROFLOXACIN HCL 500 MG PO TABS
750.0000 mg | ORAL_TABLET | Freq: Two times a day (BID) | ORAL | Status: DC
  Administered 2019-08-01 – 2019-08-03 (×5): 750 mg via ORAL
  Filled 2019-08-01 (×5): qty 2

## 2019-08-01 NOTE — Progress Notes (Signed)
Pt was c/o being so cold but was under multiple blankets, Oral temp =101.7. Followed up with complete set of VS after removing a blanket and temp was 99.7

## 2019-08-01 NOTE — Progress Notes (Signed)
PROGRESS NOTE    Kelly Roberson  YHC:[id number] DOB: 1960/07/05 DOA: 07/29/2019 PCP: Ladell Pier, MD   Brief Narrative:  Ryelee Albee is a 59 y.o. female with medical history significant of AML currently on chemotherapy, anxiety, hypertension, IBS, history of gastric bypass surgery in 2013 presenting with complaints of bloody diarrhea and abdominal pain.  Patient reports 3-day history of multiple episodes of nonbloody diarrhea and bilateral lower quadrant abdominal pain.  She has also had nausea and dry heaves.  She is not on any blood thinners.  Reports having fever and chills 2 days ago.  States she has received blood and platelet transfusions previously as she is currently on chemotherapy.  She is being comanaged by oncology at [facility] and here in [place].  Does report a few episodes of chest discomfort and dyspnea on exertion recently.  Denies any chest pain or dyspnea at present. ED Labs showing worsening pancytopenia-WBC count 0.8 (ANC 0.3), hemoglobin 6.1, and platelet count less than 5k.  FOBT positive.  Potassium 2.8.  Lactic acid 3.6.  Lipase normal and no significant elevation of LFTs.  Blood culture x2 pending. CT abdomen pelvis showing findings suggestive of mild colitis/typhilitis involving the right colon and cecum.  No surrounding loculated fluid collections or free air.Oncology recommended blood and platelet transfusions.  Recommended against doing a Covid swab due to patient's neutropenia and thrombocytopenia. GI consulted recommended to transfuse platelets and PRBC, hold colonoscopy until platelet count and hemoglobin improves. Admitted for sepsis secondary to colitis, pancytopenia and GI bleeding.  White cell count is improving, antibiotics switched to ciprofloxacin and Flagyl, GI recommended no need for colonoscopy at this point.   Assessment & Plan:   Principal Problem:   Colitis Active Problems:   AML (acute myeloblastic leukemia) ([place])   Sepsis ([place])    Hypokalemia   Pancytopenia ([place])   Typhlitis  Sepsis secondary to colitis: Patient is presenting with complaints of abdominal pain and bloody diarrhea.  She had a low-grade temperature.  She was slightly tachycardic on arrival and blood pressure soft.  Tachycardia has resolved and blood pressure improved after IV fluid bolus.  Labs showing worsening neutropenia and anemia.  FOBT positive.  Lactic acid 3.6. CT abdomen pelvis showing findings suggestive of mild colitis/typhilitis involving the right colon and cecum.  No surrounding loculated fluid collections or free air. -Continue Zosyn and IV fluid hydration.  Morphine as needed for pain.  Zofran as needed for nausea.  Trend lactic acid.  Blood culture x2 pending.  Since patient is currently hemodynamically stable. -Blood cultures: 1 bottle shows coagulase-negative staph aureus which could be contaminant. continue Zosyn for now. C. difficile PCR and GI pathogen panel have also been ordered and currently pending. -GI consulted recommended patient is not stable for colonoscopy given thrombocytopenia and anemia. Transfuse PRBC and platelet until hemoglobin and platelets are stable. White cell count is improving, antibiotic changed to ciprofloxacin and Flagyl.  Symptomatic anemia/ pancytopenia: Likely related to chemotherapy.  Now worsened on labs - WBC count 0.8 (ANC 0.3), hemoglobin 6.1, and platelet count less than 5k.  Oncology recommended blood and platelet transfusions. -Type and screen.  3 units PRBCs and 2 unit platelets ordered in the ED.  Order posttransfusion CBC and continue to monitor closely.   -Oncology consulted recommended transfuse platelets count for less than 20,000. Hemoglobin 7.5 and platelet count 23 today. Oncology recommended keep hemoglobin above 8. GI recommended no plans for colonoscopy since hemoglobin is stable, denies any further GI bleeding.  Chest pain, dyspnea on exertion:  Resolved. Likely due to symptomatic  anemia.  Currently asymptomatic.  EKG without acute ischemic changes. -Cardiac monitoring.  Check high-sensitivity troponin level.  Continue blood transfusions as mentioned above.  Hypokalemia: Resolved. Likely due to GI loss from diarrhea and home diuretic use.  Potassium 2.8.  EKG without acute changes. -Cardiac monitoring.  Replete potassium.  Check magnesium level and replete if low.  Continue to monitor electrolytes.  Hold diuretic at this time.  AML: Followed by oncology and currently on chemotherapy.   -[name] consulted recommended transfuse platelets for count less than 20,000 transfuse PRBC to keep hemoglobin above 8.  Hypertension -Hold antihypertensives at this time given concern for sepsis/soft blood pressure.   DVT prophylaxis:  SCDS Code Status: Full  Family Communication: No family at bedside. Disposition Plan:  Dispo: The patient is from: Home  Anticipated d/c is to: Home  Anticipated d/c date is:7/30.  Patient currently is not medically stable to d/c.  Ongoing work-up  Consultants:   GI, hematology  Procedures: None Antimicrobials:  Anti-infectives (From admission, onward)   Start     Dose/Rate Route Frequency Ordered Stop   08/01/19 1000  ciprofloxacin (CIPRO) tablet 750 mg     Discontinue     750 mg Oral 2 times daily 08/01/19 0929     08/01/19 0945  metroNIDAZOLE (FLAGYL) tablet 500 mg     Discontinue     500 mg Oral Every 8 hours 08/01/19 0929     07/30/19 0200  piperacillin-tazobactam (ZOSYN) IVPB 3.375 g  Status:  Discontinued        3.375 g 12.5 mL/hr over 240 Minutes Intravenous Every 8 hours 07/29/19 2339 08/01/19 0929   07/29/19 1700  piperacillin-tazobactam (ZOSYN) IVPB 3.375 g        3.375 g 100 mL/hr over 30 Minutes Intravenous  Once 07/29/19 1650 07/29/19 2020      Subjective: Patient was seen and examined at bedside.  No overnight events.  She feels much better, denies any further GI bleeding.   Stools are slightly brownish.  She has received 3 PRBCs and 2 platelet transfusion so far her platelet count is 23 and hemoglobin is 7.5.   Objective: Vitals:   07/31/19 2005 08/01/19 0422 08/01/19 0601 08/01/19 1405  BP: (!) 136/73 (!) 137/86 (!) 135/76 114/70  Pulse: 102 101 105 89  Resp: 18 19 19 17   Temp: 98.2 F (36.8 C) 98 F (36.7 C) 99.1 F (37.3 C) 98.2 F (36.8 C)  TempSrc: Oral Oral Oral Oral  SpO2: 99% 99% 98% 95%  Weight:      Height:        Intake/Output Summary (Last 24 hours) at 08/01/2019 1433 Last data filed at 07/31/2019 2005 Gross per 24 hour  Intake 480 ml  Output --  Net 480 ml   Filed Weights   07/29/19 1423  Weight: 90.7 kg    Examination:  General exam: Appears calm and comfortable  Respiratory system: Clear to auscultation. Respiratory effort normal. Cardiovascular system: S1 & S2 heard, RRR. No JVD, murmurs, rubs, gallops or clicks. No pedal edema. Gastrointestinal system: Abdomen is nondistended, soft and nontender. No organomegaly or masses felt. Normal bowel sounds heard. Central nervous system: Alert and oriented. No focal neurological deficits. Extremities: Symmetric 5 x 5 power. Skin: No rashes, lesions or ulcers Psychiatry: Judgement and insight appear normal. Mood & affect appropriate.     Data Reviewed: I have personally reviewed following labs and imaging studies  CBC: Recent Labs  Lab 07/29/19 1449 07/29/19 1449 07/30/19 0029 07/30/19 0847 07/30/19 2157 07/31/19 0344 08/01/19 0420  WBC 0.8*   < > 2.0* 3.7* 5.7 5.1 5.7  NEUTROABS 0.3*  --   --  2.1 3.5  --   --   HGB 6.1*   < > 5.8* 6.6* 8.3* 7.5* 7.7*  HCT 18.6*   < > 17.4* 19.4* 24.2* 22.0* 23.4*  MCV 100.0   < > 97.8 93.3 91.7 92.1 94.4  PLT <5*   < > <5* 13* 28* 23* 28*   < > = values in this interval not displayed.   Basic Metabolic Panel: Recent Labs  Lab 07/29/19 1449 07/30/19 0029 07/30/19 0900 07/31/19 0344 08/01/19 0420  NA 133*  --  135 137 136  K  2.8*  --  2.8* 4.1 3.9  CL 102  --  101 105 104  CO2 21*  --  24 24 25   GLUCOSE 122*  --  92 94 94  BUN 18  --  13 8 <5*  CREATININE 0.54  --  0.38* 0.40* 0.48  CALCIUM 8.0*  --  8.3* 8.5* 8.8*  MG  --  1.5* 2.0 1.8 1.7  PHOS  --   --  3.5 3.1 3.5   GFR: Estimated Creatinine Clearance: 88.1 mL/min (by C-G formula based on SCr of 0.48 mg/dL). Liver Function Tests: Recent Labs  Lab 07/29/19 1449 07/30/19 0900 07/31/19 0344 08/01/19 0420  AST 44* 37 24 21  ALT 35 32 25 22  ALKPHOS 61 62 56 68  BILITOT 1.0 3.0* 1.3* 1.3*  PROT 5.3* 5.8* 5.0* 5.3*  ALBUMIN 2.6* 2.9* 2.5* 2.8*   Recent Labs  Lab 07/29/19 1449  LIPASE 14   No results for input(s): AMMONIA in the last 168 hours. Coagulation Profile: No results for input(s): INR, PROTIME in the last 168 hours. Cardiac Enzymes: No results for input(s): CKTOTAL, CKMB, CKMBINDEX, TROPONINI in the last 168 hours. BNP (last 3 results) No results for input(s): PROBNP in the last 8760 hours. HbA1C: No results for input(s): HGBA1C in the last 72 hours. CBG: No results for input(s): GLUCAP in the last 168 hours. Lipid Profile: No results for input(s): CHOL, HDL, LDLCALC, TRIG, CHOLHDL, LDLDIRECT in the last 72 hours. Thyroid Function Tests: No results for input(s): TSH, T4TOTAL, FREET4, T3FREE, THYROIDAB in the last 72 hours. Anemia Panel: No results for input(s): VITAMINB12, FOLATE, FERRITIN, TIBC, IRON, RETICCTPCT in the last 72 hours. Sepsis Labs: Recent Labs  Lab 07/29/19 1449 07/30/19 0029  LATICACIDVEN 3.6* 1.5    Recent Results (from the past 240 hour(s))  Blood culture (routine x 2)     Status: Abnormal   Collection Time: 07/29/19  4:51 PM   Specimen: BLOOD  Result Value Ref Range Status   Specimen Description   Final    BLOOD RIGHT ANTECUBITAL Performed at [facility], [place] [address], [place], [place] [address]    Special Requests   Final    BOTTLES DRAWN AEROBIC AND ANAEROBIC Blood Culture adequate  volume Performed at [facility] [address] [place], [place] [address]    Culture  Setup Time   Final    GRAM POSITIVE COCCI IN CLUSTERS AEROBIC BOTTLE ONLY Organism ID to follow CRITICAL RESULT CALLED TO, READ BACK BY AND VERIFIED WITH: PHARMD LIANNE POINDEXTER BY [place] ON 07/31/2019 AT 0100    Culture (A)  Final    STAPHYLOCOCCUS SPECIES (COAGULASE NEGATIVE) THE SIGNIFICANCE  OF ISOLATING THIS ORGANISM FROM A SINGLE SET OF BLOOD CULTURES WHEN MULTIPLE SETS ARE DRAWN IS UNCERTAIN. PLEASE NOTIFY THE MICROBIOLOGY DEPARTMENT WITHIN ONE WEEK IF SPECIATION AND SENSITIVITIES ARE REQUIRED. Performed at [facility], [place] [address] [place], [place] [address]    Report Status 08/01/2019 FINAL  Final  Blood Culture ID Panel (Reflexed)     Status: Abnormal   Collection Time: 07/29/19  4:51 PM  Result Value Ref Range Status   Enterococcus species NOT DETECTED NOT DETECTED Final   Listeria monocytogenes NOT DETECTED NOT DETECTED Final   Staphylococcus species DETECTED (A) NOT DETECTED Final    Comment: Methicillin (oxacillin) susceptible coagulase negative staphylococcus. Possible blood culture contaminant (unless isolated from more than one blood culture draw or clinical case suggests pathogenicity). No antibiotic treatment is indicated for blood  culture contaminants. CRITICAL RESULT CALLED TO, READ BACK BY AND VERIFIED WITH: PHARMD LIANNE POINDEXTER BY [place] ON 07/31/2019 AT 0100    Staphylococcus aureus (BCID) NOT DETECTED NOT DETECTED Final   Methicillin resistance NOT DETECTED NOT DETECTED Final   Streptococcus species NOT DETECTED NOT DETECTED Final   Streptococcus agalactiae NOT DETECTED NOT DETECTED Final   Streptococcus pneumoniae NOT DETECTED NOT DETECTED Final   Streptococcus pyogenes NOT DETECTED NOT DETECTED Final   Acinetobacter baumannii NOT DETECTED NOT DETECTED Final   Enterobacteriaceae species NOT DETECTED NOT DETECTED  Final   Enterobacter cloacae complex NOT DETECTED NOT DETECTED Final   Escherichia coli NOT DETECTED NOT DETECTED Final   Klebsiella oxytoca NOT DETECTED NOT DETECTED Final   Klebsiella pneumoniae NOT DETECTED NOT DETECTED Final   Proteus species NOT DETECTED NOT DETECTED Final   Serratia marcescens NOT DETECTED NOT DETECTED Final   Haemophilus influenzae NOT DETECTED NOT DETECTED Final   Neisseria meningitidis NOT DETECTED NOT DETECTED Final   Pseudomonas aeruginosa NOT DETECTED NOT DETECTED Final   Candida albicans NOT DETECTED NOT DETECTED Final   Candida glabrata NOT DETECTED NOT DETECTED Final   Candida krusei NOT DETECTED NOT DETECTED Final   Candida parapsilosis NOT DETECTED NOT DETECTED Final   Candida tropicalis NOT DETECTED NOT DETECTED Final    Comment: Performed at [facility], [place]. [address], [place], [place] [address]  Blood culture (routine x 2)     Status: None (Preliminary result)   Collection Time: 07/30/19 12:29 AM   Specimen: BLOOD  Result Value Ref Range Status   Specimen Description   Final    BLOOD LEFT ANTECUBITAL Performed at [facility] [address], [place], [place] [address]    Special Requests   Final    BOTTLES DRAWN AEROBIC AND ANAEROBIC Blood Culture adequate volume Performed at [facility] [address], [place], [place] [address]    Culture   Final    NO GROWTH 2 DAYS Performed at [facility] [address], [place], [place] [address]    Report Status PENDING  Incomplete  SARS Coronavirus 2 by RT PCR (hospital order, performed in [facility] hospital lab) Nasopharyngeal Nasopharyngeal Swab     Status: None   Collection Time: 07/30/19  8:51 AM   Specimen: Nasopharyngeal Swab  Result Value Ref Range Status   SARS Coronavirus 2 NEGATIVE NEGATIVE Final    Comment: (NOTE) SARS-CoV-2 target nucleic acids are NOT DETECTED.  The SARS-CoV-2 RNA is generally detectable in upper and  lower respiratory specimens during the acute phase of infection. The lowest concentration of SARS-CoV-2 viral copies this  assay can detect is 250 copies / mL. A negative result does not preclude SARS-CoV-2 infection and should not be used as the sole basis for treatment or other patient management decisions.  A negative result may occur with improper specimen collection / handling, submission of specimen other than nasopharyngeal swab, presence of viral mutation(s) within the areas targeted by this assay, and inadequate number of viral copies (<250 copies / mL). A negative result must be combined with clinical observations, patient history, and epidemiological information.  Fact Sheet for Patients:   StrictlyIdeas.no  Fact Sheet for Healthcare Providers: BankingDealers.co.za  This test is not yet approved or  cleared by the [place] FDA and has been authorized for detection and/or diagnosis of SARS-CoV-2 by FDA under an Emergency Use Authorization (EUA).  This EUA will remain in effect (meaning this test can be used) for the duration of the COVID-19 declaration under Section 564(b)(1) of the Act, 21 U.S.C. section 360bbb-3(b)(1), unless the authorization is terminated or revoked sooner.  Performed at [facility], [place] [address], [place], [place] [address]     Radiology Studies: No results found. Scheduled Meds: . ciprofloxacin  750 mg Oral BID  . feeding supplement  1 Container Oral TID BM  . metroNIDAZOLE  500 mg Oral Q8H  . potassium chloride  40 mEq Oral Once  . sodium chloride flush  10-40 mL Intracatheter Q12H   Continuous Infusions:    LOS: 3 days    Time spent:  25 mins   Shawna Clamp, MD [facility]   If 7PM-7AM, please contact night-coverage

## 2019-08-01 NOTE — Progress Notes (Signed)
HEMATOLOGY-ONCOLOGY PROGRESS NOTE  SUBJECTIVE: Reports that she is feeling better this morning.  Wants to eat.  Reports mild abdominal discomfort but improved.  Bleeding improved.  No recurrent fever.  REVIEW OF SYSTEMS:   Constitutional: No fevers or chills Eyes: Denies blurriness of vision Ears, nose, mouth, throat, and face: Denies mucositis or sore throat Respiratory: Denies cough, dyspnea or wheezes Cardiovascular: Denies palpitation, chest discomfort Gastrointestinal: Still with bloody diarrhea, but less blood noted and less frequent stools Skin: Bruises easily.Denies abnormal skin rashes Lymphatics: Denies new lymphadenopathy Neurological:Denies numbness, tingling or new weaknesses Behavioral/Psych: Mood is stable, no new changes  Extremities: No lower extremity edema All other systems were reviewed with the patient and are negative.  I have reviewed the past medical history, past surgical history, social history and family history with the patient and they are unchanged from previous note.   PHYSICAL EXAMINATION: ECOG PERFORMANCE STATUS: 1 - Symptomatic but completely ambulatory  Vitals:   08/01/19 0422 08/01/19 0601  BP: (!) 137/86 (!) 135/76  Pulse: 101 105  Resp: 19 19  Temp: 98 F (36.7 C) 99.1 F (37.3 C)  SpO2: 99% 98%   Filed Weights   07/29/19 1423  Weight: 90.7 kg    Intake/Output from previous day: 07/28 0701 - 07/29 0700 In: 1200 [P.O.:1200] Out: -   GENERAL:alert, no distress and comfortable SKIN: Multiple ecchymoses to arms and one below left eye EYES: normal, Conjunctiva are pink and non-injected, sclera clear OROPHARYNX:no exudate, no erythema and lips, buccal mucosa, and tongue normal  LUNGS: clear to auscultation and percussion with normal breathing effort HEART: regular rate & rhythm and no murmurs and no lower extremity edema ABDOMEN:abdomen soft, non-tender and normal bowel sounds NEURO: alert & oriented x 3 with fluent speech, no focal  motor/sensory deficits  LABORATORY DATA:  I have reviewed the data as listed CMP Latest Ref Rng & Units 08/01/2019 07/31/2019 07/30/2019  Glucose 70 - 99 mg/dL 94 94 92  BUN 6 - 20 mg/dL <5(L) 8 13  Creatinine 0.44 - 1.00 mg/dL 0.48 0.40(L) 0.38(L)  Sodium 135 - 145 mmol/L 136 137 135  Potassium 3.5 - 5.1 mmol/L 3.9 4.1 2.8(L)  Chloride 98 - 111 mmol/L 104 105 101  CO2 22 - 32 mmol/L 25 24 24   Calcium 8.9 - 10.3 mg/dL 8.8(L) 8.5(L) 8.3(L)  Total Protein 6.5 - 8.1 g/dL 5.3(L) 5.0(L) 5.8(L)  Total Bilirubin 0.3 - 1.2 mg/dL 1.3(H) 1.3(H) 3.0(H)  Alkaline Phos 38 - 126 U/L 68 56 62  AST 15 - 41 U/L 21 24 37  ALT 0 - 44 U/L 22 25 32    Lab Results  Component Value Date   WBC 5.7 08/01/2019   HGB 7.7 (L) 08/01/2019   HCT 23.4 (L) 08/01/2019   MCV 94.4 08/01/2019   PLT 28 (LL) 08/01/2019   NEUTROABS 3.5 07/30/2019    CT ANGIO HEAD W OR WO CONTRAST  Result Date: 07/02/2019 CLINICAL DATA:  Headache, acute, normal neuro exam. Additional provided: Headache radiates down both arms and into neck with numbness/weakness in both legs. EXAM: CT ANGIOGRAPHY HEAD AND NECK TECHNIQUE: Multidetector CT imaging of the head and neck was performed using the standard protocol during bolus administration of intravenous contrast. Multiplanar CT image reconstructions and MIPs were obtained to evaluate the vascular anatomy. Carotid stenosis measurements (when applicable) are obtained utilizing NASCET criteria, using the distal internal carotid diameter as the denominator. CONTRAST:  145mL OMNIPAQUE IOHEXOL 350 MG/ML SOLN COMPARISON:  Head CT performed 1  day prior 07/01/2019 FINDINGS: CT HEAD FINDINGS Brain: Stable, mild generalized parenchymal atrophy. 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 There is no acute intracranial hemorrhage. No demarcated cortical infarct. No extra-axial fluid collection. No evidence of intracranial mass. No midline  shift. Vascular: Reported below. Skull: Normal. Negative for fracture or focal lesion. Sinuses: Tiny left maxillary sinus mucous retention cyst. No significant mastoid effusion Orbits: No acute abnormality. Review of the MIP images confirms the above findings CTA NECK FINDINGS Aortic arch: Standard aortic branching. Mild calcified plaque within the visualized aortic arch. Streak artifact from a dense right-sided contrast bolus limits evaluation of the mid to distal right subclavian artery. No hemodynamically significant innominate or proximal subclavian artery stenosis. Right carotid system: CCA and ICA patent within the neck without significant stenosis (50% or greater). Mild mixed plaque within the carotid bifurcation and proximal ICA. Left carotid system: CCA and ICA patent within the neck without significant stenosis (50% or greater). Minimal mixed plaque within the proximal ICA. Vertebral arteries: The vertebral arteries are patent within the neck bilaterally without significant stenosis. The left vertebral artery is slightly dominant. Skeleton: No acute bony abnormality or aggressive osseous lesion. Other neck: No neck mass or cervical lymphadenopathy. Thyroid unremarkable. Upper chest: 8 mm ground-glass nodule within the left upper lobe (series 13, image 331). Review of the MIP images confirms the above findings CTA HEAD FINDINGS Anterior circulation: The intracranial internal carotid arteries are patent without significant stenosis. The M1 middle cerebral arteries are patent without significant stenosis. No M2 proximal branch occlusion or high-grade proximal stenosis is identified. The anterior cerebral arteries are patent without significant proximal stenosis. There is a 1-2 mm vascular protrusion arising from the supraclinoid right ICA which may reflect a tiny aneurysm or infundibulum (series 15, image 92). A 1-2 mm vascular protrusion arising from the supraclinoid left ICA appears to reflect a small  infundibulum (series 15, image 113). Posterior circulation: The intracranial vertebral arteries are patent without significant stenosis, as is the basilar artery. The posterior cerebral arteries are patent bilaterally without significant proximal stenosis posterior communicating arteries are hypoplastic or absent bilaterally. Venous sinuses: Within limitations of contrast timing, no convincing thrombus. Anatomic variants: As described Review of the MIP images confirms the above findings IMPRESSION: CT head: 1. No CT evidence of acute intracranial abnormality. 2. Stable mild generalized parenchymal atrophy. 3. 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 4. Tiny left maxillary sinus mucous retention cyst. CTA neck: 1. The common carotid, internal carotid and vertebral arteries are patent within the neck without hemodynamically significant stenosis. Mild mixed plaque within the right carotid bifurcation, proximal right ICA and proximal left ICA. There is also minimal calcified plaque within the visualized aortic arch. 2. 8 mm ground-glass nodule within the imaged left lung apex. Initial follow-up with CT at 6-12 months is recommended to confirm persistence. If persistent, repeat CT is recommended every 2 years until 5 years of stability has been established. This recommendation follows the consensus statement: Guidelines for Management of Incidental Pulmonary Nodules Detected on CT Images: From the Fleischner Society 2017; Radiology 2017; 284:228-243. CTA head: 1. No intracranial large vessel occlusion or proximal high-grade arterial stenosis. 2. 1-2 mm infundibulum versus tiny aneurysm arising from the supraclinoid right ICA. Electronically Signed   By: Kellie Simmering DO   On: 07/02/2019 12:47   CT ANGIO NECK W OR WO CONTRAST  Result Date: 07/02/2019 CLINICAL  DATA:  Headache, acute, normal neuro exam. Additional provided: Headache radiates down  both arms and into neck with numbness/weakness in both legs. EXAM: CT ANGIOGRAPHY HEAD AND NECK TECHNIQUE: Multidetector CT imaging of the head and neck was performed using the standard protocol during bolus administration of intravenous contrast. Multiplanar CT image reconstructions and MIPs were obtained to evaluate the vascular anatomy. Carotid stenosis measurements (when applicable) are obtained utilizing NASCET criteria, using the distal internal carotid diameter as the denominator. CONTRAST:  130mL OMNIPAQUE IOHEXOL 350 MG/ML SOLN COMPARISON:  Head CT performed 1 day prior 07/01/2019 FINDINGS: CT HEAD FINDINGS Brain: Stable, mild generalized parenchymal atrophy. 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 There is no acute intracranial hemorrhage. No demarcated cortical infarct. No extra-axial fluid collection. No evidence of intracranial mass. No midline shift. Vascular: Reported below. Skull: Normal. Negative for fracture or focal lesion. Sinuses: Tiny left maxillary sinus mucous retention cyst. No significant mastoid effusion Orbits: No acute abnormality. Review of the MIP images confirms the above findings CTA NECK FINDINGS Aortic arch: Standard aortic branching. Mild calcified plaque within the visualized aortic arch. Streak artifact from a dense right-sided contrast bolus limits evaluation of the mid to distal right subclavian artery. No hemodynamically significant innominate or proximal subclavian artery stenosis. Right carotid system: CCA and ICA patent within the neck without significant stenosis (50% or greater). Mild mixed plaque within the carotid bifurcation and proximal ICA. Left carotid system: CCA and ICA patent within the neck without significant stenosis (50% or greater). Minimal mixed plaque within the proximal ICA. Vertebral arteries: The vertebral arteries are patent within the neck bilaterally without significant  stenosis. The left vertebral artery is slightly dominant. Skeleton: No acute bony abnormality or aggressive osseous lesion. Other neck: No neck mass or cervical lymphadenopathy. Thyroid unremarkable. Upper chest: 8 mm ground-glass nodule within the left upper lobe (series 13, image 331). Review of the MIP images confirms the above findings CTA HEAD FINDINGS Anterior circulation: The intracranial internal carotid arteries are patent without significant stenosis. The M1 middle cerebral arteries are patent without significant stenosis. No M2 proximal branch occlusion or high-grade proximal stenosis is identified. The anterior cerebral arteries are patent without significant proximal stenosis. There is a 1-2 mm vascular protrusion arising from the supraclinoid right ICA which may reflect a tiny aneurysm or infundibulum (series 15, image 92). A 1-2 mm vascular protrusion arising from the supraclinoid left ICA appears to reflect a small infundibulum (series 15, image 113). Posterior circulation: The intracranial vertebral arteries are patent without significant stenosis, as is the basilar artery. The posterior cerebral arteries are patent bilaterally without significant proximal stenosis posterior communicating arteries are hypoplastic or absent bilaterally. Venous sinuses: Within limitations of contrast timing, no convincing thrombus. Anatomic variants: As described Review of the MIP images confirms the above findings IMPRESSION: CT head: 1. No CT evidence of acute intracranial abnormality. 2. Stable mild generalized parenchymal atrophy. 3. 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 4. Tiny left maxillary sinus mucous retention cyst. CTA neck: 1. The common carotid, internal carotid and vertebral arteries are patent within the neck without hemodynamically significant stenosis. Mild mixed plaque within the right carotid bifurcation, proximal right  ICA and proximal left ICA. There is also minimal calcified plaque within the visualized aortic arch. 2. 8 mm ground-glass nodule within the imaged left lung apex. Initial follow-up with CT at 6-12 months is recommended to confirm persistence.  If persistent, repeat CT is recommended every 2 years until 5 years of stability has been established. This recommendation follows the consensus statement: Guidelines for Management of Incidental Pulmonary Nodules Detected on CT Images: From the Fleischner Society 2017; Radiology 2017; 284:228-243. CTA head: 1. No intracranial large vessel occlusion or proximal high-grade arterial stenosis. 2. 1-2 mm infundibulum versus tiny aneurysm arising from the supraclinoid right ICA. Electronically Signed   By: Kellie Simmering DO   On: 07/02/2019 12:47   CT ABDOMEN PELVIS W CONTRAST  Result Date: 07/29/2019 CLINICAL DATA:  History of leukemia, currently on chemotherapy, bloody diarrhea EXAM: CT ABDOMEN AND PELVIS WITH CONTRAST TECHNIQUE: Multidetector CT imaging of the abdomen and pelvis was performed using the standard protocol following bolus administration of intravenous contrast. CONTRAST:  111mL OMNIPAQUE IOHEXOL 300 MG/ML  SOLN COMPARISON:  None. FINDINGS: Lower chest: The visualized heart size within normal limits. No pericardial fluid/thickening. No hiatal hernia. Streaky atelectasis or scarring seen at the right lung base. Hepatobiliary: There is a 1.7 cm low-density lesion seen in the posterior right liver lobe which partially fills in on delayed images, likely hemangioma. Main portal vein is patent. No evidence of calcified gallstones, gallbladder wall thickening or biliary dilatation. Pancreas: Unremarkable. No pancreatic ductal dilatation or surrounding inflammatory changes. Spleen: Normal in size without focal abnormality. Adrenals/Urinary Tract: Both adrenal glands appear normal. The kidneys and collecting system appear normal without evidence of urinary tract calculus or  hydronephrosis. Bladder is unremarkable. Stomach/Bowel: The patient is status post Roux-en-Y gastric bypass. The stomach and small bowel otherwise unremarkable. There appears to be mild wall thickening seen around the right colon/cecum with mild fat stranding changes. No loculated fluid collections however are noted. The appendix is unremarkable. The remainder of the colon is unremarkable. Vascular/Lymphatic: There are no enlarged mesenteric, retroperitoneal, or pelvic lymph nodes. Scattered aortic atherosclerotic calcifications are seen without aneurysmal dilatation. Reproductive: The uterus and adnexa are unremarkable. Other: No evidence of abdominal wall mass or hernia. Musculoskeletal: No acute or significant osseous findings. IMPRESSION: Findings which could be suggestive of mild colitis/typhlitis involving the right colon and cecum. No surrounding loculated fluid collections or free air. Aortic Atherosclerosis (ICD10-I70.0). Electronically Signed   By: Prudencio Pair M.D.   On: 07/29/2019 21:28   DG Chest Port 1 View  Result Date: 07/29/2019 CLINICAL DATA:  Weakness EXAM: PORTABLE CHEST 1 VIEW COMPARISON:  06/30/2019 FINDINGS: The heart size and mediastinal contours are within normal limits. Both lungs are clear. The visualized skeletal structures are unremarkable. IMPRESSION: No active disease. Electronically Signed   By: Donavan Foil M.D.   On: 07/29/2019 16:23    ASSESSMENT AND PLAN: Kelly Roberson 59 y.o. female with medical history significant for AML s/p induction therapy and 2 cycles of consolidation. Last cycle of chemo was 07/15/2019. Now admitted secondary to GI bleeding. Had pancytopenia on admission. CT consistent with colitis/typhlitis. WBC normalized.  Has received 3 units PRBCs and 2 units of platelets so far this admission.  Hemoglobin and platelets remain stable.  GI bleeding and diarrhea significantly improved.  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 Elmo Putt for management of her AML.  We have been providing supportive care locally in conjunction with [facility]. --Status post second cycle of induction chemotherapy on 07/15/2019 --Received G-CSF on 07/22/2019 and WBC has normalized. --Has anemia and thrombocytopenia due to recent chemotherapy as well as GI bleed.  This is stable at this point in time. --She will continue  under the care of Dr. Elmo Putt at [facility] for management of her AML and we will continue to follow her closely locally.  #Anemia secondary to recent chemotherapy and GI bleed --CT scan showed findings concerning for colitis/typhlitis. --Continue IV antibiotics. --Appreciate GI consult who does not recommend endoscopic evaluation at this time due to her thrombocytopenia. --Transfuse PRBCs to keep hemoglobin above 8. --Hemoglobin is 7.7 today but since it is very close to her transfusion threshold, will hold off on PRBC transfusion. --Recheck CBC in a.m.  #Thrombocytopenia due to recent chemotherapy --She continues to have active GI bleeding. --Transfuse platelets for platelet count less than 20,000. --We will hold on platelet transfusion today.   --Recheck CBC in a.m.  #Colitis/typhlitis --Transition from IV antibiotics to oral metronidazole and Cipro. --GI following.   LOS: 3 days   Mikey Bussing, DNP, AGPCNP-BC, AOCNP 08/01/19

## 2019-08-01 NOTE — Progress Notes (Addendum)
[facility] Progress Note  CC:  Rectal bleeding  Subjective:  Continues to improve.  Tolerating full liquids.  Would like to eat.  Had about 4 BM since midnight, less volume.  Bleeding much less.  Pain much improved.  Objective:  Vital signs in last 24 hours: Temp:  [98 F (36.7 C)-99.1 F (37.3 C)] 99.1 F (37.3 C) (07/29 0601) Pulse Rate:  [98-105] 105 (07/29 0601) Resp:  [18-19] 19 (07/29 0601) BP: (132-137)/(68-86) 135/76 (07/29 0601) SpO2:  [97 %-99 %] 98 % (07/29 0601) Last BM Date: 07/31/19 General:  Alert, Well-developed, in NAD Heart:  Slightly tachy; no murmurs Pulm:  CTAB.  No increased WOB. Abdomen:  Soft, non-distended.  BS present and somewhat hyperactive.  Minimal to no TTP. Extremities:  Without edema. Neurologic:  Alert and oriented x 4;  grossly normal neurologically. Psych:  Alert and cooperative. Normal mood and affect.  Intake/Output from previous day: 07/28 0701 - 07/29 0700 In: 1200 [P.O.:1200] Out: -   Lab Results: Recent Labs    07/30/19 2157 07/31/19 0344 08/01/19 0420  WBC 5.7 5.1 5.7  HGB 8.3* 7.5* 7.7*  HCT 24.2* 22.0* 23.4*  PLT 28* 23* 28*   BMET Recent Labs    07/30/19 0900 07/31/19 0344 08/01/19 0420  NA 135 137 136  K 2.8* 4.1 3.9  CL 101 105 104  CO2 24 24 25   GLUCOSE 92 94 94  BUN 13 8 <5*  CREATININE 0.38* 0.40* 0.48  CALCIUM 8.3* 8.5* 8.8*   LFT Recent Labs    08/01/19 0420  PROT 5.3*  ALBUMIN 2.8*  AST 21  ALT 22  ALKPHOS 68  BILITOT 1.3*   Assessment / Plan: Kelly Roberson a 59 y.o.femalePMH significant for, but not necessarily limited to, Roux-en-Y, leukemia, IBS.  # Hematochezia with acute on chronic anemia / nausea / generalized lower abdominal pain / abnormal right colon on CT scan --Pain / bleeding could be secondary to typhlitis in setting of neutropenia.  WBC 0.8, abs  neutrophil count 0.3. Count up to 5.7 now. --Hgb 9.0 on 07/22/19. Hematochezia since Friday and presented with hgb of 5.8, up to 6.6 post 2 uPRBC so received another 1 unit.  Now Hgb up to 7.7 grams today. --Discussed with Hematology. Additional platelet transfusion may help with bleeding but also important should intervention be needed to control bleeding.  Platelets 28 today. --Was on Zosyn and now on PO cipro and flagyl. --No indication for colonoscopy for now as she is continuing to improve.  Typhlitis will improve now that white counts have recovered. --Will advance to soft diet.  # Acute myeloid leukemia / severe pancytopenia --Undergoing chemotherapy at [facility].  --Platelets <5, up to 13 post platelet transfusion.  Platelet count 28 today.  # History of Roux-en-Y / Nissen fundoplication  # History of Barrett's esophagus   LOS: 3 days   Laban Emperor. Zehr  08/01/2019, 11:00 AM  GI ATTENDING  Interval history data reviewed.  Patient personally seen and examined.  Agree with comprehensive interval progress note as outlined above.  Patient is doing well.  No significant pain.  Blood cell counts have recovered.  This patient presented with neutrophilic colitis which is resolving with improved white blood cell count and time.  On antibiotics.  I would recommend a 1 week course of outpatient antibiotic therapy, assuming that she is afebrile.  Advance diet as tolerated.  She should return to the care of her primary provider and oncology team at  [facility].  No additional recommendations from GI perspective.  No indication for colonoscopy.  No need for outpatient GI follow-up.  Discussed with patient.  Will sign off.  Docia Chuck. Geri Seminole., M.D. [facility] Gastroenterology

## 2019-08-02 LAB — BASIC METABOLIC PANEL
Anion gap: 6 (ref 5–15)
BUN: <5 mg/dL — ABNORMAL LOW (ref 6–20)
CO2: 24 mmol/L (ref 22–32)
Calcium: 8.3 mg/dL — ABNORMAL LOW (ref 8.9–10.3)
Chloride: 102 mmol/L (ref 98–111)
Creatinine, Ser: 0.42 mg/dL — ABNORMAL LOW (ref 0.44–1.00)
GFR calc Af Amer: >60 mL/min (ref >60)
GFR calc non Af Amer: >60 mL/min (ref >60)
Glucose, Bld: 97 mg/dL (ref 70–99)
Potassium: 3.4 mmol/L — ABNORMAL LOW (ref 3.5–5.1)
Sodium: 132 mmol/L — ABNORMAL LOW (ref 135–145)

## 2019-08-02 LAB — TYPE AND SCREEN
ABO/RH(D): A POS
Antibody Screen: NEGATIVE
Unit division: 0

## 2019-08-02 LAB — BPAM RBC
Blood Product Expiration Date: 202108112359
Unit Type and Rh: 6200

## 2019-08-02 LAB — CBC
HCT: 22.8 % — ABNORMAL LOW (ref 36.0–46.0)
Hemoglobin: 7.4 g/dL — ABNORMAL LOW (ref 12.0–15.0)
MCH: 31.4 pg (ref 26.0–34.0)
MCHC: 32.5 g/dL (ref 30.0–36.0)
MCV: 96.6 fL (ref 80.0–100.0)
Platelets: 41 10*3/uL — ABNORMAL LOW (ref 150–400)
RBC: 2.36 MIL/uL — ABNORMAL LOW (ref 3.87–5.11)
RDW: 17.4 % — ABNORMAL HIGH (ref 11.5–15.5)
WBC: 6.3 10*3/uL (ref 4.0–10.5)
nRBC: 0.5 % — ABNORMAL HIGH (ref 0.0–0.2)

## 2019-08-02 LAB — MAGNESIUM: Magnesium: 1.6 mg/dL — ABNORMAL LOW (ref 1.7–2.4)

## 2019-08-02 LAB — PREPARE RBC (CROSSMATCH)

## 2019-08-02 LAB — PHOSPHORUS: Phosphorus: 3.8 mg/dL (ref 2.5–4.6)

## 2019-08-02 MED ORDER — SODIUM CHLORIDE 0.9% IV SOLUTION: Freq: Once | INTRAVENOUS | Status: AC

## 2019-08-02 MED ORDER — SODIUM CHLORIDE 0.9% IV SOLUTION: Freq: Once | INTRAVENOUS | Status: DC

## 2019-08-02 MED ORDER — POTASSIUM CHLORIDE 20 MEQ PO PACK
40.0000 meq | PACK | Freq: Once | ORAL | Status: AC
  Administered 2019-08-02: 40 meq via ORAL
  Filled 2019-08-02: qty 2

## 2019-08-02 MED ORDER — MAGNESIUM SULFATE 2 GM/50ML IV SOLN
2.0000 g | Freq: Once | INTRAVENOUS | Status: AC
  Administered 2019-08-02: 2 g via INTRAVENOUS
  Filled 2019-08-02: qty 50

## 2019-08-02 NOTE — Progress Notes (Signed)
PROGRESS NOTE    Kelly Roberson  OJJ:[id number] DOB: 1960/05/13 DOA: 07/29/2019 PCP: Ladell Pier, MD   Brief Narrative:  Kelly Roberson is a 59 y.o. female with medical history significant of AML currently on chemotherapy, anxiety, hypertension, IBS, history of gastric bypass surgery in 2013 presenting with complaints of bloody diarrhea and abdominal pain.  Patient reports 3-day history of multiple episodes of nonbloody diarrhea and bilateral lower quadrant abdominal pain.  She has also had nausea and dry heaves.  She is not on any blood thinners.  Reports having fever and chills 2 days ago.  States she has received blood and platelet transfusions previously as she is currently on chemotherapy.  She is being comanaged by oncology at [facility] and here in [place].  Does report a few episodes of chest discomfort and dyspnea on exertion recently.  Denies any chest pain or dyspnea at present. ED Labs showing worsening pancytopenia-WBC count 0.8 (ANC 0.3), hemoglobin 6.1, and platelet count less than 5k.  FOBT positive.  Potassium 2.8.  Lactic acid 3.6.  Lipase normal and no significant elevation of LFTs.  Blood culture x2 pending. CT abdomen pelvis showing findings suggestive of mild colitis/typhilitis involving the right colon and cecum.  No surrounding loculated fluid collections or free air.Oncology recommended blood and platelet transfusions.  Recommended against doing a Covid swab due to patient's neutropenia and thrombocytopenia. GI consulted recommended to transfuse platelets and PRBC, hold colonoscopy until platelet count and hemoglobin improves. Admitted for sepsis secondary to colitis, pancytopenia and GI bleeding.  White cell count is improving, antibiotics switched to ciprofloxacin and Flagyl, GI recommended no indications for colonoscopy at this point.  Lower GI bleeding has resolved.  Platelet count has improved to 41, hemoglobin has improved to 7.4.  Patient is cleared from GI to be  discharged on oral antibiotics.   Assessment & Plan:   Principal Problem:   Colitis Active Problems:   AML (acute myeloblastic leukemia) ([place])   Sepsis ([place])   Hypokalemia   Pancytopenia ([place])   Typhlitis  Sepsis secondary to colitis: Patient is presenting with complaints of abdominal pain and bloody diarrhea.  She had a low-grade temperature.  She was slightly tachycardic on arrival and blood pressure soft.  Tachycardia has resolved and blood pressure improved after IV fluid bolus.  Labs showing worsening neutropenia and anemia.  FOBT positive.  Lactic acid 3.6. CT abdomen pelvis showing findings suggestive of mild colitis/typhilitis involving the right colon and cecum.  No surrounding loculated fluid collections or free air. -Continue Zosyn and IV fluid hydration.  Morphine as needed for pain.  Zofran as needed for nausea.  Trend lactic acid.  Blood culture x2 pending.  Since patient is currently hemodynamically stable. -Blood cultures: 1 bottle shows coagulase-negative staph aureus which could be contaminant. continue Zosyn for now. C. difficile PCR and GI pathogen panel have also been ordered and currently pending. -GI consulted recommended patient is not stable for colonoscopy given thrombocytopenia and anemia. Transfuse PRBC and platelet until hemoglobin and platelets are stable. White cell count is improving, antibiotic changed to ciprofloxacin and Flagyl. GI signed off, no indication for colonoscopy at this point. Continue ciprofloxacin and Flagyl for 10 days.  Symptomatic anemia/ pancytopenia: Likely related to chemotherapy.  Now worsened on labs - WBC count 0.8 (ANC 0.3), hemoglobin 6.1, and platelet count less than 5k.  Oncology recommended blood and platelet transfusions. -Type and screen.  3 units PRBCs and 2 unit platelets ordered in the ED.  Order posttransfusion CBC  and continue to monitor closely.   -Oncology consulted recommended transfuse platelets count for less than  20,000. Hemoglobin 7.5 and platelet count 23 today. Oncology recommended keep hemoglobin above 8. GI recommended no plans for colonoscopy since hemoglobin is stable, denies any further GI bleeding.   Chest pain, dyspnea on exertion:  Resolved. Likely due to symptomatic anemia.  Currently asymptomatic.  EKG without acute ischemic changes. -Cardiac monitoring.  Check high-sensitivity troponin level.  Continue blood transfusions as mentioned above.  Hypokalemia: Resolved. Likely due to GI loss from diarrhea and home diuretic use.  Potassium 2.8.  EKG without acute changes. -Cardiac monitoring.  Replete potassium.  Check magnesium level and replete if low.  Continue to monitor electrolytes.  Hold diuretic at this time.  AML: Followed by oncology and currently on chemotherapy.   -[name] consulted recommended transfuse platelets for count less than 20,000 transfuse PRBC to keep hemoglobin above 8.  Hypertension -Hold antihypertensives at this time given concern for sepsis/soft blood pressure.   DVT prophylaxis:  SCDS Code Status: Full  Family Communication: No family at bedside. Disposition Plan:  Dispo: The patient is from: Home  Anticipated d/c is to: Home  Anticipated d/c date is:7/31.  Patient currently is not medically stable to d/c.  Ongoing work-up,  patient is getting blood transfusion will be discharged tomorrow.  Consultants:   GI, hematology  Procedures: None Antimicrobials:  Anti-infectives (From admission, onward)   Start     Dose/Rate Route Frequency Ordered Stop   08/01/19 1000  ciprofloxacin (CIPRO) tablet 750 mg     Discontinue     750 mg Oral 2 times daily 08/01/19 0929     08/01/19 0945  metroNIDAZOLE (FLAGYL) tablet 500 mg     Discontinue     500 mg Oral Every 8 hours 08/01/19 0929     07/30/19 0200  piperacillin-tazobactam (ZOSYN) IVPB 3.375 g  Status:  Discontinued        3.375 g 12.5 mL/hr over 240 Minutes  Intravenous Every 8 hours 07/29/19 2339 08/01/19 0929   07/29/19 1700  piperacillin-tazobactam (ZOSYN) IVPB 3.375 g        3.375 g 100 mL/hr over 30 Minutes Intravenous  Once 07/29/19 1650 07/29/19 2020      Subjective: Patient was seen and examined at bedside.  No overnight events.  She feels much better, denies any further GI bleeding.  Stools are slightly brownish.  She has received 3 PRBCs and 2 platelet transfusion so far her platelet count is 41 and hemoglobin is 7.4.   Objective: Vitals:   08/01/19 2108 08/02/19 0425 08/02/19 1348 08/02/19 1403  BP: (!) 136/68 (!) 111/64 111/67 125/74  Pulse: 88 90 80 82  Resp: 15 21 16 16   Temp: 97.8 F (36.6 C) 98.8 F (37.1 C) 97.8 F (36.6 C) 97.6 F (36.4 C)  TempSrc: Oral Oral Oral Oral  SpO2: 100% 97% 100% 98%  Weight:      Height:        Intake/Output Summary (Last 24 hours) at 08/02/2019 1440 Last data filed at 08/02/2019 1348 Gross per 24 hour  Intake 250 ml  Output --  Net 250 ml   Filed Weights   07/29/19 1423  Weight: 90.7 kg    Examination:  General exam: Appears calm and comfortable  Respiratory system: Clear to auscultation. Respiratory effort normal. Cardiovascular system: S1 & S2 heard, RRR. No JVD, murmurs, rubs, gallops or clicks. No pedal edema. Gastrointestinal system: Abdomen is nondistended, soft and nontender.  No organomegaly or masses felt. Normal bowel sounds heard. Central nervous system: Alert and oriented. No focal neurological deficits. Extremities: Symmetric 5 x 5 power. Skin: No rashes, lesions or ulcers Psychiatry: Judgement and insight appear normal. Mood & affect appropriate.     Data Reviewed: I have personally reviewed following labs and imaging studies  CBC: Recent Labs  Lab 07/29/19 1449 07/30/19 0029 07/30/19 0847 07/30/19 2157 07/31/19 0344 08/01/19 0420 08/02/19 0323  WBC 0.8*   < > 3.7* 5.7 5.1 5.7 6.3  NEUTROABS 0.3*  --  2.1 3.5  --   --   --   HGB 6.1*   < > 6.6*  8.3* 7.5* 7.7* 7.4*  HCT 18.6*   < > 19.4* 24.2* 22.0* 23.4* 22.8*  MCV 100.0   < > 93.3 91.7 92.1 94.4 96.6  PLT <5*   < > 13* 28* 23* 28* 41*   < > = values in this interval not displayed.   Basic Metabolic Panel: Recent Labs  Lab 07/29/19 1449 07/30/19 0029 07/30/19 0900 07/31/19 0344 08/01/19 0420 08/02/19 0323  NA 133*  --  135 137 136 132*  K 2.8*  --  2.8* 4.1 3.9 3.4*  CL 102  --  101 105 104 102  CO2 21*  --  24 24 25 24   GLUCOSE 122*  --  92 94 94 97  BUN 18  --  13 8 <5* <5*  CREATININE 0.54  --  0.38* 0.40* 0.48 0.42*  CALCIUM 8.0*  --  8.3* 8.5* 8.8* 8.3*  MG  --  1.5* 2.0 1.8 1.7 1.6*  PHOS  --   --  3.5 3.1 3.5 3.8   GFR: Estimated Creatinine Clearance: 88.1 mL/min (A) (by C-G formula based on SCr of 0.42 mg/dL (L)). Liver Function Tests: Recent Labs  Lab 07/29/19 1449 07/30/19 0900 07/31/19 0344 08/01/19 0420  AST 44* 37 24 21  ALT 35 32 25 22  ALKPHOS 61 62 56 68  BILITOT 1.0 3.0* 1.3* 1.3*  PROT 5.3* 5.8* 5.0* 5.3*  ALBUMIN 2.6* 2.9* 2.5* 2.8*   Recent Labs  Lab 07/29/19 1449  LIPASE 14   No results for input(s): AMMONIA in the last 168 hours. Coagulation Profile: No results for input(s): INR, PROTIME in the last 168 hours. Cardiac Enzymes: No results for input(s): CKTOTAL, CKMB, CKMBINDEX, TROPONINI in the last 168 hours. BNP (last 3 results) No results for input(s): PROBNP in the last 8760 hours. HbA1C: No results for input(s): HGBA1C in the last 72 hours. CBG: No results for input(s): GLUCAP in the last 168 hours. Lipid Profile: No results for input(s): CHOL, HDL, LDLCALC, TRIG, CHOLHDL, LDLDIRECT in the last 72 hours. Thyroid Function Tests: No results for input(s): TSH, T4TOTAL, FREET4, T3FREE, THYROIDAB in the last 72 hours. Anemia Panel: No results for input(s): VITAMINB12, FOLATE, FERRITIN, TIBC, IRON, RETICCTPCT in the last 72 hours. Sepsis Labs: Recent Labs  Lab 07/29/19 1449 07/30/19 0029  LATICACIDVEN 3.6* 1.5     Recent Results (from the past 240 hour(s))  Blood culture (routine x 2)     Status: Abnormal   Collection Time: 07/29/19  4:51 PM   Specimen: BLOOD  Result Value Ref Range Status   Specimen Description   Final    BLOOD RIGHT ANTECUBITAL Performed at [facility], [place] [address], [place], [place] [address]    Special Requests   Final    BOTTLES DRAWN AEROBIC AND ANAEROBIC Blood Culture adequate volume Performed at [facility]  [place] [address], [place], [place] [address]    Culture  Setup Time   Final    GRAM POSITIVE COCCI IN CLUSTERS AEROBIC BOTTLE ONLY Organism ID to follow CRITICAL RESULT CALLED TO, READ BACK BY AND VERIFIED WITH: PHARMD LIANNE POINDEXTER BY MESSAN [place] ON 07/31/2019 AT 0100    Culture (A)  Final    STAPHYLOCOCCUS SPECIES (COAGULASE NEGATIVE) THE SIGNIFICANCE OF ISOLATING THIS ORGANISM FROM A SINGLE SET OF BLOOD CULTURES WHEN MULTIPLE SETS ARE DRAWN IS UNCERTAIN. PLEASE NOTIFY THE MICROBIOLOGY DEPARTMENT WITHIN ONE WEEK IF SPECIATION AND SENSITIVITIES ARE REQUIRED. Performed at [facility], [place] [address], [place], [place] [address]    Report Status 08/01/2019 FINAL  Final  Blood Culture ID Panel (Reflexed)     Status: Abnormal   Collection Time: 07/29/19  4:51 PM  Result Value Ref Range Status   Enterococcus species NOT DETECTED NOT DETECTED Final   Listeria monocytogenes NOT DETECTED NOT DETECTED Final   Staphylococcus species DETECTED (A) NOT DETECTED Final    Comment: Methicillin (oxacillin) susceptible coagulase negative staphylococcus. Possible blood culture contaminant (unless isolated from more than one blood culture draw or clinical case suggests pathogenicity). No antibiotic treatment is indicated for blood  culture contaminants. CRITICAL RESULT CALLED TO, READ BACK BY AND VERIFIED WITH: PHARMD LIANNE POINDEXTER BY [place] ON 07/31/2019 AT 0100    Staphylococcus aureus (BCID) NOT DETECTED NOT  DETECTED Final   Methicillin resistance NOT DETECTED NOT DETECTED Final   Streptococcus species NOT DETECTED NOT DETECTED Final   Streptococcus agalactiae NOT DETECTED NOT DETECTED Final   Streptococcus pneumoniae NOT DETECTED NOT DETECTED Final   Streptococcus pyogenes NOT DETECTED NOT DETECTED Final   Acinetobacter baumannii NOT DETECTED NOT DETECTED Final   Enterobacteriaceae species NOT DETECTED NOT DETECTED Final   Enterobacter cloacae complex NOT DETECTED NOT DETECTED Final   Escherichia coli NOT DETECTED NOT DETECTED Final   Klebsiella oxytoca NOT DETECTED NOT DETECTED Final   Klebsiella pneumoniae NOT DETECTED NOT DETECTED Final   Proteus species NOT DETECTED NOT DETECTED Final   Serratia marcescens NOT DETECTED NOT DETECTED Final   Haemophilus influenzae NOT DETECTED NOT DETECTED Final   Neisseria meningitidis NOT DETECTED NOT DETECTED Final   Pseudomonas aeruginosa NOT DETECTED NOT DETECTED Final   Candida albicans NOT DETECTED NOT DETECTED Final   Candida glabrata NOT DETECTED NOT DETECTED Final   Candida krusei NOT DETECTED NOT DETECTED Final   Candida parapsilosis NOT DETECTED NOT DETECTED Final   Candida tropicalis NOT DETECTED NOT DETECTED Final    Comment: Performed at [facility], [place]. [address] [place], [place] [address]  Blood culture (routine x 2)     Status: None (Preliminary result)   Collection Time: 07/30/19 12:29 AM   Specimen: BLOOD  Result Value Ref Range Status   Specimen Description   Final    BLOOD LEFT ANTECUBITAL Performed at [facility] [address], [place], [place] [address]    Special Requests   Final    BOTTLES DRAWN AEROBIC AND ANAEROBIC Blood Culture adequate volume Performed at [facility] [address], [place], [place] [address]    Culture   Final    NO GROWTH 2 DAYS Performed at [place] [address] [place], [place] [address]    Report Status PENDING  Incomplete  SARS  Coronavirus 2 by RT PCR (hospital order, performed in [facility] hospital lab) Nasopharyngeal Nasopharyngeal Swab  Status: None   Collection Time: 07/30/19  8:51 AM   Specimen: Nasopharyngeal Swab  Result Value Ref Range Status   SARS Coronavirus 2 NEGATIVE NEGATIVE Final    Comment: (NOTE) SARS-CoV-2 target nucleic acids are NOT DETECTED.  The SARS-CoV-2 RNA is generally detectable in upper and lower respiratory specimens during the acute phase of infection. The lowest concentration of SARS-CoV-2 viral copies this assay can detect is 250 copies / mL. A negative result does not preclude SARS-CoV-2 infection and should not be used as the sole basis for treatment or other patient management decisions.  A negative result may occur with improper specimen collection / handling, submission of specimen other than nasopharyngeal swab, presence of viral mutation(s) within the areas targeted by this assay, and inadequate number of viral copies (<250 copies / mL). A negative result must be combined with clinical observations, patient history, and epidemiological information.  Fact Sheet for Patients:   StrictlyIdeas.no  Fact Sheet for Healthcare Providers: BankingDealers.co.za  This test is not yet approved or  cleared by the [place] FDA and has been authorized for detection and/or diagnosis of SARS-CoV-2 by FDA under an Emergency Use Authorization (EUA).  This EUA will remain in effect (meaning this test can be used) for the duration of the COVID-19 declaration under Section 564(b)(1) of the Act, 21 U.S.C. section 360bbb-3(b)(1), unless the authorization is terminated or revoked sooner.  Performed at [facility], [place] [address] [place], [place] [address]     Radiology Studies: No results found. Scheduled Meds: . ciprofloxacin  750 mg Oral BID  . feeding supplement  1 Container Oral TID BM  . metroNIDAZOLE   500 mg Oral Q8H   Continuous Infusions:    LOS: 4 days    Time spent:  25 mins   Shawna Clamp, MD [facility]   If 7PM-7AM, please contact night-coverage

## 2019-08-02 NOTE — TOC Initial Note (Signed)
Transition of Care [facility]) - Initial/Assessment Note    Patient Details  Name: Kelly Roberson MRN: [id number] Date of Birth: [birth year]  [facility] CM/SW Contact:    Trish Mage, LCSW Phone Number: 08/02/2019, 2:31 PM  Clinical Narrative:  Patient seen in follow up to MD consult re: "transportation issues."  Had a delightful conversation with Ms Novitski, who informs me she has recently been approved for both MCD and disability, which is a huge relief.  She states she has transportation home from a friend tomorrow, and that she has transportation to [facility] for her treatment sessions.  I informed her about [facility] and encouraged her to sign up for MCD transportation so that she can get to local appiontments. No other needs identified. TOC will continue to follow during the course of hospitalization.                  Expected Discharge Plan: Home/Self Care Barriers to Discharge: No Barriers Identified   Patient Goals and CMS Choice        Expected Discharge Plan and Services Expected Discharge Plan: Home/Self Care   Discharge Planning Services: CM Consult   Living arrangements for the past 2 months: Single Family Home                                      Prior Living Arrangements/Services Living arrangements for the past 2 months: Single Family Home Lives with:: Self Patient language and need for interpreter reviewed:: Yes Do you feel safe going back to the place where you live?: Yes      Need for Family Participation in Patient Care: Yes (Comment) Care giver support system in place?: Yes (comment)   Criminal Activity/Legal Involvement Pertinent to Current Situation/Hospitalization: No - Comment as needed  Activities of Daily Living Home Assistive Devices/Equipment: Eyeglasses, Environmental consultant (specify type), Shower chair with back ADL Screening (condition at time of admission) Patient's cognitive ability adequate to safely complete daily activities?: Yes Is the  patient deaf or have difficulty hearing?: No Does the patient have difficulty seeing, even when wearing glasses/contacts?: No Does the patient have difficulty concentrating, remembering, or making decisions?: Yes Patient able to express need for assistance with ADLs?: Yes Does the patient have difficulty dressing or bathing?: Yes Independently performs ADLs?: No Communication: Independent Dressing (OT): Needs assistance Is this a change from baseline?: Pre-admission baseline Grooming: Independent Feeding: Independent Bathing: Needs assistance Is this a change from baseline?: Pre-admission baseline Toileting: Needs assistance Is this a change from baseline?: Pre-admission baseline In/Out Bed: Needs assistance Is this a change from baseline?: Pre-admission baseline Walks in Home: Needs assistance Is this a change from baseline?: Pre-admission baseline Does the patient have difficulty walking or climbing stairs?: Yes Weakness of Legs: Both Weakness of Arms/Hands: Both  Permission Sought/Granted                  Emotional Assessment Appearance:: Appears stated age Attitude/Demeanor/Rapport: Engaged Affect (typically observed): Appropriate Orientation: : Oriented to Self, Oriented to Place, Oriented to  Time, Oriented to Situation Alcohol / Substance Use: Illicit Drugs Psych Involvement: No (comment)  Admission diagnosis:  Colitis [K52.9] Lower GI bleed [K92.2] Pancytopenia ([place]) [D61.818] Abdominal pain, unspecified abdominal location [R10.9] Patient Active Problem List   Diagnosis Date Noted  . Typhlitis   . Colitis 07/29/2019  . Sepsis ([place]) 07/29/2019  . Hypokalemia 07/29/2019  . Pancytopenia ([place]) 07/29/2019  .  Antineoplastic chemotherapy induced pancytopenia ([place]) 07/01/2019  . Pancytopenia due to antineoplastic chemotherapy ([place]) 07/01/2019  . Headache 07/01/2019  . Neutropenia ([place]) 07/01/2019  . Macrocytic anemia 05/01/2019  . AML (acute myeloblastic  leukemia) ([place]) 05/01/2019  . Neck pain on right side 05/01/2019  . Leukocytosis 05/01/2019  . Bicytopenia 04/30/2019  . Macrocytosis without anemia 02/12/2019  . Family history of hemochromatosis 02/12/2019  . Mixed hyperlipidemia 01/18/2019  . Abnormal LFTs 01/18/2019  . Dandruff in adult 01/18/2019  . Hair loss 01/18/2019  . Muscle weakness 01/18/2019  . Peripheral neuropathy 01/18/2019  . Other fatigue 01/18/2019  . Essential hypertension 06/01/2018  . Palpitations 06/01/2018  . Anxiety disorder 06/01/2018  . Insomnia 06/01/2018  . Irritable bowel syndrome with diarrhea 06/01/2018  . Barrett's esophagus with dysplasia 06/01/2018   PCP:  Ladell Pier, MD Pharmacy:   [facility], [place] [address] [place] [address] Phone: [phone] Fax: [phone]     Social Determinants of Health (SDOH) Interventions    Readmission Risk Interventions No flowsheet data found.

## 2019-08-02 NOTE — Progress Notes (Addendum)
HEMATOLOGY-ONCOLOGY PROGRESS NOTE  SUBJECTIVE: Continues to feel better this morning.  Reports that GI bleeding has resolved.  She is still having loose stools but better than prior to admission.  Abdominal pain improved.  States that she is hungry and wants to be changed to a regular diet.  Afebrile and other vital signs remain stable.  REVIEW OF SYSTEMS:   Constitutional: No fevers or chills Eyes: Denies blurriness of vision Ears, nose, mouth, throat, and face: Denies mucositis or sore throat Respiratory: Denies cough, dyspnea or wheezes Cardiovascular: Denies palpitation, chest discomfort Gastrointestinal: Bloody stools resolved.  Still with diarrhea but improved. Skin: Bruises easily.Denies abnormal skin rashes Lymphatics: Denies new lymphadenopathy Neurological:Denies numbness, tingling or new weaknesses Behavioral/Psych: Mood is stable, no new changes  Extremities: No lower extremity edema All other systems were reviewed with the patient and are negative.  I have reviewed the past medical history, past surgical history, social history and family history with the patient and they are unchanged from previous note.   PHYSICAL EXAMINATION: ECOG PERFORMANCE STATUS: 1 - Symptomatic but completely ambulatory  Vitals:   08/01/19 2108 08/02/19 0425  BP: (!) 136/68 (!) 111/64  Pulse: 88 90  Resp: 15 21  Temp: 97.8 F (36.6 C) 98.8 F (37.1 C)  SpO2: 100% 97%   Filed Weights   07/29/19 1423  Weight: 90.7 kg    Intake/Output from previous day: 07/29 0701 - 07/30 0700 In: 480 [P.O.:480] Out: -   GENERAL:alert, no distress and comfortable SKIN: Multiple ecchymoses to arms and one below left eye EYES: normal, Conjunctiva are pink and non-injected, sclera clear OROPHARYNX:no exudate, no erythema and lips, buccal mucosa, and tongue normal  LUNGS: clear to auscultation and percussion with normal breathing effort HEART: regular rate & rhythm and no murmurs and no lower extremity  edema ABDOMEN:abdomen soft, non-tender and normal bowel sounds NEURO: alert & oriented x 3 with fluent speech, no focal motor/sensory deficits  LABORATORY DATA:  I have reviewed the data as listed CMP Latest Ref Rng & Units 08/02/2019 08/01/2019 07/31/2019  Glucose 70 - 99 mg/dL 97 94 94  BUN 6 - 20 mg/dL <5(L) <5(L) 8  Creatinine 0.44 - 1.00 mg/dL 0.42(L) 0.48 0.40(L)  Sodium 135 - 145 mmol/L 132(L) 136 137  Potassium 3.5 - 5.1 mmol/L 3.4(L) 3.9 4.1  Chloride 98 - 111 mmol/L 102 104 105  CO2 22 - 32 mmol/L 24 25 24   Calcium 8.9 - 10.3 mg/dL 8.3(L) 8.8(L) 8.5(L)  Total Protein 6.5 - 8.1 g/dL - 5.3(L) 5.0(L)  Total Bilirubin 0.3 - 1.2 mg/dL - 1.3(H) 1.3(H)  Alkaline Phos 38 - 126 U/L - 68 56  AST 15 - 41 U/L - 21 24  ALT 0 - 44 U/L - 22 25    Lab Results  Component Value Date   WBC 6.3 08/02/2019   HGB 7.4 (L) 08/02/2019   HCT 22.8 (L) 08/02/2019   MCV 96.6 08/02/2019   PLT 41 (L) 08/02/2019   NEUTROABS 3.5 07/30/2019    CT ABDOMEN PELVIS W CONTRAST  Result Date: 07/29/2019 CLINICAL DATA:  History of leukemia, currently on chemotherapy, bloody diarrhea EXAM: CT ABDOMEN AND PELVIS WITH CONTRAST TECHNIQUE: Multidetector CT imaging of the abdomen and pelvis was performed using the standard protocol following bolus administration of intravenous contrast. CONTRAST:  186mL OMNIPAQUE IOHEXOL 300 MG/ML  SOLN COMPARISON:  None. FINDINGS: Lower chest: The visualized heart size within normal limits. No pericardial fluid/thickening. No hiatal hernia. Streaky atelectasis or scarring seen at the  right lung base. Hepatobiliary: There is a 1.7 cm low-density lesion seen in the posterior right liver lobe which partially fills in on delayed images, likely hemangioma. Main portal vein is patent. No evidence of calcified gallstones, gallbladder wall thickening or biliary dilatation. Pancreas: Unremarkable. No pancreatic ductal dilatation or surrounding inflammatory changes. Spleen: Normal in size without  focal abnormality. Adrenals/Urinary Tract: Both adrenal glands appear normal. The kidneys and collecting system appear normal without evidence of urinary tract calculus or hydronephrosis. Bladder is unremarkable. Stomach/Bowel: The patient is status post Roux-en-Y gastric bypass. The stomach and small bowel otherwise unremarkable. There appears to be mild wall thickening seen around the right colon/cecum with mild fat stranding changes. No loculated fluid collections however are noted. The appendix is unremarkable. The remainder of the colon is unremarkable. Vascular/Lymphatic: There are no enlarged mesenteric, retroperitoneal, or pelvic lymph nodes. Scattered aortic atherosclerotic calcifications are seen without aneurysmal dilatation. Reproductive: The uterus and adnexa are unremarkable. Other: No evidence of abdominal wall mass or hernia. Musculoskeletal: No acute or significant osseous findings. IMPRESSION: Findings which could be suggestive of mild colitis/typhlitis involving the right colon and cecum. No surrounding loculated fluid collections or free air. Aortic Atherosclerosis (ICD10-I70.0). Electronically Signed   By: Prudencio Pair M.D.   On: 07/29/2019 21:28   DG Chest Port 1 View  Result Date: 07/29/2019 CLINICAL DATA:  Weakness EXAM: PORTABLE CHEST 1 VIEW COMPARISON:  06/30/2019 FINDINGS: The heart size and mediastinal contours are within normal limits. Both lungs are clear. The visualized skeletal structures are unremarkable. IMPRESSION: No active disease. Electronically Signed   By: Donavan Foil M.D.   On: 07/29/2019 16:23    ASSESSMENT AND PLAN: Kelly Roberson 59 y.o. female with medical history significant for AML s/p induction therapy and 2 cycles of consolidation. Last cycle of chemo was 07/15/2019. Now admitted secondary to GI bleeding. Had pancytopenia on admission. CT consistent with colitis/typhlitis. WBC normalized.  Has received 3 units PRBCs and 2 units of platelets so far this  admission.  Hemoglobin down slightly to 7.4 this morning but platelets have improved to 41,000.  She is no longer having GI bleeding.  Diarrhea improved.  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 Elmo Putt for management of her AML.  We have been providing supportive care locally in conjunction with [facility]. --Status post second cycle of induction chemotherapy on 07/15/2019 --Received G-CSF on 07/22/2019 and WBC has normalized. --Has anemia and thrombocytopenia due to recent chemotherapy as well as GI bleed.   --She will continue under the care of Dr. Elmo Putt at [facility] for management of her AML and we will continue to follow her closely locally.  #Anemia secondary to recent chemotherapy and GI bleed --CT scan showed findings concerning for colitis/typhlitis. --Continue IV antibiotics. --Appreciate GI consult who does not recommend endoscopic evaluation at this time due to her thrombocytopenia. --Transfuse PRBCs to keep hemoglobin above 8. --Hemoglobin is 7.4 today.  We will give 1 unit PRBCs today. --We will continue to monitor CBC closely.  #Thrombocytopenia due to recent chemotherapy --GI bleeding has now resolved --Transfuse platelets for platelet count less than 20,000. --Platelet count up to 41,000 this morning and no platelet transfusion recommended.   --Monitor CBC closely  #Colitis/typhlitis --On oral antibiotics and remains afebrile. --Diarrhea continues to improve --I have advanced her to a regular diet per her request. --GI has recommended a 1 week course of outpatient antibiotic therapy.  From our standpoint, the patient may be  discharged home when otherwise medically stable.  We would recommend that she receive 1 unit of PRBCs prior to discharge.  I have ordered this.  We will continue to check her labs twice a week.  Appointments are already scheduled.   LOS: 4 days   Mikey Bussing, DNP, AGPCNP-BC, AOCNP 08/02/19

## 2019-08-03 DIAGNOSIS — C9201 Acute myeloblastic leukemia, in remission: Secondary | ICD-10-CM

## 2019-08-03 LAB — BASIC METABOLIC PANEL
Anion gap: 7 (ref 5–15)
BUN: <5 mg/dL — ABNORMAL LOW (ref 6–20)
CO2: 24 mmol/L (ref 22–32)
Calcium: 8.6 mg/dL — ABNORMAL LOW (ref 8.9–10.3)
Chloride: 103 mmol/L (ref 98–111)
Creatinine, Ser: 0.42 mg/dL — ABNORMAL LOW (ref 0.44–1.00)
GFR calc Af Amer: >60 mL/min (ref >60)
GFR calc non Af Amer: >60 mL/min (ref >60)
Glucose, Bld: 127 mg/dL — ABNORMAL HIGH (ref 70–99)
Potassium: 3.4 mmol/L — ABNORMAL LOW (ref 3.5–5.1)
Sodium: 134 mmol/L — ABNORMAL LOW (ref 135–145)

## 2019-08-03 LAB — TYPE AND SCREEN
ABO/RH(D): A POS
Antibody Screen: NEGATIVE
Unit division: 0

## 2019-08-03 LAB — BPAM RBC
Blood Product Expiration Date: 202108112359
ISSUE DATE / TIME: 202107301344
Unit Type and Rh: 6200

## 2019-08-03 LAB — MAGNESIUM: Magnesium: 1.9 mg/dL (ref 1.7–2.4)

## 2019-08-03 LAB — CBC
HCT: 27.1 % — ABNORMAL LOW (ref 36.0–46.0)
Hemoglobin: 8.9 g/dL — ABNORMAL LOW (ref 12.0–15.0)
MCH: 31 pg (ref 26.0–34.0)
MCHC: 32.8 g/dL (ref 30.0–36.0)
MCV: 94.4 fL (ref 80.0–100.0)
Platelets: 72 10*3/uL — ABNORMAL LOW (ref 150–400)
RBC: 2.87 MIL/uL — ABNORMAL LOW (ref 3.87–5.11)
RDW: 17.6 % — ABNORMAL HIGH (ref 11.5–15.5)
WBC: 7.3 10*3/uL (ref 4.0–10.5)
nRBC: 0.8 % — ABNORMAL HIGH (ref 0.0–0.2)

## 2019-08-03 MED ORDER — CIPROFLOXACIN HCL 750 MG PO TABS: 750.0000 mg | ORAL_TABLET | Freq: Two times a day (BID) | ORAL | 0 refills | Status: AC

## 2019-08-03 MED ORDER — POTASSIUM CHLORIDE 20 MEQ PO PACK: 40.0000 meq | PACK | Freq: Once | ORAL | Status: DC

## 2019-08-03 MED ORDER — LORAZEPAM 1 MG PO TABS
1.0000 mg | ORAL_TABLET | ORAL | Status: AC
  Administered 2019-08-03: 1 mg via ORAL
  Filled 2019-08-03: qty 1

## 2019-08-03 MED ORDER — METRONIDAZOLE 500 MG PO TABS: 500.0000 mg | ORAL_TABLET | Freq: Three times a day (TID) | ORAL | 0 refills | Status: DC

## 2019-08-03 NOTE — TOC Transition Note (Signed)
Transition of Care [facility]) - CM/SW Discharge Note   Patient Details  Name: Kelly Roberson MRN: [id number] Date of Birth: 07-Dec-1960  [facility] CM/SW Contact:  Lia Hopping, Sherrill Phone Number: 08/03/2019, 10:21 AM   Clinical Narrative:    KG:OVPCHEKBTCYELY Issues Patient coordinated a ride home with family but family unable to transport today. Patient does not have funds to pay for transportation. CSW confirm pt. Physical address. CSW completed a taxi voucher. Per nurse, the patient is ambulatory without assistance and can transport safely in a taxi.  No other needs identified.    Final next level of care: Home/Self Care Barriers to Discharge: No Barriers Identified   Patient Goals and CMS Choice     Choice offered to / list presented to : NA  Discharge Placement                       Discharge Plan and Services   Discharge Planning Services: CM Consult            DME Arranged: N/A DME Agency: NA         HH Agency: NA        Social Determinants of Health (SDOH) Interventions     Readmission Risk Interventions No flowsheet data found.

## 2019-08-03 NOTE — Discharge Instructions (Signed)
Advised to follow-up with primary care physician in 1 week. Advised to follow-up with oncologist [name] as scheduled. Advised to take ciprofloxacin and Flagyl for 7 more days to complete 10-day course for typhlitis.

## 2019-08-03 NOTE — Discharge Summary (Signed)
Physician Discharge Summary  Kelly Roberson GNO:[id number] DOB: 06/15/1960 DOA: 07/29/2019  PCP: Ladell Pier, MD  Admit date: 07/29/2019   Discharge date: 08/03/2019  Admitted From: Home.  Disposition:  Home.  Recommendations for Outpatient Follow-up:  1. Follow up with PCP in 1-2 weeks. 2. Please obtain BMP/CBC in one week. 3. Advised to follow-up with oncologist [name] as scheduled. 4. Advised to take ciprofloxacin and Flagyl for 7 more days to complete 10-day course for typhlitis.  Home Health: None.  Equipment/Devices: None.  Discharge Condition: Stable CODE STATUS:Full code Diet recommendation: Heart Healthy   Brief Summary / Hospital course: Kelly Roberson a 59 y.o.femalewith medical history significant ofAML currently on chemotherapy, anxiety, hypertension, IBS, history of gastric bypass surgery in 2013 presenting with complaints of bloody diarrhea and abdominal pain.Patient reports 3-day history of multiple episodes of nonbloody diarrhea and bilateral lower quadrant abdominal pain. She has also had nausea and dry heaves. She is not on any blood thinners. Reports having fever and chills 2 days ago. States she has received blood and platelet transfusions previously as she is currently on chemotherapy. She is being comanaged by oncology at [facility] and here in [place]. Does report a few episodes of chest discomfort and dyspnea on exertion recently. Denies any chest pain or dyspnea at present. EDLabs showing worsening pancytopenia- WBC count 0.8 (ANC 0.3), hemoglobin 6.1, and platelet count less than 5k.FOBT positive. Potassium 2.8. Lactic acid 3.6. Lipase normal and no significant elevation of LFTs. CT abdomen pelvis showing findings suggestive of mild colitis/typhilitisinvolving the right colon and cecum. No surrounding loculated fluid collections or free air. She was admitted for sepsis secondary to colitis, pancytopenia and GI bleeding. Oncology  recommended blood and platelet transfusions. GI consulted recommended to transfuse platelets and PRBC, hold colonoscopy until platelet count and hemoglobin improves.  Patient has received 4 units of packed red blood cells and 2 units of platelets.  Her hemoglobin has improved 8.9 and platelet count has improved to 72K.  GI bleeding has stopped,  nausea and vomiting has improved.  GI signed off.  GI states there is no indication for colonoscopy at this point. White cell count has improved , antibiotics switched to ciprofloxacin and Flagyl,   Lower GI bleeding has resolved. Patient is cleared from GI to be discharged on oral antibiotics.  Patient was cleared from oncology to be discharged and follow-up as an outpatient.  Patient's feels better and wants to be discharged.  Her hemoglobin at discharge 8.9 her platelet count 71,000.  Patient is being discharged home on ciprofloxacin and Flagyl for 7 more days to complete total 10-day treatment for colitis.  Discharge Diagnoses:  Principal Problem:   Sepsis ([place]) Active Problems:   AML (acute myeloblastic leukemia) ([place])   Colitis   Pancytopenia ([place])   Typhlitis  Sepsis secondary to colitis:Patient is presenting with complaints of abdominal pain and bloody diarrhea. She had a low-grade temperature. She was slightly tachycardic on arrival and blood pressure soft. Tachycardia has resolved and blood pressure improved after IV fluid bolus. Labs showing worsening neutropenia and anemia. FOBT positive. Lactic acid 3.6. CT abdomen pelvis showing findings suggestive of mild colitis/typhilitisinvolving the right colon and cecum. No surrounding loculated fluid collections or free air. -Continue Zosyn and IV fluid hydration. Morphine as needed for pain. Zofran as needed for nausea. Trend lactic acid. Blood culture x2 pending. Since patient is currently hemodynamically stable. -Blood cultures: 1 bottle shows coagulase-negative staph aureus which could be  contaminant. continue Zosyn for  now. C. difficile PCR and GI pathogen panel have also been ordered and currently pending. -GI consulted recommended patient is not stable for colonoscopy given thrombocytopenia and anemia. Transfuse PRBC and platelet until hemoglobin and platelets are stable. White cell count is improving, antibiotic changed to ciprofloxacin and Flagyl. GI signed off, no indication for colonoscopy at this point. Continue ciprofloxacin and Flagyl for 10 days.  Symptomatic anemia/ pancytopenia:Likely related to chemotherapy. Now worsened on labs -WBC count 0.8 (ANC 0.3), hemoglobin 6.1, and platelet count less than 5k.Oncology recommended blood and platelet transfusions. -Type and screen. 3 units PRBCs and 2 unit platelets ordered in the ED. Order posttransfusion CBC and continue to monitor closely.  -Oncology consulted recommended transfuse platelets count for less than 20,000. Hemoglobin 7.5 and platelet count 23 today. Oncology recommended keep hemoglobin above 8. GI recommended no plans for colonoscopy since hemoglobin is stable, denies any further GI bleeding.   Chest pain, dyspnea on exertion:  Resolved. Likely due to symptomatic anemia. Currently asymptomatic. EKG without acute ischemic changes. -Cardiac monitoring. Check high-sensitivity troponin level. Continue blood transfusions as mentioned above.  Hypokalemia: Resolved.Likely due to GI loss from diarrhea and home diuretic use. Potassium 2.8. EKG without acute changes. -Cardiac monitoring. Replete potassium. Check magnesium level and replete if low. Continue to monitor electrolytes. Hold diuretic at this time.  AML: Followed by oncology and currently on chemotherapy. -[name] consulted recommended transfuse platelets for count less than 20,000 transfuse PRBC to keep hemoglobin above 8. Follow  up [name] as an outpatient.  Hypertension Resumed Blood pressure medications.  Discharge  Instructions  Discharge Instructions    Call MD for:  difficulty breathing, headache or visual disturbances   Complete by: As directed    Call MD for:  persistant dizziness or light-headedness   Complete by: As directed    Call MD for:  persistant nausea and vomiting   Complete by: As directed    Call MD for:  severe uncontrolled pain   Complete by: As directed    Call MD for:  temperature >100.4   Complete by: As directed    Diet - low sodium heart healthy   Complete by: As directed    Discharge instructions   Complete by: As directed    Advised to follow-up with primary care physician in 1 week. Advised to follow-up with oncologist [name] as scheduled. Advised to take ciprofloxacin and Flagyl for 7 more days to complete 10-day course for typhlitis.   Increase activity slowly   Complete by: As directed      Allergies as of 08/03/2019      Reactions   Lisinopril Cough      Medication List    TAKE these medications   acyclovir 200 MG capsule Commonly known as: ZOVIRAX Take 400 mg by mouth 2 (two) times daily.   ciprofloxacin 750 MG tablet Commonly known as: CIPRO Take 1 tablet (750 mg total) by mouth 2 (two) times daily for 7 days.   cyclobenzaprine 5 MG tablet Commonly known as: FLEXERIL Take 1 tablet (5 mg total) by mouth 3 (three) times daily as needed for muscle spasms.   entecavir 0.5 MG tablet Commonly known as: BARACLUDE Take 0.5 mg by mouth daily.   escitalopram 10 MG tablet Commonly known as: LEXAPRO Take 10 mg by mouth daily.   hydrochlorothiazide 25 MG tablet Commonly known as: HYDRODIURIL Take 25 mg by mouth daily.   LORazepam 1 MG tablet Commonly known as: ATIVAN Take 1 mg by  mouth at bedtime as needed for anxiety or sleep.   losartan 25 MG tablet Commonly known as: Cozaar Take 1 tablet (25 mg total) by mouth daily.   metroNIDAZOLE 500 MG tablet Commonly known as: FLAGYL Take 1 tablet (500 mg total) by mouth every 8 (eight) hours.    multivitamin with minerals Tabs tablet Take 1 tablet by mouth daily.   ondansetron 4 MG tablet Commonly known as: ZOFRAN Take 1 tablet (4 mg total) by mouth every 6 (six) hours as needed for nausea.   polyethylene glycol 17 g packet Commonly known as: MIRALAX / GLYCOLAX Take 17 g by mouth daily as needed for moderate constipation or severe constipation.   senna-docusate 8.6-50 MG tablet Commonly known as: Senokot-S Take 1 tablet by mouth at bedtime.   traMADol 50 MG tablet Commonly known as: ULTRAM Take 1 tablet (50 mg total) by mouth every 8 (eight) hours as needed (mild pain).   traZODone 50 MG tablet Commonly known as: DESYREL Take 50 mg by mouth at bedtime as needed for sleep.       Follow-up Information    Ladell Pier, MD Follow up in 1 week(s).   Specialty: Internal Medicine Contact information: [place] [address] [phone]        Ledell Peoples IV, MD Follow up in 1 week(s).   Specialty: Hematology and Oncology Contact information: [place] [address] [place] [address] [phone]              Allergies  Allergen Reactions  . Lisinopril Cough    Consultations:  Oncology, Gastroenterology.   Procedures/Studies: CT ABDOMEN PELVIS W CONTRAST  Result Date: 07/29/2019 CLINICAL DATA:  History of leukemia, currently on chemotherapy, bloody diarrhea EXAM: CT ABDOMEN AND PELVIS WITH CONTRAST TECHNIQUE: Multidetector CT imaging of the abdomen and pelvis was performed using the standard protocol following bolus administration of intravenous contrast. CONTRAST:  168mL OMNIPAQUE IOHEXOL 300 MG/ML  SOLN COMPARISON:  None. FINDINGS: Lower chest: The visualized heart size within normal limits. No pericardial fluid/thickening. No hiatal hernia. Streaky atelectasis or scarring seen at the right lung base. Hepatobiliary: There is a 1.7 cm low-density lesion seen in the posterior right liver lobe which partially fills in on delayed  images, likely hemangioma. Main portal vein is patent. No evidence of calcified gallstones, gallbladder wall thickening or biliary dilatation. Pancreas: Unremarkable. No pancreatic ductal dilatation or surrounding inflammatory changes. Spleen: Normal in size without focal abnormality. Adrenals/Urinary Tract: Both adrenal glands appear normal. The kidneys and collecting system appear normal without evidence of urinary tract calculus or hydronephrosis. Bladder is unremarkable. Stomach/Bowel: The patient is status post Roux-en-Y gastric bypass. The stomach and small bowel otherwise unremarkable. There appears to be mild wall thickening seen around the right colon/cecum with mild fat stranding changes. No loculated fluid collections however are noted. The appendix is unremarkable. The remainder of the colon is unremarkable. Vascular/Lymphatic: There are no enlarged mesenteric, retroperitoneal, or pelvic lymph nodes. Scattered aortic atherosclerotic calcifications are seen without aneurysmal dilatation. Reproductive: The uterus and adnexa are unremarkable. Other: No evidence of abdominal wall mass or hernia. Musculoskeletal: No acute or significant osseous findings. IMPRESSION: Findings which could be suggestive of mild colitis/typhlitis involving the right colon and cecum. No surrounding loculated fluid collections or free air. Aortic Atherosclerosis (ICD10-I70.0). Electronically Signed   By: Prudencio Pair M.D.   On: 07/29/2019 21:28   DG Chest Port 1 View  Result Date: 07/29/2019 CLINICAL DATA:  Weakness EXAM: PORTABLE  CHEST 1 VIEW COMPARISON:  06/30/2019 FINDINGS: The heart size and mediastinal contours are within normal limits. Both lungs are clear. The visualized skeletal structures are unremarkable. IMPRESSION: No active disease. Electronically Signed   By: Donavan Foil M.D.   On: 07/29/2019 16:23    (Echo, Carotid, EGD, Colonoscopy, ERCP)    Subjective: Patient was seen and examined at bedside.  No  overnight events.  Patient reports feeling much better.  Her hemoglobin and platelet count has improved.  she denies any further GI bleeding.  Wants to be discharged home.   Discharge Exam: Vitals:   08/02/19 1648 08/03/19 0032  BP: 116/73 (!) 132/78  Pulse: 80 92  Resp: 16 15  Temp: 97.9 F (36.6 C) 99.1 F (37.3 C)  SpO2: 99% 97%   Vitals:   08/02/19 1348 08/02/19 1403 08/02/19 1648 08/03/19 0032  BP: 111/67 125/74 116/73 (!) 132/78  Pulse: 80 82 80 92  Resp: 16 16 16 15   Temp: 97.8 F (36.6 C) 97.6 F (36.4 C) 97.9 F (36.6 C) 99.1 F (37.3 C)  TempSrc: Oral Oral Oral   SpO2: 100% 98% 99% 97%  Weight:      Height:        General: Pt is alert, awake, not in acute distress Cardiovascular: RRR, S1/S2 +, no rubs, no gallops Respiratory: CTA bilaterally, no wheezing, no rhonchi Abdominal: Soft, NT, ND, bowel sounds + Extremities: no edema, no cyanosis    The results of significant diagnostics from this hospitalization (including imaging, microbiology, ancillary and laboratory) are listed below for reference.     Microbiology: Recent Results (from the past 240 hour(s))  Blood culture (routine x 2)     Status: Abnormal   Collection Time: 07/29/19  4:51 PM   Specimen: BLOOD  Result Value Ref Range Status   Specimen Description   Final    BLOOD RIGHT ANTECUBITAL Performed at [facility], [place] [address], [place], [place] [address]    Special Requests   Final    BOTTLES DRAWN AEROBIC AND ANAEROBIC Blood Culture adequate volume Performed at [facility] [address], [place], [place] [address]    Culture  Setup Time   Final    GRAM POSITIVE COCCI IN CLUSTERS AEROBIC BOTTLE ONLY Organism ID to follow CRITICAL RESULT CALLED TO, READ BACK BY AND VERIFIED WITH: PHARMD LIANNE POINDEXTER BY MESSAN [place] ON 07/31/2019 AT 0100    Culture (A)  Final    STAPHYLOCOCCUS SPECIES (COAGULASE NEGATIVE) THE SIGNIFICANCE OF ISOLATING THIS ORGANISM  FROM A SINGLE SET OF BLOOD CULTURES WHEN MULTIPLE SETS ARE DRAWN IS UNCERTAIN. PLEASE NOTIFY THE MICROBIOLOGY DEPARTMENT WITHIN ONE WEEK IF SPECIATION AND SENSITIVITIES ARE REQUIRED. Performed at [facility], [place] [address] [place], [place] [address]    Report Status 08/01/2019 FINAL  Final  Blood Culture ID Panel (Reflexed)     Status: Abnormal   Collection Time: 07/29/19  4:51 PM  Result Value Ref Range Status   Enterococcus species NOT DETECTED NOT DETECTED Final   Listeria monocytogenes NOT DETECTED NOT DETECTED Final   Staphylococcus species DETECTED (A) NOT DETECTED Final    Comment: Methicillin (oxacillin) susceptible coagulase negative staphylococcus. Possible blood culture contaminant (unless isolated from more than one blood culture draw or clinical case suggests pathogenicity). No antibiotic treatment is indicated for blood  culture contaminants. CRITICAL RESULT CALLED TO, READ BACK BY AND VERIFIED WITH: PHARMD LIANNE POINDEXTER BY [place] ON 07/31/2019 AT 0100  Staphylococcus aureus (BCID) NOT DETECTED NOT DETECTED Final   Methicillin resistance NOT DETECTED NOT DETECTED Final   Streptococcus species NOT DETECTED NOT DETECTED Final   Streptococcus agalactiae NOT DETECTED NOT DETECTED Final   Streptococcus pneumoniae NOT DETECTED NOT DETECTED Final   Streptococcus pyogenes NOT DETECTED NOT DETECTED Final   Acinetobacter baumannii NOT DETECTED NOT DETECTED Final   Enterobacteriaceae species NOT DETECTED NOT DETECTED Final   Enterobacter cloacae complex NOT DETECTED NOT DETECTED Final   Escherichia coli NOT DETECTED NOT DETECTED Final   Klebsiella oxytoca NOT DETECTED NOT DETECTED Final   Klebsiella pneumoniae NOT DETECTED NOT DETECTED Final   Proteus species NOT DETECTED NOT DETECTED Final   Serratia marcescens NOT DETECTED NOT DETECTED Final   Haemophilus influenzae NOT DETECTED NOT DETECTED Final   Neisseria meningitidis NOT DETECTED NOT DETECTED Final    Pseudomonas aeruginosa NOT DETECTED NOT DETECTED Final   Candida albicans NOT DETECTED NOT DETECTED Final   Candida glabrata NOT DETECTED NOT DETECTED Final   Candida krusei NOT DETECTED NOT DETECTED Final   Candida parapsilosis NOT DETECTED NOT DETECTED Final   Candida tropicalis NOT DETECTED NOT DETECTED Final    Comment: Performed at [facility], [place] [address] [place], [place] [address]  Blood culture (routine x 2)     Status: None (Preliminary result)   Collection Time: 07/30/19 12:29 AM   Specimen: BLOOD  Result Value Ref Range Status   Specimen Description   Final    BLOOD LEFT ANTECUBITAL Performed at [facility] [address], [place], [place] [address]    Special Requests   Final    BOTTLES DRAWN AEROBIC AND ANAEROBIC Blood Culture adequate volume Performed at [facility] [address], [place], [place] [address]    Culture   Final    NO GROWTH 4 DAYS Performed at [facility], [place] [address], [place], [place] [address]    Report Status PENDING  Incomplete  SARS Coronavirus 2 by RT PCR (hospital order, performed in [facility] hospital lab) Nasopharyngeal Nasopharyngeal Swab     Status: None   Collection Time: 07/30/19  8:51 AM   Specimen: Nasopharyngeal Swab  Result Value Ref Range Status   SARS Coronavirus 2 NEGATIVE NEGATIVE Final    Comment: (NOTE) SARS-CoV-2 target nucleic acids are NOT DETECTED.  The SARS-CoV-2 RNA is generally detectable in upper and lower respiratory specimens during the acute phase of infection. The lowest concentration of SARS-CoV-2 viral copies this assay can detect is 250 copies / mL. A negative result does not preclude SARS-CoV-2 infection and should not be used as the sole basis for treatment or other patient management decisions.  A negative result may occur with improper specimen collection / handling, submission of specimen other than nasopharyngeal swab, presence of viral  mutation(s) within the areas targeted by this assay, and inadequate number of viral copies (<250 copies / mL). A negative result must be combined with clinical observations, patient history, and epidemiological information.  Fact Sheet for Patients:   StrictlyIdeas.no  Fact Sheet for Healthcare Providers: BankingDealers.co.za  This test is not yet approved or  cleared by the [place] FDA and has been authorized for detection and/or diagnosis of SARS-CoV-2 by FDA under an Emergency Use Authorization (EUA).  This EUA will remain in effect (meaning this test can be used) for the duration of the COVID-19 declaration under Section 564(b)(1) of the Act, 21 U.S.C. section 360bbb-3(b)(1), unless the authorization is  terminated or revoked sooner.  Performed at [facility], [place] [address], [place], [place] [address]      Labs: BNP (last 3 results) No results for input(s): BNP in the last 8760 hours. Basic Metabolic Panel: Recent Labs  Lab 07/30/19 0900 07/31/19 0344 08/01/19 0420 08/02/19 0323 08/03/19 0350  NA 135 137 136 132* 134*  K 2.8* 4.1 3.9 3.4* 3.4*  CL 101 105 104 102 103  CO2 24 24 25 24 24   GLUCOSE 92 94 94 97 127*  BUN 13 8 <5* <5* <5*  CREATININE 0.38* 0.40* 0.48 0.42* 0.42*  CALCIUM 8.3* 8.5* 8.8* 8.3* 8.6*  MG 2.0 1.8 1.7 1.6* 1.9  PHOS 3.5 3.1 3.5 3.8  --    Liver Function Tests: Recent Labs  Lab 07/29/19 1449 07/30/19 0900 07/31/19 0344 08/01/19 0420  AST 44* 37 24 21  ALT 35 32 25 22  ALKPHOS 61 62 56 68  BILITOT 1.0 3.0* 1.3* 1.3*  PROT 5.3* 5.8* 5.0* 5.3*  ALBUMIN 2.6* 2.9* 2.5* 2.8*   Recent Labs  Lab 07/29/19 1449  LIPASE 14   No results for input(s): AMMONIA in the last 168 hours. CBC: Recent Labs  Lab 07/29/19 1449 07/30/19 0029 07/30/19 0847 07/30/19 0847 07/30/19 2157 07/31/19 0344 08/01/19 0420 08/02/19 0323 08/03/19 0350  WBC 0.8*   < > 3.7*   < > 5.7  5.1 5.7 6.3 7.3  NEUTROABS 0.3*  --  2.1  --  3.5  --   --   --   --   HGB 6.1*   < > 6.6*   < > 8.3* 7.5* 7.7* 7.4* 8.9*  HCT 18.6*   < > 19.4*   < > 24.2* 22.0* 23.4* 22.8* 27.1*  MCV 100.0   < > 93.3   < > 91.7 92.1 94.4 96.6 94.4  PLT <5*   < > 13*   < > 28* 23* 28* 41* 72*   < > = values in this interval not displayed.   Cardiac Enzymes: No results for input(s): CKTOTAL, CKMB, CKMBINDEX, TROPONINI in the last 168 hours. BNP: Invalid input(s): POCBNP CBG: No results for input(s): GLUCAP in the last 168 hours. D-Dimer No results for input(s): DDIMER in the last 72 hours. Hgb A1c No results for input(s): HGBA1C in the last 72 hours. Lipid Profile No results for input(s): CHOL, HDL, LDLCALC, TRIG, CHOLHDL, LDLDIRECT in the last 72 hours. Thyroid function studies No results for input(s): TSH, T4TOTAL, T3FREE, THYROIDAB in the last 72 hours.  Invalid input(s): FREET3 Anemia work up No results for input(s): VITAMINB12, FOLATE, FERRITIN, TIBC, IRON, RETICCTPCT in the last 72 hours. Urinalysis    Component Value Date/Time   COLORURINE YELLOW 07/01/2019 0130   APPEARANCEUR CLEAR 07/01/2019 0130   LABSPEC 1.006 07/01/2019 0130   PHURINE 7.0 07/01/2019 0130   GLUCOSEU NEGATIVE 07/01/2019 0130   HGBUR NEGATIVE 07/01/2019 0130   BILIRUBINUR NEGATIVE 07/01/2019 0130   KETONESUR NEGATIVE 07/01/2019 0130   PROTEINUR NEGATIVE 07/01/2019 0130   NITRITE NEGATIVE 07/01/2019 0130   LEUKOCYTESUR NEGATIVE 07/01/2019 0130   Sepsis Labs Invalid input(s): PROCALCITONIN,  WBC,  LACTICIDVEN Microbiology Recent Results (from the past 240 hour(s))  Blood culture (routine x 2)     Status: Abnormal   Collection Time: 07/29/19  4:51 PM   Specimen: BLOOD  Result Value Ref Range Status   Specimen Description   Final    BLOOD RIGHT ANTECUBITAL Performed at [facility], [place]  [address] [place], [place] [address]    Special Requests   Final    BOTTLES DRAWN AEROBIC AND ANAEROBIC Blood  Culture adequate volume Performed at [facility] [address], [place], [place] [address]    Culture  Setup Time   Final    GRAM POSITIVE COCCI IN CLUSTERS AEROBIC BOTTLE ONLY Organism ID to follow CRITICAL RESULT CALLED TO, READ BACK BY AND VERIFIED WITH: PHARMD LIANNE POINDEXTER BY MESSAN [place] ON 07/31/2019 AT 0100    Culture (A)  Final    STAPHYLOCOCCUS SPECIES (COAGULASE NEGATIVE) THE SIGNIFICANCE OF ISOLATING THIS ORGANISM FROM A SINGLE SET OF BLOOD CULTURES WHEN MULTIPLE SETS ARE DRAWN IS UNCERTAIN. PLEASE NOTIFY THE MICROBIOLOGY DEPARTMENT WITHIN ONE WEEK IF SPECIATION AND SENSITIVITIES ARE REQUIRED. Performed at [facility], [place] [address], [place], [place] [address]    Report Status 08/01/2019 FINAL  Final  Blood Culture ID Panel (Reflexed)     Status: Abnormal   Collection Time: 07/29/19  4:51 PM  Result Value Ref Range Status   Enterococcus species NOT DETECTED NOT DETECTED Final   Listeria monocytogenes NOT DETECTED NOT DETECTED Final   Staphylococcus species DETECTED (A) NOT DETECTED Final    Comment: Methicillin (oxacillin) susceptible coagulase negative staphylococcus. Possible blood culture contaminant (unless isolated from more than one blood culture draw or clinical case suggests pathogenicity). No antibiotic treatment is indicated for blood  culture contaminants. CRITICAL RESULT CALLED TO, READ BACK BY AND VERIFIED WITH: PHARMD LIANNE POINDEXTER BY [place] ON 07/31/2019 AT 0100    Staphylococcus aureus (BCID) NOT DETECTED NOT DETECTED Final   Methicillin resistance NOT DETECTED NOT DETECTED Final   Streptococcus species NOT DETECTED NOT DETECTED Final   Streptococcus agalactiae NOT DETECTED NOT DETECTED Final   Streptococcus pneumoniae NOT DETECTED NOT DETECTED Final   Streptococcus pyogenes NOT DETECTED NOT DETECTED Final   Acinetobacter baumannii NOT DETECTED NOT DETECTED Final   Enterobacteriaceae species NOT DETECTED  NOT DETECTED Final   Enterobacter cloacae complex NOT DETECTED NOT DETECTED Final   Escherichia coli NOT DETECTED NOT DETECTED Final   Klebsiella oxytoca NOT DETECTED NOT DETECTED Final   Klebsiella pneumoniae NOT DETECTED NOT DETECTED Final   Proteus species NOT DETECTED NOT DETECTED Final   Serratia marcescens NOT DETECTED NOT DETECTED Final   Haemophilus influenzae NOT DETECTED NOT DETECTED Final   Neisseria meningitidis NOT DETECTED NOT DETECTED Final   Pseudomonas aeruginosa NOT DETECTED NOT DETECTED Final   Candida albicans NOT DETECTED NOT DETECTED Final   Candida glabrata NOT DETECTED NOT DETECTED Final   Candida krusei NOT DETECTED NOT DETECTED Final   Candida parapsilosis NOT DETECTED NOT DETECTED Final   Candida tropicalis NOT DETECTED NOT DETECTED Final    Comment: Performed at [facility], [place]. [address] [place], [place] [address]  Blood culture (routine x 2)     Status: None (Preliminary result)   Collection Time: 07/30/19 12:29 AM   Specimen: BLOOD  Result Value Ref Range Status   Specimen Description   Final    BLOOD LEFT ANTECUBITAL Performed at [facility] [address] [place], [place] [address]    Special Requests   Final    BOTTLES DRAWN AEROBIC AND ANAEROBIC Blood Culture adequate volume Performed at [facility] [address], [place], [place] [address]    Culture   Final    NO GROWTH 4 DAYS Performed at [facility], [place] [address] [place], [place] [address]  Report Status PENDING  Incomplete  SARS Coronavirus 2 by RT PCR (hospital order, performed in [facility] hospital lab) Nasopharyngeal Nasopharyngeal Swab     Status: None   Collection Time: 07/30/19  8:51 AM   Specimen: Nasopharyngeal Swab  Result Value Ref Range Status   SARS Coronavirus 2 NEGATIVE NEGATIVE Final    Comment: (NOTE) SARS-CoV-2 target nucleic acids are NOT DETECTED.  The SARS-CoV-2 RNA is generally detectable in upper  and lower respiratory specimens during the acute phase of infection. The lowest concentration of SARS-CoV-2 viral copies this assay can detect is 250 copies / mL. A negative result does not preclude SARS-CoV-2 infection and should not be used as the sole basis for treatment or other patient management decisions.  A negative result may occur with improper specimen collection / handling, submission of specimen other than nasopharyngeal swab, presence of viral mutation(s) within the areas targeted by this assay, and inadequate number of viral copies (<250 copies / mL). A negative result must be combined with clinical observations, patient history, and epidemiological information.  Fact Sheet for Patients:   StrictlyIdeas.no  Fact Sheet for Healthcare Providers: BankingDealers.co.za  This test is not yet approved or  cleared by the [place] FDA and has been authorized for detection and/or diagnosis of SARS-CoV-2 by FDA under an Emergency Use Authorization (EUA).  This EUA will remain in effect (meaning this test can be used) for the duration of the COVID-19 declaration under Section 564(b)(1) of the Act, 21 U.S.C. section 360bbb-3(b)(1), unless the authorization is terminated or revoked sooner.  Performed at [facility], [place] [address] [place], [place] [address]      Time coordinating discharge: Over 30 minutes  SIGNED:   Shawna Clamp, MD  [facility] 08/03/2019, 10:33 AM   If 7PM-7AM, please contact night-coverage [email or web address]

## 2019-08-04 LAB — CULTURE, BLOOD (ROUTINE X 2)
Culture: NO GROWTH
Special Requests: ADEQUATE

## 2019-08-05 ENCOUNTER — Other Ambulatory Visit: Payer: Self-pay

## 2019-08-05 ENCOUNTER — Inpatient Hospital Stay: Payer: Medicaid Other | Attending: Hematology and Oncology

## 2019-08-05 ENCOUNTER — Telehealth: Payer: Self-pay | Admitting: *Deleted

## 2019-08-05 ENCOUNTER — Telehealth: Payer: Self-pay

## 2019-08-05 DIAGNOSIS — C9200 Acute myeloblastic leukemia, not having achieved remission: Secondary | ICD-10-CM | POA: Diagnosis present

## 2019-08-05 DIAGNOSIS — Z5189 Encounter for other specified aftercare: Secondary | ICD-10-CM | POA: Insufficient documentation

## 2019-08-05 DIAGNOSIS — C9501 Acute leukemia of unspecified cell type, in remission: Secondary | ICD-10-CM

## 2019-08-05 LAB — CMP (CANCER CENTER ONLY)
ALT: 13 U/L (ref 0–44)
AST: 21 U/L (ref 15–41)
Albumin: 2.9 g/dL — ABNORMAL LOW (ref 3.5–5.0)
Alkaline Phosphatase: 102 U/L (ref 38–126)
Anion gap: 10 (ref 5–15)
BUN: <4 mg/dL — ABNORMAL LOW (ref 6–20)
CO2: 21 mmol/L — ABNORMAL LOW (ref 22–32)
Calcium: 9.7 mg/dL (ref 8.9–10.3)
Chloride: 111 mmol/L (ref 98–111)
Creatinine: 0.59 mg/dL (ref 0.44–1.00)
GFR, Est AFR Am: >60 mL/min (ref >60)
GFR, Estimated: >60 mL/min (ref >60)
Glucose, Bld: 99 mg/dL (ref 70–99)
Potassium: 3.3 mmol/L — ABNORMAL LOW (ref 3.5–5.1)
Sodium: 142 mmol/L (ref 135–145)
Total Bilirubin: 0.5 mg/dL (ref 0.3–1.2)
Total Protein: 6 g/dL — ABNORMAL LOW (ref 6.5–8.1)

## 2019-08-05 LAB — CBC WITH DIFFERENTIAL (CANCER CENTER ONLY)
Abs Immature Granulocytes: 0.19 10*3/uL — ABNORMAL HIGH (ref 0.00–0.07)
Basophils Absolute: 0 10*3/uL (ref 0.0–0.1)
Basophils Relative: 0 %
Eosinophils Absolute: 0 10*3/uL (ref 0.0–0.5)
Eosinophils Relative: 0 %
HCT: 30.2 % — ABNORMAL LOW (ref 36.0–46.0)
Hemoglobin: 9.9 g/dL — ABNORMAL LOW (ref 12.0–15.0)
Immature Granulocytes: 3 %
Lymphocytes Relative: 14 %
Lymphs Abs: 1 10*3/uL (ref 0.7–4.0)
MCH: 31.5 pg (ref 26.0–34.0)
MCHC: 32.8 g/dL (ref 30.0–36.0)
MCV: 96.2 fL (ref 80.0–100.0)
Monocytes Absolute: 1.6 10*3/uL — ABNORMAL HIGH (ref 0.1–1.0)
Monocytes Relative: 23 %
Neutro Abs: 4.2 10*3/uL (ref 1.7–7.7)
Neutrophils Relative %: 60 %
Platelet Count: 134 10*3/uL — ABNORMAL LOW (ref 150–400)
RBC: 3.14 MIL/uL — ABNORMAL LOW (ref 3.87–5.11)
RDW: 18.6 % — ABNORMAL HIGH (ref 11.5–15.5)
WBC Count: 7 10*3/uL (ref 4.0–10.5)
nRBC: 1.1 % — ABNORMAL HIGH (ref 0.0–0.2)

## 2019-08-05 LAB — SAMPLE TO BLOOD BANK

## 2019-08-05 NOTE — Telephone Encounter (Signed)
T patient with results from today's labs. Spoke with her and informed her that her HGB was steadily improving and was 9.9 today with platelets of  134. Kelly Roberson voiced understanding. She states she has no fever, no rectal bleeding. She was discharged from the hospital on 08/03/19 (Saturday) but has not picked up her new prescriptions yet-for antibiotics.  Encouraged to pick those up today as she needs to complete her antibiotics. She voiced understanding and states she feels well enough to go and get these today.  She is aware of her appt for repeat labs on 08/08/19  [name] made aware of lab results.

## 2019-08-05 NOTE — Telephone Encounter (Signed)
Transition Care Management Follow-up Telephone Call  Date of discharge and from where: 08/03/2019, [facility]   How have you been since you were released from the hospital? She verbalized frustrations about her medical issues and all of the symptoms she is trying to deal with.  It has been an exhausting ordeal for her and it is not over.  She has been overwhelmed with all of her medical appointments and side effects from her treatments. She also reported that she did not have a good experience at [facility] and she has the phone number to call to report her concerns and she said she would call the hospital at a later time   She is living on her own.  Her 2 sons are not living in this area.   She has an appointment at the lab this afternoon. She also explained that she has an appointment at [facility] 08/19/2019 and will be admitted to the hospital for treatment at that time.  She did not want to schedule an appointment with Dr Wynetta Emery at this time. She also did not want to speak to a SW right now.     She said that she has a rollator and shower chair but could use a raised toilet seat.  She said she has all of her medications.    She also is requesting PCS.  Explained to her that Dr Wynetta Emery has not seen her in past 90 days and in order to submit the [facility] referral, she would need to be seen by Dr Wynetta Emery. This CM informed her that any of the providers that have seen her recently can submit the PCS form.     She had family planning medicaid in the past and had applied for regular medicaid.  She explained that she was told she was " approved."  Informed her that as per Epic, she has [facility] managed medicaid and she said she was not aware of that. This CM explained that as of 07/04/2019, [place] has adopted managed  Medicaid plans and she may have been assigned to this plan.  Provided her with the phone number for [place] and encouraged her to call to check on her benefits and inquire about  changing plans if she is interested.

## 2019-08-08 ENCOUNTER — Other Ambulatory Visit: Payer: Self-pay

## 2019-08-08 ENCOUNTER — Inpatient Hospital Stay: Payer: Medicaid Other

## 2019-08-08 DIAGNOSIS — C9501 Acute leukemia of unspecified cell type, in remission: Secondary | ICD-10-CM

## 2019-08-08 DIAGNOSIS — C9200 Acute myeloblastic leukemia, not having achieved remission: Secondary | ICD-10-CM | POA: Diagnosis not present

## 2019-08-08 LAB — CMP (CANCER CENTER ONLY)
ALT: 9 U/L (ref 0–44)
AST: 22 U/L (ref 15–41)
Albumin: 3 g/dL — ABNORMAL LOW (ref 3.5–5.0)
Alkaline Phosphatase: 101 U/L (ref 38–126)
Anion gap: 10 (ref 5–15)
BUN: 5 mg/dL — ABNORMAL LOW (ref 6–20)
CO2: 24 mmol/L (ref 22–32)
Calcium: 9.5 mg/dL (ref 8.9–10.3)
Chloride: 104 mmol/L (ref 98–111)
Creatinine: 0.64 mg/dL (ref 0.44–1.00)
GFR, Est AFR Am: >60 mL/min (ref >60)
GFR, Estimated: >60 mL/min (ref >60)
Glucose, Bld: 123 mg/dL — ABNORMAL HIGH (ref 70–99)
Potassium: 3.8 mmol/L (ref 3.5–5.1)
Sodium: 138 mmol/L (ref 135–145)
Total Bilirubin: 0.7 mg/dL (ref 0.3–1.2)
Total Protein: 5.9 g/dL — ABNORMAL LOW (ref 6.5–8.1)

## 2019-08-08 LAB — CBC WITH DIFFERENTIAL (CANCER CENTER ONLY)
Abs Immature Granulocytes: 0.06 10*3/uL (ref 0.00–0.07)
Basophils Absolute: 0.1 10*3/uL (ref 0.0–0.1)
Basophils Relative: 1 %
Eosinophils Absolute: 0 10*3/uL (ref 0.0–0.5)
Eosinophils Relative: 0 %
HCT: 31.1 % — ABNORMAL LOW (ref 36.0–46.0)
Hemoglobin: 10.1 g/dL — ABNORMAL LOW (ref 12.0–15.0)
Immature Granulocytes: 1 %
Lymphocytes Relative: 10 %
Lymphs Abs: 0.6 10*3/uL — ABNORMAL LOW (ref 0.7–4.0)
MCH: 32.3 pg (ref 26.0–34.0)
MCHC: 32.5 g/dL (ref 30.0–36.0)
MCV: 99.4 fL (ref 80.0–100.0)
Monocytes Absolute: 0.8 10*3/uL (ref 0.1–1.0)
Monocytes Relative: 12 %
Neutro Abs: 4.7 10*3/uL (ref 1.7–7.7)
Neutrophils Relative %: 76 %
Platelet Count: 181 10*3/uL (ref 150–400)
RBC: 3.13 MIL/uL — ABNORMAL LOW (ref 3.87–5.11)
RDW: 20.1 % — ABNORMAL HIGH (ref 11.5–15.5)
WBC Count: 6.2 10*3/uL (ref 4.0–10.5)
nRBC: 0.6 % — ABNORMAL HIGH (ref 0.0–0.2)

## 2019-08-08 LAB — SAMPLE TO BLOOD BANK

## 2019-08-12 ENCOUNTER — Other Ambulatory Visit: Payer: Self-pay

## 2019-08-12 ENCOUNTER — Inpatient Hospital Stay: Payer: Medicaid Other

## 2019-08-12 DIAGNOSIS — C9501 Acute leukemia of unspecified cell type, in remission: Secondary | ICD-10-CM

## 2019-08-12 DIAGNOSIS — C9200 Acute myeloblastic leukemia, not having achieved remission: Secondary | ICD-10-CM | POA: Diagnosis not present

## 2019-08-12 LAB — CBC WITH DIFFERENTIAL (CANCER CENTER ONLY)
Abs Immature Granulocytes: 0.01 10*3/uL (ref 0.00–0.07)
Basophils Absolute: 0 10*3/uL (ref 0.0–0.1)
Basophils Relative: 1 %
Eosinophils Absolute: 0 10*3/uL (ref 0.0–0.5)
Eosinophils Relative: 0 %
HCT: 33.4 % — ABNORMAL LOW (ref 36.0–46.0)
Hemoglobin: 10.6 g/dL — ABNORMAL LOW (ref 12.0–15.0)
Immature Granulocytes: 0 %
Lymphocytes Relative: 28 %
Lymphs Abs: 1 10*3/uL (ref 0.7–4.0)
MCH: 32.6 pg (ref 26.0–34.0)
MCHC: 31.7 g/dL (ref 30.0–36.0)
MCV: 102.8 fL — ABNORMAL HIGH (ref 80.0–100.0)
Monocytes Absolute: 0.8 10*3/uL (ref 0.1–1.0)
Monocytes Relative: 21 %
Neutro Abs: 1.8 10*3/uL (ref 1.7–7.7)
Neutrophils Relative %: 50 %
Platelet Count: 173 10*3/uL (ref 150–400)
RBC: 3.25 MIL/uL — ABNORMAL LOW (ref 3.87–5.11)
RDW: 22 % — ABNORMAL HIGH (ref 11.5–15.5)
WBC Count: 3.7 10*3/uL — ABNORMAL LOW (ref 4.0–10.5)
nRBC: 0.8 % — ABNORMAL HIGH (ref 0.0–0.2)

## 2019-08-12 LAB — SAMPLE TO BLOOD BANK

## 2019-08-12 LAB — CMP (CANCER CENTER ONLY)
ALT: 10 U/L (ref 0–44)
AST: 19 U/L (ref 15–41)
Albumin: 3 g/dL — ABNORMAL LOW (ref 3.5–5.0)
Alkaline Phosphatase: 103 U/L (ref 38–126)
Anion gap: 9 (ref 5–15)
BUN: 6 mg/dL (ref 6–20)
CO2: 25 mmol/L (ref 22–32)
Calcium: 10 mg/dL (ref 8.9–10.3)
Chloride: 106 mmol/L (ref 98–111)
Creatinine: 0.68 mg/dL (ref 0.44–1.00)
GFR, Est AFR Am: >60 mL/min (ref >60)
GFR, Estimated: >60 mL/min (ref >60)
Glucose, Bld: 118 mg/dL — ABNORMAL HIGH (ref 70–99)
Potassium: 4.1 mmol/L (ref 3.5–5.1)
Sodium: 140 mmol/L (ref 135–145)
Total Bilirubin: 0.8 mg/dL (ref 0.3–1.2)
Total Protein: 6.1 g/dL — ABNORMAL LOW (ref 6.5–8.1)

## 2019-08-15 ENCOUNTER — Inpatient Hospital Stay: Payer: Medicaid Other

## 2019-08-15 ENCOUNTER — Telehealth: Payer: Self-pay | Admitting: *Deleted

## 2019-08-15 NOTE — Telephone Encounter (Signed)
Received call from patient. She is requesting to skip her lab appt today. Her labs were stable on Monday and she feels well. No rectal bleeding, no fevers, chills etc.  She is due back to [facility] on Monday for her next consolidation treatment.  Spoke with [name]. He is ok with her not getting labs done today.  Shared this with patient.  She expressed her gratitude for being able to skip today.

## 2019-08-30 ENCOUNTER — Other Ambulatory Visit: Payer: Self-pay | Admitting: *Deleted

## 2019-08-30 ENCOUNTER — Telehealth: Payer: Self-pay | Admitting: Hematology and Oncology

## 2019-08-30 DIAGNOSIS — T451X5A Adverse effect of antineoplastic and immunosuppressive drugs, initial encounter: Secondary | ICD-10-CM

## 2019-08-30 DIAGNOSIS — C9201 Acute myeloblastic leukemia, in remission: Secondary | ICD-10-CM

## 2019-08-30 NOTE — Telephone Encounter (Signed)
Scheduled lab and inj appt per 8/27 sch msg - left message for patient with appt date and time. Waiting on response from Charge for 9/2 appts

## 2019-09-02 ENCOUNTER — Telehealth: Payer: Self-pay | Admitting: *Deleted

## 2019-09-02 NOTE — Telephone Encounter (Signed)
Received call from Nani Skillern, [place] for patient while she is an in-patient @ [facility]. He is calling to confirm her appts for this week. 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 Advised that her appts have been set for this week for labs and her Neulasta injection. Provided appt dates and times so he can share with Helene Kelp.

## 2019-09-03 ENCOUNTER — Other Ambulatory Visit: Payer: Self-pay

## 2019-09-03 ENCOUNTER — Inpatient Hospital Stay: Payer: Medicaid Other

## 2019-09-03 ENCOUNTER — Other Ambulatory Visit: Payer: Self-pay | Admitting: Hematology and Oncology

## 2019-09-03 VITALS — BP 126/79 | HR 60 | Resp 16

## 2019-09-03 DIAGNOSIS — C9201 Acute myeloblastic leukemia, in remission: Secondary | ICD-10-CM

## 2019-09-03 DIAGNOSIS — C9501 Acute leukemia of unspecified cell type, in remission: Secondary | ICD-10-CM

## 2019-09-03 DIAGNOSIS — T451X5A Adverse effect of antineoplastic and immunosuppressive drugs, initial encounter: Secondary | ICD-10-CM

## 2019-09-03 DIAGNOSIS — C9200 Acute myeloblastic leukemia, not having achieved remission: Secondary | ICD-10-CM | POA: Diagnosis not present

## 2019-09-03 LAB — CBC WITH DIFFERENTIAL (CANCER CENTER ONLY)
Abs Immature Granulocytes: 0.01 10*3/uL (ref 0.00–0.07)
Basophils Absolute: 0 10*3/uL (ref 0.0–0.1)
Basophils Relative: 1 %
Eosinophils Absolute: 0 10*3/uL (ref 0.0–0.5)
Eosinophils Relative: 0 %
HCT: 37.5 % (ref 36.0–46.0)
Hemoglobin: 11.8 g/dL — ABNORMAL LOW (ref 12.0–15.0)
Immature Granulocytes: 0 %
Lymphocytes Relative: 15 %
Lymphs Abs: 0.4 10*3/uL — ABNORMAL LOW (ref 0.7–4.0)
MCH: 33 pg (ref 26.0–34.0)
MCHC: 31.5 g/dL (ref 30.0–36.0)
MCV: 104.7 fL — ABNORMAL HIGH (ref 80.0–100.0)
Monocytes Absolute: 0 10*3/uL — ABNORMAL LOW (ref 0.1–1.0)
Monocytes Relative: 0 %
Neutro Abs: 2.1 10*3/uL (ref 1.7–7.7)
Neutrophils Relative %: 84 %
Platelet Count: 69 10*3/uL — ABNORMAL LOW (ref 150–400)
RBC: 3.58 MIL/uL — ABNORMAL LOW (ref 3.87–5.11)
RDW: 17.7 % — ABNORMAL HIGH (ref 11.5–15.5)
WBC Count: 2.5 10*3/uL — ABNORMAL LOW (ref 4.0–10.5)
nRBC: 0 % (ref 0.0–0.2)

## 2019-09-03 LAB — CMP (CANCER CENTER ONLY)
ALT: 28 U/L (ref 0–44)
AST: 42 U/L — ABNORMAL HIGH (ref 15–41)
Albumin: 3.4 g/dL — ABNORMAL LOW (ref 3.5–5.0)
Alkaline Phosphatase: 95 U/L (ref 38–126)
Anion gap: 7 (ref 5–15)
BUN: 11 mg/dL (ref 6–20)
CO2: 31 mmol/L (ref 22–32)
Calcium: 10.3 mg/dL (ref 8.9–10.3)
Chloride: 105 mmol/L (ref 98–111)
Creatinine: 0.62 mg/dL (ref 0.44–1.00)
GFR, Est AFR Am: >60 mL/min (ref >60)
GFR, Estimated: >60 mL/min (ref >60)
Glucose, Bld: 78 mg/dL (ref 70–99)
Potassium: 3.7 mmol/L (ref 3.5–5.1)
Sodium: 143 mmol/L (ref 135–145)
Total Bilirubin: 1.2 mg/dL (ref 0.3–1.2)
Total Protein: 6.4 g/dL — ABNORMAL LOW (ref 6.5–8.1)

## 2019-09-03 LAB — SAMPLE TO BLOOD BANK

## 2019-09-03 MED ORDER — PEGFILGRASTIM-CBQV 6 MG/0.6ML ~~LOC~~ SOSY
PREFILLED_SYRINGE | SUBCUTANEOUS | Status: AC
  Filled 2019-09-03: qty 0.6

## 2019-09-03 MED ORDER — PEGFILGRASTIM-CBQV 6 MG/0.6ML ~~LOC~~ SOSY
6.0000 mg | PREFILLED_SYRINGE | Freq: Once | SUBCUTANEOUS | Status: AC
  Administered 2019-09-03: 6 mg via SUBCUTANEOUS

## 2019-09-03 NOTE — Patient Instructions (Signed)

## 2019-09-04 ENCOUNTER — Inpatient Hospital Stay: Payer: Medicaid Other

## 2019-09-05 ENCOUNTER — Inpatient Hospital Stay: Payer: Medicaid Other

## 2019-09-05 ENCOUNTER — Telehealth: Payer: Self-pay | Admitting: Internal Medicine

## 2019-09-05 NOTE — Telephone Encounter (Signed)
Home Health Verbal Orders - Caller/Agency: Jeff, [place] Number: [phone] Requesting OT/PT/Skilled Nursing/Social Work/Speech Therapy: Home Health PT  Frequency: 1 w 6   Also needs a Home Health Aid 2w2

## 2019-09-05 NOTE — Telephone Encounter (Signed)
[facility] and spoke to [place]. Per Joss[place] we can disregard orders. Pt was accepted on 8/30 and the oncologist referred her.

## 2019-09-05 NOTE — Telephone Encounter (Signed)
Routed to the nurse

## 2019-09-06 ENCOUNTER — Other Ambulatory Visit: Payer: Self-pay

## 2019-09-06 ENCOUNTER — Telehealth: Payer: Self-pay | Admitting: *Deleted

## 2019-09-06 ENCOUNTER — Inpatient Hospital Stay: Payer: Medicaid Other

## 2019-09-06 ENCOUNTER — Inpatient Hospital Stay: Payer: Medicaid Other | Attending: Hematology and Oncology

## 2019-09-06 DIAGNOSIS — C9200 Acute myeloblastic leukemia, not having achieved remission: Secondary | ICD-10-CM | POA: Diagnosis present

## 2019-09-06 DIAGNOSIS — Z800 Family history of malignant neoplasm of digestive organs: Secondary | ICD-10-CM | POA: Insufficient documentation

## 2019-09-06 DIAGNOSIS — Z79899 Other long term (current) drug therapy: Secondary | ICD-10-CM | POA: Diagnosis not present

## 2019-09-06 DIAGNOSIS — I119 Hypertensive heart disease without heart failure: Secondary | ICD-10-CM | POA: Diagnosis not present

## 2019-09-06 DIAGNOSIS — R197 Diarrhea, unspecified: Secondary | ICD-10-CM | POA: Insufficient documentation

## 2019-09-06 DIAGNOSIS — C9501 Acute leukemia of unspecified cell type, in remission: Secondary | ICD-10-CM

## 2019-09-06 LAB — CMP (CANCER CENTER ONLY)
ALT: 18 U/L (ref 0–44)
AST: 20 U/L (ref 15–41)
Albumin: 3.2 g/dL — ABNORMAL LOW (ref 3.5–5.0)
Alkaline Phosphatase: 103 U/L (ref 38–126)
Anion gap: 7 (ref 5–15)
BUN: 13 mg/dL (ref 6–20)
CO2: 26 mmol/L (ref 22–32)
Calcium: 9.4 mg/dL (ref 8.9–10.3)
Chloride: 107 mmol/L (ref 98–111)
Creatinine: 0.63 mg/dL (ref 0.44–1.00)
GFR, Est AFR Am: >60 mL/min (ref >60)
GFR, Estimated: >60 mL/min (ref >60)
Glucose, Bld: 198 mg/dL — ABNORMAL HIGH (ref 70–99)
Potassium: 3.8 mmol/L (ref 3.5–5.1)
Sodium: 140 mmol/L (ref 135–145)
Total Bilirubin: 1.3 mg/dL — ABNORMAL HIGH (ref 0.3–1.2)
Total Protein: 6 g/dL — ABNORMAL LOW (ref 6.5–8.1)

## 2019-09-06 LAB — CBC WITH DIFFERENTIAL (CANCER CENTER ONLY)
Abs Immature Granulocytes: 0 10*3/uL (ref 0.00–0.07)
Basophils Absolute: 0 10*3/uL (ref 0.0–0.1)
Basophils Relative: 3 %
Eosinophils Absolute: 0 10*3/uL (ref 0.0–0.5)
Eosinophils Relative: 0 %
HCT: 32.9 % — ABNORMAL LOW (ref 36.0–46.0)
Hemoglobin: 10.5 g/dL — ABNORMAL LOW (ref 12.0–15.0)
Immature Granulocytes: 0 %
Lymphocytes Relative: 74 %
Lymphs Abs: 0.2 10*3/uL — ABNORMAL LOW (ref 0.7–4.0)
MCH: 32.8 pg (ref 26.0–34.0)
MCHC: 31.9 g/dL (ref 30.0–36.0)
MCV: 102.8 fL — ABNORMAL HIGH (ref 80.0–100.0)
Monocytes Absolute: 0 10*3/uL — ABNORMAL LOW (ref 0.1–1.0)
Monocytes Relative: 0 %
Neutro Abs: 0.1 10*3/uL — CL (ref 1.7–7.7)
Neutrophils Relative %: 23 %
Platelet Count: 12 10*3/uL — ABNORMAL LOW (ref 150–400)
RBC: 3.2 MIL/uL — ABNORMAL LOW (ref 3.87–5.11)
RDW: 16.9 % — ABNORMAL HIGH (ref 11.5–15.5)
WBC Count: 0.3 10*3/uL — CL (ref 4.0–10.5)
nRBC: 0 % (ref 0.0–0.2)

## 2019-09-06 LAB — SAMPLE TO BLOOD BANK

## 2019-09-06 NOTE — Telephone Encounter (Signed)
Spoke with patient in the lobby regarding today's ;ab results. As expected, her WBC and ANC  And platelets have decreased significantly.   WBC 0.3; ANC 0.1 and platelet count is 12.  Pt is afebrile at this time and no active bleeding.  Reviewed neutropenic precautions with her as well as bleeding precautions.  Unfortunately, pt had gone to [place] to her Dermatologist who performed a skin biopsy on her nose this morning. This provider did this without obtaining medical clearance for the procedure.  Pt states she told the dermatologist she just had chemo which ended on Monday, 09/02/19. Pt states it took quite awhile for the area involved to stop bleeding. Dressing appears dry and intact. Advised pt to remove it slowly in the next couple of days, using saline to loosen the gauze. Advised that she should not remove for a couple of days, so as not to dislodge the clot. Advised the she should not have any further procedures until her blood counts recover. Advised that she check with [name] before doing any further procedures.  Advised to call [phone] with any temperature >100.4. Advised that she go to ED with any bleeding. Pt had infectious colitis with significant rectal bleeding after her last round of consolidation chemo.  Stressed the importance of the above a great length with patient and her friend that was with her.  Pt aware of her lab appts scheduled for 09/10/19 and 09/13/19  [name] aware of the above.

## 2019-09-09 ENCOUNTER — Other Ambulatory Visit: Payer: Self-pay

## 2019-09-09 ENCOUNTER — Emergency Department (HOSPITAL_COMMUNITY): Payer: Medicaid Other

## 2019-09-09 ENCOUNTER — Encounter (HOSPITAL_COMMUNITY): Payer: Self-pay | Admitting: Emergency Medicine

## 2019-09-09 ENCOUNTER — Inpatient Hospital Stay (HOSPITAL_COMMUNITY)
Admission: EM | Admit: 2019-09-09 | Discharge: 2019-09-18 | DRG: 871 | Disposition: A | Discharge status: Home / Self Care | Payer: Medicaid Other | Attending: Internal Medicine | Admitting: Internal Medicine

## 2019-09-09 DIAGNOSIS — E872 Acidosis: Secondary | ICD-10-CM | POA: Diagnosis present

## 2019-09-09 DIAGNOSIS — T451X5A Adverse effect of antineoplastic and immunosuppressive drugs, initial encounter: Secondary | ICD-10-CM | POA: Diagnosis present

## 2019-09-09 DIAGNOSIS — Z20822 Contact with and (suspected) exposure to covid-19: Secondary | ICD-10-CM | POA: Diagnosis present

## 2019-09-09 DIAGNOSIS — A419 Sepsis, unspecified organism: Secondary | ICD-10-CM | POA: Diagnosis present

## 2019-09-09 DIAGNOSIS — J9601 Acute respiratory failure with hypoxia: Secondary | ICD-10-CM | POA: Diagnosis present

## 2019-09-09 DIAGNOSIS — Z886 Allergy status to analgesic agent status: Secondary | ICD-10-CM

## 2019-09-09 DIAGNOSIS — R0902 Hypoxemia: Secondary | ICD-10-CM

## 2019-09-09 DIAGNOSIS — R509 Fever, unspecified: Secondary | ICD-10-CM | POA: Diagnosis present

## 2019-09-09 DIAGNOSIS — K121 Other forms of stomatitis: Secondary | ICD-10-CM | POA: Diagnosis not present

## 2019-09-09 DIAGNOSIS — I110 Hypertensive heart disease with heart failure: Secondary | ICD-10-CM | POA: Diagnosis present

## 2019-09-09 DIAGNOSIS — R6520 Severe sepsis without septic shock: Secondary | ICD-10-CM | POA: Diagnosis present

## 2019-09-09 DIAGNOSIS — D696 Thrombocytopenia, unspecified: Secondary | ICD-10-CM | POA: Diagnosis not present

## 2019-09-09 DIAGNOSIS — R34 Anuria and oliguria: Secondary | ICD-10-CM | POA: Diagnosis not present

## 2019-09-09 DIAGNOSIS — Z888 Allergy status to other drugs, medicaments and biological substances status: Secondary | ICD-10-CM

## 2019-09-09 DIAGNOSIS — K921 Melena: Secondary | ICD-10-CM | POA: Diagnosis present

## 2019-09-09 DIAGNOSIS — F329 Major depressive disorder, single episode, unspecified: Secondary | ICD-10-CM | POA: Diagnosis present

## 2019-09-09 DIAGNOSIS — C9201 Acute myeloblastic leukemia, in remission: Secondary | ICD-10-CM | POA: Diagnosis present

## 2019-09-09 DIAGNOSIS — F419 Anxiety disorder, unspecified: Secondary | ICD-10-CM | POA: Diagnosis present

## 2019-09-09 DIAGNOSIS — R441 Visual hallucinations: Secondary | ICD-10-CM | POA: Diagnosis not present

## 2019-09-09 DIAGNOSIS — D638 Anemia in other chronic diseases classified elsewhere: Secondary | ICD-10-CM | POA: Diagnosis present

## 2019-09-09 DIAGNOSIS — D709 Neutropenia, unspecified: Secondary | ICD-10-CM | POA: Diagnosis present

## 2019-09-09 DIAGNOSIS — I10 Essential (primary) hypertension: Secondary | ICD-10-CM | POA: Diagnosis present

## 2019-09-09 DIAGNOSIS — D62 Acute posthemorrhagic anemia: Secondary | ICD-10-CM | POA: Diagnosis not present

## 2019-09-09 DIAGNOSIS — C9200 Acute myeloblastic leukemia, not having achieved remission: Secondary | ICD-10-CM | POA: Diagnosis present

## 2019-09-09 DIAGNOSIS — E861 Hypovolemia: Secondary | ICD-10-CM | POA: Diagnosis not present

## 2019-09-09 DIAGNOSIS — K580 Irritable bowel syndrome with diarrhea: Secondary | ICD-10-CM | POA: Diagnosis present

## 2019-09-09 DIAGNOSIS — D8481 Immunodeficiency due to conditions classified elsewhere: Secondary | ICD-10-CM | POA: Diagnosis present

## 2019-09-09 DIAGNOSIS — Z9884 Bariatric surgery status: Secondary | ICD-10-CM

## 2019-09-09 DIAGNOSIS — J189 Pneumonia, unspecified organism: Secondary | ICD-10-CM

## 2019-09-09 DIAGNOSIS — D61818 Other pancytopenia: Secondary | ICD-10-CM | POA: Diagnosis present

## 2019-09-09 DIAGNOSIS — E876 Hypokalemia: Secondary | ICD-10-CM | POA: Diagnosis not present

## 2019-09-09 DIAGNOSIS — B1910 Unspecified viral hepatitis B without hepatic coma: Secondary | ICD-10-CM | POA: Diagnosis present

## 2019-09-09 DIAGNOSIS — C9501 Acute leukemia of unspecified cell type, in remission: Secondary | ICD-10-CM

## 2019-09-09 DIAGNOSIS — A4159 Other Gram-negative sepsis: Secondary | ICD-10-CM | POA: Diagnosis present

## 2019-09-09 DIAGNOSIS — I480 Paroxysmal atrial fibrillation: Secondary | ICD-10-CM | POA: Diagnosis not present

## 2019-09-09 DIAGNOSIS — I361 Nonrheumatic tricuspid (valve) insufficiency: Secondary | ICD-10-CM | POA: Diagnosis not present

## 2019-09-09 DIAGNOSIS — D61810 Antineoplastic chemotherapy induced pancytopenia: Secondary | ICD-10-CM | POA: Diagnosis present

## 2019-09-09 DIAGNOSIS — Z833 Family history of diabetes mellitus: Secondary | ICD-10-CM

## 2019-09-09 DIAGNOSIS — A4150 Gram-negative sepsis, unspecified: Secondary | ICD-10-CM | POA: Diagnosis not present

## 2019-09-09 DIAGNOSIS — I5031 Acute diastolic (congestive) heart failure: Secondary | ICD-10-CM | POA: Diagnosis present

## 2019-09-09 DIAGNOSIS — M7989 Other specified soft tissue disorders: Secondary | ICD-10-CM | POA: Diagnosis not present

## 2019-09-09 DIAGNOSIS — Z87891 Personal history of nicotine dependence: Secondary | ICD-10-CM

## 2019-09-09 DIAGNOSIS — R5081 Fever presenting with conditions classified elsewhere: Secondary | ICD-10-CM | POA: Diagnosis not present

## 2019-09-09 DIAGNOSIS — Z800 Family history of malignant neoplasm of digestive organs: Secondary | ICD-10-CM

## 2019-09-09 DIAGNOSIS — I340 Nonrheumatic mitral (valve) insufficiency: Secondary | ICD-10-CM | POA: Diagnosis not present

## 2019-09-09 HISTORY — DX: Malignant (primary) neoplasm, unspecified: C80.1

## 2019-09-09 LAB — LACTIC ACID, PLASMA
Lactic Acid, Venous: 2.1 mmol/L (ref 0.5–1.9)
Lactic Acid, Venous: 2.8 mmol/L (ref 0.5–1.9)

## 2019-09-09 LAB — COMPREHENSIVE METABOLIC PANEL
ALT: 39 U/L (ref 0–44)
AST: 50 U/L — ABNORMAL HIGH (ref 15–41)
Albumin: 3.2 g/dL — ABNORMAL LOW (ref 3.5–5.0)
Alkaline Phosphatase: 86 U/L (ref 38–126)
Anion gap: 10 (ref 5–15)
BUN: 14 mg/dL (ref 6–20)
CO2: 23 mmol/L (ref 22–32)
Calcium: 9 mg/dL (ref 8.9–10.3)
Chloride: 102 mmol/L (ref 98–111)
Creatinine, Ser: 0.56 mg/dL (ref 0.44–1.00)
GFR calc Af Amer: >60 mL/min (ref >60)
GFR calc non Af Amer: >60 mL/min (ref >60)
Glucose, Bld: 148 mg/dL — ABNORMAL HIGH (ref 70–99)
Potassium: 3 mmol/L — ABNORMAL LOW (ref 3.5–5.1)
Sodium: 135 mmol/L (ref 135–145)
Total Bilirubin: 1.5 mg/dL — ABNORMAL HIGH (ref 0.3–1.2)
Total Protein: 6.3 g/dL — ABNORMAL LOW (ref 6.5–8.1)

## 2019-09-09 LAB — CBC WITH DIFFERENTIAL/PLATELET
HCT: 28.4 % — ABNORMAL LOW (ref 36.0–46.0)
Hemoglobin: 9.4 g/dL — ABNORMAL LOW (ref 12.0–15.0)
MCH: 33 pg (ref 26.0–34.0)
MCHC: 33.1 g/dL (ref 30.0–36.0)
MCV: 99.6 fL (ref 80.0–100.0)
Platelets: <5 10*3/uL — CL (ref 150–400)
RBC: 2.85 MIL/uL — ABNORMAL LOW (ref 3.87–5.11)
RDW: 16.8 % — ABNORMAL HIGH (ref 11.5–15.5)
WBC: <0.1 10*3/uL — CL (ref 4.0–10.5)
nRBC: 0 % (ref 0.0–0.2)

## 2019-09-09 LAB — PROTIME-INR
INR: 1.1 (ref 0.8–1.2)
Prothrombin Time: 14.2 seconds (ref 11.4–15.2)

## 2019-09-09 LAB — APTT: aPTT: 35 seconds (ref 24–36)

## 2019-09-09 LAB — SARS CORONAVIRUS 2 BY RT PCR (HOSPITAL ORDER, PERFORMED IN ~~LOC~~ HOSPITAL LAB): SARS Coronavirus 2: NEGATIVE

## 2019-09-09 MED ORDER — VANCOMYCIN HCL 2000 MG/400ML IV SOLN
2000.0000 mg | Freq: Once | INTRAVENOUS | Status: AC
  Administered 2019-09-09: 2000 mg via INTRAVENOUS
  Filled 2019-09-09: qty 400

## 2019-09-09 MED ORDER — SODIUM CHLORIDE 0.9 % IV SOLN: Freq: Once | INTRAVENOUS | Status: AC

## 2019-09-09 MED ORDER — SODIUM CHLORIDE 0.9 % IV SOLN
10.0000 mL/h | Freq: Once | INTRAVENOUS | Status: AC
  Administered 2019-09-09: 10 mL/h via INTRAVENOUS

## 2019-09-09 MED ORDER — SODIUM CHLORIDE 0.9 % IV SOLN
2.0000 g | Freq: Once | INTRAVENOUS | Status: AC
  Administered 2019-09-09: 2 g via INTRAVENOUS
  Filled 2019-09-09: qty 2

## 2019-09-09 MED ORDER — LACTATED RINGERS IV BOLUS (SEPSIS)
1000.0000 mL | Freq: Once | INTRAVENOUS | Status: AC
  Administered 2019-09-09: 1000 mL via INTRAVENOUS

## 2019-09-09 MED ORDER — IOHEXOL 300 MG/ML  SOLN
100.0000 mL | Freq: Once | INTRAMUSCULAR | Status: AC | PRN
  Administered 2019-09-09: 100 mL via INTRAVENOUS

## 2019-09-09 NOTE — ED Provider Notes (Signed)
[facility] DEPT Provider Note   CSN: [id number] Arrival date & time: 09/09/19  2027     History Chief Complaint  Patient presents with  . Fever    Britanee Vanblarcom is a 59 y.o. female.  The history is provided by the patient.  Fever Max temp prior to arrival:  102 Temp source:  Oral Severity:  Moderate Onset quality:  Gradual Timing:  Constant Progression:  Unchanged Chronicity:  New Relieved by:  Nothing Worsened by:  Nothing Associated symptoms: chills and myalgias   Associated symptoms: no chest pain, no confusion, no congestion, no cough, no diarrhea, no dysuria, no ear pain, no rash, no sore throat and no vomiting   Risk factors: hx of cancer and immunosuppression (hx of AML on chemo. Last wenesday with last dose. Has nose biopsy Friday and had some bleeding after but has resolved, no sure where bleeding from. )        Past Medical History:  Diagnosis Date  . Anxiety   . Cancer ([place])   . Hypertension   . IBS (irritable bowel syndrome)     Patient Active Problem List   Diagnosis Date Noted  . Typhlitis   . Colitis 07/29/2019  . Sepsis ([place]) 07/29/2019  . Pancytopenia ([place]) 07/29/2019  . Antineoplastic chemotherapy induced pancytopenia ([place]) 07/01/2019  . Pancytopenia due to antineoplastic chemotherapy ([place]) 07/01/2019  . Headache 07/01/2019  . Neutropenia ([place]) 07/01/2019  . Macrocytic anemia 05/01/2019  . AML (acute myeloblastic leukemia) ([place]) 05/01/2019  . Neck pain on right side 05/01/2019  . Leukocytosis 05/01/2019  . Bicytopenia 04/30/2019  . Macrocytosis without anemia 02/12/2019  . Family history of hemochromatosis 02/12/2019  . Mixed hyperlipidemia 01/18/2019  . Abnormal LFTs 01/18/2019  . Dandruff in adult 01/18/2019  . Hair loss 01/18/2019  . Muscle weakness 01/18/2019  . Peripheral neuropathy 01/18/2019  . Other fatigue 01/18/2019  . Essential hypertension 06/01/2018  . Palpitations 06/01/2018  . Anxiety  disorder 06/01/2018  . Insomnia 06/01/2018  . Irritable bowel syndrome with diarrhea 06/01/2018  . Barrett's esophagus with dysplasia 06/01/2018    Past Surgical History:  Procedure Laterality Date  . GASTRIC BYPASS  2013  . NISSEN FUNDOPLICATION  6203   Reversed in 2013  . ORIF ANKLE FRACTURE Left 06/2017  . PLACEMENT OF BREAST IMPLANTS       OB History   No obstetric history on file.     Family History  Problem Relation Age of Onset  . Pancreatic cancer Father   . Colon cancer Maternal Aunt   . Diabetes Maternal Uncle   . Pancreatic cancer Maternal Grandfather     Social History   Tobacco Use  . Smoking status: Former Research scientist (life sciences)  . Smokeless tobacco: Never Used  Substance Use Topics  . Alcohol use: Not Currently    Comment: occasionally   . Drug use: Not Currently    Home Medications Prior to Admission medications   Medication Sig Start Date End Date Taking? Authorizing Provider  acyclovir (ZOVIRAX) 200 MG capsule Take 400 mg by mouth 2 (two) times daily.  05/31/19   [provider]  cyclobenzaprine (FLEXERIL) 5 MG tablet Take 1 tablet (5 mg total) by mouth 3 (three) times daily as needed for muscle spasms. Patient not taking: Reported on 07/29/2019 05/01/19   Vernelle Emerald, MD  entecavir (BARACLUDE) 0.5 MG tablet Take 0.5 mg by mouth daily.  06/01/19 05/31/20  [provider]  escitalopram (LEXAPRO) 10 MG tablet Take 10 mg  by mouth daily.  06/19/19 09/17/19  [provider]  hydrochlorothiazide (HYDRODIURIL) 25 MG tablet Take 25 mg by mouth daily.  06/01/19 05/31/20  [provider]  LORazepam (ATIVAN) 1 MG tablet Take 1 mg by mouth at bedtime as needed for anxiety or sleep.  06/18/19   [provider]  losartan (COZAAR) 25 MG tablet Take 1 tablet (25 mg total) by mouth daily. 05/02/19   Shalhoub, Sherryll Burger, MD  metroNIDAZOLE (FLAGYL) 500 MG tablet Take 1 tablet (500 mg total) by mouth every 8 (eight) hours. 08/03/19   Shawna Clamp, MD  Multiple Vitamin (MULTIVITAMIN WITH MINERALS) TABS tablet Take 1 tablet by mouth daily. 07/04/19   Sheikh, Omair Latif, DO  ondansetron (ZOFRAN) 4 MG tablet Take 1 tablet (4 mg total) by mouth every 6 (six) hours as needed for nausea. 07/03/19   Sheikh, Georgina Quint Latif, DO  polyethylene glycol (MIRALAX / GLYCOLAX) 17 g packet Take 17 g by mouth daily as needed for moderate constipation or severe constipation. 07/03/19   Sheikh, Omair Latif, DO  senna-docusate (SENOKOT-S) 8.6-50 MG tablet Take 1 tablet by mouth at bedtime. 07/03/19   Sheikh, Georgina Quint Latif, DO  traMADol (ULTRAM) 50 MG tablet Take 1 tablet (50 mg total) by mouth every 8 (eight) hours as needed (mild pain). 07/03/19   Raiford Noble Latif, DO  traZODone (DESYREL) 50 MG tablet Take 50 mg by mouth at bedtime as needed for sleep.  05/31/19   [provider]    Allergies    Ace inhibitors, Lisinopril, and Tylenol [acetaminophen]  Review of Systems   Review of Systems  Constitutional: Positive for chills and fever.  HENT: Negative for congestion, ear pain and sore throat.   Eyes: Negative for pain and visual disturbance.  Respiratory: Negative for cough and shortness of breath.   Cardiovascular: Negative for chest pain and palpitations.  Gastrointestinal: Positive for abdominal pain. Negative for diarrhea and vomiting.  Genitourinary: Negative for dysuria and hematuria.  Musculoskeletal: Positive for myalgias. Negative for arthralgias and back pain.  Skin: Positive for wound (nose). Negative for color change and rash.  Neurological: Negative for seizures and syncope.  Psychiatric/Behavioral: Negative for confusion.  All other systems reviewed and are negative.   Physical Exam Updated Vital Signs  ED Triage Vitals  Enc Vitals Group     BP 09/09/19 2048 (!) 175/84     Pulse Rate 09/09/19 2048 (!) 128     Resp 09/09/19 2048 (!) 30     Temp 09/09/19 2048 (!) 103.1 F (39.5 C)     Temp Source 09/09/19 2048 Oral      SpO2 09/09/19 2036 96 %     Weight 09/09/19 2051 199 lb 15.3 oz (90.7 kg)     Height 09/09/19 2051 5\' 8"  (1.727 m)     Head Circumference --      Peak Flow --      Pain Score --      Pain Loc --      Pain Edu? --      Excl. in [place]? --     Physical Exam Vitals and nursing note reviewed.  Constitutional:      General: She is not in acute distress.    Appearance: She is well-developed. She is ill-appearing.  HENT:     Head: Normocephalic and atraumatic.     Nose:     Comments: Dried blood on nose, no nasal septal hematoma     Mouth/Throat:  Comments: Clot to posterior left pharynx, no signs of active bleeding Eyes:     Extraocular Movements: Extraocular movements intact.     Conjunctiva/sclera: Conjunctivae normal.     Pupils: Pupils are equal, round, and reactive to light.  Cardiovascular:     Rate and Rhythm: Normal rate and regular rhythm.     Pulses: Normal pulses.     Heart sounds: Normal heart sounds. No murmur heard.   Pulmonary:     Effort: Pulmonary effort is normal. No respiratory distress.     Breath sounds: Normal breath sounds.  Abdominal:     General: Abdomen is flat.     Palpations: Abdomen is soft.     Tenderness: There is abdominal tenderness. There is guarding.  Musculoskeletal:        General: Normal range of motion.     Cervical back: Normal range of motion and neck supple.  Skin:    General: Skin is warm and dry.     Capillary Refill: Capillary refill takes less than 2 seconds.  Neurological:     General: No focal deficit present.     Mental Status: She is alert.  Psychiatric:        Mood and Affect: Mood normal.     ED Results / Procedures / Treatments   Labs (all labs ordered are listed, but only abnormal results are displayed) Labs Reviewed  LACTIC ACID, PLASMA - Abnormal; Notable for the following components:      Result Value   Lactic Acid, Venous 2.1 (*)    All other components within normal limits  COMPREHENSIVE METABOLIC PANEL -  Abnormal; Notable for the following components:   Potassium 3.0 (*)    Glucose, Bld 148 (*)    Total Protein 6.3 (*)    Albumin 3.2 (*)    AST 50 (*)    Total Bilirubin 1.5 (*)    All other components within normal limits  CBC WITH DIFFERENTIAL/PLATELET - Abnormal; Notable for the following components:   WBC <0.1 (*)    RBC 2.85 (*)    Hemoglobin 9.4 (*)    HCT 28.4 (*)    RDW 16.8 (*)    Platelets <5 (*)    All other components within normal limits  URINE CULTURE  SARS CORONAVIRUS 2 BY RT PCR (HOSPITAL ORDER, [facility])  CULTURE, BLOOD (ROUTINE X 2)  CULTURE, BLOOD (ROUTINE X 2)  PROTIME-INR  APTT  LACTIC ACID, PLASMA  URINALYSIS, ROUTINE W REFLEX MICROSCOPIC  TYPE AND SCREEN  PREPARE PLATELET PHERESIS    EKG EKG Interpretation  Date/Time:  Monday September 09 2019 20:43:33 EDT Ventricular Rate:  128 PR Interval:    QRS Duration: 85 QT Interval:  277 QTC Calculation: 405 R Axis:   39 Text Interpretation: Sinus tachycardia Borderline ST depression, lateral leads Confirmed by Lennice Sites [phone]) on 09/09/2019 8:56:29 PM Also confirmed by Lennice Sites [phone])  on 09/09/2019 9:08:14 PM   Radiology CT ABDOMEN PELVIS W CONTRAST  Result Date: 09/09/2019 CLINICAL DATA:  Leukemia.  Recent chemotherapy.  Fever. EXAM: CT ABDOMEN AND PELVIS WITH CONTRAST TECHNIQUE: Multidetector CT imaging of the abdomen and pelvis was performed using the standard protocol following bolus administration of intravenous contrast. CONTRAST:  177mL OMNIPAQUE IOHEXOL 300 MG/ML  SOLN COMPARISON:  07/29/2019 FINDINGS: Lower chest: Linear scarring or atelectasis in the right lung base. No effusions or acute abnormality. Hepatobiliary: Low-density lesion posteriorly in the liver is stable since prior study measuring 1.7 cm.  This is likely hemangioma. No suspicious Vac abnormality. Gallbladder unremarkable. Pancreas: No focal abnormality or ductal dilatation. Spleen: No focal  abnormality.  Normal size. Adrenals/Urinary Tract: No adrenal abnormality. No focal renal abnormality. No stones or hydronephrosis. Urinary bladder is unremarkable. Stomach/Bowel: There is fatty proliferation within the right colonic wall, likely related to prior inflammatory process/infection. No evidence for active enteritis/colitis. Prior gastric bypass. Vascular/Lymphatic: No evidence of aneurysm or adenopathy. Scattered aortic calcifications. Reproductive: Uterus and adnexa unremarkable.  No mass. Other: No free fluid or free air. Musculoskeletal: No acute bony abnormality. IMPRESSION: No acute findings in the abdomen or pelvis. Electronically Signed   By: Rolm Baptise M.D.   On: 09/09/2019 22:12   DG Chest Port 1 View  Result Date: 09/09/2019 CLINICAL DATA:  Leukemia patient.  Fever.  Questionable sepsis. EXAM: PORTABLE CHEST 1 VIEW COMPARISON:  07/29/2019 FINDINGS: Cardiac silhouette is normal in size. No mediastinal or hilar masses or evidence of adenopathy. Clear lungs.  No pleural effusion or pneumothorax. Skeletal structures are grossly intact. IMPRESSION: No active disease. Electronically Signed   By: Lajean Manes M.D.   On: 09/09/2019 20:58    Procedures .Critical Care Performed by: Lennice Sites, DO Authorized by: Lennice Sites, DO   Critical care provider statement:    Critical care time (minutes):  40   Critical care was necessary to treat or prevent imminent or life-threatening deterioration of the following conditions:  Sepsis   Critical care was time spent personally by me on the following activities:  Blood draw for specimens, development of treatment plan with patient or surrogate, evaluation of patient's response to treatment, discussions with primary provider, examination of patient, obtaining history from patient or surrogate, ordering and performing treatments and interventions, ordering and review of laboratory studies, ordering and review of radiographic studies, pulse  oximetry, re-evaluation of patient's condition and review of old charts   I assumed direction of critical care for this patient from another provider in my specialty: no     (including critical care time)  Medications Ordered in ED Medications  vancomycin (VANCOREADY) IVPB 2000 mg/400 mL (2,000 mg Intravenous New Bag/Given 09/09/19 2132)  0.9 %  sodium chloride infusion (has no administration in time range)  0.9 %  sodium chloride infusion (has no administration in time range)  lactated ringers bolus 1,000 mL (1,000 mLs Intravenous New Bag/Given 09/09/19 2127)  ceFEPIme (MAXIPIME) 2 g in sodium chloride 0.9 % 100 mL IVPB (0 g Intravenous Stopped 09/09/19 2211)  iohexol (OMNIPAQUE) 300 MG/ML solution 100 mL (100 mLs Intravenous Contrast Given 09/09/19 2152)    ED Course  I have reviewed the triage vital signs and the nursing notes.  Pertinent labs & imaging results that were available during my care of the patient were reviewed by me and considered in my medical decision making (see chart for details).    MDM Rules/Calculators/A&P                          Khala Tarte is a 59 year old female with leukemia on chemotherapy who presents to the ED with fever.  Patient febrile, tachycardic, tachypneic.  Not vaccinated against coronavirus.  Did have biopsy of skin on her nose on Friday.  Had some bleeding afterwards that has resolved.  Does appear to have what looks to be a blood clot in the back of the left throat.  But there is no signs of active bleeding from the throat or nose.  She is  having abdominal pain as well.  She is tender diffusely on exam.  Overall suspect neutropenic fever.  Concerned about likely having low platelets and anemia as well as patient has had a history of the same.  Will get sepsis labs.  Will give empiric IV antibiotics with vancomycin and cefepime.  Will give fluid bolus.  At this time unable to treat fever because she has Tylenol allergy.  Would like to avoid Motrin given  concern for thrombocytopenia.  Patient with neutropenia.  White count is less than 0.1.  Platelets also less than 5.  However hemoglobin is stable at 9.4.  Will transfuse platelets given that they are less than 5 and seems that she has had some recent bleeding from likely surgical site/nose.  CT scan showed no signs of infection was overall unremarkable.  Chest x-ray without signs of infection.  Lactic acid 2.1.  INR is normal.  Broad-spectrum IV antibiotics have been given.  Patient still tachycardic to the 140s intermittently.  Will admit to medicine for further care.  This chart was dictated using voice recognition software.  Despite best efforts to proofread,  errors can occur which can change the documentation meaning.   Jasmon Mattice was evaluated in Emergency Department on 09/09/2019 for the symptoms described in the history of present illness. She was evaluated in the context of the global COVID-19 pandemic, which necessitated consideration that the patient might be at risk for infection with the SARS-CoV-2 virus that causes COVID-19. Institutional protocols and algorithms that pertain to the evaluation of patients at risk for COVID-19 are in a state of rapid change based on information released by regulatory bodies including the CDC and federal and state organizations. These policies and algorithms were followed during the patient's care in the ED.   Final Clinical Impression(s) / ED Diagnoses Final diagnoses:  Neutropenic fever ([place])  Sepsis, due to unspecified organism, unspecified whether acute organ dysfunction present ([place])  Thrombocytopenia [facility])    Rx / DC Orders ED Discharge Orders    None       Lennice Sites, DO 09/09/19 2239

## 2019-09-09 NOTE — ED Notes (Signed)
MD already put in sepsis order sets

## 2019-09-09 NOTE — H&P (Signed)
History and Physical    Kelly Roberson WPV:[id number] DOB: Apr 26, 1960 DOA: 09/09/2019  PCP: Ladell Pier, MD Patient coming from: Home  Chief Complaint: Fever  HPI: Kelly Roberson is a 59 y.o. 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  Patient states her last chemo was on August 31.  Since yesterday she is having fevers and chills.  She is coughing a little.  Denies shortness of breath or chest pain.  Reports having green-colored diarrhea and generalized abdominal pain since yesterday.  She is feeling nauseous but has not vomited.  She is also having headaches.  No additional history could be obtained from her at this time.   ED Course: Temperature 103.1 F.  Tachycardic and tachypneic.  Blood pressure elevated with systolic up to 748O.  Not hypoxic.  WBC <0.01, hemoglobin 9.4, hematocrit 28.4, platelet <5.  Sodium 135, potassium 3.0, chloride 102, bicarb 23, BUN 14, creatinine 0.5, glucose 148.  AST 50, ALT 39, alk phos 86, T bili 1.5.  Lactic acid 2.1.  INR 1.1.  SARS-CoV-2 PCR test negative.  UA and urine culture pending.  Blood culture x2 pending.  Chest x-ray not suggestive of pneumonia.  CT abdomen pelvis without infectious source.  Patient received 1 L LR bolus, vancomycin, cefepime, and platelet transfusion.  Review of Systems:  All systems reviewed and apart from history of presenting illness, are negative.  Past Medical History:  Diagnosis Date  . Anxiety   . Cancer ([place])   . Hypertension   . IBS (irritable bowel syndrome)     Past Surgical History:  Procedure Laterality Date  . GASTRIC BYPASS  2013  . NISSEN FUNDOPLICATION  7078   Reversed in 2013  . ORIF ANKLE FRACTURE Left 06/2017  . PLACEMENT OF BREAST IMPLANTS       reports that she has quit smoking. She has never used smokeless tobacco. She reports  previous alcohol use. She reports previous drug use.  Allergies  Allergen Reactions  . Ace Inhibitors     Other reaction(s): Cough  . Lisinopril Cough  . Tylenol [Acetaminophen] Hives and Itching    Pt. States she only itches from tylenol     Family History  Problem Relation Age of Onset  . Pancreatic cancer Father   . Colon cancer Maternal Aunt   . Diabetes Maternal Uncle   . Pancreatic cancer Maternal Grandfather     Prior to Admission medications   Medication Sig Start Date End Date Taking? Authorizing Provider  acyclovir (ZOVIRAX) 200 MG capsule Take 400 mg by mouth 2 (two) times daily.  05/31/19   [provider]  cyclobenzaprine (FLEXERIL) 5 MG tablet Take 1 tablet (5 mg total) by mouth 3 (three) times daily as needed for muscle spasms. Patient not taking: Reported on 07/29/2019 05/01/19   Vernelle Emerald, MD  entecavir (BARACLUDE) 0.5 MG tablet Take 0.5 mg by mouth daily.  06/01/19 05/31/20  [provider]  escitalopram (LEXAPRO) 10 MG tablet Take 10 mg by mouth daily.  06/19/19 09/17/19  [provider]  hydrochlorothiazide (HYDRODIURIL) 25 MG tablet Take 25 mg by mouth daily.  06/01/19 05/31/20  [provider]  LORazepam (ATIVAN) 1 MG tablet Take 1 mg by mouth at bedtime as needed for anxiety or sleep.  06/18/19   [provider]  losartan (COZAAR) 25 MG tablet Take 1 tablet (25  mg total) by mouth daily. 05/02/19   Shalhoub, Sherryll Burger, MD  metroNIDAZOLE (FLAGYL) 500 MG tablet Take 1 tablet (500 mg total) by mouth every 8 (eight) hours. 08/03/19   Shawna Clamp, MD  Multiple Vitamin (MULTIVITAMIN WITH MINERALS) TABS tablet Take 1 tablet by mouth daily. 07/04/19   Sheikh, Omair Latif, DO  ondansetron (ZOFRAN) 4 MG tablet Take 1 tablet (4 mg total) by mouth every 6 (six) hours as needed for nausea. 07/03/19   Sheikh, Georgina Quint Latif, DO  polyethylene glycol (MIRALAX / GLYCOLAX) 17 g packet Take 17 g by mouth daily as needed for moderate  constipation or severe constipation. 07/03/19   Sheikh, Omair Latif, DO  senna-docusate (SENOKOT-S) 8.6-50 MG tablet Take 1 tablet by mouth at bedtime. 07/03/19   Sheikh, Georgina Quint Latif, DO  traMADol (ULTRAM) 50 MG tablet Take 1 tablet (50 mg total) by mouth every 8 (eight) hours as needed (mild pain). 07/03/19   Raiford Noble Latif, DO  traZODone (DESYREL) 50 MG tablet Take 50 mg by mouth at bedtime as needed for sleep.  05/31/19   [provider]    Physical Exam: Vitals:   09/09/19 2145 09/09/19 2215 09/09/19 2245 09/09/19 2315  BP: 137/72 (!) 154/96 (!) 175/87 (!) 162/64  Pulse: (!) 127 (!) 147 (!) 146 (!) 160  Resp: (!) 30 (!) 43 (!) 51 (!) 36  Temp:      TempSrc:      SpO2: 98% 93% 93% 93%  Weight:      Height:        Physical Exam Constitutional:      General: She is in acute distress.     Appearance: She is ill-appearing and toxic-appearing.     Comments: Having rigors  HENT:     Head: Normocephalic.     Nose:     Comments: Dried blood noted around the nose.  No active bleeding from the nostrils.    Mouth/Throat:     Comments: Dried blood around the mouth Dried blood on the tongue Blood clot noted in oropharynx Eyes:     Extraocular Movements: Extraocular movements intact.     Conjunctiva/sclera: Conjunctivae normal.  Cardiovascular:     Rate and Rhythm: Regular rhythm. Tachycardia present.     Pulses: Normal pulses.     Comments: Tachycardic with heart rate in the 140s Pulmonary:     Breath sounds: No wheezing or rales.     Comments: Tachypneic with RR in 30-40 Abdominal:     General: Bowel sounds are normal. There is no distension.     Palpations: Abdomen is soft.     Tenderness: There is no abdominal tenderness. There is no guarding or rebound.     Comments: Having active diarrhea -dark tarry in appearance  Musculoskeletal:        General: No swelling or tenderness.     Cervical back: Normal range of motion and neck supple. No rigidity.  Skin:     General: Skin is warm and dry.  Neurological:     General: No focal deficit present.     Mental Status: She is alert and oriented to person, place, and time.     Labs on Admission: I have personally reviewed following labs and imaging studies  CBC: Recent Labs  Lab 09/03/19 1121 09/06/19 1223 09/09/19 2115  WBC 2.5* 0.3* <0.1*  NEUTROABS 2.1 0.1* Not Measured  HGB 11.8* 10.5* 9.4*  HCT 37.5 32.9* 28.4*  MCV 104.7* 102.8* 99.6  PLT 69* 12* <  5*   Basic Metabolic Panel: Recent Labs  Lab 09/03/19 1121 09/06/19 1223 09/09/19 2115  NA 143 140 135  K 3.7 3.8 3.0*  CL 105 107 102  CO2 '31 26 23  ' GLUCOSE 78 198* 148*  BUN '11 13 14  ' CREATININE 0.62 0.63 0.56  CALCIUM 10.3 9.4 9.0   GFR: Estimated Creatinine Clearance: 88.1 mL/min (by C-G formula based on SCr of 0.56 mg/dL). Liver Function Tests: Recent Labs  Lab 09/03/19 1121 09/06/19 1223 09/09/19 2115  AST 42* 20 50*  ALT 28 18 39  ALKPHOS 95 103 86  BILITOT 1.2 1.3* 1.5*  PROT 6.4* 6.0* 6.3*  ALBUMIN 3.4* 3.2* 3.2*   No results for input(s): LIPASE, AMYLASE in the last 168 hours. No results for input(s): AMMONIA in the last 168 hours. Coagulation Profile: Recent Labs  Lab 09/09/19 2115  INR 1.1   Cardiac Enzymes: No results for input(s): CKTOTAL, CKMB, CKMBINDEX, TROPONINI in the last 168 hours. BNP (last 3 results) No results for input(s): PROBNP in the last 8760 hours. HbA1C: No results for input(s): HGBA1C in the last 72 hours. CBG: No results for input(s): GLUCAP in the last 168 hours. Lipid Profile: No results for input(s): CHOL, HDL, LDLCALC, TRIG, CHOLHDL, LDLDIRECT in the last 72 hours. Thyroid Function Tests: No results for input(s): TSH, T4TOTAL, FREET4, T3FREE, THYROIDAB in the last 72 hours. Anemia Panel: No results for input(s): VITAMINB12, FOLATE, FERRITIN, TIBC, IRON, RETICCTPCT in the last 72 hours. Urine analysis:    Component Value Date/Time   COLORURINE YELLOW 07/01/2019 0130    APPEARANCEUR CLEAR 07/01/2019 0130   LABSPEC 1.006 07/01/2019 0130   PHURINE 7.0 07/01/2019 0130   GLUCOSEU NEGATIVE 07/01/2019 0130   HGBUR NEGATIVE 07/01/2019 0130   BILIRUBINUR NEGATIVE 07/01/2019 0130   KETONESUR NEGATIVE 07/01/2019 0130   PROTEINUR NEGATIVE 07/01/2019 0130   NITRITE NEGATIVE 07/01/2019 0130   LEUKOCYTESUR NEGATIVE 07/01/2019 0130    Radiological Exams on Admission: CT ABDOMEN PELVIS W CONTRAST  Result Date: 09/09/2019 CLINICAL DATA:  Leukemia.  Recent chemotherapy.  Fever. EXAM: CT ABDOMEN AND PELVIS WITH CONTRAST TECHNIQUE: Multidetector CT imaging of the abdomen and pelvis was performed using the standard protocol following bolus administration of intravenous contrast. CONTRAST:  172m OMNIPAQUE IOHEXOL 300 MG/ML  SOLN COMPARISON:  07/29/2019 FINDINGS: Lower chest: Linear scarring or atelectasis in the right lung base. No effusions or acute abnormality. Hepatobiliary: Low-density lesion posteriorly in the liver is stable since prior study measuring 1.7 cm. This is likely hemangioma. No suspicious Vac abnormality. Gallbladder unremarkable. Pancreas: No focal abnormality or ductal dilatation. Spleen: No focal abnormality.  Normal size. Adrenals/Urinary Tract: No adrenal abnormality. No focal renal abnormality. No stones or hydronephrosis. Urinary bladder is unremarkable. Stomach/Bowel: There is fatty proliferation within the right colonic wall, likely related to prior inflammatory process/infection. No evidence for active enteritis/colitis. Prior gastric bypass. Vascular/Lymphatic: No evidence of aneurysm or adenopathy. Scattered aortic calcifications. Reproductive: Uterus and adnexa unremarkable.  No mass. Other: No free fluid or free air. Musculoskeletal: No acute bony abnormality. IMPRESSION: No acute findings in the abdomen or pelvis. Electronically Signed   By: KRolm BaptiseM.D.   On: 09/09/2019 22:12   DG Chest Port 1 View  Result Date: 09/09/2019 CLINICAL DATA:  Leukemia  patient.  Fever.  Questionable sepsis. EXAM: PORTABLE CHEST 1 VIEW COMPARISON:  07/29/2019 FINDINGS: Cardiac silhouette is normal in size. No mediastinal or hilar masses or evidence of adenopathy. Clear lungs.  No pleural effusion or pneumothorax. Skeletal structures  are grossly intact. IMPRESSION: No active disease. Electronically Signed   By: Lajean Manes M.D.   On: 09/09/2019 20:58    EKG: Independently reviewed.  Sinus tachycardia.  Assessment/Plan Principal Problem:   Severe sepsis (HCC) Active Problems:   Essential hypertension   AML (acute myeloblastic leukemia) (HCC)   Hypokalemia   Pancytopenia (HCC)   Severe sepsis, neutropenic fever: Source unknown at this time.  Patient has AML and is currently on chemotherapy.  Febrile with temperature 103.1 F, tachycardic with heart rate in the 140s, and tachypneic with respiratory rate 30-40.  Having rigors.  WBC <0.01.  Lactic acid 2.1.  SARS-CoV-2 PCR test negative.  Chest x-ray not suggestive of pneumonia.  CT abdomen pelvis without infectious source.  No meningeal signs on exam. -Continue vancomycin and cefepime.  30 cc/kg fluid boluses per sepsis protocol.  UA and urine culture pending.  Blood culture x2 pending.  Check lactic acid level.  Continue to trend lactate and monitor WBC count.  Critical care team has been consulted.  Pancytopenia: Likely related to chemotherapy. WBC count <0.01, hemoglobin 9.4, hematocrit 28.4, platelet count <5k.  Labs done 3 days ago showing WBC count 0.3, hemoglobin 10.5, platelet count 12k.  Patient is currently actively bleeding with bloody/tarry appearing diarrhea. -Discussed with patient's oncologist [name] who stated that patient received G-CSF earlier this week.  He is recommending giving 3 units of platelets and rechecking count.  Recommending transfusing to keep platelet count above 20,000.  Recommending transfusing PRBCs if hemoglobin less than 7.  Oncology will consult in a.m. Patient is complaining  of headaches, stat head CT ordered to rule out spontaneous intracranial bleed.  Critical care team has been consulted.  Bloody diarrhea: Patient severely thrombocytopenic and appears to be actively bleeding.  Having large volume black/tarry diarrhea.   -Check FOBT.  Monitor H&H.  C. difficile PCR, GI pathogen panel, enteric precautions.  Hypokalemia: Potassium 3.0. -Replete potassium.  Check magnesium level and replete if low.  Continue to monitor electrolytes.  AML: Followed by oncology and currently on chemotherapy. -Oncology will consult in a.m.  Hypertension -Hold antihypertensives at this time due to concern for severe sepsis.  DVT prophylaxis: SCDs Code Status: Patient wishes to be full code. Family Communication: No family available at this time. Disposition Plan: Status is: Inpatient Consults: Critical care (Dr. Prudencio Burly)  Remains inpatient appropriate because:IV treatments appropriate due to intensity of illness or inability to take PO and Inpatient level of care appropriate due to severity of illness   Dispo: The patient is from: Home              Anticipated d/c is to: Home              Anticipated d/c date is: > 3 days              Patient currently is not medically stable to d/c.  The medical decision making on this patient was of high complexity and the patient is at high risk for clinical deterioration, therefore this is a level 3 visit.  Shela Leff MD [facility]  If 7PM-7AM, please contact night-coverage [email or web address]  09/09/2019, 11:19 PM

## 2019-09-09 NOTE — ED Triage Notes (Signed)
Pt is a cancer pt (leukemia); had chemo last Wednesday. Pt had nose surgery on Friday. Pt reports tremor and fever starting yesterday. Pt called EMS because her fever kept increasing.  Pt is tachycardic and tachypnic. BP is stable. Pt states that she is allergic to tylenol.

## 2019-09-09 NOTE — Progress Notes (Signed)
A consult was received from an ED physician for Vancomycin & Cefepime per pharmacy dosing.  The patient's profile has been reviewed for ht/wt/allergies/indication/available labs.   A one time order has been placed for Cefepime 2gm & Vancomycin 2gm IV.  Further antibiotics/pharmacy consults should be ordered by admitting physician if indicated.                       Thank you, Netta Cedars PharmD 09/09/2019  8:49 PM

## 2019-09-10 ENCOUNTER — Inpatient Hospital Stay (HOSPITAL_COMMUNITY): Payer: Medicaid Other

## 2019-09-10 ENCOUNTER — Other Ambulatory Visit: Payer: Medicaid Other

## 2019-09-10 ENCOUNTER — Ambulatory Visit: Payer: Medicaid Other

## 2019-09-10 DIAGNOSIS — A419 Sepsis, unspecified organism: Secondary | ICD-10-CM

## 2019-09-10 DIAGNOSIS — R6520 Severe sepsis without septic shock: Secondary | ICD-10-CM

## 2019-09-10 LAB — CBC WITH DIFFERENTIAL/PLATELET
Abs Immature Granulocytes: 0 10*3/uL (ref 0.00–0.07)
Basophils Absolute: 0 10*3/uL (ref 0.0–0.1)
Basophils Relative: 0 %
Eosinophils Absolute: 0 10*3/uL (ref 0.0–0.5)
Eosinophils Relative: 0 %
HCT: 32 % — ABNORMAL LOW (ref 36.0–46.0)
Hemoglobin: 10.5 g/dL — ABNORMAL LOW (ref 12.0–15.0)
Immature Granulocytes: 0 %
Lymphocytes Relative: 92 %
Lymphs Abs: 0.2 10*3/uL — ABNORMAL LOW (ref 0.7–4.0)
MCH: 32.3 pg (ref 26.0–34.0)
MCHC: 32.8 g/dL (ref 30.0–36.0)
MCV: 98.5 fL (ref 80.0–100.0)
Monocytes Absolute: 0 10*3/uL — ABNORMAL LOW (ref 0.1–1.0)
Monocytes Relative: 4 %
Neutro Abs: 0 10*3/uL — ABNORMAL LOW (ref 1.7–7.7)
Neutrophils Relative %: 4 %
Platelets: 10 10*3/uL — CL (ref 150–400)
RBC: 3.25 MIL/uL — ABNORMAL LOW (ref 3.87–5.11)
RDW: 20.2 % — ABNORMAL HIGH (ref 11.5–15.5)
WBC: 0.3 10*3/uL — CL (ref 4.0–10.5)
nRBC: 0 % (ref 0.0–0.2)

## 2019-09-10 LAB — C DIFFICILE QUICK SCREEN W PCR REFLEX
C Diff antigen: NEGATIVE
C Diff interpretation: NOT DETECTED
C Diff toxin: NEGATIVE

## 2019-09-10 LAB — CBC
HCT: 15.7 % — ABNORMAL LOW (ref 36.0–46.0)
Hemoglobin: 5.1 g/dL — CL (ref 12.0–15.0)
MCH: 33.1 pg (ref 26.0–34.0)
MCHC: 31.8 g/dL (ref 30.0–36.0)
MCV: 104 fL — ABNORMAL HIGH (ref 80.0–100.0)
Platelets: 9 10*3/uL — CL (ref 150–400)
RBC: 1.51 MIL/uL — ABNORMAL LOW (ref 3.87–5.11)
RDW: 17.2 % — ABNORMAL HIGH (ref 11.5–15.5)
WBC: <0.1 10*3/uL — CL (ref 4.0–10.5)
nRBC: 0 % (ref 0.0–0.2)

## 2019-09-10 LAB — GASTROINTESTINAL PANEL BY PCR, STOOL (REPLACES STOOL CULTURE)

## 2019-09-10 LAB — BASIC METABOLIC PANEL
Anion gap: 6 (ref 5–15)
BUN: 12 mg/dL (ref 6–20)
CO2: 14 mmol/L — ABNORMAL LOW (ref 22–32)
Calcium: 5.8 mg/dL — CL (ref 8.9–10.3)
Chloride: 120 mmol/L — ABNORMAL HIGH (ref 98–111)
Creatinine, Ser: 0.35 mg/dL — ABNORMAL LOW (ref 0.44–1.00)
GFR calc Af Amer: >60 mL/min (ref >60)
GFR calc non Af Amer: >60 mL/min (ref >60)
Glucose, Bld: 131 mg/dL — ABNORMAL HIGH (ref 70–99)
Potassium: 4.8 mmol/L (ref 3.5–5.1)
Sodium: 140 mmol/L (ref 135–145)

## 2019-09-10 LAB — HEMOGLOBIN AND HEMATOCRIT, BLOOD
HCT: 22.9 % — ABNORMAL LOW (ref 36.0–46.0)
Hemoglobin: 7.6 g/dL — ABNORMAL LOW (ref 12.0–15.0)

## 2019-09-10 LAB — BLOOD CULTURE ID PANEL (REFLEXED) - BCID2

## 2019-09-10 LAB — URINALYSIS, ROUTINE W REFLEX MICROSCOPIC
Bilirubin Urine: NEGATIVE
Glucose, UA: 50 mg/dL — AB
Ketones, ur: NEGATIVE mg/dL
Leukocytes,Ua: NEGATIVE
Nitrite: NEGATIVE
Protein, ur: NEGATIVE mg/dL
Specific Gravity, Urine: 1.027 (ref 1.005–1.030)
pH: 5 (ref 5.0–8.0)

## 2019-09-10 LAB — BLOOD GAS, VENOUS
Acid-base deficit: 10 mmol/L — ABNORMAL HIGH (ref 0.0–2.0)
Bicarbonate: 14.3 mmol/L — ABNORMAL LOW (ref 20.0–28.0)
FIO2: 21
O2 Saturation: 93.2 %
Patient temperature: 98.6
pCO2, Ven: 25.5 mmHg — ABNORMAL LOW (ref 44.0–60.0)
pH, Ven: 7.367 (ref 7.250–7.430)
pO2, Ven: 68.6 mmHg — ABNORMAL HIGH (ref 32.0–45.0)

## 2019-09-10 LAB — LACTIC ACID, PLASMA: Lactic Acid, Venous: 3.3 mmol/L (ref 0.5–1.9)

## 2019-09-10 LAB — OCCULT BLOOD X 1 CARD TO LAB, STOOL: Fecal Occult Bld: POSITIVE — AB

## 2019-09-10 LAB — PREPARE RBC (CROSSMATCH)

## 2019-09-10 LAB — MAGNESIUM: Magnesium: 1.4 mg/dL — ABNORMAL LOW (ref 1.7–2.4)

## 2019-09-10 LAB — STREP PNEUMONIAE URINARY ANTIGEN: Strep Pneumo Urinary Antigen: NEGATIVE

## 2019-09-10 LAB — MRSA PCR SCREENING: MRSA by PCR: NEGATIVE

## 2019-09-10 MED ORDER — PANTOPRAZOLE SODIUM 40 MG IV SOLR
40.0000 mg | Freq: Once | INTRAVENOUS | Status: AC
  Administered 2019-09-10: 40 mg via INTRAVENOUS
  Filled 2019-09-10: qty 40

## 2019-09-10 MED ORDER — HYDROMORPHONE HCL 1 MG/ML IJ SOLN
0.5000 mg | INTRAMUSCULAR | Status: DC | PRN
  Administered 2019-09-10 – 2019-09-11 (×7): 0.5 mg via INTRAVENOUS
  Filled 2019-09-10 (×8): qty 1

## 2019-09-10 MED ORDER — MAGNESIUM SULFATE 2 GM/50ML IV SOLN
2.0000 g | Freq: Once | INTRAVENOUS | Status: AC
  Administered 2019-09-10: 2 g via INTRAVENOUS
  Filled 2019-09-10: qty 50

## 2019-09-10 MED ORDER — LABETALOL HCL 5 MG/ML IV SOLN
10.0000 mg | INTRAVENOUS | Status: DC | PRN
  Administered 2019-09-11 (×2): 10 mg via INTRAVENOUS
  Filled 2019-09-10 (×3): qty 4

## 2019-09-10 MED ORDER — SODIUM CHLORIDE 0.9 % IV SOLN: 80.0000 mg | Freq: Once | INTRAVENOUS | Status: DC

## 2019-09-10 MED ORDER — VANCOMYCIN HCL IN DEXTROSE 1-5 GM/200ML-% IV SOLN
1000.0000 mg | Freq: Two times a day (BID) | INTRAVENOUS | Status: DC
  Administered 2019-09-10: 1000 mg via INTRAVENOUS
  Filled 2019-09-10: qty 200

## 2019-09-10 MED ORDER — DIPHENHYDRAMINE HCL 50 MG/ML IJ SOLN
25.0000 mg | Freq: Four times a day (QID) | INTRAMUSCULAR | Status: DC | PRN
  Administered 2019-09-10 – 2019-09-12 (×3): 25 mg via INTRAVENOUS
  Filled 2019-09-10 (×3): qty 1

## 2019-09-10 MED ORDER — POTASSIUM CHLORIDE 10 MEQ/100ML IV SOLN
10.0000 meq | INTRAVENOUS | Status: AC
  Administered 2019-09-10 (×4): 10 meq via INTRAVENOUS
  Filled 2019-09-10 (×4): qty 100

## 2019-09-10 MED ORDER — PANTOPRAZOLE SODIUM 40 MG IV SOLR
40.0000 mg | Freq: Two times a day (BID) | INTRAVENOUS | Status: DC
  Administered 2019-09-10 – 2019-09-12 (×7): 40 mg via INTRAVENOUS
  Filled 2019-09-10 (×7): qty 40

## 2019-09-10 MED ORDER — POLYETHYLENE GLYCOL 3350 17 G PO PACK: 17.0000 g | PACK | Freq: Every day | ORAL | Status: DC | PRN

## 2019-09-10 MED ORDER — CHLORHEXIDINE GLUCONATE CLOTH 2 % EX PADS
6.0000 | MEDICATED_PAD | Freq: Every day | CUTANEOUS | Status: DC
  Administered 2019-09-10 – 2019-09-18 (×8): 6 via TOPICAL

## 2019-09-10 MED ORDER — SODIUM CHLORIDE 0.9% IV SOLUTION: Freq: Once | INTRAVENOUS | Status: AC

## 2019-09-10 MED ORDER — POTASSIUM CHLORIDE IN NACL 40-0.9 MEQ/L-% IV SOLN
INTRAVENOUS | Status: DC
  Filled 2019-09-10 (×4): qty 1000

## 2019-09-10 MED ORDER — SODIUM CHLORIDE 0.9 % IV BOLUS
1000.0000 mL | Freq: Once | INTRAVENOUS | Status: AC
  Administered 2019-09-10: 1000 mL via INTRAVENOUS

## 2019-09-10 MED ORDER — SODIUM CHLORIDE 0.9 % IV SOLN
2.0000 g | Freq: Three times a day (TID) | INTRAVENOUS | Status: DC
  Administered 2019-09-10 – 2019-09-15 (×18): 2 g via INTRAVENOUS
  Filled 2019-09-10 (×19): qty 2

## 2019-09-10 MED ORDER — ONDANSETRON HCL 4 MG/2ML IJ SOLN
4.0000 mg | Freq: Four times a day (QID) | INTRAMUSCULAR | Status: DC | PRN
  Administered 2019-09-11 – 2019-09-17 (×3): 4 mg via INTRAVENOUS
  Filled 2019-09-10 (×3): qty 2

## 2019-09-10 MED ORDER — METRONIDAZOLE IN NACL 5-0.79 MG/ML-% IV SOLN
500.0000 mg | Freq: Three times a day (TID) | INTRAVENOUS | Status: DC
  Administered 2019-09-10 (×2): 500 mg via INTRAVENOUS
  Filled 2019-09-10 (×2): qty 100

## 2019-09-10 MED ORDER — HYDROMORPHONE HCL 1 MG/ML IJ SOLN
0.5000 mg | Freq: Once | INTRAMUSCULAR | Status: AC
  Administered 2019-09-10: 0.5 mg via INTRAVENOUS

## 2019-09-10 MED ORDER — ACETAMINOPHEN 650 MG RE SUPP: 650.0000 mg | Freq: Four times a day (QID) | RECTAL | Status: DC | PRN

## 2019-09-10 MED ORDER — ACETAMINOPHEN 325 MG PO TABS
650.0000 mg | ORAL_TABLET | Freq: Four times a day (QID) | ORAL | Status: DC | PRN
  Filled 2019-09-10 (×2): qty 2

## 2019-09-10 MED ORDER — DOCUSATE SODIUM 100 MG PO CAPS: 100.0000 mg | ORAL_CAPSULE | Freq: Two times a day (BID) | ORAL | Status: DC | PRN

## 2019-09-10 NOTE — H&P (Signed)
NAME:  Kelly Roberson, MRN:  [id number], DOB:  1960-02-03, LOS: 1 ADMISSION DATE:  09/09/2019, CONSULTATION DATE:  09/10/19 REFERRING MD:  ED, CHIEF COMPLAINT:   Malaise, fever   Brief History   Kelly Roberson is a 59 yo woman with AML, currently undergoing chemotherapy, here with fever, malaise, thrombocytopenia and black/bloody diarrhea, neutropenic fever and sinus tachycardia and tachypnea.  History of present illness   Fever and chills since 9/5.  Diarrhea (non bloody not black) at home x 1 day, abdominal pain x 1 day.  Minor cough, non productive.  +nausea, no vomiting.  Received G-CSF in the past week.    Has received 2.5 L IV fluids at the time of my exam.  Has received 1 u platelets, as well as vanc and cefepime.  Past Medical History  AML on chemotherapy (last August 31) HTN IBS  Hep B  S/p gastric bypass 2013 Anxiety Nasal skin biopsy on 9/3 Former smoker  Significant Hospital Events     Consults:  Oncology   Procedures:    Significant Diagnostic Tests:   CXR : no acute disease.  CT head: IMPRESSION: 1. No acute intracranial abnormalities. 2. Chronic atrophy and small vessel ischemic changes.  CT abdomen:  IMPRESSION: No acute findings in the abdomen or pelvis.   Micro Data:  Blood cultures pending UA ordered   Antimicrobials:  Vanc  Cefepime Flagyl Interim history/subjective:    Objective   Blood pressure 138/71, pulse (!) 143, temperature (!) 100.9 F (38.3 C), temperature source Oral, resp. rate (!) 45, height 5\' 8"  (1.727 m), weight 90.7 kg, SpO2 92 %.       No intake or output data in the 24 hours ending 09/10/19 0235 Filed Weights   09/09/19 2051  Weight: 90.7 kg    Examination: General: appears uncomfortable, intermittently tachypneic HENT: dry blood crusted on lips, lesion on nasal skin- dark clot present Lungs: CTAB Cardiovascular: Tachycardic  Abdomen: mild tenderness, + bs Extremities: non pitting edema Neuro: awake alert non  focal   Resolved Hospital Problem list     Assessment & Plan:  59 yo woman with AML, here with neutropenic fever, gi bleeding, thrombocytopenia.  Neutropenic fever: unclear source of infection.  Broad spectrum antibiotics.  Cont fluid resuscitation.  Refusing tylenol (itching per chart, though she tells me she was told to avoid tylenol due to hemangiomas)  GI bleeding: thrombocytopenia, undergoing platelet transfusion now.  PRBCs pending.  GI consult in AM.  Protonix BID  AML: chemo last given 8/31.   HTN: hold antiHTN meds.  (HCTZ, cozaar as well. )  Anxiety/depression: held po meds;  lexapro 10mg  daily. Ativan prn, trazodone, Tramadol  Pain controL: some pain in back and LE (chronic?).  Low dose dilaudid prn.    Best practice:  Diet: ice chips and water Pain/Anxiety/Delirium protocol (if indicated): na  VAP protocol (if indicated): na  DVT prophylaxis: scd as tolerates GI prophylaxis: protonix Glucose control:  Mobility: bed Code Status: full Family Communication:  Disposition: ICU  Labs   CBC: Recent Labs  Lab 09/03/19 1121 09/06/19 1223 09/09/19 2115 09/10/19 0109  WBC 2.5* 0.3* <0.1*  --   NEUTROABS 2.1 0.1* Not Measured  --   HGB 11.8* 10.5* 9.4* 7.6*  HCT 37.5 32.9* 28.4* 22.9*  MCV 104.7* 102.8* 99.6  --   PLT 69* 12* <5*  --     Basic Metabolic Panel: Recent Labs  Lab 09/03/19 1121 09/06/19 1223 09/09/19 2115 09/10/19 0058  NA 143 140  135  --   K 3.7 3.8 3.0*  --   CL 105 107 102  --   CO2 31 26 23   --   GLUCOSE 78 198* 148*  --   BUN 11 13 14   --   CREATININE 0.62 0.63 0.56  --   CALCIUM 10.3 9.4 9.0  --   MG  --   --   --  1.4*   GFR: Estimated Creatinine Clearance: 88.1 mL/min (by C-G formula based on SCr of 0.56 mg/dL). Recent Labs  Lab 09/03/19 1121 09/06/19 1223 09/09/19 2114 09/09/19 2115 09/09/19 2307  WBC 2.5* 0.3*  --  <0.1*  --   LATICACIDVEN  --   --  2.1*  --  2.8*    Liver Function Tests: Recent Labs  Lab  09/03/19 1121 09/06/19 1223 09/09/19 2115  AST 42* 20 50*  ALT 28 18 39  ALKPHOS 95 103 86  BILITOT 1.2 1.3* 1.5*  PROT 6.4* 6.0* 6.3*  ALBUMIN 3.4* 3.2* 3.2*   No results for input(s): LIPASE, AMYLASE in the last 168 hours. No results for input(s): AMMONIA in the last 168 hours.  ABG    Component Value Date/Time   TCO2 27 07/01/2019 0040     Coagulation Profile: Recent Labs  Lab 09/09/19 2115  INR 1.1    Cardiac Enzymes: No results for input(s): CKTOTAL, CKMB, CKMBINDEX, TROPONINI in the last 168 hours.  HbA1C: Hgb A1c MFr Bld  Date/Time Value Ref Range Status  01/18/2019 10:25 AM 5.4 4.8 - 5.6 % Final    Comment:             Prediabetes: 5.7 - 6.4          Diabetes: >6.4          Glycemic control for adults with diabetes: <7.0     CBG: No results for input(s): GLUCAP in the last 168 hours.  Review of Systems:   Unable to assess  Past Medical History  She,  has a past medical history of Anxiety, Cancer ([place]), Hypertension, and IBS (irritable bowel syndrome).   Surgical History    Past Surgical History:  Procedure Laterality Date  . GASTRIC BYPASS  2013  . NISSEN FUNDOPLICATION  0960   Reversed in 2013  . ORIF ANKLE FRACTURE Left 06/2017  . PLACEMENT OF BREAST IMPLANTS       Social History   reports that she has quit smoking. She has never used smokeless tobacco. She reports previous alcohol use. She reports previous drug use.   Family History   Her family history includes Colon cancer in her maternal aunt; Diabetes in her maternal uncle; Pancreatic cancer in her father and maternal grandfather.   Allergies Allergies  Allergen Reactions  . Ace Inhibitors     Other reaction(s): Cough  . Lisinopril Cough  . Tylenol [Acetaminophen] Hives and Itching    Pt. States she only itches from tylenol      Home Medications  Prior to Admission medications   Medication Sig Start Date End Date Taking? Authorizing Provider  losartan (COZAAR) 25 MG tablet  Take 1 tablet (25 mg total) by mouth daily. 05/02/19  Yes Shalhoub, Sherryll Burger, MD  cyclobenzaprine (FLEXERIL) 5 MG tablet Take 1 tablet (5 mg total) by mouth 3 (three) times daily as needed for muscle spasms. Patient not taking: Reported on 07/29/2019 05/01/19   Vernelle Emerald, MD  entecavir (BARACLUDE) 0.5 MG tablet Take 0.5 mg by mouth daily.  06/01/19 05/31/20  [provider]  hydrochlorothiazide (HYDRODIURIL) 25 MG tablet Take 25 mg by mouth daily.  Patient not taking: Reported on 09/09/2019 06/01/19 05/31/20  [provider]  LORazepam (ATIVAN) 1 MG tablet Take 1 mg by mouth at bedtime as needed for anxiety or sleep.  Patient not taking: Reported on 09/09/2019 06/18/19   [provider]  metroNIDAZOLE (FLAGYL) 500 MG tablet Take 1 tablet (500 mg total) by mouth every 8 (eight) hours. Patient not taking: Reported on 09/09/2019 08/03/19   Shawna Clamp, MD  Multiple Vitamin (MULTIVITAMIN WITH MINERALS) TABS tablet Take 1 tablet by mouth daily. 07/04/19   Sheikh, Omair Latif, DO  ondansetron (ZOFRAN) 4 MG tablet Take 1 tablet (4 mg total) by mouth every 6 (six) hours as needed for nausea. 07/03/19   Sheikh, Georgina Quint Latif, DO  polyethylene glycol (MIRALAX / GLYCOLAX) 17 g packet Take 17 g by mouth daily as needed for moderate constipation or severe constipation. 07/03/19   Sheikh, Omair Latif, DO  senna-docusate (SENOKOT-S) 8.6-50 MG tablet Take 1 tablet by mouth at bedtime. 07/03/19   Sheikh, Georgina Quint Latif, DO  traMADol (ULTRAM) 50 MG tablet Take 1 tablet (50 mg total) by mouth every 8 (eight) hours as needed (mild pain). 07/03/19   Raiford Noble Latif, DO  traZODone (DESYREL) 50 MG tablet Take 50 mg by mouth at bedtime as needed for sleep.  05/31/19   [provider]     Critical care time: 45 minutes.

## 2019-09-10 NOTE — ED Notes (Addendum)
Pt has had 2 dark bloody BMs in approximately 20 minutes. Pt cleaned up by self and Max NT. No BP taken during this time.

## 2019-09-10 NOTE — Progress Notes (Signed)
VAST to ICU 1229 to assess for placement of 3rd IV access. ICU nurses getting pt settled and monitors in place. Asked that VAST RN return after 30 mins.

## 2019-09-10 NOTE — Progress Notes (Signed)
VAST responded to ER to obtain 3rd IV access for patient. Pt being transported to ICU.

## 2019-09-10 NOTE — Progress Notes (Signed)
[facility] Progress Note Patient Name: Kelly Roberson DOB: [birth year] MRN: [id number]   Date of Service  09/10/2019  HPI/Events of Note  Notified of hypertension, BP 171/97 HR 90s-110s  eICU Interventions  Ordered labetalol 10 mg IV prn     Intervention Category Major Interventions: Arrhythmia - evaluation and management Intermediate Interventions: Hypertension - evaluation and management  Judd Lien 09/10/2019, 11:59 PM

## 2019-09-10 NOTE — Progress Notes (Signed)
[facility] Progress Note Patient Name: Kelly Roberson DOB: 1960/08/28 MRN: [id number]   Date of Service  09/10/2019  HPI/Events of Note  ED doc discussed about [name] case . 59 yr old F. 1) AML recent chemo, Pancytopenia now with Neutropenic sepsis, no clear source, 2). GI bleed. CT abd: no entero colitis. sinus tachy. SBP 150's. ED doc asking to get ICU admission. Hem/onc: adviced 3 platelet ( Platelet 5 K), Vanc/cefpime. LA > 2. Hg 9.4.  [place] pending for head ache.  Covid neg.    eICU Interventions  - notified bed side Dr Mariane Masters, he will go to [facility] ED. No bed in ICU at [facility] or in [facility] at this time.       Intervention Category Major Interventions: Hemorrhage - evaluation and management Intermediate Interventions: Communication with other healthcare providers and/or family;Thrombocytopenia - evaluation and management  Elmer Sow 09/10/2019, 12:55 AM

## 2019-09-10 NOTE — Progress Notes (Signed)
Pharmacy Antibiotic Note  Kelly Roberson is a 59 y.o. female admitted on 09/09/2019 with febrile neutropenia.  Pharmacy has been consulted for vancomycin and cefepime dosing.  On admission, pt is pancytopenic, likely due to recent chemo administration. Pegfilgastrim given on 8/31   Plan:  Vancomycin 2000 mg IV x1 in ED, then 1000 mg IV q12h   Cefepime 2 gr IV q8h   Monitor clinical course, renal function, cultures as available    Height: 5\' 8"  (172.7 cm) Weight: 90.7 kg (199 lb 15.3 oz) IBW/kg (Calculated) : 63.9  Temp (24hrs), Avg:101.2 F (38.4 C), Min:99.7 F (37.6 C), Max:103.1 F (39.5 C)  Recent Labs  Lab 09/03/19 1121 09/06/19 1223 09/09/19 2114 09/09/19 2115 09/09/19 2307  WBC 2.5* 0.3*  --  <0.1*  --   CREATININE 0.62 0.63  --  0.56  --   LATICACIDVEN  --   --  2.1*  --  2.8*    Estimated Creatinine Clearance: 88.1 mL/min (by C-G formula based on SCr of 0.56 mg/dL).    Allergies  Allergen Reactions  . Ace Inhibitors     Other reaction(s): Cough  . Lisinopril Cough  . Tylenol [Acetaminophen] Hives and Itching    Pt. States she only itches from tylenol     Antimicrobials this admission: 9/7 vancomycin >>  9/7 cefepime >>   Dose adjustments this admission:   Microbiology results: 9/7 BCx:  9/7 UCx:   9/7 COVID-19 Negative 9/8 C. Diff negative  9/8 GI panel: IP   Thank you for allowing pharmacy to be a part of this patient's care.  Royetta Asal, PharmD, BCPS 09/10/2019 2:16 AM

## 2019-09-10 NOTE — Progress Notes (Signed)
PCCM progress note  Patient seen and examined after arrival in ICU She is awake, oriented, hemodynamically stable Complains of mild abdominal pain.  Blood pressure 128/76, pulse 96, temperature 98.1 F (36.7 C), temperature source Axillary, resp. rate (!) 21, height 5\' 8"  (1.727 m), weight 90.7 kg, SpO2 94 %. Gen:      No acute distress HEENT:  EOMI, sclera anicteric crusted dried blood around lips and nostrils Neck:     No masses; no thyromegaly Lungs:    Clear to auscultation bilaterally; normal respiratory effort CV:         Regular rate and rhythm; no murmurs Abd:      + bowel sounds; soft, non-tender; no palpable masses, no distension Ext:    No edema; adequate peripheral perfusion Skin:      Warm and dry; no rash Neuro: alert and oriented x 3 Psych: normal mood and affect  Labs/imaging CT abdomen pelvis 09/09/2019-no acute abnormality CT head 09/10/2019-no acute abnormality  WBC less than 0.1, hemoglobin 5.1, platelets 9  Assessment/plan 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 We will inform the oncology team of her admission.  Anemia Concern for GI bleed but she has diffuse mucosal bleed from mouth, nose and rectum. Previously evaluated by GI for similar admission who did not recommend endoscopic evaluation due to thrombocytopenia.  We will hold off on GI consult for now as I do not think scope would help her at this point.   CT abdomen pelvis yesterday was unremarkable. Transfuse PRBC, platelets.  Follow CBC. Continue Protonix  History of colitis, typhlitis Concern for sepsis during this admission with elevated lactic acid Continue vancomycin, cefepime, metronidazole Fluid resuscitation Hold antihypertension meds  The patient is critically ill with multiple organ system failure and requires high complexity decision making for assessment and support, frequent evaluation and titration of therapies, advanced  monitoring, review of radiographic studies and interpretation of complex data.   Critical Care Time devoted to patient care services, exclusive of separately billable procedures, described in this note is 35 minutes.   Marshell Garfinkel MD [facility] Please see [email or web address] for pager details.  09/10/2019, 10:27 AM

## 2019-09-10 NOTE — Progress Notes (Signed)
PHARMACY - PHYSICIAN COMMUNICATION CRITICAL VALUE ALERT - BLOOD CULTURE IDENTIFICATION (BCID)  Kelly Roberson is an 59 y.o. female with AML who presented to [facility] on 09/09/2019 with a chief complaint of fever, diarrhea, nausea, generalized abdominal pain.   Assessment: febrile neutropenia, unclear source of infection  Name of physician (or Provider) Contacted: Dr. Vaughan Browner  Current antibiotics: Vancomycin, Cefepime, Metronidazole   Changes to prescribed antibiotics recommended:  Recommendations accepted by provider   Narrow to Cefepime. Discontinue Vancomycin, Metronidazole.   Results for orders placed or performed during the hospital encounter of 09/09/19  Blood Culture ID Panel (Reflexed) (Collected: 09/09/2019  9:22 PM)  Result Value Ref Range   Enterococcus faecalis NOT DETECTED NOT DETECTED   Enterococcus Faecium NOT DETECTED NOT DETECTED   Listeria monocytogenes NOT DETECTED NOT DETECTED   Staphylococcus species NOT DETECTED NOT DETECTED   Staphylococcus aureus (BCID) NOT DETECTED NOT DETECTED   Staphylococcus epidermidis NOT DETECTED NOT DETECTED   Staphylococcus lugdunensis NOT DETECTED NOT DETECTED   Streptococcus species NOT DETECTED NOT DETECTED   Streptococcus agalactiae NOT DETECTED NOT DETECTED   Streptococcus pneumoniae NOT DETECTED NOT DETECTED   Streptococcus pyogenes NOT DETECTED NOT DETECTED   A.calcoaceticus-baumannii NOT DETECTED NOT DETECTED   Bacteroides fragilis NOT DETECTED NOT DETECTED   Enterobacterales DETECTED (A) NOT DETECTED   Enterobacter cloacae complex DETECTED (A) NOT DETECTED   Escherichia coli NOT DETECTED NOT DETECTED   Klebsiella aerogenes NOT DETECTED NOT DETECTED   Klebsiella oxytoca NOT DETECTED NOT DETECTED   Klebsiella pneumoniae NOT DETECTED NOT DETECTED   Proteus species NOT DETECTED NOT DETECTED   Salmonella species NOT DETECTED NOT DETECTED   Serratia marcescens NOT DETECTED NOT DETECTED   Haemophilus influenzae NOT DETECTED NOT  DETECTED   Neisseria meningitidis NOT DETECTED NOT DETECTED   Pseudomonas aeruginosa NOT DETECTED NOT DETECTED   Stenotrophomonas maltophilia NOT DETECTED NOT DETECTED   Candida albicans NOT DETECTED NOT DETECTED   Candida auris NOT DETECTED NOT DETECTED   Candida glabrata NOT DETECTED NOT DETECTED   Candida krusei NOT DETECTED NOT DETECTED   Candida parapsilosis NOT DETECTED NOT DETECTED   Candida tropicalis NOT DETECTED NOT DETECTED   Cryptococcus neoformans/gattii NOT DETECTED NOT DETECTED   CTX-M ESBL NOT DETECTED NOT DETECTED   Carbapenem resistance IMP NOT DETECTED NOT DETECTED   Carbapenem resistance KPC NOT DETECTED NOT DETECTED   Carbapenem resistance NDM NOT DETECTED NOT DETECTED   Carbapenem resist OXA 48 LIKE NOT DETECTED NOT DETECTED   Carbapenem resistance VIM NOT DETECTED NOT DETECTED    [name] 09/10/2019  2:10 PM

## 2019-09-10 NOTE — Consult Note (Signed)
[place] [phone]   Fax:([phone] [place] NOTE  Patient Care Team: Ladell Pier, MD as PCP - General (Internal Medicine)  Hematological/Oncological History #Acute Myeloid Leukemia, Favorable Risk (RUNX1/RUNX1T1) 1) 04/30/2019: patient presented to [facility] ED with progressive weakness. Found to have pancytopenia and peripheral blasts with intracellular inclusions.  2) 05/01/2019: transferred to [facility] 3) 05/04/2019: patient started induction therapy for AML with 7+3 and gemtuzumab on Day 4. 4) 05/16/2019: repeat Bmbx shows no residual leukemia 5) 06/17/2019: patient returned for Cycle 1 Consolidation with cytarabine and gemtuzumab at [facility] 6) 06/24/2019: presents to reconnect with [facility] for co-managed care. 7) 07/15/2019: start of Cycle 2 of consolidation therapyat [facility]  8) 07/30/2019: admitted for BRBPR/ Infectious colitis.  9) 08/26/2019: start of Cycle 3 of consolidation therapy at [facility]  10) 09/09/2019: admitted with fever and BRBPR  CHIEF COMPLAINTS/PURPOSE OF CONSULTATION:  "Fever "  HISTORY OF PRESENTING ILLNESS:  Kelly Roberson 59 y.o. female with medical history significant for favorable risk AML currently undergoing treatment at [facility] who presents for evaluation of fever.   On review of the previous records Mrs. Konicki presented to the Emergency Department last night for a fever (reportedly 100.9 F). Her bloodwork showed a WBC <0.1, Hgb 9.4, MCV 104, Plt <5. CT abdomen showed no acute findings in the abdomen or pelvis. Shortly after arrival she had a large bloody bowel movement. Subsequent check of her counts showed a Hgb 5.1. Due to concern for her febrile neutropenia and GI symptoms she was started on vanc/cefepime/flagyl. She received 2 units of Plt overnight and 1 additional unit if currently running. Based off her AM Hgb she is currently scheduled to receive 2 units of PRBC.   On exam today [place]  notes that she feels weak.  She reports that she had marked bleeding from a shave biopsy was performed from a lesion on her nose.  She notes that she lost quite a bit of blood and went through at least a small tissue box of tissues trying to control the bleeding.  She notes that she had her first bloody bowel movement last night and that prior to that it was predominantly green stools.  She notes that she is febrile and recorded temperature at home of 100.6 Fahrenheit which prompted her to come into the emergency department.  She notes that she is not having any focal symptoms of cough, sweats, nausea, vomiting, or abdominal pain.  She is having the loose bloody stools.  A full 10 point ROS is listed below.  MEDICAL HISTORY:  Past Medical History:  Diagnosis Date  . Anxiety   . Cancer ([place])   . Hypertension   . IBS (irritable bowel syndrome)     SURGICAL HISTORY: Past Surgical History:  Procedure Laterality Date  . GASTRIC BYPASS  2013  . NISSEN FUNDOPLICATION  9323   Reversed in 2013  . ORIF ANKLE FRACTURE Left 06/2017  . PLACEMENT OF BREAST IMPLANTS      SOCIAL HISTORY: Social History   Socioeconomic History  . Marital status: Divorced    Spouse name: Not on file  . Number of children: Not on file  . Years of education: Not on file  . Highest education level: Not on file  Occupational History  . Not on file  Tobacco Use  . Smoking status: Former Research scientist (life sciences)  . Smokeless tobacco: Never Used  Substance and Sexual Activity  . Alcohol use: Not Currently  Comment: occasionally   . Drug use: Not Currently  . Sexual activity: Not Currently  Other Topics Concern  . Not on file  Social History Narrative  . Not on file   Social Determinants of Health   Financial Resource Strain:   . Difficulty of Paying Living Expenses: Not on file  Food Insecurity:   . Worried About Charity fundraiser in the Last Year: Not on file  . Ran Out of Food in the Last Year: Not on file    Transportation Needs:   . Lack of Transportation (Medical): Not on file  . Lack of Transportation (Non-Medical): Not on file  Physical Activity:   . Days of Exercise per Week: Not on file  . Minutes of Exercise per Session: Not on file  Stress:   . Feeling of Stress : Not on file  Social Connections:   . Frequency of Communication with Friends and Family: Not on file  . Frequency of Social Gatherings with Friends and Family: Not on file  . Attends Religious Services: Not on file  . Active Member of Clubs or Organizations: Not on file  . Attends Archivist Meetings: Not on file  . Marital Status: Not on file  Intimate Partner Violence:   . Fear of Current or Ex-Partner: Not on file  . Emotionally Abused: Not on file  . Physically Abused: Not on file  . Sexually Abused: Not on file    FAMILY HISTORY: Family History  Problem Relation Age of Onset  . Pancreatic cancer Father   . Colon cancer Maternal Aunt   . Diabetes Maternal Uncle   . Pancreatic cancer Maternal Grandfather     ALLERGIES:  is allergic to ace inhibitors, lisinopril, and tylenol [acetaminophen].  MEDICATIONS:  Current Facility-Administered Medications  Medication Dose Route Frequency Provider Last Rate Last Admin  . 0.9 % NaCl with KCl 40 mEq / L  infusion   Intravenous Continuous Collier Bullock, MD 125 mL/hr at 09/10/19 0928 New Bag at 09/10/19 8127  . acetaminophen (TYLENOL) tablet 650 mg  650 mg Oral Q6H PRN Shela Leff, MD      . ceFEPIme (MAXIPIME) 2 g in sodium chloride 0.9 % 100 mL IVPB  2 g Intravenous Q8H Shela Leff, MD   Stopped at 09/10/19 5170  . Chlorhexidine Gluconate Cloth 2 % PADS 6 each  6 each Topical Daily Mannam, Praveen, MD      . diphenhydrAMINE (BENADRYL) injection 25 mg  25 mg Intravenous Q6H PRN Shela Leff, MD   25 mg at 09/10/19 0146  . docusate sodium (COLACE) capsule 100 mg  100 mg Oral BID PRN Collier Bullock, MD      . HYDROmorphone (DILAUDID)  injection 0.5 mg  0.5 mg Intravenous Q3H PRN Collier Bullock, MD   0.5 mg at 09/10/19 0745  . metroNIDAZOLE (FLAGYL) IVPB 500 mg  500 mg Intravenous Q8H Collier Bullock, MD   Paused at 09/10/19 [phone]  . ondansetron (ZOFRAN) injection 4 mg  4 mg Intravenous Q6H PRN Collier Bullock, MD      . pantoprazole (PROTONIX) injection 40 mg  40 mg Intravenous Q12H Collier Bullock, MD   40 mg at 09/10/19 0955  . polyethylene glycol (MIRALAX / GLYCOLAX) packet 17 g  17 g Oral Daily PRN Collier Bullock, MD      . vancomycin (VANCOCIN) IVPB 1000 mg/200 mL premix  1,000 mg Intravenous Q12H Shela Leff, MD 200 mL/hr at 09/10/19 0753 1,000 mg at 09/10/19 0753  REVIEW OF SYSTEMS:   Constitutional: (+++ ) fevers, ( - )  chills , ( - ) night sweats Eyes: ( - ) blurriness of vision, ( - ) double vision, ( - ) watery eyes Ears, nose, mouth, throat, and face: ( - ) mucositis, ( - ) sore throat Respiratory: ( - ) cough, ( - ) dyspnea, ( - ) wheezes Cardiovascular: ( - ) palpitation, ( - ) chest discomfort, ( - ) lower extremity swelling Gastrointestinal:  ( - ) nausea, ( - ) heartburn, ( - ) change in bowel habits Skin: ( - ) abnormal skin rashes Lymphatics: ( - ) new lymphadenopathy, ( - ) easy bruising Neurological: ( - ) numbness, ( - ) tingling, ( - ) new weaknesses Behavioral/Psych: ( - ) mood change, ( - ) new changes  All other systems were reviewed with the patient and are negative.  PHYSICAL EXAMINATION:  Vitals:   09/10/19 1031 09/10/19 1037  BP:  128/78  Pulse: 96 (!) 104  Resp: (!) 24 (!) 34  Temp: 97.6 F (36.4 C)   SpO2: 94% 96%   Filed Weights   09/09/19 2051  Weight: 199 lb 15.3 oz (90.7 kg)    GENERAL: chronically ill appearing middle aged Caucasian female in NAD. Dried blood over nose and lips.  SKIN: skin color, texture, turgor are normal, no rashes or significant lesions EYES: conjunctiva are pink and non-injected, sclera clear LUNGS: clear to auscultation and  percussion with normal breathing effort HEART: regular rate & rhythm and no murmurs and no lower extremity edema Musculoskeletal: no cyanosis of digits and no clubbing  PSYCH: alert & oriented x 3, fluent speech NEURO: no focal motor/sensory deficits  LABORATORY DATA:  I have reviewed the data as listed CBC Latest Ref Rng & Units 09/10/2019 09/10/2019 09/09/2019  WBC 4.0 - 10.5 K/uL <0.1(LL) - <0.1(LL)  Hemoglobin 12.0 - 15.0 g/dL 5.1(LL) 7.6(L) 9.4(L)  Hematocrit 36 - 46 % 15.7(L) 22.9(L) 28.4(L)  Platelets 150 - 400 K/uL 9(LL) - <5(LL)    CMP Latest Ref Rng & Units 09/10/2019 09/09/2019 09/06/2019  Glucose 70 - 99 mg/dL 131(H) 148(H) 198(H)  BUN 6 - 20 mg/dL 12 14 13   Creatinine 0.44 - 1.00 mg/dL 0.35(L) 0.56 0.63  Sodium 135 - 145 mmol/L 140 135 140  Potassium 3.5 - 5.1 mmol/L 4.8 3.0(L) 3.8  Chloride 98 - 111 mmol/L 120(H) 102 107  CO2 22 - 32 mmol/L 14(L) 23 26  Calcium 8.9 - 10.3 mg/dL 5.8(LL) 9.0 9.4  Total Protein 6.5 - 8.1 g/dL - 6.3(L) 6.0(L)  Total Bilirubin 0.3 - 1.2 mg/dL - 1.5(H) 1.3(H)  Alkaline Phos 38 - 126 U/L - 86 103  AST 15 - 41 U/L - 50(H) 20  ALT 0 - 44 U/L - 39 18     RADIOGRAPHIC STUDIES:  CT Head Wo Contrast  Result Date: 09/10/2019 CLINICAL DATA:  Headache. Tremor and fever starting yesterday. History of leukemia. Chemotherapy last Wednesday. Nose surgery on Friday. EXAM: CT HEAD WITHOUT CONTRAST TECHNIQUE: Contiguous axial images were obtained from the base of the skull through the vertex without intravenous contrast. COMPARISON:  07/01/2019 FINDINGS: Brain: Mild cerebral atrophy. No ventricular dilatation. Patchy low-attenuation changes in the deep white matter consistent with small vessel ischemic changes and lacunar infarcts. No mass effect or midline shift. No abnormal extra-axial fluid collections. Gray-white matter junctions are distinct. Basal cisterns are not effaced. No acute intracranial hemorrhage. Vascular: Intracranial arterial vascular calcifications  are present. Skull: The  calvarium appears intact. Sinuses/Orbits: Paranasal sinuses and mastoid air cells are clear. Other: None. IMPRESSION: 1. No acute intracranial abnormalities. 2. Chronic atrophy and small vessel ischemic changes. Electronically Signed   By: Lucienne Capers M.D.   On: 09/10/2019 01:05   CT ABDOMEN PELVIS W CONTRAST  Result Date: 09/09/2019 CLINICAL DATA:  Leukemia.  Recent chemotherapy.  Fever. EXAM: CT ABDOMEN AND PELVIS WITH CONTRAST TECHNIQUE: Multidetector CT imaging of the abdomen and pelvis was performed using the standard protocol following bolus administration of intravenous contrast. CONTRAST:  16mL OMNIPAQUE IOHEXOL 300 MG/ML  SOLN COMPARISON:  07/29/2019 FINDINGS: Lower chest: Linear scarring or atelectasis in the right lung base. No effusions or acute abnormality. Hepatobiliary: Low-density lesion posteriorly in the liver is stable since prior study measuring 1.7 cm. This is likely hemangioma. No suspicious Vac abnormality. Gallbladder unremarkable. Pancreas: No focal abnormality or ductal dilatation. Spleen: No focal abnormality.  Normal size. Adrenals/Urinary Tract: No adrenal abnormality. No focal renal abnormality. No stones or hydronephrosis. Urinary bladder is unremarkable. Stomach/Bowel: There is fatty proliferation within the right colonic wall, likely related to prior inflammatory process/infection. No evidence for active enteritis/colitis. Prior gastric bypass. Vascular/Lymphatic: No evidence of aneurysm or adenopathy. Scattered aortic calcifications. Reproductive: Uterus and adnexa unremarkable.  No mass. Other: No free fluid or free air. Musculoskeletal: No acute bony abnormality. IMPRESSION: No acute findings in the abdomen or pelvis. Electronically Signed   By: Rolm Baptise M.D.   On: 09/09/2019 22:12   DG Chest Port 1 View  Result Date: 09/09/2019 CLINICAL DATA:  Leukemia patient.  Fever.  Questionable sepsis. EXAM: PORTABLE CHEST 1 VIEW COMPARISON:   07/29/2019 FINDINGS: Cardiac silhouette is normal in size. No mediastinal or hilar masses or evidence of adenopathy. Clear lungs.  No pleural effusion or pneumothorax. Skeletal structures are grossly intact. IMPRESSION: No active disease. Electronically Signed   By: Lajean Manes M.D.   On: 09/09/2019 20:58    ASSESSMENT & PLAN Keniah Klemmer 59 y.o. female with medical history significant for favorable risk AML currently undergoing treatment at [facility] who presents for evaluation of fever.  Review the labs, review the records, discussion with the patient her findings are most consistent with a febrile neutropenia of unclear etiology.  She has received her G-CSF shots on 09/02/2019 and therefore we are currently waiting for her white blood cell count to rise.  She does note that she is having some bone pain consistent with G-CSF administration.  This is something that she tends to experience shortly before her counts rise.  Fortunately has also developed bright red blood per rectum in the interim since admission.  Her hemoglobin has dropped precipitously down from 9.4 down to 5.1.  At this time I would recommend strong supportive care with transfusion to a platelet goal greater than 20 and maintaining a hemoglobin of at least 7.0.  I am in agreement with empiric antibiotics at this time while we await culture data.  Overall her clinical course is similar to that of her prior admissions.  Fortunately this should be the last consolidation cycle and afterwards her primary oncologist is considering oral azacitidine as maintenance therapy.   #Febrile Neutropenia --agree with continued antibiotic therapy with vanc, cefepime, and metronidzole.  -- no clear source of infection noted at this time.  --received neulasta on 09/02/2019, no clear indication for repeat GCSF --f/u collected cultures, continue to monitor  #Bright Red Blood Per Rectum --please maintain Hgb goal of 7.0 with increased  platelet goal of  20 (in setting of active bleed) --GI consulted during prior admission for similar findings. Agree that patient is not a candidate for endoscopy at this time.  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 Elmo Putt at [facility]  All questions were answered. The patient knows to call the clinic with any problems, questions or concerns.  A total of more than 50 minutes were spent on this encounter and over half of that time was spent on counseling and coordination of care as outlined above.   Ledell Peoples, MD Department of Hematology/Oncology [place] at [facility] Phone: [phone] Pager: [phone] Email: [email or web address]  09/10/2019 11:48 AM

## 2019-09-10 NOTE — ED Notes (Signed)
Lab draw unsuccessful 

## 2019-09-10 NOTE — Progress Notes (Signed)
VAST consulted to obtain IV access. Called unit and spoke with secretary who relayed message that pt needs a third IV access d/t "platelets being low". Advised IV team will be there as soon as able to assess.

## 2019-09-11 ENCOUNTER — Inpatient Hospital Stay (HOSPITAL_COMMUNITY): Payer: Medicaid Other

## 2019-09-11 ENCOUNTER — Inpatient Hospital Stay: Payer: Self-pay

## 2019-09-11 DIAGNOSIS — D62 Acute posthemorrhagic anemia: Secondary | ICD-10-CM

## 2019-09-11 DIAGNOSIS — D61818 Other pancytopenia: Secondary | ICD-10-CM

## 2019-09-11 DIAGNOSIS — A4150 Gram-negative sepsis, unspecified: Secondary | ICD-10-CM

## 2019-09-11 DIAGNOSIS — I10 Essential (primary) hypertension: Secondary | ICD-10-CM

## 2019-09-11 DIAGNOSIS — C9201 Acute myeloblastic leukemia, in remission: Secondary | ICD-10-CM

## 2019-09-11 LAB — PREPARE PLATELET PHERESIS
Unit division: 0
Unit division: 0
Unit division: 0

## 2019-09-11 LAB — URINE CULTURE: Culture: NO GROWTH

## 2019-09-11 LAB — HEPATIC FUNCTION PANEL
ALT: 76 U/L — ABNORMAL HIGH (ref 0–44)
AST: 92 U/L — ABNORMAL HIGH (ref 15–41)
Albumin: 2.7 g/dL — ABNORMAL LOW (ref 3.5–5.0)
Alkaline Phosphatase: 48 U/L (ref 38–126)
Bilirubin, Direct: 0.6 mg/dL — ABNORMAL HIGH (ref 0.0–0.2)
Indirect Bilirubin: 1 mg/dL — ABNORMAL HIGH (ref 0.3–0.9)
Total Bilirubin: 1.6 mg/dL — ABNORMAL HIGH (ref 0.3–1.2)
Total Protein: 5.2 g/dL — ABNORMAL LOW (ref 6.5–8.1)

## 2019-09-11 LAB — CBC
HCT: 25.7 % — ABNORMAL LOW (ref 36.0–46.0)
Hemoglobin: 8.8 g/dL — ABNORMAL LOW (ref 12.0–15.0)
MCH: 31.5 pg (ref 26.0–34.0)
MCHC: 34.2 g/dL (ref 30.0–36.0)
MCV: 92.1 fL (ref 80.0–100.0)
Platelets: 11 10*3/uL — CL (ref 150–400)
RBC: 2.79 MIL/uL — ABNORMAL LOW (ref 3.87–5.11)
RDW: 20.1 % — ABNORMAL HIGH (ref 11.5–15.5)
WBC: 0.3 10*3/uL — CL (ref 4.0–10.5)
nRBC: 0 % (ref 0.0–0.2)

## 2019-09-11 LAB — CBC WITH DIFFERENTIAL/PLATELET
Abs Immature Granulocytes: 0 10*3/uL (ref 0.00–0.07)
Basophils Absolute: 0 10*3/uL (ref 0.0–0.1)
Basophils Relative: 0 %
Eosinophils Absolute: 0 10*3/uL (ref 0.0–0.5)
Eosinophils Relative: 0 %
HCT: 25.3 % — ABNORMAL LOW (ref 36.0–46.0)
Hemoglobin: 8.2 g/dL — ABNORMAL LOW (ref 12.0–15.0)
Immature Granulocytes: 0 %
Lymphocytes Relative: 92 %
Lymphs Abs: 0.1 10*3/uL — ABNORMAL LOW (ref 0.7–4.0)
MCH: 31.7 pg (ref 26.0–34.0)
MCHC: 32.4 g/dL (ref 30.0–36.0)
MCV: 97.7 fL (ref 80.0–100.0)
Monocytes Absolute: 0 10*3/uL — ABNORMAL LOW (ref 0.1–1.0)
Monocytes Relative: 8 %
Neutro Abs: 0 10*3/uL — ABNORMAL LOW (ref 1.7–7.7)
Neutrophils Relative %: 0 %
Platelets: 10 10*3/uL — CL (ref 150–400)
RBC: 2.59 MIL/uL — ABNORMAL LOW (ref 3.87–5.11)
RDW: 20.3 % — ABNORMAL HIGH (ref 11.5–15.5)
WBC: 0.1 10*3/uL — CL (ref 4.0–10.5)
nRBC: 0 % (ref 0.0–0.2)

## 2019-09-11 LAB — TYPE AND SCREEN
ABO/RH(D): A POS
Antibody Screen: NEGATIVE
Unit division: 0
Unit division: 0

## 2019-09-11 LAB — BPAM RBC
Blood Product Expiration Date: 202110012359
Blood Product Expiration Date: 202110012359
ISSUE DATE / TIME: 202109071217
ISSUE DATE / TIME: 202109071521
Unit Type and Rh: 6200
Unit Type and Rh: 6200

## 2019-09-11 LAB — BPAM PLATELET PHERESIS
Blood Product Expiration Date: 202109082359
Blood Product Expiration Date: 202109082359
Blood Product Expiration Date: 202109092359
ISSUE DATE / TIME: 202109070104
ISSUE DATE / TIME: 202109070120
ISSUE DATE / TIME: 202109071002
Unit Type and Rh: 1700
Unit Type and Rh: 5100
Unit Type and Rh: 6200

## 2019-09-11 LAB — BASIC METABOLIC PANEL
Anion gap: 10 (ref 5–15)
BUN: 18 mg/dL (ref 6–20)
CO2: 18 mmol/L — ABNORMAL LOW (ref 22–32)
Calcium: 8.5 mg/dL — ABNORMAL LOW (ref 8.9–10.3)
Chloride: 113 mmol/L — ABNORMAL HIGH (ref 98–111)
Creatinine, Ser: 0.5 mg/dL (ref 0.44–1.00)
GFR calc Af Amer: >60 mL/min (ref >60)
GFR calc non Af Amer: >60 mL/min (ref >60)
Glucose, Bld: 115 mg/dL — ABNORMAL HIGH (ref 70–99)
Potassium: 4.9 mmol/L (ref 3.5–5.1)
Sodium: 141 mmol/L (ref 135–145)

## 2019-09-11 LAB — LEGIONELLA PNEUMOPHILA SEROGP 1 UR AG: L. pneumophila Serogp 1 Ur Ag: NEGATIVE

## 2019-09-11 MED ORDER — SODIUM CHLORIDE 0.9% FLUSH
10.0000 mL | Freq: Two times a day (BID) | INTRAVENOUS | Status: DC
  Administered 2019-09-12 (×3): 10 mL
  Administered 2019-09-13: 40 mL
  Administered 2019-09-13: 10 mL
  Administered 2019-09-14 (×2): 20 mL
  Administered 2019-09-15: 10 mL
  Administered 2019-09-15: 20 mL
  Administered 2019-09-16: 10 mL
  Administered 2019-09-17: 30 mL
  Administered 2019-09-18: 10 mL

## 2019-09-11 MED ORDER — FUROSEMIDE 10 MG/ML IJ SOLN
20.0000 mg | Freq: Once | INTRAMUSCULAR | Status: AC
  Administered 2019-09-11: 20 mg via INTRAVENOUS
  Filled 2019-09-11: qty 2

## 2019-09-11 MED ORDER — ADULT MULTIVITAMIN W/MINERALS CH
1.0000 | ORAL_TABLET | Freq: Every day | ORAL | Status: DC
  Administered 2019-09-11 – 2019-09-14 (×4): 1 via ORAL
  Filled 2019-09-11 (×7): qty 1

## 2019-09-11 MED ORDER — HYDROMORPHONE HCL 1 MG/ML IJ SOLN
0.5000 mg | INTRAMUSCULAR | Status: DC | PRN
  Administered 2019-09-11 – 2019-09-18 (×17): 0.5 mg via INTRAVENOUS
  Filled 2019-09-11 (×4): qty 0.5
  Filled 2019-09-11: qty 1
  Filled 2019-09-11 (×7): qty 0.5
  Filled 2019-09-11 (×2): qty 1
  Filled 2019-09-11 (×2): qty 0.5
  Filled 2019-09-11: qty 1
  Filled 2019-09-11: qty 0.5

## 2019-09-11 MED ORDER — ENTECAVIR 0.5 MG PO TABS: 0.5000 mg | ORAL_TABLET | Freq: Every day | ORAL | Status: DC

## 2019-09-11 MED ORDER — SODIUM CHLORIDE 0.9% FLUSH
10.0000 mL | INTRAVENOUS | Status: DC | PRN
  Administered 2019-09-13: 40 mL

## 2019-09-11 MED ORDER — SODIUM CHLORIDE 0.9% IV SOLUTION: Freq: Once | INTRAVENOUS | Status: AC

## 2019-09-11 NOTE — TOC Progression Note (Signed)
Transition of Care [facility]) - Progression Note    Patient Details  Name: Kelly Roberson MRN: [id number] Date of Birth: 1960-02-03  [facility] CM/SW Contact  Leeroy Cha, RN Phone Number: 09/11/2019, 8:03 AM  Clinical Narrative:    Pt with sepsis wbc 0.3, and platelets-10,[place] at 2l/min, rec'd one unit ppp [id number], [id number].  Will follow for progression and toc needs plan is to return home.   Expected Discharge Plan: Home/Self Care Barriers to Discharge: Continued Medical Work up  Expected Discharge Plan and Services Expected Discharge Plan: Home/Self Care   Discharge Planning Services: CM Consult   Living arrangements for the past 2 months: Single Family Home                                       Social Determinants of Health (SDOH) Interventions    Readmission Risk Interventions No flowsheet data found.

## 2019-09-11 NOTE — Progress Notes (Signed)
Peripherally Inserted Central Catheter Placement  The IV Nurse has discussed with the patient and/or persons authorized to consent for the patient, the purpose of this procedure and the potential benefits and risks involved with this procedure.  The benefits include less needle sticks, lab draws from the catheter, and the patient may be discharged home with the catheter. Risks include, but not limited to, infection, bleeding, blood clot (thrombus formation), and puncture of an artery; nerve damage and irregular heartbeat and possibility to perform a PICC exchange if needed/ordered by physician.  Alternatives to this procedure were also discussed.  Bard Power PICC patient education guide, fact sheet on infection prevention and patient information card has been provided to patient /or left at bedside.    PICC Placement Documentation  PICC Double Lumen 89/37/34 PICC Right Basilic 39 cm 0 cm (Active)  Indication for Insertion or Continuance of Line Poor Vasculature-patient has had multiple peripheral attempts or PIVs lasting less than 24 hours 09/11/19 1708  Exposed Catheter (cm) 0 cm 09/11/19 1708  Site Assessment Clean;Dry;Intact 09/11/19 1708  Lumen #1 Status Flushed;Blood return noted;Saline locked 09/11/19 1708  Lumen #2 Status Flushed;Blood return noted;Saline locked 09/11/19 1708  Dressing Type Transparent 09/11/19 1708  Dressing Status Clean;Dry;Intact;Antimicrobial disc in place 09/11/19 1708  Dressing Change Due 09/18/19 09/11/19 1708       Scotty Court 09/11/2019, 5:10 PM

## 2019-09-11 NOTE — Progress Notes (Signed)
[facility] Progress Note Patient Name: Kelly Roberson DOB: May 28, 1960 MRN: [id number]   Date of Service  09/11/2019  HPI/Events of Note  RN calls to clarify whether second unit of platelets should be given to this patient admitted with neutropenic sepsis and LGIB with latest platelet count of 10k. She is currently hemodynamically stable but on 6L [place] saturating in mid-high 90s. She was on 2L [place] earlier. CXR from earlier today shows possible pneumonia. She is on cefepime for enterobacter clocae in blood cultures.  On my exam, she is in no respiratory distress. She appears only mildly confused and answers most questions appropriately. Suspect delirium in setting of infection/critical illness.   eICU Interventions  OK to administer the second unit of platelets as ordered. Since she did not have an increase in platelet count after the first unit administered, we will draw a platelet count 30 mins after this transfusion to help assess for platelet refractoriness.     Intervention Category Intermediate Interventions: Respiratory distress - evaluation and management  Kelly Roberson [name] 09/11/2019, 9:40 PM

## 2019-09-11 NOTE — Progress Notes (Signed)
Patient A&O x3 at beginning of shift but saying some things that don't make sense. She is stating that she is seeing people and things in room and out window that are not there. On assessment, pt using accessory muscles to breath and on 6L O2 and coarse crackles noted in all lobes. Notified E-link. Will continue to monitor.

## 2019-09-11 NOTE — Progress Notes (Signed)
[facility] Progress Note Patient Name: Kelly Roberson DOB: 07/29/1960 MRN: [id number]   Date of Service  09/11/2019  HPI/Events of Note  Notified of oliguria Bladder scan with > 300 cc  eICU Interventions  In and out cath ordered for bladder scan > 500cc     Intervention Category Intermediate Interventions: Oliguria - evaluation and management  Judd Lien 09/11/2019, 4:55 AM

## 2019-09-11 NOTE — Progress Notes (Addendum)
NAME:  Kelly Roberson, MRN:  [id number], DOB:  14-Oct-1960, LOS: 2 ADMISSION DATE:  09/09/2019, CHIEF COMPLAINT:  BRBPR   Brief History   59 year old with AML in remission s/p induction with 7+3 s/p 3 cycles of consolidation HiDAC (most recently 08/28/19) who presents with febrile neutropenia and bright red blood per rectum.   History of present illness   See H+P dated 09/10/2019  Past Medical History  AML  Significant Hospital Events   Admitted 9/7 2u pRBC and 3 units platelets  Consults:  Oncology  Procedures:  n/a  Significant Diagnostic Tests:  CT abd/pelvis w/o colitis, no gallbladder/biliary abnormality  Micro Data:  Blood culture 9/6 w/ GNRs  Antimicrobials:  Vanc cefepime flagyl on admission narrowed to cefepime 9/7   Interim history/subjective:  Not sleeping well. Stools with BRBPR continue. Per RN last at midnight. Feels a little SOB. Hgb 7.6>>10 after 2u prbc. 8.8 this AM. Plts remain ~10 despite platelet transfusion.  Objective   Blood pressure (!) 173/90, pulse 89, temperature 97.7 F (36.5 C), temperature source Oral, resp. rate (!) 30, height 5\' 8"  (1.727 m), weight 95 kg, SpO2 95 %.        Intake/Output Summary (Last 24 hours) at 09/11/2019 0920 Last data filed at 09/11/2019 8366 Gross per 24 hour  Intake 4188.34 ml  Output 2380 ml  Net 1808.34 ml   Filed Weights   09/09/19 2051 09/11/19 0500  Weight: 90.7 kg 95 kg    Examination: General: chronically ill appearing, in NAD Eyes: EOMI, no icterus Lungs: Crackles in bases, NWOB on 2L [place] Cardiovascular: RRR, no murmurs Abdomen: soft, mild tenderness without localization Extremities: edema, warm Neuro: no weakness. CN intact  Resolved Hospital Problem list   n/a  Assessment & Plan:  Febrile Neutropenia and sepsis due to GNR bacteremia: --Cefepime to continue, awaiting sensitivities (enterobacterales, enterobacter cloacae complex)  Acute blood loss anemia: Recent Hgb 10.5, nadir at 7.6 this  admission. 8.8 this AM. Suspect mucosal oozing due to severe thrombocytopenia. Neutropenia and thrombocytopenia current contraindications to scope. Consider CTA if becomes brisk. --Tranfuse 2u platelets, goal > 20 --Trend Hgb, goal > 7  Chronic Anemia: Due to chronic disease, chemotherapy.  DOE/Acute hypoxemic respiratory Failure: SpO2 87% on RA in ED. Suspect due to sepsis as well as developing volume overload in setting of fluids and blood produicts. --Lasix 20 mg IV after platelets today --D/C MIVF  HTN: Holding home HCTZ, losartan given BRBPR. Bps creeping up. --Low threshold to resume given severe thrombocytopenia --PRN labetalol  Lactic Acidosis: due to hypovolemia/bleed and sepsis.   Best practice:  Diet: clears Pain/Anxiety/Delirium protocol (if indicated): dilaudid for bone pain VAP protocol (if indicated): n/a DVT prophylaxis: SCDs GI prophylaxis: PPI  Glucose control: n/a Mobility: OOB to chair Code Status: Full Family Communication: patient to update Disposition: ICU, if stable consider stepdown tomorrow  Labs   CBC: Recent Labs  Lab 09/06/19 1223 09/06/19 1223 09/09/19 2115 09/10/19 0109 09/10/19 0649 09/10/19 2108 09/11/19 0732  WBC 0.3*  --  <0.1*  --  <0.1* 0.3* 0.3*  NEUTROABS 0.1*  --  Not Measured  --   --  0.0*  --   HGB 10.5*  --  9.4* 7.6* 5.1* 10.5* 8.8*  HCT 32.9*   < > 28.4* 22.9* 15.7* 32.0* 25.7*  MCV 102.8*  --  99.6  --  104.0* 98.5 92.1  PLT 12*  --  <5*  --  9* 10* 11*   < > = values  in this interval not displayed.    Basic Metabolic Panel: Recent Labs  Lab 09/06/19 1223 09/09/19 2115 09/10/19 0058 09/10/19 0649 09/11/19 0732  NA 140 135  --  140 141  K 3.8 3.0*  --  4.8 4.9  CL 107 102  --  120* 113*  CO2 26 23  --  14* 18*  GLUCOSE 198* 148*  --  131* 115*  BUN 13 14  --  12 18  CREATININE 0.63 0.56  --  0.35* 0.50  CALCIUM 9.4 9.0  --  5.8* 8.5*  MG  --   --  1.4*  --   --    GFR: Estimated Creatinine Clearance: 90.1  mL/min (by C-G formula based on SCr of 0.5 mg/dL). Recent Labs  Lab 09/09/19 2114 09/09/19 2115 09/09/19 2307 09/10/19 0109 09/10/19 0649 09/10/19 2108 09/11/19 0732  WBC  --  <0.1*  --   --  <0.1* 0.3* 0.3*  LATICACIDVEN 2.1*  --  2.8* 3.3*  --   --   --     Liver Function Tests: Recent Labs  Lab 09/06/19 1223 09/09/19 2115 09/11/19 0732  AST 20 50* 92*  ALT 18 39 76*  ALKPHOS 103 86 48  BILITOT 1.3* 1.5* 1.6*  PROT 6.0* 6.3* 5.2*  ALBUMIN 3.2* 3.2* 2.7*   No results for input(s): LIPASE, AMYLASE in the last 168 hours. No results for input(s): AMMONIA in the last 168 hours.  ABG    Component Value Date/Time   HCO3 14.3 (L) 09/10/2019 0521   TCO2 27 07/01/2019 0040   ACIDBASEDEF 10.0 (H) 09/10/2019 0521   O2SAT 93.2 09/10/2019 0521     Coagulation Profile: Recent Labs  Lab 09/09/19 2115  INR 1.1    Cardiac Enzymes: No results for input(s): CKTOTAL, CKMB, CKMBINDEX, TROPONINI in the last 168 hours.  HbA1C: Hgb A1c MFr Bld  Date/Time Value Ref Range Status  01/18/2019 10:25 AM 5.4 4.8 - 5.6 % Final    Comment:             Prediabetes: 5.7 - 6.4          Diabetes: >6.4          Glycemic control for adults with diabetes: <7.0     CBG: No results for input(s): GLUCAP in the last 168 hours.  Review of Systems:   Abdominal pain a bit better. Feels SOB which is unusual.  Past Medical History  She,  has a past medical history of Anxiety, Cancer ([place]), Hypertension, and IBS (irritable bowel syndrome).   Surgical History    Past Surgical History:  Procedure Laterality Date   GASTRIC BYPASS  8119   NISSEN FUNDOPLICATION  1478   Reversed in 2013   ORIF ANKLE FRACTURE Left 06/2017   PLACEMENT OF BREAST IMPLANTS       Social History   reports that she has quit smoking. She has never used smokeless tobacco. She reports previous alcohol use. She reports previous drug use.   Family History   Her family history includes Colon cancer in her maternal  aunt; Diabetes in her maternal uncle; Pancreatic cancer in her father and maternal grandfather.   Allergies Allergies  Allergen Reactions   Ace Inhibitors     Other reaction(s): Cough   Lisinopril Cough   Tylenol [Acetaminophen] Hives and Itching    Pt. States she only itches from tylenol      Home Medications  Prior to Admission medications   Medication Sig Start  Date End Date Taking? Authorizing Provider  acyclovir (ZOVIRAX) 400 MG tablet Take 400 mg by mouth 2 (two) times daily. 09/01/19  Yes [provider]  Cholecalciferol 50 MCG (2000 UT) TABS Take 2,000 Units by mouth daily. 07/20/19 10/27/19 Yes [provider]  clonazePAM (KLONOPIN) 0.5 MG tablet Take 0.5 mg by mouth 2 (two) times daily as needed for anxiety. 09/02/19  Yes [provider]  entecavir (BARACLUDE) 0.5 MG tablet Take 0.5 mg by mouth daily.  06/01/19 05/31/20 Yes [provider]  folic acid (FOLVITE) 1 MG tablet Take 1 mg by mouth daily. 07/19/19 07/18/20 Yes [provider]  gabapentin (NEURONTIN) 300 MG capsule Take 900-1,200 mg by mouth See admin instructions. 900mg  with breakfast and lunch 1200mg  at night 07/19/19  Yes [provider]  hydrochlorothiazide (HYDRODIURIL) 25 MG tablet Take 25 mg by mouth daily.  06/01/19 05/31/20 Yes [provider]  levofloxacin (LEVAQUIN) 500 MG tablet Take 500 mg by mouth daily. 14 day supply 09/02/19  Yes [provider]  loperamide (IMODIUM) 2 MG capsule Take 2 mg by mouth as needed for diarrhea or loose stools.  09/02/19  Yes [provider]  losartan (COZAAR) 25 MG tablet Take 1 tablet (25 mg total) by mouth daily. 05/02/19  Yes Shalhoub, Sherryll Burger, MD  omeprazole (PRILOSEC) 20 MG capsule Take 20 mg by mouth daily.   Yes [provider]  polyethylene glycol (MIRALAX / GLYCOLAX) 17 g packet Take 17 g by mouth daily as needed for moderate constipation or severe constipation. 07/03/19  Yes Sheikh, Omair  Latif, DO  traMADol (ULTRAM) 50 MG tablet Take 1 tablet (50 mg total) by mouth every 8 (eight) hours as needed (mild pain). 07/03/19  Yes Sheikh, Omair Latif, DO  traZODone (DESYREL) 50 MG tablet Take 50 mg by mouth at bedtime as needed for sleep.  05/31/19  Yes [provider]  cyclobenzaprine (FLEXERIL) 5 MG tablet Take 1 tablet (5 mg total) by mouth 3 (three) times daily as needed for muscle spasms. Patient not taking: Reported on 07/29/2019 05/01/19   Vernelle Emerald, MD  LORazepam (ATIVAN) 1 MG tablet Take 1 mg by mouth at bedtime as needed for anxiety or sleep.  Patient not taking: Reported on 09/09/2019 06/18/19   [provider]  metroNIDAZOLE (FLAGYL) 500 MG tablet Take 1 tablet (500 mg total) by mouth every 8 (eight) hours. Patient not taking: Reported on 09/09/2019 08/03/19   Shawna Clamp, MD  Multiple Vitamin (MULTIVITAMIN WITH MINERALS) TABS tablet Take 1 tablet by mouth daily. 07/04/19   Sheikh, Omair Latif, DO  ondansetron (ZOFRAN) 4 MG tablet Take 1 tablet (4 mg total) by mouth every 6 (six) hours as needed for nausea. 07/03/19   Sheikh, Omair Latif, DO  senna-docusate (SENOKOT-S) 8.6-50 MG tablet Take 1 tablet by mouth at bedtime. Patient not taking: Reported on 09/10/2019 07/03/19   Kerney Elbe, DO     Critical care time:      CRITICAL CARE Performed by: Lanier Clam   Total critical care time: 45 minutes  Critical care time was exclusive of separately billable procedures and treating other patients.  Critical care was necessary to treat or prevent imminent or life-threatening deterioration.  Critical care was time spent personally by me on the following activities: development of treatment plan with patient and/or surrogate as well as nursing, discussions with consultants, evaluation of patient's response to treatment, examination of patient, obtaining history from patient or surrogate, ordering and performing  treatments and interventions,  ordering and review of laboratory studies, ordering and review of radiographic studies, pulse oximetry and re-evaluation of patient's condition.

## 2019-09-11 NOTE — Progress Notes (Signed)
Initial Nutrition Assessment  RD working remotely.  DOCUMENTATION CODES:   Not applicable  INTERVENTION:  - will order 1 tablet multivitamin with minerals/day. - diet advancement as medically feasible.  NUTRITION DIAGNOSIS:   Increased nutrient needs related to acute illness, chronic illness, cancer and cancer related treatments as evidenced by estimated needs.  GOAL:   Patient will meet greater than or equal to 90% of their needs  MONITOR:   Diet advancement, Labs, Weight trends, I & O's  REASON FOR ASSESSMENT:   Malnutrition Screening Tool  ASSESSMENT:   59 year-old female with medical history of AML currently on chemo, HTN, anxiety, and IBS. She presented to the ED with fever, chills, malaise, black/bloody diarrhea, abdominal pain, nausea, and non-productive cough.  She has been NPO since presenting to the ED on 9/6. She was assessed by another RD on 07/02/19 and again on 07/31/19. At that time it was noted that patient had gastric bypass surgery in 2013. She was experiencing fluctuations in appetite and did not want oral nutrition supplements ordered but was aware of need for increased kcal and protein intake d/t chemo treatments.   In June, patient had reported UBW of 225. Weight today is 209 lb which is consistent with weight January-April of this year. Weight in June and July of this year was ~196 lb.  Per notes: - receives Oncology care at [facility] - anemia with concern for GIB with mucosal bleeding from nose, mouth, and rectum--CT abd/pelvis unremarkable and no plan for scoping at this time - hx of colitis   Labs reviewed; Cl: 113 mmol/l, Ca: 8.5 mg/dl, LFTs slightly elevated. Medications reviewed; 40 mg IV protonix BID.    NUTRITION - FOCUSED PHYSICAL EXAM:  unable to complete at this time  Diet Order:   Diet Order            Diet NPO time specified Except for: Sips with Meds, Ice Chips  Diet effective now                 EDUCATION NEEDS:   No  education needs have been identified at this time  Skin:  Skin Assessment: Reviewed RN Assessment  Last BM:  9/8 (type 6 x1 and type 7 x1)  Height:   Ht Readings from Last 1 Encounters:  09/09/19 5\' 8"  (1.727 m)    Weight:   Wt Readings from Last 1 Encounters:  09/11/19 95 kg    Estimated Nutritional Needs:  Kcal:  9449-6759 kcal Protein:  110-120 grams Fluid:  >/= 2.2 L/day     Jarome Matin, MS, RD, LDN, CNSC Inpatient Clinical Dietitian RD pager # available in [facility]  After hours/weekend pager # available in [facility]

## 2019-09-12 ENCOUNTER — Inpatient Hospital Stay (HOSPITAL_COMMUNITY): Payer: Medicaid Other

## 2019-09-12 ENCOUNTER — Encounter: Payer: Self-pay | Admitting: Pharmacy Technician

## 2019-09-12 DIAGNOSIS — I361 Nonrheumatic tricuspid (valve) insufficiency: Secondary | ICD-10-CM

## 2019-09-12 DIAGNOSIS — I340 Nonrheumatic mitral (valve) insufficiency: Secondary | ICD-10-CM

## 2019-09-12 LAB — COMPREHENSIVE METABOLIC PANEL
ALT: 45 U/L — ABNORMAL HIGH (ref 0–44)
AST: 29 U/L (ref 15–41)
Albumin: 2.6 g/dL — ABNORMAL LOW (ref 3.5–5.0)
Alkaline Phosphatase: 50 U/L (ref 38–126)
Anion gap: 8 (ref 5–15)
BUN: 15 mg/dL (ref 6–20)
CO2: 20 mmol/L — ABNORMAL LOW (ref 22–32)
Calcium: 8.1 mg/dL — ABNORMAL LOW (ref 8.9–10.3)
Chloride: 111 mmol/L (ref 98–111)
Creatinine, Ser: 0.38 mg/dL — ABNORMAL LOW (ref 0.44–1.00)
GFR calc Af Amer: >60 mL/min (ref >60)
GFR calc non Af Amer: >60 mL/min (ref >60)
Glucose, Bld: 91 mg/dL (ref 70–99)
Potassium: 2.9 mmol/L — ABNORMAL LOW (ref 3.5–5.1)
Sodium: 139 mmol/L (ref 135–145)
Total Bilirubin: 1.8 mg/dL — ABNORMAL HIGH (ref 0.3–1.2)
Total Protein: 5.1 g/dL — ABNORMAL LOW (ref 6.5–8.1)

## 2019-09-12 LAB — ECHOCARDIOGRAM COMPLETE
Area-P 1/2: 3.99 cm2
Height: 68 in
S' Lateral: 3.8 cm
Weight: 3414.48 oz

## 2019-09-12 LAB — CULTURE, BLOOD (ROUTINE X 2)
Special Requests: ADEQUATE
Special Requests: ADEQUATE

## 2019-09-12 LAB — CBC WITH DIFFERENTIAL/PLATELET
Abs Immature Granulocytes: 0 10*3/uL (ref 0.00–0.07)
Basophils Absolute: 0 10*3/uL (ref 0.0–0.1)
Basophils Relative: 0 %
Eosinophils Absolute: 0 10*3/uL (ref 0.0–0.5)
Eosinophils Relative: 0 %
HCT: 24.7 % — ABNORMAL LOW (ref 36.0–46.0)
Hemoglobin: 8.1 g/dL — ABNORMAL LOW (ref 12.0–15.0)
Immature Granulocytes: 0 %
Lymphocytes Relative: 82 %
Lymphs Abs: 0.1 10*3/uL — ABNORMAL LOW (ref 0.7–4.0)
MCH: 31.3 pg (ref 26.0–34.0)
MCHC: 32.8 g/dL (ref 30.0–36.0)
MCV: 95.4 fL (ref 80.0–100.0)
Monocytes Absolute: 0 10*3/uL — ABNORMAL LOW (ref 0.1–1.0)
Monocytes Relative: 6 %
Neutro Abs: 0 10*3/uL — ABNORMAL LOW (ref 1.7–7.7)
Neutrophils Relative %: 12 %
Platelets: 21 10*3/uL — CL (ref 150–400)
RBC: 2.59 MIL/uL — ABNORMAL LOW (ref 3.87–5.11)
RDW: 19.4 % — ABNORMAL HIGH (ref 11.5–15.5)
WBC: 0.2 10*3/uL — CL (ref 4.0–10.5)
nRBC: 0 % (ref 0.0–0.2)

## 2019-09-12 LAB — PLATELET COUNT: Platelets: 27 10*3/uL — CL (ref 150–400)

## 2019-09-12 MED ORDER — FUROSEMIDE 10 MG/ML IJ SOLN
20.0000 mg | Freq: Once | INTRAMUSCULAR | Status: AC
  Administered 2019-09-12: 20 mg via INTRAVENOUS
  Filled 2019-09-12: qty 2

## 2019-09-12 MED ORDER — TENOFOVIR DISOPROXIL FUMARATE 300 MG PO TABS
300.0000 mg | ORAL_TABLET | Freq: Every day | ORAL | Status: DC
  Administered 2019-09-12 – 2019-09-18 (×7): 300 mg via ORAL
  Filled 2019-09-12 (×7): qty 1

## 2019-09-12 MED ORDER — POTASSIUM CHLORIDE 10 MEQ/50ML IV SOLN
10.0000 meq | INTRAVENOUS | Status: AC
  Administered 2019-09-12 (×4): 10 meq via INTRAVENOUS
  Filled 2019-09-12 (×7): qty 50

## 2019-09-12 MED ORDER — CLONAZEPAM 0.5 MG PO TBDP
0.5000 mg | ORAL_TABLET | Freq: Two times a day (BID) | ORAL | Status: DC | PRN
  Administered 2019-09-12 – 2019-09-17 (×9): 0.5 mg via ORAL
  Filled 2019-09-12: qty 4
  Filled 2019-09-12 (×6): qty 1
  Filled 2019-09-12: qty 4
  Filled 2019-09-12: qty 1

## 2019-09-12 MED ORDER — HYDROXYZINE HCL 25 MG PO TABS: 25.0000 mg | ORAL_TABLET | Freq: Four times a day (QID) | ORAL | Status: DC | PRN

## 2019-09-12 MED ORDER — POTASSIUM CHLORIDE CRYS ER 20 MEQ PO TBCR
40.0000 meq | EXTENDED_RELEASE_TABLET | Freq: Once | ORAL | Status: AC
  Administered 2019-09-12: 40 meq via ORAL
  Filled 2019-09-12: qty 2

## 2019-09-12 MED ORDER — POTASSIUM CHLORIDE 20 MEQ/15ML (10%) PO SOLN: 40.0000 meq | Freq: Every day | ORAL | Status: DC

## 2019-09-12 MED ORDER — AMIODARONE IV BOLUS ONLY 150 MG/100ML
150.0000 mg | Freq: Once | INTRAVENOUS | Status: AC
  Administered 2019-09-13: 150 mg via INTRAVENOUS
  Filled 2019-09-12: qty 100

## 2019-09-12 NOTE — Progress Notes (Signed)
NAME:  Kelly Roberson, MRN:  [id number], DOB:  1960-09-09, LOS: 3 ADMISSION DATE:  09/09/2019, CHIEF COMPLAINT:  BRBPR   Brief History   59 year old with AML in remission s/p induction with 7+3 s/p 3 cycles of consolidation HiDAC (most recently 08/28/19) who presents with febrile neutropenia and bright red blood per rectum.   History of present illness   See H+P dated 09/10/2019  Past Medical History  AML  Significant Hospital Events   Admitted 9/7 2u pRBC and 3 units platelets 9/8 2 u plts, Resp distress, volume overload and anxiety, diuresed with improvement   Consults:  Oncology  Procedures:  n/a  Significant Diagnostic Tests:  CT abd/pelvis w/o colitis, no gallbladder/biliary abnormality  Micro Data:  Blood culture 9/6 w/ GNRs  Antimicrobials:  Vanc cefepime flagyl on admission narrowed to cefepime 9/7   Interim history/subjective:  Not sleeping well. No blood in stools per report. Breathing improved with lasix. Home anxiety med added back (clonazepam).   Objective   Blood pressure (!) 142/88, pulse 100, temperature 98.1 F (36.7 C), temperature source Oral, resp. rate (!) 28, height 5\' 8"  (1.727 m), weight 96.8 kg, SpO2 94 %.        Intake/Output Summary (Last 24 hours) at 09/12/2019 0841 Last data filed at 09/12/2019 0358 Gross per 24 hour  Intake 1776.01 ml  Output 1950 ml  Net -173.99 ml   Filed Weights   09/09/19 2051 09/11/19 0500 09/12/19 0500  Weight: 90.7 kg 95 kg 96.8 kg    Examination: General: chronically ill appearing, in NAD Eyes: EOMI, no icterus Lungs: Crackles in bases, NWOB on 4L [place] Cardiovascular: RRR, no murmurs Abdomen: soft, mild tenderness without localization Extremities: edema, warm Neuro: no weakness. CN intact  Resolved Hospital Problem list   n/a  Assessment & Plan:  Febrile Neutropenia and sepsis due to GNR bacteremia: --Cefepime to continue, awaiting sensitivities (enterobacter cloacae)  Acute blood loss anemia: Recent  Hgb 10.5, nadir at 7.6 this admission. Hgb stabilized in 8s. --Platelet goal > 20 --Trend Hgb, goal > 7  Chronic Anemia: Due to chronic disease, chemotherapy.  DOE/Acute hypoxemic respiratory Failure: SpO2 87% on RA in ED. Suspect due to sepsis as well as developing volume overload in setting of fluids and blood produicts. --Favor more diuresis, pending BMP  HTN: Holding home HCTZ, losartan given BRBPR. Bps creeping up. --Low threshold to resume given severe thrombocytopenia --PRN labetalol  Best practice:  Diet: clears Pain/Anxiety/Delirium protocol (if indicated): dilaudid for bone pain VAP protocol (if indicated): n/a DVT prophylaxis: SCDs GI prophylaxis: PPI  Glucose control: n/a Mobility: OOB to chair Code Status: Full Family Communication: patient to update Disposition: ICU, if stable consider stepdown later today  Labs   CBC: Recent Labs  Lab 09/06/19 1223 09/06/19 1223 09/09/19 2115 09/09/19 2115 09/10/19 0109 09/10/19 0649 09/10/19 2108 09/11/19 0732 09/11/19 1700 09/12/19 0338  WBC 0.3*  --  <0.1*  --   --  <0.1* 0.3* 0.3* 0.1*  --   NEUTROABS 0.1*  --  Not Measured  --   --   --  0.0*  --  0.0*  --   HGB 10.5*  --  9.4*   < > 7.6* 5.1* 10.5* 8.8* 8.2*  --   HCT 32.9*   < > 28.4*   < > 22.9* 15.7* 32.0* 25.7* 25.3*  --   MCV 102.8*   < > 99.6  --   --  104.0* 98.5 92.1 97.7  --  PLT 12*  --  <5*   < >  --  9* 10* 11* 10* 27*   < > = values in this interval not displayed.    Basic Metabolic Panel: Recent Labs  Lab 09/06/19 1223 09/09/19 2115 09/10/19 0058 09/10/19 0649 09/11/19 0732  NA 140 135  --  140 141  K 3.8 3.0*  --  4.8 4.9  CL 107 102  --  120* 113*  CO2 26 23  --  14* 18*  GLUCOSE 198* 148*  --  131* 115*  BUN 13 14  --  12 18  CREATININE 0.63 0.56  --  0.35* 0.50  CALCIUM 9.4 9.0  --  5.8* 8.5*  MG  --   --  1.4*  --   --    GFR: Estimated Creatinine Clearance: 91 mL/min (by C-G formula based on SCr of 0.5 mg/dL). Recent Labs    Lab 09/09/19 2114 09/09/19 2115 09/09/19 2307 09/10/19 0109 09/10/19 0649 09/10/19 2108 09/11/19 0732 09/11/19 1700  WBC  --    < >  --   --  <0.1* 0.3* 0.3* 0.1*  LATICACIDVEN 2.1*  --  2.8* 3.3*  --   --   --   --    < > = values in this interval not displayed.    Liver Function Tests: Recent Labs  Lab 09/06/19 1223 09/09/19 2115 09/11/19 0732  AST 20 50* 92*  ALT 18 39 76*  ALKPHOS 103 86 48  BILITOT 1.3* 1.5* 1.6*  PROT 6.0* 6.3* 5.2*  ALBUMIN 3.2* 3.2* 2.7*   No results for input(s): LIPASE, AMYLASE in the last 168 hours. No results for input(s): AMMONIA in the last 168 hours.  ABG    Component Value Date/Time   HCO3 14.3 (L) 09/10/2019 0521   TCO2 27 07/01/2019 0040   ACIDBASEDEF 10.0 (H) 09/10/2019 0521   O2SAT 93.2 09/10/2019 0521     Coagulation Profile: Recent Labs  Lab 09/09/19 2115  INR 1.1    Cardiac Enzymes: No results for input(s): CKTOTAL, CKMB, CKMBINDEX, TROPONINI in the last 168 hours.  HbA1C: Hgb A1c MFr Bld  Date/Time Value Ref Range Status  01/18/2019 10:25 AM 5.4 4.8 - 5.6 % Final    Comment:             Prediabetes: 5.7 - 6.4          Diabetes: >6.4          Glycemic control for adults with diabetes: <7.0     CBG: No results for input(s): GLUCAP in the last 168 hours.  Past Medical History  She,  has a past medical history of Anxiety, Cancer ([place]), Hypertension, and IBS (irritable bowel syndrome).   Surgical History    Past Surgical History:  Procedure Laterality Date  . GASTRIC BYPASS  2013  . NISSEN FUNDOPLICATION  1829   Reversed in 2013  . ORIF ANKLE FRACTURE Left 06/2017  . PLACEMENT OF BREAST IMPLANTS       Social History   reports that she has quit smoking. She has never used smokeless tobacco. She reports previous alcohol use. She reports previous drug use.   Family History   Her family history includes Colon cancer in her maternal aunt; Diabetes in her maternal uncle; Pancreatic cancer in her father  and maternal grandfather.   Allergies Allergies  Allergen Reactions  . Ace Inhibitors     Other reaction(s): Cough  . Lisinopril Cough  . Tylenol [Acetaminophen] Hives  and Itching    Pt. States she only itches from tylenol      Home Medications  Prior to Admission medications   Medication Sig Start Date End Date Taking? Authorizing Provider  acyclovir (ZOVIRAX) 400 MG tablet Take 400 mg by mouth 2 (two) times daily. 09/01/19  Yes [provider]  Cholecalciferol 50 MCG (2000 UT) TABS Take 2,000 Units by mouth daily. 07/20/19 10/27/19 Yes [provider]  clonazePAM (KLONOPIN) 0.5 MG tablet Take 0.5 mg by mouth 2 (two) times daily as needed for anxiety. 09/02/19  Yes [provider]  entecavir (BARACLUDE) 0.5 MG tablet Take 0.5 mg by mouth daily.  06/01/19 05/31/20 Yes [provider]  folic acid (FOLVITE) 1 MG tablet Take 1 mg by mouth daily. 07/19/19 07/18/20 Yes [provider]  gabapentin (NEURONTIN) 300 MG capsule Take 900-1,200 mg by mouth See admin instructions. 900mg  with breakfast and lunch 1200mg  at night 07/19/19  Yes [provider]  hydrochlorothiazide (HYDRODIURIL) 25 MG tablet Take 25 mg by mouth daily.  06/01/19 05/31/20 Yes [provider]  levofloxacin (LEVAQUIN) 500 MG tablet Take 500 mg by mouth daily. 14 day supply 09/02/19  Yes [provider]  loperamide (IMODIUM) 2 MG capsule Take 2 mg by mouth as needed for diarrhea or loose stools.  09/02/19  Yes [provider]  losartan (COZAAR) 25 MG tablet Take 1 tablet (25 mg total) by mouth daily. 05/02/19  Yes Shalhoub, Sherryll Burger, MD  omeprazole (PRILOSEC) 20 MG capsule Take 20 mg by mouth daily.   Yes [provider]  polyethylene glycol (MIRALAX / GLYCOLAX) 17 g packet Take 17 g by mouth daily as needed for moderate constipation or severe constipation. 07/03/19  Yes Sheikh, Omair Latif, DO  traMADol (ULTRAM) 50 MG tablet Take 1 tablet (50 mg total)  by mouth every 8 (eight) hours as needed (mild pain). 07/03/19  Yes Sheikh, Omair Latif, DO  traZODone (DESYREL) 50 MG tablet Take 50 mg by mouth at bedtime as needed for sleep.  05/31/19  Yes [provider]  cyclobenzaprine (FLEXERIL) 5 MG tablet Take 1 tablet (5 mg total) by mouth 3 (three) times daily as needed for muscle spasms. Patient not taking: Reported on 07/29/2019 05/01/19   Vernelle Emerald, MD  LORazepam (ATIVAN) 1 MG tablet Take 1 mg by mouth at bedtime as needed for anxiety or sleep.  Patient not taking: Reported on 09/09/2019 06/18/19   [provider]  metroNIDAZOLE (FLAGYL) 500 MG tablet Take 1 tablet (500 mg total) by mouth every 8 (eight) hours. Patient not taking: Reported on 09/09/2019 08/03/19   Shawna Clamp, MD  Multiple Vitamin (MULTIVITAMIN WITH MINERALS) TABS tablet Take 1 tablet by mouth daily. 07/04/19   Sheikh, Omair Latif, DO  ondansetron (ZOFRAN) 4 MG tablet Take 1 tablet (4 mg total) by mouth every 6 (six) hours as needed for nausea. 07/03/19   Sheikh, Omair Latif, DO  senna-docusate (SENOKOT-S) 8.6-50 MG tablet Take 1 tablet by mouth at bedtime. Patient not taking: Reported on 09/10/2019 07/03/19   Kerney Elbe, DO     Critical care time:      CRITICAL CARE Performed by: Lanier Clam   Total critical care time: 35 minutes  Critical care time was exclusive of separately billable procedures and treating other patients.  Critical care was necessary to treat or prevent imminent or life-threatening deterioration.  Critical care was time spent personally by me on the following activities: development of treatment plan  with patient and/or surrogate as well as nursing, discussions with consultants, evaluation of patient's response to treatment, examination of patient, obtaining history from patient or surrogate, ordering and performing treatments and interventions, ordering and review of laboratory studies, ordering and review of radiographic  studies, pulse oximetry and re-evaluation of patient's condition.

## 2019-09-12 NOTE — Progress Notes (Signed)
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 Found to have pancytopenia and peripheral blasts with intracellular inclusions.  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 4) 05/16/2019: repeat Bmbx shows no residual leukemia 5) 06/17/2019: patient returned for Cycle 1 Consolidation with cytarabine and gemtuzumab at [facility] 6) 06/24/2019: presents to reconnect with [facility] for co-managed care. 7) 07/15/2019: start of Cycle 2 of consolidation therapyat [facility]  8) 07/30/2019: admitted for BRBPR/ Infectious colitis.  9) 08/26/2019: start of Cycle 3 of consolidation therapy at [facility]  10) 09/09/2019: admitted with fever and BRBPR  SUBJECTIVE: Reports that she feels better overall but is still weak.  She is sitting up in the recliner at time my visit.  States that she wants to leave.  She also wants Foley catheter removed.  Denies fevers and chills.  Denies abdominal pain, nausea, vomiting.  Has not noticed any blood in her stool or black appearing stools.   REVIEW OF SYSTEMS:   Constitutional: Denies fevers, chills, reports generalized weakness Ears, nose, mouth, throat, and face: Denies mucositis or sore throat Respiratory: Denies cough, dyspnea or wheezes Cardiovascular: Denies palpitation, chest discomfort Gastrointestinal:  Denies nausea, heartburn or change in bowel habits, denies recurrent blood in her stool Skin: Denies abnormal skin rashes Lymphatics: Denies new lymphadenopathy or easy bruising Neurological:Denies numbness, tingling or new weaknesses Behavioral/Psych: Mood is stable, no new changes  Extremities: No lower extremity edema All other systems were reviewed with  the patient and are negative.  I have reviewed the past medical history, past surgical history, social history and family history with the patient and they are unchanged from previous note.   PHYSICAL EXAMINATION: ECOG PERFORMANCE STATUS: 2 - Symptomatic, <50% confined to bed  Vitals:   09/12/19 0800 09/12/19 1200  BP:    Pulse:    Resp:    Temp: 98.1 F (36.7 C) 97.7 F (36.5 C)  SpO2:     Filed Weights   09/09/19 2051 09/11/19 0500 09/12/19 0500  Weight: 90.7 kg 95 kg 96.8 kg    Intake/Output from previous day: 09/08 0701 - 09/09 0700 In: 7026 [P.O.:840; I.V.:488; Blood:248; IV Piggyback:200] Out: 1950 [Urine:1950]  GENERAL: Chronically ill-appearing female, no distress SKIN: Multiple ecchymoses, particularly to her right arm EYES: No scleral icterus, left eye injected OROPHARYNX:no exudate, no erythema and lips, buccal mucosa, and tongue normal  LUNGS: Rales to bilateral bases HEART: regular rate & rhythm and no murmurs  ABDOMEN:abdomen soft, non-tender and normal bowel sounds NEURO: alert & oriented x 3 with fluent speech, no focal motor/sensory deficits  LABORATORY DATA:  I have reviewed the data as listed CMP Latest Ref Rng & Units 09/12/2019 09/11/2019 09/10/2019  Glucose 70 - 99 mg/dL 91 115(H) 131(H)  BUN 6 - 20 mg/dL 15 18 12   Creatinine 0.44 - 1.00 mg/dL 0.38(L) 0.50 0.35(L)  Sodium 135 - 145 mmol/L 139 141 140  Potassium 3.5 - 5.1 mmol/L 2.9(L) 4.9 4.8  Chloride 98 - 111 mmol/L 111 113(H) 120(H)  CO2 22 - 32 mmol/L 20(L) 18(L) 14(L)  Calcium 8.9 - 10.3 mg/dL 8.1(L) 8.5(L) 5.8(LL)  Total Protein 6.5 - 8.1 g/dL 5.1(L) 5.2(L) -  Total Bilirubin 0.3 - 1.2 mg/dL 1.8(H) 1.6(H) -  Alkaline Phos 38 - 126 U/L 50  48 -  AST 15 - 41 U/L 29 92(H) -  ALT 0 - 44 U/L 45(H) 76(H) -    Lab Results  Component Value Date   WBC 0.2 (LL) 09/12/2019   HGB 8.1 (L) 09/12/2019   HCT 24.7 (L) 09/12/2019   MCV 95.4 09/12/2019   PLT 21 (LL) 09/12/2019   NEUTROABS 0.0 (L)  09/12/2019    DG Chest 1 View  Result Date: 09/11/2019 CLINICAL DATA:  Hypoxemia EXAM: CHEST  1 VIEW COMPARISON:  09/09/2019 FINDINGS: Right upper extremity central venous catheter tip over the SVC. Streaky atelectasis right base. Interim development of patchy airspace disease in the left mid and lower lung. Stable cardiomediastinal silhouette. No pneumothorax. IMPRESSION: Interim development of patchy airspace disease in the left mid and lower lung, possible pneumonia. Electronically Signed   By: Donavan Foil M.D.   On: 09/11/2019 18:57   CT Head Wo Contrast  Result Date: 09/10/2019 CLINICAL DATA:  Headache. Tremor and fever starting yesterday. History of leukemia. Chemotherapy last Wednesday. Nose surgery on Friday. EXAM: CT HEAD WITHOUT CONTRAST TECHNIQUE: Contiguous axial images were obtained from the base of the skull through the vertex without intravenous contrast. COMPARISON:  07/01/2019 FINDINGS: Brain: Mild cerebral atrophy. No ventricular dilatation. Patchy low-attenuation changes in the deep white matter consistent with small vessel ischemic changes and lacunar infarcts. No mass effect or midline shift. No abnormal extra-axial fluid collections. Gray-white matter junctions are distinct. Basal cisterns are not effaced. No acute intracranial hemorrhage. Vascular: Intracranial arterial vascular calcifications are present. Skull: The calvarium appears intact. Sinuses/Orbits: Paranasal sinuses and mastoid air cells are clear. Other: None. IMPRESSION: 1. No acute intracranial abnormalities. 2. Chronic atrophy and small vessel ischemic changes. Electronically Signed   By: Lucienne Capers M.D.   On: 09/10/2019 01:05   CT ABDOMEN PELVIS W CONTRAST  Result Date: 09/09/2019 CLINICAL DATA:  Leukemia.  Recent chemotherapy.  Fever. EXAM: CT ABDOMEN AND PELVIS WITH CONTRAST TECHNIQUE: Multidetector CT imaging of the abdomen and pelvis was performed using the standard protocol following bolus administration  of intravenous contrast. CONTRAST:  128mL OMNIPAQUE IOHEXOL 300 MG/ML  SOLN COMPARISON:  07/29/2019 FINDINGS: Lower chest: Linear scarring or atelectasis in the right lung base. No effusions or acute abnormality. Hepatobiliary: Low-density lesion posteriorly in the liver is stable since prior study measuring 1.7 cm. This is likely hemangioma. No suspicious Vac abnormality. Gallbladder unremarkable. Pancreas: No focal abnormality or ductal dilatation. Spleen: No focal abnormality.  Normal size. Adrenals/Urinary Tract: No adrenal abnormality. No focal renal abnormality. No stones or hydronephrosis. Urinary bladder is unremarkable. Stomach/Bowel: There is fatty proliferation within the right colonic wall, likely related to prior inflammatory process/infection. No evidence for active enteritis/colitis. Prior gastric bypass. Vascular/Lymphatic: No evidence of aneurysm or adenopathy. Scattered aortic calcifications. Reproductive: Uterus and adnexa unremarkable.  No mass. Other: No free fluid or free air. Musculoskeletal: No acute bony abnormality. IMPRESSION: No acute findings in the abdomen or pelvis. Electronically Signed   By: Rolm Baptise M.D.   On: 09/09/2019 22:12   DG Chest Port 1 View  Result Date: 09/09/2019 CLINICAL DATA:  Leukemia patient.  Fever.  Questionable sepsis. EXAM: PORTABLE CHEST 1 VIEW COMPARISON:  07/29/2019 FINDINGS: Cardiac silhouette is normal in size. No mediastinal or hilar masses or evidence of adenopathy. Clear lungs.  No pleural effusion or pneumothorax. Skeletal structures are grossly intact. IMPRESSION: No active disease. Electronically Signed   By: Lajean Manes M.D.   On: 09/09/2019 20:58   [place] EKG SITE RITE  Result Date: 09/11/2019 If Site Rite image not attached, placement could not be confirmed due to current cardiac rhythm.   ASSESSMENT AND PLAN: Kelly Roberson y.o.femalewith medical history significant for favorable risk AML currently undergoing treatment at [facility] who presents for evaluation of fever.  Review the labs, review the records, discussion with the patient her findings are most consistent with a febrile neutropenia due to gram-negative bacteremia.  She has received her G-CSF shot on 09/02/2019 and therefore we are currently waiting for her white blood cell count to rise. She developed bright red blood per rectum this admission.  Hemoglobin had dropped down to 5.1 and has now stabilized in the 8 range without PRBC transfusion since 09/10/2019.  Platelets are slightly improved and are holding steady in the 20,000 range.  Recommend continued supportive care with transfusion to a platelet goal greater than 20 and maintaining a hemoglobin of at least 7.0.    She remains on IV antibiotics and is now afebrile. Overall her clinical course is similar to that of her prior admissions.  Fortunately this should be the last consolidation cycle and afterwards her primary oncologist is considering oral azacitidine as maintenance therapy.   #Febrile Neutropenia --Antibiotics have been narrowed to cefepime.  --Due to gram-negative bacteremia  --received neulasta on 09/02/2019, no clear indication for repeat GCSF --Continue to monitor WBC  #Bright Red Blood Per Rectum --please maintain Hgb goal of 7.0 with increased platelet goal of 20 (in setting of active bleed) --GI consulted during prior admission for similar findings. Agree that patient is not a candidate for endoscopy at this time.  --She is no longer actively bleeding. 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 Elmo Putt at [facility]    LOS: 3 days   Mikey Bussing, [place], AGPCNP-BC, [facility] 09/12/19

## 2019-09-12 NOTE — TOC Progression Note (Signed)
Transition of Care [facility]) - Progression Note    Patient Details  Name: Zoriana Oats MRN: [id number] Date of Birth: April 04, 1960  Transition of [facility] CM/SW Contact  Leeroy Cha, RN Phone Number: 09/12/2019, 8:26 AM  Clinical Narrative:    Wbc-0.1, platlets are at 27-rec'ing one unit PPP.   Expected Discharge Plan: Home/Self Care Barriers to Discharge: Continued Medical Work up  Expected Discharge Plan and Services Expected Discharge Plan: Home/Self Care   Discharge Planning Services: CM Consult   Living arrangements for the past 2 months: Single Family Home                                       Social Determinants of Health (SDOH) Interventions    Readmission Risk Interventions No flowsheet data found.

## 2019-09-12 NOTE — Progress Notes (Signed)
Patient no longer getting Udenyca from [place] based on medicaid coverage. Last DOS covered is 07/22/19.

## 2019-09-12 NOTE — Progress Notes (Signed)
  Echocardiogram 2D Echocardiogram has been performed.  Jennette Dubin 09/12/2019, 2:11 PM

## 2019-09-12 NOTE — Progress Notes (Addendum)
Patient removed pulse ox 2x now. Pt educated the first time upon removal of importance for oxygen monitoring due to patient having had respiratory distress yesterday. Pt states " I don't want any of this stuff on, take it off. I'm doing fine, my oxygen is fine". This RN informed the pt that this is important to see pt's oxygen saturation in case of emergency or desaturating oxygen levels. Pt allowed replacement of pulse ox on finger but has removed again. This RN again informed pt to leave it on. Pt states " I do not want it on". Will monitor pt closely and attempt to place pulse ox back on.

## 2019-09-12 NOTE — Progress Notes (Signed)
[place] Progress Note Patient Name: Kelly Roberson DOB: 06/20/1960 MRN: [id number]   Date of Service  09/12/2019  HPI/Events of Note  Patient with Afib with RVR, need to r/o right upper ext DVT, need to check BMET , Mg + stat.  eICU Interventions  Amiodarone 150 mg iv x 1, BMET, Mg+ check, vascular doppler ultrasound right upper extremity r/o DVT.        Kerry Kass Vangie Henthorn 09/12/2019, 11:40 PM

## 2019-09-12 NOTE — Progress Notes (Signed)
Pt states " there's someone else's clothes on my bed, I don't know why its there". This RN provided reassurance to patient and comfort. Pt resting in chair at this time. Bed alarm on.

## 2019-09-12 NOTE — Progress Notes (Signed)
Notified MD Hunsucker that pt unable to tolerate potassium oral medication.  Pt potassium 2.9. Pt requesting IV route. MD notified and aware. Awaiting orders.

## 2019-09-12 NOTE — Progress Notes (Signed)
[place] Progress Note Patient Name: Siana Panameno DOB: 1960/03/25 MRN: [id number]   Date of Service  09/12/2019  HPI/Events of Note  Patient had 20 meq of iv K+ left to take per protocol but refused it and is asking for po K+ repletion.  eICU Interventions  KCL 40 meq po x 1 now, a.m. BMET.        Kerry Kass Melina Mosteller 09/12/2019, 10:30 PM

## 2019-09-12 NOTE — Progress Notes (Signed)
[place] Progress Note Patient Name: Kelly Roberson DOB: November 12, 1960 MRN: [id number]   Date of Service  09/12/2019  HPI/Events of Note  Notified of anxiety.   Patient takes ativan QHS and clonazepam BID at home for anxiety.  eICU Interventions  Clonazepam 0.5mg  BID ordered.     Intervention Category Minor Interventions: Agitation / anxiety - evaluation and management  Elsie Lincoln 09/12/2019, 6:14 AM

## 2019-09-12 NOTE — Progress Notes (Signed)
Patient is actively hallucinating since start of the shift reported she seeing creeping creatures at her room and ceiling. Patient refused IV potassium, blood draw. At assessment the area around the PICC is swollen and bruised. Called IV team for evaluation. Elink/MD made aware of the situation and will continue to monitor the patient.

## 2019-09-13 ENCOUNTER — Inpatient Hospital Stay: Payer: Medicaid Other

## 2019-09-13 ENCOUNTER — Inpatient Hospital Stay (HOSPITAL_COMMUNITY): Payer: Medicaid Other

## 2019-09-13 DIAGNOSIS — M7989 Other specified soft tissue disorders: Secondary | ICD-10-CM

## 2019-09-13 LAB — CBC WITH DIFFERENTIAL/PLATELET
Abs Immature Granulocytes: 0 10*3/uL (ref 0.00–0.07)
Band Neutrophils: 0 %
Basophils Absolute: 0 10*3/uL (ref 0.0–0.1)
Basophils Relative: 0 %
Eosinophils Absolute: 0 10*3/uL (ref 0.0–0.5)
Eosinophils Relative: 0 %
HCT: 26 % — ABNORMAL LOW (ref 36.0–46.0)
Hemoglobin: 8.9 g/dL — ABNORMAL LOW (ref 12.0–15.0)
Lymphocytes Relative: 0 %
Lymphs Abs: 0 10*3/uL — ABNORMAL LOW (ref 0.7–4.0)
MCH: 32.2 pg (ref 26.0–34.0)
MCHC: 34.2 g/dL (ref 30.0–36.0)
MCV: 94.2 fL (ref 80.0–100.0)
Monocytes Absolute: 0 10*3/uL — ABNORMAL LOW (ref 0.1–1.0)
Monocytes Relative: 0 %
Neutrophils Relative %: 0 %
Platelets: 13 10*3/uL — CL (ref 150–400)
RBC: 2.76 MIL/uL — ABNORMAL LOW (ref 3.87–5.11)
RDW: 18.5 % — ABNORMAL HIGH (ref 11.5–15.5)
WBC: 0.3 10*3/uL — CL (ref 4.0–10.5)
nRBC: 0 % (ref 0.0–0.2)
nRBC: 0 /100 WBC

## 2019-09-13 LAB — BASIC METABOLIC PANEL
Anion gap: 13 (ref 5–15)
BUN: 12 mg/dL (ref 6–20)
CO2: 21 mmol/L — ABNORMAL LOW (ref 22–32)
Calcium: 8.4 mg/dL — ABNORMAL LOW (ref 8.9–10.3)
Chloride: 102 mmol/L (ref 98–111)
Creatinine, Ser: 0.44 mg/dL (ref 0.44–1.00)
GFR calc Af Amer: >60 mL/min (ref >60)
GFR calc non Af Amer: >60 mL/min (ref >60)
Glucose, Bld: 114 mg/dL — ABNORMAL HIGH (ref 70–99)
Potassium: 2.6 mmol/L — CL (ref 3.5–5.1)
Sodium: 136 mmol/L (ref 135–145)

## 2019-09-13 LAB — PREPARE PLATELET PHERESIS
Unit division: 0
Unit division: 0

## 2019-09-13 LAB — COMPREHENSIVE METABOLIC PANEL
ALT: 37 U/L (ref 0–44)
AST: 21 U/L (ref 15–41)
Albumin: 2.7 g/dL — ABNORMAL LOW (ref 3.5–5.0)
Alkaline Phosphatase: 69 U/L (ref 38–126)
Anion gap: 9 (ref 5–15)
BUN: 12 mg/dL (ref 6–20)
CO2: 22 mmol/L (ref 22–32)
Calcium: 8.8 mg/dL — ABNORMAL LOW (ref 8.9–10.3)
Chloride: 104 mmol/L (ref 98–111)
Creatinine, Ser: 0.4 mg/dL — ABNORMAL LOW (ref 0.44–1.00)
GFR calc Af Amer: >60 mL/min (ref >60)
GFR calc non Af Amer: >60 mL/min (ref >60)
Glucose, Bld: 102 mg/dL — ABNORMAL HIGH (ref 70–99)
Potassium: 3.8 mmol/L (ref 3.5–5.1)
Sodium: 135 mmol/L (ref 135–145)
Total Bilirubin: 2.2 mg/dL — ABNORMAL HIGH (ref 0.3–1.2)
Total Protein: 5.4 g/dL — ABNORMAL LOW (ref 6.5–8.1)

## 2019-09-13 LAB — BPAM PLATELET PHERESIS
Blood Product Expiration Date: 202109102359
Blood Product Expiration Date: 202109102359
ISSUE DATE / TIME: 202109081325
ISSUE DATE / TIME: 202109090044
Unit Type and Rh: 6200
Unit Type and Rh: 7300

## 2019-09-13 LAB — MAGNESIUM: Magnesium: 1.3 mg/dL — ABNORMAL LOW (ref 1.7–2.4)

## 2019-09-13 MED ORDER — FUROSEMIDE 10 MG/ML IJ SOLN
20.0000 mg | Freq: Once | INTRAMUSCULAR | Status: AC
  Administered 2019-09-13: 20 mg via INTRAVENOUS
  Filled 2019-09-13: qty 2

## 2019-09-13 MED ORDER — MAGNESIUM SULFATE 4 GM/100ML IV SOLN
4.0000 g | Freq: Once | INTRAVENOUS | Status: AC
  Administered 2019-09-13: 4 g via INTRAVENOUS
  Filled 2019-09-13: qty 100

## 2019-09-13 MED ORDER — SODIUM CHLORIDE 0.9% FLUSH: 10.0000 mL | INTRAVENOUS | Status: DC | PRN

## 2019-09-13 MED ORDER — POTASSIUM CHLORIDE CRYS ER 20 MEQ PO TBCR
40.0000 meq | EXTENDED_RELEASE_TABLET | Freq: Once | ORAL | Status: AC
  Administered 2019-09-13: 40 meq via ORAL
  Filled 2019-09-13: qty 2

## 2019-09-13 MED ORDER — DILTIAZEM HCL 25 MG/5ML IV SOLN
10.0000 mg | Freq: Once | INTRAVENOUS | Status: AC
  Administered 2019-09-13: 10 mg via INTRAVENOUS
  Filled 2019-09-13: qty 5

## 2019-09-13 MED ORDER — POTASSIUM CHLORIDE 10 MEQ/100ML IV SOLN
10.0000 meq | INTRAVENOUS | Status: AC
  Administered 2019-09-13 (×4): 10 meq via INTRAVENOUS
  Filled 2019-09-13 (×4): qty 100

## 2019-09-13 MED ORDER — DEXMEDETOMIDINE HCL IN NACL 400 MCG/100ML IV SOLN
0.2000 ug/kg/h | INTRAVENOUS | Status: DC
  Administered 2019-09-13: 0.4 ug/kg/h via INTRAVENOUS
  Filled 2019-09-13: qty 100

## 2019-09-13 MED ORDER — HEPARIN SOD (PORK) LOCK FLUSH 100 UNIT/ML IV SOLN: 500.0000 [IU] | Freq: Every day | INTRAVENOUS | Status: DC | PRN

## 2019-09-13 MED ORDER — DILTIAZEM HCL-DEXTROSE 125-5 MG/125ML-% IV SOLN (PREMIX)
5.0000 mg/h | INTRAVENOUS | Status: DC
  Administered 2019-09-13: 5 mg/h via INTRAVENOUS
  Filled 2019-09-13: qty 125

## 2019-09-13 NOTE — Progress Notes (Signed)
Pharmacy Antibiotic Note  Kelly Roberson is a 59 y.o. female admitted on 09/09/2019 with febrile neutropenia.  Pt has enterobacter cloacae bacteremia.   Pharmacy has been consulted for cefepime dosing.  Today, 09/13/19  WBC 0.2, ANC 0 (labs on 9/9)  Afebrile  SCr WNL, CrCl > 60 mL/min  This is day #4 of IV antibiotics.   Plan:  Continue cefepime 2 g IV q8h  Follow renal function  Height: 5\' 8"  (172.7 cm) Weight: 94.7 kg (208 lb 12.4 oz) IBW/kg (Calculated) : 63.9  Temp (24hrs), Avg:98.4 F (36.9 C), Min:97.7 F (36.5 C), Max:98.9 F (37.2 C)  Recent Labs  Lab 09/09/19 2114 09/09/19 2115 09/09/19 2115 09/09/19 2307 09/10/19 0109 09/10/19 0649 09/10/19 2108 09/11/19 0732 09/11/19 1700 09/12/19 0934 09/12/19 2340  WBC  --  <0.1*   < >  --   --  <0.1* 0.3* 0.3* 0.1* 0.2*  --   CREATININE  --  0.56  --   --   --  0.35*  --  0.50  --  0.38* 0.44  LATICACIDVEN 2.1*  --   --  2.8* 3.3*  --   --   --   --   --   --    < > = values in this interval not displayed.    Estimated Creatinine Clearance: 90 mL/min (by C-G formula based on SCr of 0.44 mg/dL).    Allergies  Allergen Reactions  . Ace Inhibitors     Other reaction(s): Cough  . Lisinopril Cough  . Tylenol [Acetaminophen] Hives and Itching    Pt. States she only itches from tylenol    Micro: 9/6 BCx: 4/4 BCx: Enterobacter cloacae R only to cefazolin 9/7 UCx: ngf 9/7 MRSA PCR: neg 9/7 COVID-19 Negative 9/8 C. Diff negative  9/8 GI panel: negative  Thank you for allowing pharmacy to be a part of this patient's care.  Lenis Noon, PharmD 09/13/2019 7:24 AM

## 2019-09-13 NOTE — Progress Notes (Signed)
Right upper extremity venous duplex has been completed. Preliminary results can be found in CV Proc through chart review.   09/13/19 8:49 AM Carlos Levering RVT

## 2019-09-13 NOTE — Progress Notes (Signed)
[place] Progress Note Patient Name: Lamica Mccart DOB: [birth year] MRN: [id number]   Date of Service  09/13/2019  HPI/Events of Note  Agitated delirium, Hypokalemia with K+ of 2.6.  eICU Interventions  Low level Precedex infusion ordered, KCL 10 meq iv Q 1 hour x 4 hours, KCL 40 meq po x 1 dose.        Kerry Kass Waylynn Benefiel 09/13/2019, 2:36 AM

## 2019-09-13 NOTE — Progress Notes (Signed)
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 Found to have pancytopenia and peripheral blasts with intracellular inclusions.  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 4) 05/16/2019: repeat Bmbx shows no residual leukemia 5) 06/17/2019: patient returned for Cycle 1 Consolidation with cytarabine and gemtuzumab at [facility] 6) 06/24/2019: presents to reconnect with [facility] for co-managed care. 7) 07/15/2019: start of Cycle 2 of consolidation therapyat [facility]  8) 07/30/2019: admitted for BRBPR/ Infectious colitis.  9) 08/26/2019: start of Cycle 3 of consolidation therapy at [facility]  10) 09/09/2019: admitted with neutropenic fever and BRBPR  Interval History --Afebrile over last 24 hours  --has ICU delirium, though improved from yesterday --foley catheter removed, sitter at bedside  REVIEW OF SYSTEMS:   Constitutional: Denies fevers, chills, reports generalized weakness Ears, nose, mouth, throat, and face: Denies mucositis or sore throat Respiratory: Denies cough, dyspnea or wheezes Cardiovascular: Denies palpitation, chest discomfort Gastrointestinal:  Denies nausea, heartburn or change in bowel habits, denies recurrent blood in her stool Skin: Denies abnormal skin rashes Lymphatics: Denies new lymphadenopathy or easy bruising Neurological:Denies numbness, tingling or new weaknesses Behavioral/Psych: Mood is stable, no new changes  Extremities: No lower extremity edema All other systems were reviewed with the patient and are negative.  I have reviewed the past medical history, past surgical history, social history and family history with the patient and they are unchanged from  previous note.   PHYSICAL EXAMINATION: ECOG PERFORMANCE STATUS: 2 - Symptomatic, <50% confined to bed  Vitals:   09/13/19 1500 09/13/19 1534  BP:  132/66  Pulse: (!) 104 89  Resp: (!) 29 (!) 27  Temp:  98.9 F (37.2 C)  SpO2: 93% 97%   Filed Weights   09/11/19 0500 09/12/19 0500 09/13/19 0500  Weight: 209 lb 7 oz (95 kg) 213 lb 6.5 oz (96.8 kg) 208 lb 12.4 oz (94.7 kg)    Intake/Output from previous day: 09/09 0701 - 09/10 0700 In: 1317.5 [P.O.:480; I.V.:88.3; IV Piggyback:749.2] Out: 3200 [Urine:3200]  GENERAL: Chronically ill-appearing female, no distress SKIN: Multiple ecchymoses, particularly to her right arm EYES: No scleral icterus, left eye injected OROPHARYNX:no exudate, no erythema and lips, buccal mucosa, and tongue normal  LUNGS: Rales to bilateral bases HEART: regular rate & rhythm and no murmurs  ABDOMEN:abdomen soft, non-tender and normal bowel sounds NEURO: alert & oriented x 3 with fluent speech, no focal motor/sensory deficits  LABORATORY DATA:  I have reviewed the data as listed CMP Latest Ref Rng & Units 09/13/2019 09/12/2019 09/12/2019  Glucose 70 - 99 mg/dL 102(H) 114(H) 91  BUN 6 - 20 mg/dL 12 12 15   Creatinine 0.44 - 1.00 mg/dL 0.40(L) 0.44 0.38(L)  Sodium 135 - 145 mmol/L 135 136 139  Potassium 3.5 - 5.1 mmol/L 3.8 2.6(LL) 2.9(L)  Chloride 98 - 111 mmol/L 104 102 111  CO2 22 - 32 mmol/L 22 21(L) 20(L)  Calcium 8.9 - 10.3 mg/dL 8.8(L) 8.4(L) 8.1(L)  Total Protein 6.5 - 8.1 g/dL 5.4(L) - 5.1(L)  Total Bilirubin 0.3 - 1.2 mg/dL 2.2(H) - 1.8(H)  Alkaline Phos 38 - 126 U/L 69 - 50  AST 15 - 41 U/L 21 - 29  ALT 0 - 44 U/L 37 - 45(H)    Lab Results  Component Value Date  WBC 0.3 (LL) 09/13/2019   HGB 8.9 (L) 09/13/2019   HCT 26.0 (L) 09/13/2019   MCV 94.2 09/13/2019   PLT 13 (LL) 09/13/2019   NEUTROABS 0.0 (L) 09/12/2019    DG Chest 1 View  Result Date: 09/11/2019 CLINICAL DATA:  Hypoxemia EXAM: CHEST  1 VIEW COMPARISON:  09/09/2019  FINDINGS: Right upper extremity central venous catheter tip over the SVC. Streaky atelectasis right base. Interim development of patchy airspace disease in the left mid and lower lung. Stable cardiomediastinal silhouette. No pneumothorax. IMPRESSION: Interim development of patchy airspace disease in the left mid and lower lung, possible pneumonia. Electronically Signed   By: Donavan Foil M.D.   On: 09/11/2019 18:57   CT Head Wo Contrast  Result Date: 09/10/2019 CLINICAL DATA:  Headache. Tremor and fever starting yesterday. History of leukemia. Chemotherapy last Wednesday. Nose surgery on Friday. EXAM: CT HEAD WITHOUT CONTRAST TECHNIQUE: Contiguous axial images were obtained from the base of the skull through the vertex without intravenous contrast. COMPARISON:  07/01/2019 FINDINGS: Brain: Mild cerebral atrophy. No ventricular dilatation. Patchy low-attenuation changes in the deep white matter consistent with small vessel ischemic changes and lacunar infarcts. No mass effect or midline shift. No abnormal extra-axial fluid collections. Gray-white matter junctions are distinct. Basal cisterns are not effaced. No acute intracranial hemorrhage. Vascular: Intracranial arterial vascular calcifications are present. Skull: The calvarium appears intact. Sinuses/Orbits: Paranasal sinuses and mastoid air cells are clear. Other: None. IMPRESSION: 1. No acute intracranial abnormalities. 2. Chronic atrophy and small vessel ischemic changes. Electronically Signed   By: Lucienne Capers M.D.   On: 09/10/2019 01:05   CT ABDOMEN PELVIS W CONTRAST  Result Date: 09/09/2019 CLINICAL DATA:  Leukemia.  Recent chemotherapy.  Fever. EXAM: CT ABDOMEN AND PELVIS WITH CONTRAST TECHNIQUE: Multidetector CT imaging of the abdomen and pelvis was performed using the standard protocol following bolus administration of intravenous contrast. CONTRAST:  17mL OMNIPAQUE IOHEXOL 300 MG/ML  SOLN COMPARISON:  07/29/2019 FINDINGS: Lower chest: Linear  scarring or atelectasis in the right lung base. No effusions or acute abnormality. Hepatobiliary: Low-density lesion posteriorly in the liver is stable since prior study measuring 1.7 cm. This is likely hemangioma. No suspicious Vac abnormality. Gallbladder unremarkable. Pancreas: No focal abnormality or ductal dilatation. Spleen: No focal abnormality.  Normal size. Adrenals/Urinary Tract: No adrenal abnormality. No focal renal abnormality. No stones or hydronephrosis. Urinary bladder is unremarkable. Stomach/Bowel: There is fatty proliferation within the right colonic wall, likely related to prior inflammatory process/infection. No evidence for active enteritis/colitis. Prior gastric bypass. Vascular/Lymphatic: No evidence of aneurysm or adenopathy. Scattered aortic calcifications. Reproductive: Uterus and adnexa unremarkable.  No mass. Other: No free fluid or free air. Musculoskeletal: No acute bony abnormality. IMPRESSION: No acute findings in the abdomen or pelvis. Electronically Signed   By: Rolm Baptise M.D.   On: 09/09/2019 22:12   DG Chest Port 1 View  Result Date: 09/09/2019 CLINICAL DATA:  Leukemia patient.  Fever.  Questionable sepsis. EXAM: PORTABLE CHEST 1 VIEW COMPARISON:  07/29/2019 FINDINGS: Cardiac silhouette is normal in size. No mediastinal or hilar masses or evidence of adenopathy. Clear lungs.  No pleural effusion or pneumothorax. Skeletal structures are grossly intact. IMPRESSION: No active disease. Electronically Signed   By: Lajean Manes M.D.   On: 09/09/2019 20:58   ECHOCARDIOGRAM COMPLETE  Result Date: 09/12/2019    ECHOCARDIOGRAM REPORT   Patient Name:   FATMA RUTTEN Date of Exam: 09/12/2019 Medical Rec #:  [id number]      Height:  68.0 in Accession #:    [id number]     Weight:       213.4 lb Date of Birth:  [birth year]     BSA:          2.101 m Patient Age:    35 years       BP:           142/88 mmHg Patient Gender: F              HR:           99 bpm. Exam Location:  Inpatient  Procedure: 2D Echo Indications:    Acute Respiratory Insufficiency R06.89  History:        Patient has no prior history of Echocardiogram examinations.                 Risk Factors:Hypertension.  Sonographer:    Mikki Santee RDCS (AE) Referring Phys: [place]  1. Left ventricular ejection fraction, by estimation, is 50 to 55%. The left ventricle has low normal function. The left ventricle has no regional wall motion abnormalities. The left ventricular internal cavity size was mildly dilated. Left ventricular diastolic parameters are consistent with Grade II diastolic dysfunction (pseudonormalization).  2. Right ventricular systolic function is normal. The right ventricular size is normal. There is moderately elevated pulmonary artery systolic pressure.  3. Left atrial size was moderately dilated.  4. Right atrial size was mildly dilated.  5. The mitral valve is normal in structure. Mild mitral valve regurgitation.  6. The aortic valve is tricuspid. Aortic valve regurgitation is not visualized. No aortic stenosis is present.  7. The inferior vena cava is dilated in size with <50% respiratory variability, suggesting right atrial pressure of 15 mmHg. Comparison(s): No prior Echocardiogram. FINDINGS  Left Ventricle: Left ventricular ejection fraction, by estimation, is 50 to 55%. The left ventricle has low normal function. The left ventricle has no regional wall motion abnormalities. The left ventricular internal cavity size was mildly dilated. There is no left ventricular hypertrophy. Left ventricular diastolic parameters are consistent with Grade II diastolic dysfunction (pseudonormalization). Right Ventricle: The right ventricular size is normal. No increase in right ventricular wall thickness. Right ventricular systolic function is normal. There is moderately elevated pulmonary artery systolic pressure. The tricuspid regurgitant velocity is 2.91 m/s, and with an assumed right  atrial pressure of 15 mmHg, the estimated right ventricular systolic pressure is 22.9 mmHg. Left Atrium: Left atrial size was moderately dilated. Right Atrium: Right atrial size was mildly dilated. Pericardium: There is no evidence of pericardial effusion. Mitral Valve: The mitral valve is normal in structure. Mild to moderate mitral annular calcification. Mild mitral valve regurgitation. Tricuspid Valve: The tricuspid valve is normal in structure. Tricuspid valve regurgitation is mild . No evidence of tricuspid stenosis. Aortic Valve: The aortic valve is tricuspid. Aortic valve regurgitation is not visualized. No aortic stenosis is present. Pulmonic Valve: The pulmonic valve was not well visualized. Pulmonic valve regurgitation is trivial. No evidence of pulmonic stenosis. Aorta: The aortic root, ascending aorta and aortic arch are all structurally normal, with no evidence of dilitation or obstruction. Venous: The inferior vena cava is dilated in size with less than 50% respiratory variability, suggesting right atrial pressure of 15 mmHg. IAS/Shunts: The atrial septum is grossly normal. Additional Comments: A venous catheter is visualized in the right atrium.  LEFT VENTRICLE PLAX 2D LVIDd:         5.10 cm  Diastology LVIDs:  3.80 cm  LV e' medial:    6.89 cm/s LV PW:         1.10 cm  LV E/e' medial:  11.7 LV IVS:        1.00 cm  LV e' lateral:   6.24 cm/s LVOT diam:     2.20 cm  LV E/e' lateral: 12.9 LV SV:         68 LV SV Index:   33 LVOT Area:     3.80 cm  RIGHT VENTRICLE RV S prime:     14.30 cm/s TAPSE (M-mode): 2.0 cm LEFT ATRIUM             Index       RIGHT ATRIUM           Index LA diam:        3.70 cm 1.76 cm/m  RA Area:     19.30 cm LA Vol (A2C):   93.7 ml 44.60 ml/m RA Volume:   57.00 ml  27.13 ml/m LA Vol (A4C):   52.4 ml 24.94 ml/m LA Biplane Vol: 72.7 ml 34.60 ml/m  AORTIC VALVE LVOT Vmax:   88.30 cm/s LVOT Vmean:  67.000 cm/s LVOT VTI:    0.180 m  AORTA Ao Root diam: 3.10 cm MITRAL  VALVE               TRICUSPID VALVE MV Area (PHT): 3.99 cm    TR Peak grad:   33.9 mmHg MV Decel Time: 190 msec    TR Vmax:        291.00 cm/s MV E velocity: 80.60 cm/s MV A velocity: 72.00 cm/s  SHUNTS MV E/A ratio:  1.12        Systemic VTI:  0.18 m                            Systemic Diam: 2.20 cm Buford Dresser MD Electronically signed by Buford Dresser MD Signature Date/Time: 09/12/2019/6:19:25 PM    Final    VAS [place] UPPER EXTREMITY VENOUS DUPLEX  Result Date: 09/13/2019 UPPER VENOUS STUDY  Indications: Swelling Risk Factors: None identified. Limitations: Poor ultrasound/tissue interface, bandages and line. Comparison Study: No prior studies. Performing Technologist: Oliver Hum RVT  Examination Guidelines: A complete evaluation includes B-mode imaging, spectral Doppler, color Doppler, and power Doppler as needed of all accessible portions of each vessel. Bilateral testing is considered an integral part of a complete examination. Limited examinations for reoccurring indications may be performed as noted.  Right Findings: +----------+------------+---------+-----------+----------+-------+ RIGHT     CompressiblePhasicitySpontaneousPropertiesSummary +----------+------------+---------+-----------+----------+-------+ IJV           Full       Yes       Yes                      +----------+------------+---------+-----------+----------+-------+ Subclavian    Full       Yes       Yes                      +----------+------------+---------+-----------+----------+-------+ Axillary      Full       Yes       Yes                      +----------+------------+---------+-----------+----------+-------+ Brachial      Full       Yes       Yes                      +----------+------------+---------+-----------+----------+-------+  Radial        Full                                          +----------+------------+---------+-----------+----------+-------+ Ulnar          Full                                          +----------+------------+---------+-----------+----------+-------+ Cephalic      Full                                          +----------+------------+---------+-----------+----------+-------+ Basilic       Full                                          +----------+------------+---------+-----------+----------+-------+  Left Findings: +----------+------------+---------+-----------+----------+-------+ LEFT      CompressiblePhasicitySpontaneousPropertiesSummary +----------+------------+---------+-----------+----------+-------+ Subclavian    Full       Yes       Yes                      +----------+------------+---------+-----------+----------+-------+  Summary:  Right: No evidence of deep vein thrombosis in the upper extremity. No evidence of superficial vein thrombosis in the upper extremity.  Left: No evidence of thrombosis in the subclavian.  *See table(s) above for measurements and observations.  Diagnosing physician: Servando Snare MD Electronically signed by Servando Snare MD on 09/13/2019 at 9:17:22 AM.    Final    [place] EKG SITE RITE  Result Date: 09/11/2019 If Site Rite image not attached, placement could not be confirmed due to current cardiac rhythm.   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  Review the labs, review the records, discussion with the patient her findings are most consistent with a febrile neutropenia due to gram-negative bacteremia.  She has received her G-CSF shot on 09/02/2019 and therefore we are currently waiting for her white blood cell count to rise. She developed bright red blood per rectum this admission.  Hemoglobin had dropped down to 5.1 and has now stabilized in the 8 range without PRBC transfusion since 09/10/2019.  Platelets are slightly  improved and are holding steady in the 20,000 range.  Recommend continued supportive care with transfusion to a platelet goal greater than 20 and maintaining a hemoglobin of at least 7.0.    She remains on IV antibiotics and is now afebrile. Overall her clinical course is similar to that of her prior admissions.  Fortunately this should be the last consolidation cycle and afterwards her primary oncologist is considering oral azacitidine as maintenance therapy.   #Febrile Neutropenia --Antibiotics have been narrowed to cefepime due to gram-negative bacteremia  --received neulasta on 09/02/2019, no indication for repeat GCSF --WBC 0.3 today, increasing.  --continue to monitor daily CBC with differential.   #Bright Red Blood Per Rectum --please maintain Hgb goal of 7.0 with increased platelet goal of 20 (in setting of active bleed) --GI consulted during prior admission for similar findings. Agree that  patient is not a candidate for endoscopy at this time.  --She is no longer actively bleeding. 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 Elmo Putt at [facility]   .Ledell Peoples, MD Department of Hematology/Oncology [place] at [facility] Phone: [phone] Pager: [phone] Email: [email or web address]

## 2019-09-13 NOTE — Progress Notes (Signed)
CRITICAL VALUE ALERT  Critical Value: Potassium 2.6  Date & Time Notied: 09/13/19; 1:55AM  Provider Notified: [facility]/CCM MD  Orders Received/Actions taken: MD ordered for 31meq KCl tabs and 4x 32meq IV

## 2019-09-13 NOTE — Progress Notes (Signed)
NAME:  Kelly Roberson, MRN:  [id number], DOB:  1960-10-10, LOS: 4 ADMISSION DATE:  09/09/2019, CHIEF COMPLAINT:  BRBPR   Brief History   59 year old with AML in remission s/p induction with 7+3 s/p 3 cycles of consolidation HiDAC (most recently 08/28/19) who presents with febrile neutropenia and bright red blood per rectum.   History of present illness   See H+P dated 09/10/2019  Past Medical History  AML  Significant Hospital Events   Admitted 9/7 2u pRBC and 3 units platelets 9/8 2 u plts, Resp distress, volume overload and anxiety, diuresed with improvement 9/9 PM Afib w/ RVR and agitated dlirium converted to NSR with dilt   Consults:  Oncology  Procedures:  n/a  Significant Diagnostic Tests:  CT abd/pelvis w/o colitis, no gallbladder/biliary abnormality  Micro Data:  Blood culture 9/6 w/ GNRs  Antimicrobials:  Vanc cefepime flagyl on admission narrowed to cefepime 9/7   Interim history/subjective:  Agitated overnight briefly on precedex. Paranoid. Went into AFIB w/RVR and was on dilt drip.  All drips off this AM. Very paranoid. Clearly delirious endorsing multiple visual hallucinations.   Objective   Blood pressure (!) 124/44, pulse 83, temperature 98.4 F (36.9 C), temperature source Oral, resp. rate (!) 25, height 5\' 8"  (1.727 m), weight 94.7 kg, SpO2 97 %.        Intake/Output Summary (Last 24 hours) at 09/13/2019 0924 Last data filed at 09/13/2019 0825 Gross per 24 hour  Intake 1317.47 ml  Output 3425 ml  Net -2107.53 ml   Filed Weights   09/11/19 0500 09/12/19 0500 09/13/19 0500  Weight: 95 kg 96.8 kg 94.7 kg    Examination: General: chronically ill appearing, in NAD Eyes: EOMI, no icterus Lungs: Crackles in bases, NWOB RA Cardiovascular: RRR, no murmurs Abdomen: soft, mild tenderness without localization Extremities: edema, warm Neuro: no weakness. CN intact  Resolved Hospital Problem list   n/a  Assessment & Plan:  Febrile Neutropenia and  sepsis due to GNR bacteremia: --Cefepime to continue, plan 14 days of antibiotics (enterobacter cloacae sensitive to cipro so oral option available at time of d/c)  Acute blood loss anemia: Recent Hgb 10.5, nadir at 7.6 this admission. Hgb stabilized in 8s. No further bleeding visualized. --Platelet goal > 10 now no longer evidence of bleeding --Trend Hgb, goal > 7  Chronic Anemia: Due to chronic disease, chemotherapy.  DOE/Acute hypoxemic respiratory Failure: SpO2 87% on RA in ED. Suspect due to sepsis as well as developing volume overload in setting of fluids and blood products. Now on RA from 4-6L [place] with diuresis. TTE with elevated RA pressures, diastolic dysfunction.  --Favor more diuresis, lasix 20 mg iv x 1 ordered --Aggressive K and mag repletion  HTN: Holding home HCTZ, losartan given BRBPR. Bps creeping up. --Low threshold to resume given severe thrombocytopenia --PRN labetalol  Afib w/ RVR: Overnight 9/9 to 9/10, likely related to electrolyte disturbances and volume overload. Converted to NSR on dilt drip now off.  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   Best practice:  Diet: clears Pain/Anxiety/Delirium protocol (if indicated): dilaudid for bone pain VAP protocol (if indicated): n/a DVT prophylaxis: SCDs GI prophylaxis: PPI  Glucose control: n/a Mobility: OOB to chair Code Status: Full Family Communication: as able Disposition: Transfer to floor  Labs   CBC: Recent Labs  Lab 09/06/19 1223 09/06/19 1223 09/09/19 2115 09/10/19 0109 09/10/19 2683 09/10/19 4196 09/10/19 2108 09/11/19 0732 09/11/19 1700 09/12/19  1638 09/12/19 0934  WBC 0.3*  --  <0.1*   < > <0.1*  --  0.3* 0.3* 0.1*  --  0.2*  NEUTROABS 0.1*  --  Not Measured  --   --   --  0.0*  --  0.0*  --  0.0*  HGB 10.5*  --  9.4*   < > 5.1*  --  10.5* 8.8* 8.2*  --  8.1*  HCT 32.9*   < > 28.4*   < > 15.7*  --  32.0* 25.7* 25.3*  --  24.7*  MCV  102.8*   < > 99.6   < > 104.0*  --  98.5 92.1 97.7  --  95.4  PLT 12*  --  <5*   < > 9*   < > 10* 11* 10* 27* 21*   < > = values in this interval not displayed.    Basic Metabolic Panel: Recent Labs  Lab 09/09/19 2115 09/10/19 0058 09/10/19 0649 09/11/19 0732 09/12/19 0934 09/12/19 2340 09/13/19 0831  NA   < >  --  140 141 139 136 135  K   < >  --  4.8 4.9 2.9* 2.6* 3.8  CL   < >  --  120* 113* 111 102 104  CO2   < >  --  14* 18* 20* 21* 22  GLUCOSE   < >  --  131* 115* 91 114* 102*  BUN   < >  --  12 18 15 12 12   CREATININE   < >  --  0.35* 0.50 0.38* 0.44 0.40*  CALCIUM   < >  --  5.8* 8.5* 8.1* 8.4* 8.8*  MG  --  1.4*  --   --   --  1.3*  --    < > = values in this interval not displayed.   GFR: Estimated Creatinine Clearance: 90 mL/min (A) (by C-G formula based on SCr of 0.4 mg/dL (L)). Recent Labs  Lab 09/09/19 2114 09/09/19 2115 09/09/19 2307 09/10/19 0109 09/10/19 0649 09/10/19 2108 09/11/19 0732 09/11/19 1700 09/12/19 0934  WBC  --    < >  --   --    < > 0.3* 0.3* 0.1* 0.2*  LATICACIDVEN 2.1*  --  2.8* 3.3*  --   --   --   --   --    < > = values in this interval not displayed.    Liver Function Tests: Recent Labs  Lab 09/06/19 1223 09/09/19 2115 09/11/19 0732 09/12/19 0934 09/13/19 0831  AST 20 50* 92* 29 21  ALT 18 39 76* 45* 37  ALKPHOS 103 86 48 50 69  BILITOT 1.3* 1.5* 1.6* 1.8* 2.2*  PROT 6.0* 6.3* 5.2* 5.1* 5.4*  ALBUMIN 3.2* 3.2* 2.7* 2.6* 2.7*   No results for input(s): LIPASE, AMYLASE in the last 168 hours. No results for input(s): AMMONIA in the last 168 hours.  ABG    Component Value Date/Time   HCO3 14.3 (L) 09/10/2019 0521   TCO2 27 07/01/2019 0040   ACIDBASEDEF 10.0 (H) 09/10/2019 0521   O2SAT 93.2 09/10/2019 0521     Coagulation Profile: Recent Labs  Lab 09/09/19 2115  INR 1.1    Cardiac Enzymes: No results for input(s): CKTOTAL, CKMB, CKMBINDEX, TROPONINI in the last 168 hours.  HbA1C: Hgb A1c MFr Bld    Date/Time Value Ref Range Status  01/18/2019 10:25 AM 5.4 4.8 - 5.6 % Final    Comment:  Prediabetes: 5.7 - 6.4          Diabetes: >6.4          Glycemic control for adults with diabetes: <7.0     CBG: No results for input(s): GLUCAP in the last 168 hours.  Past Medical History  She,  has a past medical history of Anxiety, Cancer ([place]), Hypertension, and IBS (irritable bowel syndrome).   Surgical History    Past Surgical History:  Procedure Laterality Date  . GASTRIC BYPASS  2013  . NISSEN FUNDOPLICATION  5361   Reversed in 2013  . ORIF ANKLE FRACTURE Left 06/2017  . PLACEMENT OF BREAST IMPLANTS       Social History   reports that she has quit smoking. She has never used smokeless tobacco. She reports previous alcohol use. She reports previous drug use.   Family History   Her family history includes Colon cancer in her maternal aunt; Diabetes in her maternal uncle; Pancreatic cancer in her father and maternal grandfather.   Allergies Allergies  Allergen Reactions  . Ace Inhibitors     Other reaction(s): Cough  . Lisinopril Cough  . Tylenol [Acetaminophen] Hives and Itching    Pt. States she only itches from tylenol      Home Medications  Prior to Admission medications   Medication Sig Start Date End Date Taking? Authorizing Provider  acyclovir (ZOVIRAX) 400 MG tablet Take 400 mg by mouth 2 (two) times daily. 09/01/19  Yes [provider]  Cholecalciferol 50 MCG (2000 UT) TABS Take 2,000 Units by mouth daily. 07/20/19 10/27/19 Yes [provider]  clonazePAM (KLONOPIN) 0.5 MG tablet Take 0.5 mg by mouth 2 (two) times daily as needed for anxiety. 09/02/19  Yes [provider]  entecavir (BARACLUDE) 0.5 MG tablet Take 0.5 mg by mouth daily.  06/01/19 05/31/20 Yes [provider]  folic acid (FOLVITE) 1 MG tablet Take 1 mg by mouth daily. 07/19/19 07/18/20 Yes [provider]  gabapentin (NEURONTIN) 300 MG capsule Take  900-1,200 mg by mouth See admin instructions. 900mg  with breakfast and lunch 1200mg  at night 07/19/19  Yes [provider]  hydrochlorothiazide (HYDRODIURIL) 25 MG tablet Take 25 mg by mouth daily.  06/01/19 05/31/20 Yes [provider]  levofloxacin (LEVAQUIN) 500 MG tablet Take 500 mg by mouth daily. 14 day supply 09/02/19  Yes [provider]  loperamide (IMODIUM) 2 MG capsule Take 2 mg by mouth as needed for diarrhea or loose stools.  09/02/19  Yes [provider]  losartan (COZAAR) 25 MG tablet Take 1 tablet (25 mg total) by mouth daily. 05/02/19  Yes Shalhoub, Sherryll Burger, MD  omeprazole (PRILOSEC) 20 MG capsule Take 20 mg by mouth daily.   Yes [provider]  polyethylene glycol (MIRALAX / GLYCOLAX) 17 g packet Take 17 g by mouth daily as needed for moderate constipation or severe constipation. 07/03/19  Yes Sheikh, Omair Latif, DO  traMADol (ULTRAM) 50 MG tablet Take 1 tablet (50 mg total) by mouth every 8 (eight) hours as needed (mild pain). 07/03/19  Yes Sheikh, Omair Latif, DO  traZODone (DESYREL) 50 MG tablet Take 50 mg by mouth at bedtime as needed for sleep.  05/31/19  Yes [provider]  cyclobenzaprine (FLEXERIL) 5 MG tablet Take 1 tablet (5 mg total) by mouth 3 (three) times daily as needed for muscle spasms. Patient not taking: Reported on 07/29/2019 05/01/19   Vernelle Emerald, MD  LORazepam (ATIVAN) 1 MG tablet Take 1 mg  by mouth at bedtime as needed for anxiety or sleep.  Patient not taking: Reported on 09/09/2019 06/18/19   [provider]  metroNIDAZOLE (FLAGYL) 500 MG tablet Take 1 tablet (500 mg total) by mouth every 8 (eight) hours. Patient not taking: Reported on 09/09/2019 08/03/19   Shawna Clamp, MD  Multiple Vitamin (MULTIVITAMIN WITH MINERALS) TABS tablet Take 1 tablet by mouth daily. 07/04/19   Sheikh, Omair Latif, DO  ondansetron (ZOFRAN) 4 MG tablet Take 1 tablet (4 mg total) by mouth every 6 (six) hours as needed for  nausea. 07/03/19   Sheikh, Omair Latif, DO  senna-docusate (SENOKOT-S) 8.6-50 MG tablet Take 1 tablet by mouth at bedtime. Patient not taking: Reported on 09/10/2019 07/03/19   Kerney Elbe, DO     Critical care time: n/a

## 2019-09-13 NOTE — Progress Notes (Signed)
At 23:00 patient HR at 170-190's and rhythm at EKG= irregular, atrial fib RVM. MD aware and ordered Amiodarone IV bolus, Cardizem 10mg  bolus and started Cardizem drip. The patient also hallucinating more, said she sees creeping insects & spiders, trying to get out of bed, refusing care and complaining that she have not slept for days. MD made aware and ordered Precedex. In AM SBP= 90's and rhythm converted to SR. MD ordered to stop Precedex and continue the Cardizem drip until further evaluation in AM rounds and given PO.

## 2019-09-14 LAB — CBC WITH DIFFERENTIAL/PLATELET
Abs Immature Granulocytes: 0 10*3/uL (ref 0.00–0.07)
Basophils Absolute: 0 10*3/uL (ref 0.0–0.1)
Basophils Relative: 0 %
Eosinophils Absolute: 0 10*3/uL (ref 0.0–0.5)
Eosinophils Relative: 0 %
HCT: 24.4 % — ABNORMAL LOW (ref 36.0–46.0)
Hemoglobin: 8 g/dL — ABNORMAL LOW (ref 12.0–15.0)
Immature Granulocytes: 0 %
Lymphocytes Relative: 36 %
Lymphs Abs: 0.2 10*3/uL — ABNORMAL LOW (ref 0.7–4.0)
MCH: 31.4 pg (ref 26.0–34.0)
MCHC: 32.8 g/dL (ref 30.0–36.0)
MCV: 95.7 fL (ref 80.0–100.0)
Monocytes Absolute: 0.1 10*3/uL (ref 0.1–1.0)
Monocytes Relative: 20 %
Neutro Abs: 0.3 10*3/uL — ABNORMAL LOW (ref 1.7–7.7)
Neutrophils Relative %: 44 %
Platelets: 8 10*3/uL — CL (ref 150–400)
RBC: 2.55 MIL/uL — ABNORMAL LOW (ref 3.87–5.11)
RDW: 18 % — ABNORMAL HIGH (ref 11.5–15.5)
WBC: 0.6 10*3/uL — CL (ref 4.0–10.5)
nRBC: 0 % (ref 0.0–0.2)

## 2019-09-14 LAB — TYPE AND SCREEN
ABO/RH(D): A POS
Antibody Screen: NEGATIVE

## 2019-09-14 LAB — COMPREHENSIVE METABOLIC PANEL
ALT: 25 U/L (ref 0–44)
AST: 16 U/L (ref 15–41)
Albumin: 2.6 g/dL — ABNORMAL LOW (ref 3.5–5.0)
Alkaline Phosphatase: 73 U/L (ref 38–126)
Anion gap: 9 (ref 5–15)
BUN: 9 mg/dL (ref 6–20)
CO2: 26 mmol/L (ref 22–32)
Calcium: 8.3 mg/dL — ABNORMAL LOW (ref 8.9–10.3)
Chloride: 103 mmol/L (ref 98–111)
Creatinine, Ser: 0.37 mg/dL — ABNORMAL LOW (ref 0.44–1.00)
GFR calc Af Amer: >60 mL/min (ref >60)
GFR calc non Af Amer: >60 mL/min (ref >60)
Glucose, Bld: 102 mg/dL — ABNORMAL HIGH (ref 70–99)
Potassium: 3.2 mmol/L — ABNORMAL LOW (ref 3.5–5.1)
Sodium: 138 mmol/L (ref 135–145)
Total Bilirubin: 1.5 mg/dL — ABNORMAL HIGH (ref 0.3–1.2)
Total Protein: 5.3 g/dL — ABNORMAL LOW (ref 6.5–8.1)

## 2019-09-14 MED ORDER — SODIUM CHLORIDE 0.9% IV SOLUTION: Freq: Once | INTRAVENOUS | Status: AC

## 2019-09-14 MED ORDER — DILTIAZEM HCL ER COATED BEADS 120 MG PO CP24
120.0000 mg | ORAL_CAPSULE | Freq: Every day | ORAL | Status: DC
  Administered 2019-09-14 – 2019-09-18 (×5): 120 mg via ORAL
  Filled 2019-09-14 (×5): qty 1

## 2019-09-14 MED ORDER — FUROSEMIDE 10 MG/ML IJ SOLN
20.0000 mg | Freq: Once | INTRAMUSCULAR | Status: AC
  Administered 2019-09-14: 20 mg via INTRAVENOUS
  Filled 2019-09-14: qty 2

## 2019-09-14 MED ORDER — POTASSIUM CHLORIDE CRYS ER 20 MEQ PO TBCR
40.0000 meq | EXTENDED_RELEASE_TABLET | Freq: Every day | ORAL | Status: DC
  Administered 2019-09-14: 40 meq via ORAL
  Filled 2019-09-14: qty 2

## 2019-09-14 MED ORDER — DIPHENHYDRAMINE HCL 25 MG PO CAPS
25.0000 mg | ORAL_CAPSULE | Freq: Once | ORAL | Status: AC
  Administered 2019-09-14: 25 mg via ORAL
  Filled 2019-09-14: qty 1

## 2019-09-14 NOTE — Progress Notes (Signed)
   09/14/19 0040  Assess: MEWS Score  Temp 97.8 F (36.6 C)  BP 140/86  Pulse Rate (!) 104  SpO2 98 %  O2 Device Nasal Cannula  Assess: MEWS Score  MEWS Temp 0  MEWS Systolic 0  MEWS Pulse 1  MEWS RR 1  MEWS LOC 0  MEWS Score 2  MEWS Score Color Yellow  Assess: if the MEWS score is Yellow or Red  Were vital signs taken at a resting state? No  Focused Assessment No change from prior assessment  Early Detection of Sepsis Score *See Row Information* High  MEWS guidelines implemented *See Row Information* No, vital signs rechecked  Treat  MEWS Interventions Other (Comment) (Pt agitated vital will be recheck at rest)  Pain Scale 0-10  Pain Score 0  Document  Progress note created (see row info) Yes  Pt runs tachy intermittently. Not a new change. Patient stable on telemetry sinus rhythm. Will recheck vital sign at resting.

## 2019-09-14 NOTE — Progress Notes (Signed)
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 Mauri Pole received G-CSF-09/02/2019  Previous admission [facility] 07/29/2019 bloody diarrhea abdominal pain-ANC 300 platelets less than 5 hemoglobin 6-blood cultures = coagulase negative staph CT abdomen pelvis = mild colitis-evaluated by GI and at that time received 4 units PRBC, 2 units platelet she was discharged 08/03/2019 to complete Cipro and Flagyl  She was admitted to [facility] 08/28/2019 for consolidation cycle 3 receiving cytarabine through PICC line and was discharged on acyclovir 400 twice daily and levofloxacin 500 for 14 days total  Admitted by critical care 9/6 febrile neutropenia T-max 100.9 hemoglobin 9.4 platelet less than 5 Developed GI bleed-hemoglobin dropped to 5-given 3 total units of platelets, transfused 2 units of blood Developed A. fib with RVR 9/9 placed on diltiazem and converted to sinus She was on Precedex while in the ICU secondary to ICU delirium and she was having visual hallucinations as of 9/10   Assessment & Plan:   Principal Problem:   Severe sepsis ([place]) Active Problems:   Essential hypertension   AML (acute myeloblastic leukemia) ([place])   Hypokalemia   Pancytopenia ([place])   Sepsis ([place])    1. Severe thrombocytopenia and anemia secondary to blood loss superimposed on effective chemotherapy a. 2 units PRBC/3 units platelets 9/7, 2 units platelets 9/8 b. Transfuse 2  platelets 9/11 for severe thrombocytopenia c. Received Neulasta 8/30 at oncology office and expect counts to improve-threshold for platelet transfusion-below 20 d. No overt bleeding at this time 2. ?  ICU delirium 9/9, 9/10 a. Was on Precedex because she was seeing bugs as documented by  oncologist and critical care physician b. Completely back to normal at this time therefore discontinue safety sitter 3. Febrile neutropenia with gram-negative bacteremia Enterobacter cloacae 4. ?  Pneumonia in addition a. Continue empiric cefepime at this time b. 1 view x-ray 9/8?  Pneumonia versus volume overload we will repeat two-view tomorrow to confirm or refute this c. Re evaluate need for antibiotics in a.m. 5. Paroxysmal atrial fibrillation 9/9-not a candidate currently for any type of anticoagulation given severe thrombocytopenia/anemia and this has not recurred 6. EF 50-55% with moderately elevated PASP a. Loaded with amiodarone by critical care and placed on Cardizem gtt. 9/9-not on any medications now b. start Cardizem 120 CD 9/11 for tachycardia (she is in sinus) c. Does not have volume overload although she is +1 L of fluid overload hold Lasix at this time 7. Acute respiratory failure earlier in admission secondary to volume overload-echocardiogram elevated right arterial pressures with diastolic dysfunction a. Received Lasix earlier in admission b. Monitor respirations however now on only 2 L of oxygen at baseline c. Chest x-ray as above 8.  acute blood loss anemia?  Bleed/undefined cause as no scope can be performed safely with severe thrombocytopenia 9. Possibility of?  Lower GI bleed without colitis a. No further reports of bleeding and unsafe to scope--- if recurrent bleed will need CTA b. Will need to call GI for further bleeding occurs as she is full code 10. AML on cycle 3 of therapy followed at [facility] last received Neulasta 09/02/2019 a. appreciate oncology eventual input  11. DVT ruled out by ultrasound right upper extremity 9/10 12. Severe hypokalemia earlier in admission a. Replacing with oral potassium 40 b.  Check a.m. magnesium and labs 13. Prior hepatitis B a. Continue tenofovir   DVT prophylaxis: SCD Code Status: Full Family Communication: None at  bedside Disposition:   Status is: Inpatient  Remains inpatient appropriate because:Hemodynamically unstable and IV treatments appropriate due to intensity of illness or inability to take PO   Dispo: The patient is from: Home              Anticipated d/c is to: Home              Anticipated d/c date is: 3 days              Patient currently is not medically stable to d/c.       Consultants:   Oncology  Procedures: Multiple  Antimicrobials: Cefepime currently   Subjective: Doing fair Coherent No chest pain No fever No shortness of breath Not confused tolerating diet  Objective: Vitals:   09/14/19 0040 09/14/19 0225 09/14/19 0449 09/14/19 0652  BP: 140/86 (!) 140/97 (!) 160/86   Pulse: (!) 104 97 99   Resp:      Temp: 97.8 F (36.6 C) 98.4 F (36.9 C) 98.2 F (36.8 C)   TempSrc: Oral Oral Oral   SpO2: 98% 96% 91%   Weight:    93.4 kg  Height:        Intake/Output Summary (Last 24 hours) at 09/14/2019 0753 Last data filed at 09/13/2019 1830 Gross per 24 hour  Intake 1138.14 ml  Output 625 ml  Net 513.14 ml   Filed Weights   09/12/19 0500 09/13/19 0500 09/14/19 0652  Weight: 96.8 kg 94.7 kg 93.4 kg    Examination:  General exam: Awake coherent pleasant does have area on the tongue that seem discoordinated/bitten she also has an area on her nose that has a scab on the right side No icterus no pallor neck soft supple moderate dentition Respiratory system: Clinically clear no wheeze no rhonchi no adventitious sounds Cardiovascular system: S1-S2 tachycardic sinus on monitors is in sinus rhythm low 100s Gastrointestinal system: Soft nontender no rebound no guarding no organomegaly. Central nervous system: Coherent power 5/5 bilaterally sensory grossly intact Extremities: No lower extremity edema ROM to most joints is intact Skin: As above Psychiatry: Euthymic pleasant  Data Reviewed: I have personally reviewed following labs and imaging studies Potassium  down from 3.8-3.2 BUNs/creatinine 12/0.4-->9/0.3 AST/ALT 16/25 WBC 0.6, hemoglobin down from 8.9-8.0, platelet 13-->8 ANC 300  Radiology Studies: ECHOCARDIOGRAM COMPLETE  Result Date: 09/12/2019    ECHOCARDIOGRAM REPORT   Patient Name:   Kelly Roberson Date of Exam: 09/12/2019 Medical Rec #:  [id number]      Height:       68.0 in Accession #:    [id number]     Weight:       213.4 lb Date of Birth:  Oct 06, 1960     BSA:          2.101 m Patient Age:    40 years       BP:           142/88 mmHg Patient Gender: F              HR:           99 bpm. Exam Location:  Inpatient Procedure: 2D Echo Indications:    Acute Respiratory Insufficiency R06.89  History:        Patient has no prior history of Echocardiogram examinations.  Risk Factors:Hypertension.  Sonographer:    Mikki Santee RDCS (AE) Referring Phys: [place]  1. Left ventricular ejection fraction, by estimation, is 50 to 55%. The left ventricle has low normal function. The left ventricle has no regional wall motion abnormalities. The left ventricular internal cavity size was mildly dilated. Left ventricular diastolic parameters are consistent with Grade II diastolic dysfunction (pseudonormalization).  2. Right ventricular systolic function is normal. The right ventricular size is normal. There is moderately elevated pulmonary artery systolic pressure.  3. Left atrial size was moderately dilated.  4. Right atrial size was mildly dilated.  5. The mitral valve is normal in structure. Mild mitral valve regurgitation.  6. The aortic valve is tricuspid. Aortic valve regurgitation is not visualized. No aortic stenosis is present.  7. The inferior vena cava is dilated in size with <50% respiratory variability, suggesting right atrial pressure of 15 mmHg. Comparison(s): No prior Echocardiogram. FINDINGS  Left Ventricle: Left ventricular ejection fraction, by estimation, is 50 to 55%. The left ventricle has low normal  function. The left ventricle has no regional wall motion abnormalities. The left ventricular internal cavity size was mildly dilated. There is no left ventricular hypertrophy. Left ventricular diastolic parameters are consistent with Grade II diastolic dysfunction (pseudonormalization). Right Ventricle: The right ventricular size is normal. No increase in right ventricular wall thickness. Right ventricular systolic function is normal. There is moderately elevated pulmonary artery systolic pressure. The tricuspid regurgitant velocity is 2.91 m/s, and with an assumed right atrial pressure of 15 mmHg, the estimated right ventricular systolic pressure is 94.7 mmHg. Left Atrium: Left atrial size was moderately dilated. Right Atrium: Right atrial size was mildly dilated. Pericardium: There is no evidence of pericardial effusion. Mitral Valve: The mitral valve is normal in structure. Mild to moderate mitral annular calcification. Mild mitral valve regurgitation. Tricuspid Valve: The tricuspid valve is normal in structure. Tricuspid valve regurgitation is mild . No evidence of tricuspid stenosis. Aortic Valve: The aortic valve is tricuspid. Aortic valve regurgitation is not visualized. No aortic stenosis is present. Pulmonic Valve: The pulmonic valve was not well visualized. Pulmonic valve regurgitation is trivial. No evidence of pulmonic stenosis. Aorta: The aortic root, ascending aorta and aortic arch are all structurally normal, with no evidence of dilitation or obstruction. Venous: The inferior vena cava is dilated in size with less than 50% respiratory variability, suggesting right atrial pressure of 15 mmHg. IAS/Shunts: The atrial septum is grossly normal. Additional Comments: A venous catheter is visualized in the right atrium.  LEFT VENTRICLE PLAX 2D LVIDd:         5.10 cm  Diastology LVIDs:         3.80 cm  LV e' medial:    6.89 cm/s LV PW:         1.10 cm  LV E/e' medial:  11.7 LV IVS:        1.00 cm  LV e'  lateral:   6.24 cm/s LVOT diam:     2.20 cm  LV E/e' lateral: 12.9 LV SV:         68 LV SV Index:   33 LVOT Area:     3.80 cm  RIGHT VENTRICLE RV S prime:     14.30 cm/s TAPSE (M-mode): 2.0 cm LEFT ATRIUM             Index       RIGHT ATRIUM           Index LA diam:  3.70 cm 1.76 cm/m  RA Area:     19.30 cm LA Vol (A2C):   93.7 ml 44.60 ml/m RA Volume:   57.00 ml  27.13 ml/m LA Vol (A4C):   52.4 ml 24.94 ml/m LA Biplane Vol: 72.7 ml 34.60 ml/m  AORTIC VALVE LVOT Vmax:   88.30 cm/s LVOT Vmean:  67.000 cm/s LVOT VTI:    0.180 m  AORTA Ao Root diam: 3.10 cm MITRAL VALVE               TRICUSPID VALVE MV Area (PHT): 3.99 cm    TR Peak grad:   33.9 mmHg MV Decel Time: 190 msec    TR Vmax:        291.00 cm/s MV E velocity: 80.60 cm/s MV A velocity: 72.00 cm/s  SHUNTS MV E/A ratio:  1.12        Systemic VTI:  0.18 m                            Systemic Diam: 2.20 cm Buford Dresser MD Electronically signed by Buford Dresser MD Signature Date/Time: 09/12/2019/6:19:25 PM    Final    VAS [place] UPPER EXTREMITY VENOUS DUPLEX  Result Date: 09/13/2019 UPPER VENOUS STUDY  Indications: Swelling Risk Factors: None identified. Limitations: Poor ultrasound/tissue interface, bandages and line. Comparison Study: No prior studies. Performing Technologist: Oliver Hum RVT  Examination Guidelines: A complete evaluation includes B-mode imaging, spectral Doppler, color Doppler, and power Doppler as needed of all accessible portions of each vessel. Bilateral testing is considered an integral part of a complete examination. Limited examinations for reoccurring indications may be performed as noted.  Right Findings: +----------+------------+---------+-----------+----------+-------+ RIGHT     CompressiblePhasicitySpontaneousPropertiesSummary +----------+------------+---------+-----------+----------+-------+ IJV           Full       Yes       Yes                       +----------+------------+---------+-----------+----------+-------+ Subclavian    Full       Yes       Yes                      +----------+------------+---------+-----------+----------+-------+ Axillary      Full       Yes       Yes                      +----------+------------+---------+-----------+----------+-------+ Brachial      Full       Yes       Yes                      +----------+------------+---------+-----------+----------+-------+ Radial        Full                                          +----------+------------+---------+-----------+----------+-------+ Ulnar         Full                                          +----------+------------+---------+-----------+----------+-------+ Cephalic      Full                                          +----------+------------+---------+-----------+----------+-------+  Basilic       Full                                          +----------+------------+---------+-----------+----------+-------+  Left Findings: +----------+------------+---------+-----------+----------+-------+ LEFT      CompressiblePhasicitySpontaneousPropertiesSummary +----------+------------+---------+-----------+----------+-------+ Subclavian    Full       Yes       Yes                      +----------+------------+---------+-----------+----------+-------+  Summary:  Right: No evidence of deep vein thrombosis in the upper extremity. No evidence of superficial vein thrombosis in the upper extremity.  Left: No evidence of thrombosis in the subclavian.  *See table(s) above for measurements and observations.  Diagnosing physician: Servando Snare MD Electronically signed by Servando Snare MD on 09/13/2019 at 9:17:22 AM.    Final      Scheduled Meds: . sodium chloride   Intravenous Once  . Chlorhexidine Gluconate Cloth  6 each Topical Daily  . diphenhydrAMINE  25 mg Oral Once  . furosemide  20 mg Intravenous Once  . multivitamin with  minerals  1 tablet Oral Daily  . sodium chloride flush  10-40 mL Intracatheter Q12H  . tenofovir  300 mg Oral Daily   Continuous Infusions: . ceFEPime (MAXIPIME) IV 2 g (09/14/19 0237)  . diltiazem (CARDIZEM) infusion Stopped (09/13/19 0800)     LOS: 5 days    Time spent: [place], MD [facility] To contact the attending provider between 7A-7P or the covering provider during after hours 7P-7A, please log into the web site [email or web address] and access using universal [place] password for that web site. If you do not have the password, please call the hospital operator.  09/14/2019, 7:53 AM

## 2019-09-15 ENCOUNTER — Inpatient Hospital Stay (HOSPITAL_COMMUNITY): Payer: Medicaid Other

## 2019-09-15 LAB — MAGNESIUM: Magnesium: 1.8 mg/dL (ref 1.7–2.4)

## 2019-09-15 LAB — COMPREHENSIVE METABOLIC PANEL
ALT: 21 U/L (ref 0–44)
AST: 15 U/L (ref 15–41)
Albumin: 2.6 g/dL — ABNORMAL LOW (ref 3.5–5.0)
Alkaline Phosphatase: 74 U/L (ref 38–126)
Anion gap: 10 (ref 5–15)
BUN: 8 mg/dL (ref 6–20)
CO2: 26 mmol/L (ref 22–32)
Calcium: 8.5 mg/dL — ABNORMAL LOW (ref 8.9–10.3)
Chloride: 102 mmol/L (ref 98–111)
Creatinine, Ser: 0.35 mg/dL — ABNORMAL LOW (ref 0.44–1.00)
GFR calc Af Amer: >60 mL/min (ref >60)
GFR calc non Af Amer: >60 mL/min (ref >60)
Glucose, Bld: 91 mg/dL (ref 70–99)
Potassium: 2.9 mmol/L — ABNORMAL LOW (ref 3.5–5.1)
Sodium: 138 mmol/L (ref 135–145)
Total Bilirubin: 1.2 mg/dL (ref 0.3–1.2)
Total Protein: 5.4 g/dL — ABNORMAL LOW (ref 6.5–8.1)

## 2019-09-15 LAB — CBC WITH DIFFERENTIAL/PLATELET
Abs Immature Granulocytes: 0.11 10*3/uL — ABNORMAL HIGH (ref 0.00–0.07)
Basophils Absolute: 0 10*3/uL (ref 0.0–0.1)
Basophils Relative: 1 %
Eosinophils Absolute: 0 10*3/uL (ref 0.0–0.5)
Eosinophils Relative: 0 %
HCT: 23.6 % — ABNORMAL LOW (ref 36.0–46.0)
Hemoglobin: 7.8 g/dL — ABNORMAL LOW (ref 12.0–15.0)
Immature Granulocytes: 6 %
Lymphocytes Relative: 18 %
Lymphs Abs: 0.4 10*3/uL — ABNORMAL LOW (ref 0.7–4.0)
MCH: 31.7 pg (ref 26.0–34.0)
MCHC: 33.1 g/dL (ref 30.0–36.0)
MCV: 95.9 fL (ref 80.0–100.0)
Monocytes Absolute: 0.4 10*3/uL (ref 0.1–1.0)
Monocytes Relative: 20 %
Neutro Abs: 1.1 10*3/uL — ABNORMAL LOW (ref 1.7–7.7)
Neutrophils Relative %: 55 %
Platelets: 19 10*3/uL — CL (ref 150–400)
RBC: 2.46 MIL/uL — ABNORMAL LOW (ref 3.87–5.11)
RDW: 17.8 % — ABNORMAL HIGH (ref 11.5–15.5)
WBC Morphology: INCREASED
WBC: 2 10*3/uL — ABNORMAL LOW (ref 4.0–10.5)
nRBC: 0 % (ref 0.0–0.2)

## 2019-09-15 MED ORDER — MAGNESIUM OXIDE 400 (241.3 MG) MG PO TABS
800.0000 mg | ORAL_TABLET | Freq: Two times a day (BID) | ORAL | Status: DC
  Administered 2019-09-15 – 2019-09-17 (×6): 800 mg via ORAL
  Filled 2019-09-15 (×6): qty 2

## 2019-09-15 MED ORDER — MAGIC MOUTHWASH W/LIDOCAINE
2.0000 mL | Freq: Three times a day (TID) | ORAL | Status: DC
  Administered 2019-09-15 – 2019-09-17 (×5): 2 mL via ORAL
  Filled 2019-09-15 (×11): qty 5

## 2019-09-15 MED ORDER — POTASSIUM CHLORIDE CRYS ER 20 MEQ PO TBCR
40.0000 meq | EXTENDED_RELEASE_TABLET | Freq: Two times a day (BID) | ORAL | Status: DC
  Administered 2019-09-15: 40 meq via ORAL
  Filled 2019-09-15: qty 2

## 2019-09-15 MED ORDER — POTASSIUM CHLORIDE 20 MEQ PO PACK
40.0000 meq | PACK | Freq: Two times a day (BID) | ORAL | Status: DC
  Administered 2019-09-15 – 2019-09-17 (×6): 40 meq via ORAL
  Filled 2019-09-15 (×6): qty 2

## 2019-09-15 NOTE — Progress Notes (Signed)
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 Mauri Pole received G-CSF-09/02/2019  Previous [facility] 07/29/2019 bloody diarrhea abdominal pain-ANC 300 platelets less than 5 hemoglobin 6-blood cultures = coagulase negative staph CT abdomen pelvis = mild colitis-evaluated by GI and at that time received 4 units PRBC, 2 units platelet she was discharged 08/03/2019 to complete Cipro and Flagyl  She was admitted to [facility] 08/28/2019 for consolidation cycle 3 receiving cytarabine through PICC line and was discharged on acyclovir 400 twice daily and levofloxacin 500 for 14 days total  Admitted by critical care 9/6 febrile neutropenia T-max 100.9 hemoglobin 9.4 platelet less than 5 Developed GI bleed-hemoglobin dropped to 5-given 3 total units of platelets, transfused 2 units of blood Developed A. fib with RVR 9/9 placed on diltiazem and converted to sinus She was on Precedex while in the ICU secondary to ICU delirium and she was having visual hallucinations as of 9/10   Assessment & Plan:   Principal Problem:   Severe sepsis ([place]) Active Problems:   Essential hypertension   AML (acute myeloblastic leukemia) ([place])   Hypokalemia   Pancytopenia ([place])   Sepsis ([place])    1. Febrile neutropenia with gram-negative bacteremia Enterobacter cloacae 2. ?  Pneumonia in addition a. Continue empiric cefepime at this time b. 1 view x-ray 9/8 showed questionable pneumonia c. 2 view x-ray is pending from 9/12 d. Likely narrow antibiotics in 24 hours depending on platelet count and clinical course e. Had a low-grade temperature of 100.09/11 PM 3. AML on cycle 3 of therapy followed at [facility] 4. Severe thrombocytopenia and anemia secondary to blood  loss superimposed on  chemotherapy for her AML managed at [facility] and by [name] locally a. 2 units PRBC/3 units platelets 9/7, 2 units platelets 9/8 b. 2 further units platelets 9/11  c. Received Neulasta 8/30 at oncology office and expect counts to improve-threshold for platelet transfusion-below 20 d. Her platelets have improved her white count status improved and her ANC is improved-likely can discontinue febrile neutropenic precautions a.m. e. We will alert [name] regarding clinical progress and anticipate further input on 9/13 regarding plan of care 5. Probable stomatitis secondary to chemo a. Will start Magic mouthwash b. If this worsens we will have to consider super infection or opportunistic infection as cause for the same I do not appreciate any thrush today 6. ?  ICU delirium 9/9, 9/10 a. Was on Precedex in ICU and had safety sitter which is now discontinued and this is resolved 7. Paroxysmal atrial fibrillation CHADS2 score >3 9/9-not a candidate currently for any type of anticoagulation given severe thrombocytopenia/anemia and this has not recurred 8. EF 50-55% with moderately elevated PASP a. Loaded with amiodarone by critical care and placed on Cardizem gtt. 9/9 b. Currently Cardizem 120 CD 9/11 for tachycardia (she is in sinus) 9. Holding diuretics at this time 10. Moderate hypokalemia a. Replace with 40 mEq twice daily K-Lor as she cannot tolerate K. Dur b. Magnesium is okay at 1.8 11. Acute respiratory failure earlier in admission secondary to volume overload-echocardiogram elevated right arterial pressures with diastolic dysfunction a. Received Lasix earlier in admission previously was on oxygen b. Now off oxygen since ICU stay c. Chest x-ray as above 12.  acute blood loss anemia?  Bleed/undefined cause as no scope can be performed safely with severe thrombocytopenia 13. Possibility of?  Lower GI bleed without colitis a. No further reports of bleeding and  unsafe to scope--- if recurrent bleed will need CTA b. Will need to call GI for further bleeding occurs as she is full code 14. DVT ruled out by ultrasound right upper extremity 9/10 15. Prior hepatitis B  a. Continue tenofovir   DVT prophylaxis: SCD Code Status: Full Family Communication: Discussed with patient alone she states she will update family herself Disposition:   Status is: Inpatient  Remains inpatient appropriate because:Hemodynamically unstable and IV treatments appropriate due to intensity of illness or inability to take PO   Dispo: The patient is from: Home              Anticipated d/c is to: Home              Anticipated d/c date is: 3 days              Patient currently is not medically stable to d/c.       Consultants:   Oncology  Procedures: Multiple  Antimicrobials: Cefepime currently   Subjective: No distress  CoherentPleasant awake alert  No pain No dark stool tarry stool Feels like she has some rawness in her mouth but does not have difficulty swallowing  Objective: Vitals:   09/14/19 1512 09/14/19 2111 09/15/19 0430 09/15/19 0527  BP: (!) 145/78 (!) 147/78  (!) 156/89  Pulse: 100 99  94  Resp:  16  20  Temp: 98.8 F (37.1 C) 100 F (37.8 C)  98.8 F (37.1 C)  TempSrc: Oral Oral  Oral  SpO2: 95% 90%  91%  Weight:   90 kg   Height:        Intake/Output Summary (Last 24 hours) at 09/15/2019 0934 Last data filed at 09/14/2019 2111 Gross per 24 hour  Intake 480 ml  Output --  Net 480 ml   Filed Weights   09/13/19 0500 09/14/19 7619 09/15/19 0430  Weight: 94.7 kg 93.4 kg 90 kg    Examination:  General exam: EOMI NCAT flat affect no stomatitis although does have one small ulcer under her tongue Anicteric mild pallor Respiratory system: No rales rhonchi wheeze TVR TVF Cardiovascular system: S1-S2 sinus rhythm less tachycardic than yesterday Gastrointestinal system: No rebound no guarding  Data Reviewed: I have personally  reviewed following labs and imaging studies Potassium down from 3.8-3.2 BUNs/creatinine 12/0.4-->9/0.3 AST/ALT 16/25 WBC 0.6, hemoglobin down from 8.9-8.0, platelet 13-->8 ANC 300  Radiology Studies: No results found.   Scheduled Meds: . Chlorhexidine Gluconate Cloth  6 each Topical Daily  . diltiazem  120 mg Oral Daily  . magnesium oxide  800 mg Oral BID  . multivitamin with minerals  1 tablet Oral Daily  . potassium chloride  40 mEq Oral BID  . sodium chloride flush  10-40 mL Intracatheter Q12H  . tenofovir  300 mg Oral Daily   Continuous Infusions: . ceFEPime (MAXIPIME) IV 2 g (09/15/19 0714)     LOS: 6 days    Time spent: [place], MD [facility] To contact the attending provider between 7A-7P or the covering provider during after hours 7P-7A, please log into the web site [email or web address] and access using universal [place] password for that web site. If you do not have the password, please call the hospital operator.  09/15/2019, 9:34 AM

## 2019-09-16 ENCOUNTER — Inpatient Hospital Stay: Payer: Medicaid Other

## 2019-09-16 ENCOUNTER — Other Ambulatory Visit: Payer: Medicaid Other

## 2019-09-16 ENCOUNTER — Ambulatory Visit: Payer: Medicaid Other

## 2019-09-16 LAB — CBC WITH DIFFERENTIAL/PLATELET
Abs Immature Granulocytes: 0.09 10*3/uL — ABNORMAL HIGH (ref 0.00–0.07)
Basophils Absolute: 0 10*3/uL (ref 0.0–0.1)
Basophils Relative: 0 %
Eosinophils Absolute: 0 10*3/uL (ref 0.0–0.5)
Eosinophils Relative: 0 %
HCT: 26.1 % — ABNORMAL LOW (ref 36.0–46.0)
Hemoglobin: 8.5 g/dL — ABNORMAL LOW (ref 12.0–15.0)
Immature Granulocytes: 3 %
Lymphocytes Relative: 24 %
Lymphs Abs: 0.6 10*3/uL — ABNORMAL LOW (ref 0.7–4.0)
MCH: 31.4 pg (ref 26.0–34.0)
MCHC: 32.6 g/dL (ref 30.0–36.0)
MCV: 96.3 fL (ref 80.0–100.0)
Monocytes Absolute: 0.6 10*3/uL (ref 0.1–1.0)
Monocytes Relative: 22 %
Neutro Abs: 1.3 10*3/uL — ABNORMAL LOW (ref 1.7–7.7)
Neutrophils Relative %: 51 %
Platelets: 19 10*3/uL — CL (ref 150–400)
RBC: 2.71 MIL/uL — ABNORMAL LOW (ref 3.87–5.11)
RDW: 17.8 % — ABNORMAL HIGH (ref 11.5–15.5)
WBC: 2.7 10*3/uL — ABNORMAL LOW (ref 4.0–10.5)
nRBC: 0 % (ref 0.0–0.2)

## 2019-09-16 LAB — COMPREHENSIVE METABOLIC PANEL
ALT: 20 U/L (ref 0–44)
AST: 18 U/L (ref 15–41)
Albumin: 2.8 g/dL — ABNORMAL LOW (ref 3.5–5.0)
Alkaline Phosphatase: 83 U/L (ref 38–126)
Anion gap: 8 (ref 5–15)
BUN: 6 mg/dL (ref 6–20)
CO2: 27 mmol/L (ref 22–32)
Calcium: 8.5 mg/dL — ABNORMAL LOW (ref 8.9–10.3)
Chloride: 102 mmol/L (ref 98–111)
Creatinine, Ser: 0.31 mg/dL — ABNORMAL LOW (ref 0.44–1.00)
GFR calc Af Amer: >60 mL/min (ref >60)
GFR calc non Af Amer: >60 mL/min (ref >60)
Glucose, Bld: 103 mg/dL — ABNORMAL HIGH (ref 70–99)
Potassium: 3.2 mmol/L — ABNORMAL LOW (ref 3.5–5.1)
Sodium: 137 mmol/L (ref 135–145)
Total Bilirubin: 1.1 mg/dL (ref 0.3–1.2)
Total Protein: 5.6 g/dL — ABNORMAL LOW (ref 6.5–8.1)

## 2019-09-16 LAB — PREPARE PLATELET PHERESIS
Unit division: 0
Unit division: 0

## 2019-09-16 LAB — BPAM PLATELET PHERESIS
Blood Product Expiration Date: 202109132359
Blood Product Expiration Date: 202109132359
ISSUE DATE / TIME: 202109111024
ISSUE DATE / TIME: 202109111116
Unit Type and Rh: 6200
Unit Type and Rh: 6200

## 2019-09-16 LAB — MAGNESIUM: Magnesium: 1.8 mg/dL (ref 1.7–2.4)

## 2019-09-16 MED ORDER — KCL-LACTATED RINGERS 20 MEQ/L IV SOLN
INTRAVENOUS | Status: DC
  Filled 2019-09-16 (×2): qty 1000

## 2019-09-16 MED ORDER — LEVOFLOXACIN 750 MG PO TABS
750.0000 mg | ORAL_TABLET | Freq: Every day | ORAL | Status: DC
  Administered 2019-09-16 – 2019-09-18 (×3): 750 mg via ORAL
  Filled 2019-09-16 (×3): qty 1

## 2019-09-16 MED ORDER — POTASSIUM CHLORIDE 2 MEQ/ML IV SOLN
INTRAVENOUS | Status: DC
  Filled 2019-09-16 (×2): qty 1000

## 2019-09-16 NOTE — Progress Notes (Signed)
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 Mauri Pole received G-CSF-09/02/2019  Previous admission [facility] 07/29/2019 bloody diarrhea abdominal pain-ANC 300 platelets less than 5 hemoglobin 6-blood cultures = coagulase negative staph CT abdomen pelvis = mild colitis-evaluated by GI and at that time received 4 units PRBC, 2 units platelet she was discharged 08/03/2019 to complete Cipro and Flagyl  She was admitted to [facility] 08/28/2019 for consolidation cycle 3 receiving cytarabine through PICC line and was discharged on acyclovir 400 twice daily and levofloxacin 500 for 14 days total  Admitted by critical care 9/6 febrile neutropenia T-max 100.9 hemoglobin 9.4 platelet less than 5 Developed GI bleed-hemoglobin dropped to 5-given 3 total units of platelets, transfused 2 units of blood Developed A. fib with RVR 9/9 placed on diltiazem and converted to sinus She was on Precedex while in the ICU secondary to ICU delirium and she was having visual hallucinations as of 9/10 She has improved steadily with counts improving and is being scheduled for close outpatient follow-up with oncology  Assessment & Plan:   Principal Problem:   Severe sepsis ([place]) Active Problems:   Essential hypertension   AML (acute myeloblastic leukemia) ([place])   Hypokalemia   Pancytopenia ([place])   Sepsis ([place])    1. Febrile neutropenia with gram-negative bacteremia Enterobacter cloacae 2. ?  Pneumonia in addition a. Cefepime-->Levaquin stopping on 9/19 [14 D] b. 1 view x-ray 9/8 showed questionable pneumonia-?  Atelectasis on repeat film 9/12 3. AML on cycle 3 of therapy followed at [facility] 4. Severe thrombocytopenia and anemia secondary to blood loss superimposed on   chemotherapy for her AML managed at [facility] and by [name] locally a. 2 units PRBC/3 units platelets 9/7, 2 units platelets 9/8 b. 2 further units platelets 9/11  c. Received Neulasta 8/30 at oncology office and expect counts to improve-threshold for platelet transfusion-below 20 d. ANC improved discontinue neutropenic precautions in a.m. 5. Probable stomatitis secondary to chemo a. Will start Magic mouthwash b. If this worsens we will have to consider super infection or opportunistic infection 6. ?  ICU delirium 9/9, 9/10 a. Was on Precedex in ICU and had safety sitter which is now discontinued and this is resolved 7. Paroxysmal atrial fibrillation CHADS2 score >3 9/9-not a candidate currently for any type of anticoagulation given severe thrombocytopenia/anemia and this has not recurred 8. EF 50-55% with moderately elevated PASP a. Loaded with amiodarone by critical care and placed on Cardizem gtt. 9/9 b. Currently Cardizem 120 CD 9/11 for tachycardia (she is in sinus) c. Outpatient coordination of care by PCP oncology and potentially cardiology 9. Holding diuretics at this time 10. Moderate hypokalemia a. Replacing with both IV and p.o. potassium b. Magnesium is okay at 1.8 11. Acute respiratory failure earlier in admission secondary to volume overload-echocardiogram elevated right arterial pressures with diastolic dysfunction a. Received Lasix earlier in admission previously was on oxygen b. Now off oxygen since ICU stay c. Chest x-ray as above 12.  acute blood loss anemia?  Bleed/undefined cause as no scope can be performed safely with severe thrombocytopenia 13. Possibility of?  Lower GI bleed without colitis a. No further reports of bleeding and unsafe to scope--- if recurrent bleed will need CTA b. Will need to call GI for further bleeding  occurs as she is full code 14. DVT ruled out by ultrasound right upper extremity 9/10 15. Prior hepatitis B  a. Continue  tenofovir   DVT prophylaxis: SCD Code Status: Full Family Communication: Discussed with patient alone on several days Disposition:   Status is: Inpatient  Remains inpatient appropriate because:Hemodynamically unstable and IV treatments appropriate due to intensity of illness or inability to take PO   Dispo: The patient is from: Home              Anticipated d/c is to: Home              Anticipated d/c date is: 1 day              Patient currently is not medically stable to d/c.       Consultants:   Oncology  Procedures: Multiple  Antimicrobials: Cefepime currently   Subjective:  Improving feels better not to hungry but otherwise is well no chest pain no fever no sputum no chills no nausea no vomiting Multiple questions about bacteremia and A. fib all of which were answered in detail Objective: Vitals:   09/15/19 2140 09/16/19 0625 09/16/19 0700 09/16/19 1420  BP: (!) 166/77 (!) 146/77  139/78  Pulse: 96 89  86  Resp: 17 20  15   Temp: 98.3 F (36.8 C) 98.6 F (37 C)  98.2 F (36.8 C)  TempSrc: Oral Oral  Oral  SpO2: 97% 93%  99%  Weight:   90.6 kg   Height:        Intake/Output Summary (Last 24 hours) at 09/16/2019 1420 Last data filed at 09/16/2019 0915 Gross per 24 hour  Intake 940 ml  Output --  Net 940 ml   Filed Weights   09/14/19 0652 09/15/19 0430 09/16/19 0700  Weight: 93.4 kg 90 kg 90.6 kg    Examination:  General exam: Pleasant flat affect to some degree but smiling some today No stomatitis Anicteric mild pallor Respiratory system: No rales no wheezes Cardiovascular system: S1-S2 sinus rhythm no murmur Gastrointestinal system: No rebound no guarding-slight distention  Data Reviewed: I have personally reviewed following labs and imaging studies Potassium down from 3.8-3.2 BUNs/creatinine 12/0.4--> 6/0.3 LFTs normal WBC currently 2.7 hemoglobin 8.5 ANC 1300 platelet 19  Radiology Studies: DG Chest 2 View  Result Date:  09/15/2019 CLINICAL DATA:  Pneumonia shortness of breath, leukemia history of hypertension. EXAM: CHEST - 2 VIEW COMPARISON:  09/11/2019 FINDINGS: RIGHT-sided PICC line terminates at the caval to atrial junction. Trachea midline. Cardiomediastinal contours and hilar structures are normal. Elevation of the RIGHT hemidiaphragm as before with airspace disease overlying the RIGHT hemidiaphragm and mild blunting of RIGHT costodiaphragmatic sulcus suggesting small effusion. On limited assessment skeletal structures without acute process. IMPRESSION: RIGHT basilar airspace disease and possible small RIGHT effusion. Airspace process may represent developing infection or juxta diaphragmatic atelectasis in the setting of elevated RIGHT hemidiaphragm. Interval improvement with respect aeration at the LEFT lung base. Electronically Signed   By: Zetta Bills M.D.   On: 09/15/2019 13:01   Scheduled Meds: . Chlorhexidine Gluconate Cloth  6 each Topical Daily  . diltiazem  120 mg Oral Daily  . levofloxacin  750 mg Oral Daily  . magic mouthwash w/lidocaine  2 mL Oral TID  . magnesium oxide  800 mg Oral BID  . multivitamin with minerals  1 tablet Oral Daily  . potassium chloride  40 mEq Oral BID  . sodium chloride flush  10-40 mL Intracatheter Q12H  .  tenofovir  300 mg Oral Daily   Continuous Infusions: . lactated ringers with KCl 20 mEq/L 50 mL/hr at 09/16/19 1004     LOS: 7 days    Time spent: 35  Nita Sells, MD [facility] To contact the attending provider between 7A-7P or the covering provider during after hours 7P-7A, please log into the web site [email or web address] and access using universal [place] password for that web site. If you do not have the password, please call the hospital operator.  09/16/2019, 2:20 PM

## 2019-09-16 NOTE — Progress Notes (Signed)
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 Found to have pancytopenia and peripheral blasts with intracellular inclusions.  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 4) 05/16/2019: repeat Bmbx shows no residual leukemia 5) 06/17/2019: patient returned for Cycle 1 Consolidation with cytarabine and gemtuzumab at [facility] 6) 06/24/2019: presents to reconnect with [facility] for co-managed care. 7) 07/15/2019: start of Cycle 2 of consolidation therapyat [facility]  8) 07/30/2019: admitted for BRBPR/ Infectious colitis.  9) 08/26/2019: start of Cycle 3 of consolidation therapy at [facility]  10) 09/09/2019: admitted with neutropenic fever and BRBPR  Interval History Remains afebrile.  Denies bleeding.  Denies abdominal pain, nausea, vomiting.  Confusion overall improved, but still has intermittent periods of delusions from the ICU.  REVIEW OF SYSTEMS:   Constitutional: Denies fevers, chills, reports generalized weakness Ears, nose, mouth, throat, and face: Denies mucositis or sore throat Respiratory: Denies cough, dyspnea or wheezes Cardiovascular: Denies palpitation, chest discomfort Gastrointestinal:  Denies nausea, heartburn or change in bowel habits, denies recurrent blood in her stool Skin: Denies abnormal skin rashes Lymphatics: Denies new lymphadenopathy or easy bruising Neurological:Denies numbness, tingling or new weaknesses Behavioral/Psych: Mood is stable, no new changes  Extremities: No lower extremity edema All other systems were reviewed with the patient and are negative.  I have reviewed the past medical history, past surgical history, social history and family history with  the patient and they are unchanged from previous note.   PHYSICAL EXAMINATION: ECOG PERFORMANCE STATUS: 2 - Symptomatic, <50% confined to bed  Vitals:   09/15/19 2140 09/16/19 0625  BP: (!) 166/77 (!) 146/77  Pulse: 96 89  Resp: 17 20  Temp: 98.3 F (36.8 C) 98.6 F (37 C)  SpO2: 97% 93%   Filed Weights   09/14/19 0652 09/15/19 0430 09/16/19 0700  Weight: 93.4 kg 90 kg 90.6 kg    Intake/Output from previous day: 09/12 0701 - 09/13 0700 In: 700 [IV Piggyback:700] Out: -   GENERAL: Chronically ill-appearing female, no distress SKIN: Multiple ecchymoses, particularly to her right arm EYES: No scleral icterus OROPHARYNX:no exudate, no erythema and lips, buccal mucosa, and tongue normal  LUNGS: Rales to bilateral bases HEART: regular rate & rhythm and no murmurs  ABDOMEN:abdomen soft, non-tender and normal bowel sounds NEURO: alert & oriented x 3 with fluent speech, no focal motor/sensory deficits  LABORATORY DATA:  I have reviewed the data as listed CMP Latest Ref Rng & Units 09/16/2019 09/15/2019 09/14/2019  Glucose 70 - 99 mg/dL 103(H) 91 102(H)  BUN 6 - 20 mg/dL 6 8 9   Creatinine 0.44 - 1.00 mg/dL 0.31(L) 0.35(L) 0.37(L)  Sodium 135 - 145 mmol/L 137 138 138  Potassium 3.5 - 5.1 mmol/L 3.2(L) 2.9(L) 3.2(L)  Chloride 98 - 111 mmol/L 102 102 103  CO2 22 - 32 mmol/L 27 26 26   Calcium 8.9 - 10.3 mg/dL 8.5(L) 8.5(L) 8.3(L)  Total Protein 6.5 - 8.1 g/dL 5.6(L) 5.4(L) 5.3(L)  Total Bilirubin 0.3 - 1.2 mg/dL 1.1 1.2 1.5(H)  Alkaline Phos 38 - 126 U/L 83 74 73  AST 15 - 41 U/L 18 15 16   ALT 0 - 44 U/L 20 21 25     Lab Results  Component Value Date   WBC 2.7 (L) 09/16/2019   HGB  8.5 (L) 09/16/2019   HCT 26.1 (L) 09/16/2019   MCV 96.3 09/16/2019   PLT 19 (LL) 09/16/2019   NEUTROABS 1.3 (L) 09/16/2019    DG Chest 1 View  Result Date: 09/11/2019 CLINICAL DATA:  Hypoxemia EXAM: CHEST  1 VIEW COMPARISON:  09/09/2019 FINDINGS: Right upper extremity central venous catheter  tip over the SVC. Streaky atelectasis right base. Interim development of patchy airspace disease in the left mid and lower lung. Stable cardiomediastinal silhouette. No pneumothorax. IMPRESSION: Interim development of patchy airspace disease in the left mid and lower lung, possible pneumonia. Electronically Signed   By: Donavan Foil M.D.   On: 09/11/2019 18:57   DG Chest 2 View  Result Date: 09/15/2019 CLINICAL DATA:  Pneumonia shortness of breath, leukemia history of hypertension. EXAM: CHEST - 2 VIEW COMPARISON:  09/11/2019 FINDINGS: RIGHT-sided PICC line terminates at the caval to atrial junction. Trachea midline. Cardiomediastinal contours and hilar structures are normal. Elevation of the RIGHT hemidiaphragm as before with airspace disease overlying the RIGHT hemidiaphragm and mild blunting of RIGHT costodiaphragmatic sulcus suggesting small effusion. On limited assessment skeletal structures without acute process. IMPRESSION: RIGHT basilar airspace disease and possible small RIGHT effusion. Airspace process may represent developing infection or juxta diaphragmatic atelectasis in the setting of elevated RIGHT hemidiaphragm. Interval improvement with respect aeration at the LEFT lung base. Electronically Signed   By: Zetta Bills M.D.   On: 09/15/2019 13:01   CT Head Wo Contrast  Result Date: 09/10/2019 CLINICAL DATA:  Headache. Tremor and fever starting yesterday. History of leukemia. Chemotherapy last Wednesday. Nose surgery on Friday. EXAM: CT HEAD WITHOUT CONTRAST TECHNIQUE: Contiguous axial images were obtained from the base of the skull through the vertex without intravenous contrast. COMPARISON:  07/01/2019 FINDINGS: Brain: Mild cerebral atrophy. No ventricular dilatation. Patchy low-attenuation changes in the deep white matter consistent with small vessel ischemic changes and lacunar infarcts. No mass effect or midline shift. No abnormal extra-axial fluid collections. Gray-white matter junctions  are distinct. Basal cisterns are not effaced. No acute intracranial hemorrhage. Vascular: Intracranial arterial vascular calcifications are present. Skull: The calvarium appears intact. Sinuses/Orbits: Paranasal sinuses and mastoid air cells are clear. Other: None. IMPRESSION: 1. No acute intracranial abnormalities. 2. Chronic atrophy and small vessel ischemic changes. Electronically Signed   By: Lucienne Capers M.D.   On: 09/10/2019 01:05   CT ABDOMEN PELVIS W CONTRAST  Result Date: 09/09/2019 CLINICAL DATA:  Leukemia.  Recent chemotherapy.  Fever. EXAM: CT ABDOMEN AND PELVIS WITH CONTRAST TECHNIQUE: Multidetector CT imaging of the abdomen and pelvis was performed using the standard protocol following bolus administration of intravenous contrast. CONTRAST:  168mL OMNIPAQUE IOHEXOL 300 MG/ML  SOLN COMPARISON:  07/29/2019 FINDINGS: Lower chest: Linear scarring or atelectasis in the right lung base. No effusions or acute abnormality. Hepatobiliary: Low-density lesion posteriorly in the liver is stable since prior study measuring 1.7 cm. This is likely hemangioma. No suspicious Vac abnormality. Gallbladder unremarkable. Pancreas: No focal abnormality or ductal dilatation. Spleen: No focal abnormality.  Normal size. Adrenals/Urinary Tract: No adrenal abnormality. No focal renal abnormality. No stones or hydronephrosis. Urinary bladder is unremarkable. Stomach/Bowel: There is fatty proliferation within the right colonic wall, likely related to prior inflammatory process/infection. No evidence for active enteritis/colitis. Prior gastric bypass. Vascular/Lymphatic: No evidence of aneurysm or adenopathy. Scattered aortic calcifications. Reproductive: Uterus and adnexa unremarkable.  No mass. Other: No free fluid or free air. Musculoskeletal: No acute bony abnormality. IMPRESSION: No acute findings in the abdomen or pelvis. Electronically Signed  By: Rolm Baptise M.D.   On: 09/09/2019 22:12   DG Chest Port 1  View  Result Date: 09/09/2019 CLINICAL DATA:  Leukemia patient.  Fever.  Questionable sepsis. EXAM: PORTABLE CHEST 1 VIEW COMPARISON:  07/29/2019 FINDINGS: Cardiac silhouette is normal in size. No mediastinal or hilar masses or evidence of adenopathy. Clear lungs.  No pleural effusion or pneumothorax. Skeletal structures are grossly intact. IMPRESSION: No active disease. Electronically Signed   By: Lajean Manes M.D.   On: 09/09/2019 20:58   ECHOCARDIOGRAM COMPLETE  Result Date: 09/12/2019    ECHOCARDIOGRAM REPORT   Patient Name:   PRECIOUS SEGALL Date of Exam: 09/12/2019 Medical Rec #:  [id number]      Height:       68.0 in Accession #:    [id number]     Weight:       213.4 lb Date of Birth:  28-Sep-1960     BSA:          2.101 m Patient Age:    21 years       BP:           142/88 mmHg Patient Gender: F              HR:           99 bpm. Exam Location:  Inpatient Procedure: 2D Echo Indications:    Acute Respiratory Insufficiency R06.89  History:        Patient has no prior history of Echocardiogram examinations.                 Risk Factors:Hypertension.  Sonographer:    Mikki Santee RDCS (AE) Referring Phys: [place]  1. Left ventricular ejection fraction, by estimation, is 50 to 55%. The left ventricle has low normal function. The left ventricle has no regional wall motion abnormalities. The left ventricular internal cavity size was mildly dilated. Left ventricular diastolic parameters are consistent with Grade II diastolic dysfunction (pseudonormalization).  2. Right ventricular systolic function is normal. The right ventricular size is normal. There is moderately elevated pulmonary artery systolic pressure.  3. Left atrial size was moderately dilated.  4. Right atrial size was mildly dilated.  5. The mitral valve is normal in structure. Mild mitral valve regurgitation.  6. The aortic valve is tricuspid. Aortic valve regurgitation is not visualized. No aortic stenosis is  present.  7. The inferior vena cava is dilated in size with <50% respiratory variability, suggesting right atrial pressure of 15 mmHg. Comparison(s): No prior Echocardiogram. FINDINGS  Left Ventricle: Left ventricular ejection fraction, by estimation, is 50 to 55%. The left ventricle has low normal function. The left ventricle has no regional wall motion abnormalities. The left ventricular internal cavity size was mildly dilated. There is no left ventricular hypertrophy. Left ventricular diastolic parameters are consistent with Grade II diastolic dysfunction (pseudonormalization). Right Ventricle: The right ventricular size is normal. No increase in right ventricular wall thickness. Right ventricular systolic function is normal. There is moderately elevated pulmonary artery systolic pressure. The tricuspid regurgitant velocity is 2.91 m/s, and with an assumed right atrial pressure of 15 mmHg, the estimated right ventricular systolic pressure is 67.3 mmHg. Left Atrium: Left atrial size was moderately dilated. Right Atrium: Right atrial size was mildly dilated. Pericardium: There is no evidence of pericardial effusion. Mitral Valve: The mitral valve is normal in structure. Mild to moderate mitral annular calcification. Mild mitral valve regurgitation. Tricuspid Valve: The tricuspid valve is normal in structure.  Tricuspid valve regurgitation is mild . No evidence of tricuspid stenosis. Aortic Valve: The aortic valve is tricuspid. Aortic valve regurgitation is not visualized. No aortic stenosis is present. Pulmonic Valve: The pulmonic valve was not well visualized. Pulmonic valve regurgitation is trivial. No evidence of pulmonic stenosis. Aorta: The aortic root, ascending aorta and aortic arch are all structurally normal, with no evidence of dilitation or obstruction. Venous: The inferior vena cava is dilated in size with less than 50% respiratory variability, suggesting right atrial pressure of 15 mmHg. IAS/Shunts: The  atrial septum is grossly normal. Additional Comments: A venous catheter is visualized in the right atrium.  LEFT VENTRICLE PLAX 2D LVIDd:         5.10 cm  Diastology LVIDs:         3.80 cm  LV e' medial:    6.89 cm/s LV PW:         1.10 cm  LV E/e' medial:  11.7 LV IVS:        1.00 cm  LV e' lateral:   6.24 cm/s LVOT diam:     2.20 cm  LV E/e' lateral: 12.9 LV SV:         68 LV SV Index:   33 LVOT Area:     3.80 cm  RIGHT VENTRICLE RV S prime:     14.30 cm/s TAPSE (M-mode): 2.0 cm LEFT ATRIUM             Index       RIGHT ATRIUM           Index LA diam:        3.70 cm 1.76 cm/m  RA Area:     19.30 cm LA Vol (A2C):   93.7 ml 44.60 ml/m RA Volume:   57.00 ml  27.13 ml/m LA Vol (A4C):   52.4 ml 24.94 ml/m LA Biplane Vol: 72.7 ml 34.60 ml/m  AORTIC VALVE LVOT Vmax:   88.30 cm/s LVOT Vmean:  67.000 cm/s LVOT VTI:    0.180 m  AORTA Ao Root diam: 3.10 cm MITRAL VALVE               TRICUSPID VALVE MV Area (PHT): 3.99 cm    TR Peak grad:   33.9 mmHg MV Decel Time: 190 msec    TR Vmax:        291.00 cm/s MV E velocity: 80.60 cm/s MV A velocity: 72.00 cm/s  SHUNTS MV E/A ratio:  1.12        Systemic VTI:  0.18 m                            Systemic Diam: 2.20 cm Buford Dresser MD Electronically signed by Buford Dresser MD Signature Date/Time: 09/12/2019/6:19:25 PM    Final    VAS [place] UPPER EXTREMITY VENOUS DUPLEX  Result Date: 09/13/2019 UPPER VENOUS STUDY  Indications: Swelling Risk Factors: None identified. Limitations: Poor ultrasound/tissue interface, bandages and line. Comparison Study: No prior studies. Performing Technologist: Oliver Hum RVT  Examination Guidelines: A complete evaluation includes B-mode imaging, spectral Doppler, color Doppler, and power Doppler as needed of all accessible portions of each vessel. Bilateral testing is considered an integral part of a complete examination. Limited examinations for reoccurring indications may be performed as noted.  Right Findings:  +----------+------------+---------+-----------+----------+-------+ RIGHT     CompressiblePhasicitySpontaneousPropertiesSummary +----------+------------+---------+-----------+----------+-------+ IJV           Full  Yes       Yes                      +----------+------------+---------+-----------+----------+-------+ Subclavian    Full       Yes       Yes                      +----------+------------+---------+-----------+----------+-------+ Axillary      Full       Yes       Yes                      +----------+------------+---------+-----------+----------+-------+ Brachial      Full       Yes       Yes                      +----------+------------+---------+-----------+----------+-------+ Radial        Full                                          +----------+------------+---------+-----------+----------+-------+ Ulnar         Full                                          +----------+------------+---------+-----------+----------+-------+ Cephalic      Full                                          +----------+------------+---------+-----------+----------+-------+ Basilic       Full                                          +----------+------------+---------+-----------+----------+-------+  Left Findings: +----------+------------+---------+-----------+----------+-------+ LEFT      CompressiblePhasicitySpontaneousPropertiesSummary +----------+------------+---------+-----------+----------+-------+ Subclavian    Full       Yes       Yes                      +----------+------------+---------+-----------+----------+-------+  Summary:  Right: No evidence of deep vein thrombosis in the upper extremity. No evidence of superficial vein thrombosis in the upper extremity.  Left: No evidence of thrombosis in the subclavian.  *See table(s) above for measurements and observations.  Diagnosing physician: Servando Snare MD Electronically signed by Servando Snare MD on 09/13/2019 at 9:17:22 AM.    Final    [place] EKG SITE RITE  Result Date: 09/11/2019 If Site Rite image not attached, placement could not be confirmed due to current cardiac rhythm.   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  Review the labs, review the records, discussion with the patient her findings are most consistent with a febrile neutropenia due to gram-negative bacteremia.  She has received her G-CSF shot on 09/02/2019 WBC is slowly rising. She developed bright red blood per rectum this admission.  Hemoglobin had dropped down to 5.1 and has now stabilized in the 8 range without PRBC transfusion since 09/10/2019.  Platelets are stable  at 19,000.  Last platelet transfusion was 09/14/2019. Recommend continued supportive care with transfusion to a platelet goal greater than 10 and maintaining a hemoglobin of at least 7.0.    She remains on IV antibiotics and is now afebrile. Overall her clinical course is similar to that of her prior admissions.  Fortunately this should be the last consolidation cycle and afterwards her primary oncologist is considering oral azacitidine as maintenance therapy.   #Febrile Neutropenia --Antibiotics have been narrowed to cefepime due to gram-negative bacteremia  --received neulasta on 09/02/2019, no indication for repeat GCSF --WBC up to 2.7 today and ANC is 1.3, improving --continue to monitor daily CBC with differential.  --If CBC remains stable, may be discharged home when otherwise medically stable.  I have arranged for repeat lab work and possible transfusion on 9/15 and 9/17.  #Bright Red Blood Per Rectum --please maintain Hgb goal of 7.0 with increased platelet goal of 10 --GI consulted during prior admission for similar findings. Agree that patient is not a candidate for endoscopy at this time.   --She is no longer actively bleeding. 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 Elmo Putt at [place], [place], AGPCNP-BC, AOCNP Mon/Tues/Thurs/Fri 7am-5pm; Off Wednesdays Cell: [phone]

## 2019-09-17 LAB — CBC WITH DIFFERENTIAL/PLATELET
Abs Immature Granulocytes: 0.05 10*3/uL (ref 0.00–0.07)
Basophils Absolute: 0 10*3/uL (ref 0.0–0.1)
Basophils Relative: 0 %
Eosinophils Absolute: 0 10*3/uL (ref 0.0–0.5)
Eosinophils Relative: 0 %
HCT: 23.6 % — ABNORMAL LOW (ref 36.0–46.0)
Hemoglobin: 7.5 g/dL — ABNORMAL LOW (ref 12.0–15.0)
Immature Granulocytes: 2 %
Lymphocytes Relative: 24 %
Lymphs Abs: 0.7 10*3/uL (ref 0.7–4.0)
MCH: 31.1 pg (ref 26.0–34.0)
MCHC: 31.8 g/dL (ref 30.0–36.0)
MCV: 97.9 fL (ref 80.0–100.0)
Monocytes Absolute: 0.8 10*3/uL (ref 0.1–1.0)
Monocytes Relative: 29 %
Neutro Abs: 1.2 10*3/uL — ABNORMAL LOW (ref 1.7–7.7)
Neutrophils Relative %: 45 %
Platelets: 17 10*3/uL — CL (ref 150–400)
RBC: 2.41 MIL/uL — ABNORMAL LOW (ref 3.87–5.11)
RDW: 17.5 % — ABNORMAL HIGH (ref 11.5–15.5)
WBC: 2.7 10*3/uL — ABNORMAL LOW (ref 4.0–10.5)
nRBC: 0 % (ref 0.0–0.2)

## 2019-09-17 LAB — COMPREHENSIVE METABOLIC PANEL
ALT: 16 U/L (ref 0–44)
AST: 16 U/L (ref 15–41)
Albumin: 2.6 g/dL — ABNORMAL LOW (ref 3.5–5.0)
Alkaline Phosphatase: 74 U/L (ref 38–126)
Anion gap: 9 (ref 5–15)
BUN: <5 mg/dL — ABNORMAL LOW (ref 6–20)
CO2: 27 mmol/L (ref 22–32)
Calcium: 8.7 mg/dL — ABNORMAL LOW (ref 8.9–10.3)
Chloride: 105 mmol/L (ref 98–111)
Creatinine, Ser: 0.32 mg/dL — ABNORMAL LOW (ref 0.44–1.00)
GFR calc Af Amer: >60 mL/min (ref >60)
GFR calc non Af Amer: >60 mL/min (ref >60)
Glucose, Bld: 100 mg/dL — ABNORMAL HIGH (ref 70–99)
Potassium: 3.5 mmol/L (ref 3.5–5.1)
Sodium: 141 mmol/L (ref 135–145)
Total Bilirubin: 0.9 mg/dL (ref 0.3–1.2)
Total Protein: 5 g/dL — ABNORMAL LOW (ref 6.5–8.1)

## 2019-09-17 LAB — TYPE AND SCREEN
ABO/RH(D): A POS
Antibody Screen: NEGATIVE

## 2019-09-17 MED ORDER — SODIUM CHLORIDE 0.9% IV SOLUTION: Freq: Once | INTRAVENOUS | Status: AC

## 2019-09-17 MED ORDER — FUROSEMIDE 10 MG/ML IJ SOLN
20.0000 mg | Freq: Once | INTRAMUSCULAR | Status: AC
  Administered 2019-09-17: 20 mg via INTRAVENOUS
  Filled 2019-09-17 (×2): qty 2

## 2019-09-17 NOTE — Progress Notes (Addendum)
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 Mauri Pole received G-CSF-09/02/2019  Previous admission [facility] 07/29/2019 bloody diarrhea abdominal pain-ANC 300 platelets less than 5 hemoglobin 6-blood cultures = coagulase negative staph CT abdomen pelvis = mild colitis-evaluated by GI and at that time received 4 units PRBC, 2 units platelet she was discharged 08/03/2019 to complete Cipro and Flagyl  She was admitted to [facility] 08/28/2019 for consolidation cycle 3 receiving cytarabine through PICC line and was discharged on acyclovir 400 twice daily and levofloxacin 500 for 14 days total  Admitted by critical care 9/6 febrile neutropenia T-max 100.9 hemoglobin 9.4 platelet less than 5 Developed GI bleed-hemoglobin dropped to 5-given 3 total units of platelets, transfused 2 units of blood Developed A. fib with RVR 9/9 placed on diltiazem and converted to sinus She was on Precedex while in the ICU secondary to ICU delirium and she was having visual hallucinations as of 9/10 She has improved steadily with counts improving and is being scheduled for close outpatient follow-up with oncology  Assessment & Plan:   Principal Problem:   Severe sepsis ([place]) Active Problems:   Essential hypertension   AML (acute myeloblastic leukemia) ([place])   Hypokalemia   Pancytopenia ([place])   Sepsis ([place])    1. Febrile neutropenia with gram-negative bacteremia Enterobacter cloacae 2. ?  Pneumonia in addition a. Cefepime-->Levaquin stopping on 9/19 [14 D] b. 1 view x-ray 9/8 showed questionable pneumonia-?  Atelectasis on repeat film 9/12 c. Would defer removal of PICC line to oncology-from my perspective can likely be removed prior to discharge 3. AML on cycle  3 of therapy followed at [facility] 4. Severe thrombocytopenia and anemia secondary to blood loss superimposed on  chemotherapy for her AML managed at [facility] and by [name] locally a. 2 units PRBC/3 units platelets 9/7, 2 units platelets 9/8 b. 2 further units platelets 9/11  c. Does not need PRBC transfusion threshold as above 7 d. Received Neulasta 8/30 at oncology office and expect counts to improve e. Discussed with Dr. Nicole Cella needs to be above 1.5 and platelets reliably above 20,000 for discharge readiness therefore secondary to stalling of platelets on 9/14 giving 1 more packet of platelets today f. Will start Magic mouthwash And this is improved to some degree 5. ?  ICU delirium 9/9, 9/10 a. Was on Precedex in ICU and had safety sitter which is now discontinued and this is resolved b. Continue clonazepam which is a home medication for her 6. Paroxysmal atrial fibrillation CHADS2 score >3 9/9-not anticoagulation candidate 2/2 TCP 7. EF 50-55% with moderately elevated PASP a. Loaded with amiodarone by critical care and placed on Cardizem gtt. 9/9 b. Currently Cardizem 120 CD 9/11 for tachycardia (she is in sinus) and telemetry DC'd c. Outpatient coordination of care by PCP oncology and potentially cardiology 8. Holding diuretics at this time 9. Moderate hypokalemia a. Cutting back fluid rate to 20 cc/h with 20 of K b. Replace orally with K-Lor 40 twice daily packets-cannot tolerate pill c. Repeat magnesium in a.m. 10. Acute respiratory failure earlier in admission secondary to volume overload-echocardiogram elevated right arterial pressures with diastolic dysfunction a. Still +1.9 L therefore cutting back fluids as above b. Now off oxygen since ICU stay c. Chest x-ray as above 11.  acute blood loss anemia?  Bleed/undefined cause as no scope can be performed safely with severe thrombocytopenia 12. Possibility of?  Lower GI bleed without colitis a. No further reports of  bleeding and unsafe to scope--- if recurrent bleed will need CTA b. Will need to call GI for further bleeding occurs as she is full code 13. DVT ruled out by ultrasound right upper extremity 9/10 14. Prior hepatitis B  a. Continue tenofovir   DVT prophylaxis: SCD Code Status: Full Family Communication: Discussed with patient alone on several days Disposition:   Status is: Inpatient-patient has low platelet counts in addition to electrolyte dyscrasia but is improving significantly May be able to discharge on 9/15 but need to have counts reliably above 20,000 platelets and ANC above 1.5 in addition to correction of electrolyte issues Meds have been reconciled for discharge if able to go tomorrow  Remains inpatient appropriate because:Hemodynamically unstable and IV treatments appropriate due to intensity of illness or inability to take PO   Dispo: The patient is from: Home              Anticipated d/c is to: Home              Anticipated d/c date is: 2 days              Patient currently is not medically stable to d/c.       Consultants:   Oncology  Procedures: Multiple  Antimicrobials: Cefepime transition to Levaquin on 9/13   Subjective:  Overall well no distress Eating drinking Really wants to go home Does not complain of any bleeding Mouth pain and sores are improved No chest pain or fast heartbeats or sensation of tachycardia per patient  Objective: Vitals:   09/16/19 0700 09/16/19 1420 09/16/19 2023 09/17/19 0616  BP:  139/78 (!) 155/79 (!) 144/72  Pulse:  86 90 90  Resp:  15 16 14   Temp:  98.2 F (36.8 C) 98.9 F (37.2 C) 98.8 F (37.1 C)  TempSrc:  Oral Oral Oral  SpO2:  99% 97% 92%  Weight: 90.6 kg     Height:        Intake/Output Summary (Last 24 hours) at 09/17/2019 1111 Last data filed at 09/17/2019 0759 Gross per 24 hour  Intake 818.25 ml  Output 2 ml  Net 816.25 ml   Filed Weights   09/14/19 0652 09/15/19 0430 09/16/19 0700  Weight:  93.4 kg 90 kg 90.6 kg    Examination:  General exam: Pleasant oriented No icterus no pallor Respiratory system: Clear no adventitious sounds Cardiovascular system: S1-S2 sinus rhythm no murmur Gastrointestinal system: No rebound no guarding-slight distention  Data Reviewed: I have personally reviewed following labs and imaging studies Potassium down from 3.8- 3.5 BUNs/creatinine 12/0.4--> 5/0.3 LFTs normal WBC currently 2.7 hemoglobin 7.5 ANC 1200 platelet 17  Radiology Studies: No results found. Scheduled Meds: . sodium chloride   Intravenous Once  . Chlorhexidine Gluconate Cloth  6 each Topical Daily  . diltiazem  120 mg Oral Daily  . furosemide  20 mg Intravenous Once  . levofloxacin  750 mg Oral Daily  . magic mouthwash w/lidocaine  2 mL Oral TID  . magnesium oxide  800 mg Oral BID  . multivitamin with minerals  1 tablet Oral Daily  . potassium chloride  40 mEq Oral BID  . sodium chloride flush  10-40 mL Intracatheter Q12H  . tenofovir  300 mg Oral Daily   Continuous Infusions: . lactated ringers with  kcl       LOS: 8 days    Time spent: 50  Nita Sells, MD [facility] To contact the attending provider between 7A-7P or the covering provider during after hours 7P-7A, please log into the web site [email or web address] and access using universal [place] password for that web site. If you do not have the password, please call the hospital operator.  09/17/2019, 11:11 AM

## 2019-09-18 ENCOUNTER — Inpatient Hospital Stay: Payer: Medicaid Other

## 2019-09-18 DIAGNOSIS — D696 Thrombocytopenia, unspecified: Secondary | ICD-10-CM

## 2019-09-18 DIAGNOSIS — R5081 Fever presenting with conditions classified elsewhere: Secondary | ICD-10-CM

## 2019-09-18 DIAGNOSIS — D709 Neutropenia, unspecified: Secondary | ICD-10-CM

## 2019-09-18 DIAGNOSIS — E876 Hypokalemia: Secondary | ICD-10-CM

## 2019-09-18 LAB — COMPREHENSIVE METABOLIC PANEL
ALT: 15 U/L (ref 0–44)
AST: 19 U/L (ref 15–41)
Albumin: 2.9 g/dL — ABNORMAL LOW (ref 3.5–5.0)
Alkaline Phosphatase: 85 U/L (ref 38–126)
Anion gap: 9 (ref 5–15)
BUN: <5 mg/dL — ABNORMAL LOW (ref 6–20)
CO2: 27 mmol/L (ref 22–32)
Calcium: 9 mg/dL (ref 8.9–10.3)
Chloride: 103 mmol/L (ref 98–111)
Creatinine, Ser: 0.32 mg/dL — ABNORMAL LOW (ref 0.44–1.00)
GFR calc Af Amer: >60 mL/min (ref >60)
GFR calc non Af Amer: >60 mL/min (ref >60)
Glucose, Bld: 128 mg/dL — ABNORMAL HIGH (ref 70–99)
Potassium: 3.7 mmol/L (ref 3.5–5.1)
Sodium: 139 mmol/L (ref 135–145)
Total Bilirubin: 0.6 mg/dL (ref 0.3–1.2)
Total Protein: 5.4 g/dL — ABNORMAL LOW (ref 6.5–8.1)

## 2019-09-18 LAB — CBC WITH DIFFERENTIAL/PLATELET
Abs Immature Granulocytes: 0.11 10*3/uL — ABNORMAL HIGH (ref 0.00–0.07)
Basophils Absolute: 0 10*3/uL (ref 0.0–0.1)
Basophils Relative: 0 %
Eosinophils Absolute: 0 10*3/uL (ref 0.0–0.5)
Eosinophils Relative: 0 %
HCT: 24.7 % — ABNORMAL LOW (ref 36.0–46.0)
Hemoglobin: 7.8 g/dL — ABNORMAL LOW (ref 12.0–15.0)
Immature Granulocytes: 4 %
Lymphocytes Relative: 21 %
Lymphs Abs: 0.6 10*3/uL — ABNORMAL LOW (ref 0.7–4.0)
MCH: 31 pg (ref 26.0–34.0)
MCHC: 31.6 g/dL (ref 30.0–36.0)
MCV: 98 fL (ref 80.0–100.0)
Monocytes Absolute: 0.9 10*3/uL (ref 0.1–1.0)
Monocytes Relative: 29 %
Neutro Abs: 1.4 10*3/uL — ABNORMAL LOW (ref 1.7–7.7)
Neutrophils Relative %: 46 %
Platelets: 46 10*3/uL — ABNORMAL LOW (ref 150–400)
RBC: 2.52 MIL/uL — ABNORMAL LOW (ref 3.87–5.11)
RDW: 17.3 % — ABNORMAL HIGH (ref 11.5–15.5)
WBC: 3.1 10*3/uL — ABNORMAL LOW (ref 4.0–10.5)
nRBC: 0.7 % — ABNORMAL HIGH (ref 0.0–0.2)

## 2019-09-18 LAB — BPAM PLATELET PHERESIS
Blood Product Expiration Date: 202109162359
ISSUE DATE / TIME: 202109141231
Unit Type and Rh: 6200

## 2019-09-18 LAB — PREPARE PLATELET PHERESIS: Unit division: 0

## 2019-09-18 LAB — MAGNESIUM: Magnesium: 2 mg/dL (ref 1.7–2.4)

## 2019-09-18 MED ORDER — DILTIAZEM HCL ER COATED BEADS 120 MG PO CP24: 120.0000 mg | ORAL_CAPSULE | Freq: Every day | ORAL | 2 refills | Status: DC

## 2019-09-18 MED ORDER — POTASSIUM CHLORIDE 20 MEQ PO PACK
40.0000 meq | PACK | Freq: Every day | ORAL | 0 refills | Status: DC
Start: 2019-09-18 — End: 2019-12-02

## 2019-09-18 MED ORDER — GABAPENTIN 300 MG PO CAPS: 900.0000 mg | ORAL_CAPSULE | ORAL | 0 refills | Status: DC

## 2019-09-18 MED ORDER — CLONAZEPAM 0.5 MG PO TABS: 0.5000 mg | ORAL_TABLET | Freq: Two times a day (BID) | ORAL | 0 refills | Status: DC | PRN

## 2019-09-18 MED ORDER — HYDROCHLOROTHIAZIDE 25 MG PO TABS: 25.0000 mg | ORAL_TABLET | Freq: Every day | ORAL | 2 refills | Status: DC

## 2019-09-18 MED ORDER — MAGIC MOUTHWASH W/LIDOCAINE: 5.0000 mL | Freq: Three times a day (TID) | ORAL | 0 refills | Status: AC

## 2019-09-18 MED ORDER — PANTOPRAZOLE SODIUM 40 MG PO TBEC: 40.0000 mg | DELAYED_RELEASE_TABLET | Freq: Every day | ORAL | Status: DC

## 2019-09-18 MED ORDER — MAGNESIUM OXIDE 400 (241.3 MG) MG PO TABS
800.0000 mg | ORAL_TABLET | Freq: Two times a day (BID) | ORAL | 0 refills | Status: DC
Start: 2019-09-18 — End: 2020-01-09

## 2019-09-18 MED ORDER — HYDROCHLOROTHIAZIDE 25 MG PO TABS
25.0000 mg | ORAL_TABLET | Freq: Every day | ORAL | Status: DC
  Administered 2019-09-18: 25 mg via ORAL
  Filled 2019-09-18: qty 1

## 2019-09-18 MED ORDER — TRAMADOL HCL 50 MG PO TABS
50.0000 mg | ORAL_TABLET | Freq: Three times a day (TID) | ORAL | 0 refills | Status: DC | PRN
Start: 2019-09-18 — End: 2019-12-02

## 2019-09-18 MED ORDER — LEVOFLOXACIN 750 MG PO TABS: 750.0000 mg | ORAL_TABLET | Freq: Every day | ORAL | 0 refills | Status: AC

## 2019-09-18 MED ORDER — ONDANSETRON HCL 4 MG PO TABS: 4.0000 mg | ORAL_TABLET | Freq: Four times a day (QID) | ORAL | 0 refills | Status: DC | PRN

## 2019-09-18 MED ORDER — OMEPRAZOLE 20 MG PO CPDR: 20.0000 mg | DELAYED_RELEASE_CAPSULE | Freq: Every day | ORAL | 2 refills | Status: DC

## 2019-09-18 NOTE — Discharge Summary (Signed)
Physician Discharge Summary  Kelly Roberson MVE:[id number] DOB: 1960/02/16 DOA: 09/09/2019  PCP: Ladell Pier, MD  Admit date: 09/09/2019 Discharge date: 09/18/2019  Time spent: 60 minutes  Recommendations for Outpatient Follow-up:  1. Follow-up with PCP in 1 to 2 weeks.  On follow-up patient need a basic metabolic profile done to follow-up on electrolytes and renal function. 2. Follow-up with [name] as scheduled 09/20/2019.  CBC with differential, basic metabolic profile need to be obtained to follow-up on electrolytes and renal function as well as counts.   Discharge Diagnoses:  Principal Problem:   Severe sepsis ([place]) Active Problems:   Essential hypertension   AML (acute myeloblastic leukemia) ([place])   Hypokalemia   Pancytopenia ([place])   Sepsis ([place])   Discharge Condition: Stable and improved  Diet recommendation: Regular  Filed Weights   09/15/19 0430 09/16/19 0700 09/17/19 2017  Weight: 90 kg 90.6 kg 90.1 kg    History of present illness:  HPI per Dr. Penelope Galas is a 59 y.o. 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  Patient stated her last chemo was on August 31.  Since yesterday she is having fevers and chills.  She was coughing a little.  Denied shortness of breath or chest pain.  Reported having green-colored diarrhea and generalized abdominal pain since yesterday.  She is feeling nauseous but has not vomited.  She is also having headaches.  No additional history could be obtained from her at this time.   ED Course: Temperature 103.1 F.  Tachycardic and tachypneic.  Blood pressure elevated with systolic up to 283M.  Not hypoxic.  WBC <0.01, hemoglobin 9.4, hematocrit 28.4, platelet <5.  Sodium 135, potassium 3.0, chloride 102, bicarb 23, BUN 14, creatinine 0.5, glucose 148.  AST 50, ALT 39, alk  phos 86, T bili 1.5.  Lactic acid 2.1.  INR 1.1.  SARS-CoV-2 PCR test negative.  UA and urine culture pending.  Blood culture x2 pending.  Chest x-ray not suggestive of pneumonia.  CT abdomen pelvis without infectious source.  Patient received 1 L LR bolus, vancomycin, cefepime, and platelet transfusion.  Hospital Course:  #1 febrile neutropenia with gram-negative bacteremia Enterobacter cloacae/pneumonia Patient had presented with fever and chills, work-up was positive for Enterobacter CLO clear bacteremia.  Patient placed empirically on IV cefepime.  Plain films done on 09/11/2019 were concerning for atelectasis versus pneumonia.  Patient maintained on IV cefepime.  Patient improved clinically.  Patient remained afebrile for greater than 24 to 48 hours.  Patient subsequently transition to oral Levaquin will be discharged home to complete a course of Levaquin on 09/22/2019.  Patient was discharged in stable and improved condition.  Outpatient follow-up.  2.  Severe thrombocytopenia/anemia secondary to acute blood loss superimposed on chemotherapy for AML.  Patient being managed at [facility] for AML and seen locally by [name].  Patient during the hospitalization due to anemia and thrombocytopenia was transfused a total of 2 units packed red blood cells, total of 7 units of platelets.  Patient also noted to have received Neulasta on 09/02/2019 and oncology office.  Patient was seen by [name] during the hospitalization and followed.  Patient improved clinically.  Patient's ANC was 1.4 by day of discharge and platelet count had improved to 46,000 by day of discharge.  Patient will follow up with oncology on 09/20/2019 for repeat labs and  hospital follow-up.  Patient was discharged in stable and improved condition.  3.  AML on cycle 3 Being followed at [facility] and locally by [name].  Outpatient follow-up.  4.  Paroxysmal atrial fibrillation CHA2DS2VASC score > 3. Patient noted initially early on  during the hospitalization to be loaded with amiodarone by critical care and subsequently placed on a Cardizem drip for A. fib with RVR on 09/12/2019.  Patient subsequently transition to Cardizem CD 120 mg daily 09/14/2019 for tachycardia.  Patient was in sinus rhythm.  Remained rate controlled during the hospitalization.  Patient deemed not a anticoagulation candidate secondary to severe thrombocytopenia.  Outpatient follow-up.  5.  Hypokalemia Repleted during the hospitalization.  Outpatient follow-up.  6.  Acute respiratory failure early on in the hospitalization Felt likely secondary to volume overload.  2D echo which was done had a EF of 50 to 55% with moderately elevated PASP, elevated right atrial pressures with diastolic dysfunction.  IV fluids were decreased.  Patient improved clinically and was on room air by day of discharge with sats ranging from 94 to 97%.  7.  Prior hepatitis B Patient maintained on home regimen of tenofovir.  Procedures:  CT abdomen and pelvis 09/09/2019  CT head 09/10/2019  Chest x-ray 09/09/2019, 09/11/2019, 09/15/2019  2D echo 09/12/2019  Upper extremity Doppler 09/13/2019    Consultations:  Oncology: [name] 09/10/2019  Discharge Exam: Vitals:   09/18/19 0500 09/18/19 1056  BP: (!) 145/79 135/74  Pulse: 92 76  Resp: 18   Temp: 97.6 F (36.4 C)   SpO2: 92%     General: NAD Cardiovascular: RRR Respiratory: CTAB  Discharge Instructions   Discharge Instructions    Diet general   Complete by: As directed    Increase activity slowly   Complete by: As directed      Allergies as of 09/18/2019      Reactions   Ace Inhibitors    Other reaction(s): Cough   Lisinopril Cough   Tylenol [acetaminophen] Hives, Itching   Pt. States she only itches from tylenol       Medication List    STOP taking these medications   LORazepam 1 MG tablet Commonly known as: ATIVAN   losartan 25 MG tablet Commonly known as: Cozaar   metroNIDAZOLE 500 MG  tablet Commonly known as: FLAGYL     TAKE these medications   acyclovir 400 MG tablet Commonly known as: ZOVIRAX Take 400 mg by mouth 2 (two) times daily.   Cholecalciferol 50 MCG (2000 UT) Tabs Take 2,000 Units by mouth daily.   clonazePAM 0.5 MG tablet Commonly known as: KLONOPIN Take 1 tablet (0.5 mg total) by mouth 2 (two) times daily as needed for anxiety.   cyclobenzaprine 5 MG tablet Commonly known as: FLEXERIL Take 1 tablet (5 mg total) by mouth 3 (three) times daily as needed for muscle spasms.   diltiazem 120 MG 24 hr capsule Commonly known as: CARDIZEM CD Take 1 capsule (120 mg total) by mouth daily. Start taking on: September 19, 2019   entecavir 0.5 MG tablet Commonly known as: BARACLUDE Take 0.5 mg by mouth daily.   folic acid 1 MG tablet Commonly known as: FOLVITE Take 1 mg by mouth daily.   gabapentin 300 MG capsule Commonly known as: NEURONTIN Take 3-4 capsules (900-1,200 mg total) by mouth See admin instructions. 943m with breakfast and lunch 12014mat night What changed: Another medication with the same name was added. Make sure you understand how and when  to take each.   gabapentin 300 MG capsule Commonly known as: NEURONTIN Take 3-4 capsules (900-1,200 mg total) by mouth See admin instructions. Take 932m twice daily and 12023mat bedtime. What changed: You were already taking a medication with the same name, and this prescription was added. Make sure you understand how and when to take each.   hydrochlorothiazide 25 MG tablet Commonly known as: HYDRODIURIL Take 1 tablet (25 mg total) by mouth daily.   levofloxacin 750 MG tablet Commonly known as: LEVAQUIN Take 1 tablet (750 mg total) by mouth daily for 4 days. Please fill sufficient quantity to treat through 9/19. What changed:   medication strength  how much to take  additional instructions   loperamide 2 MG capsule Commonly known as: IMODIUM Take 2 mg by mouth as needed for diarrhea  or loose stools.   magic mouthwash w/lidocaine Soln Take 5 mLs by mouth 3 (three) times daily for 5 days.   magnesium oxide 400 (241.3 Mg) MG tablet Commonly known as: MAG-OX Take 2 tablets (800 mg total) by mouth 2 (two) times daily.   multivitamin with minerals Tabs tablet Take 1 tablet by mouth daily.   omeprazole 20 MG capsule Commonly known as: PRILOSEC Take 1 capsule (20 mg total) by mouth daily.   ondansetron 4 MG tablet Commonly known as: ZOFRAN Take 1 tablet (4 mg total) by mouth every 6 (six) hours as needed for nausea.   polyethylene glycol 17 g packet Commonly known as: MIRALAX / GLYCOLAX Take 17 g by mouth daily as needed for moderate constipation or severe constipation.   potassium chloride 20 MEQ packet Commonly known as: KLOR-CON Take 40 mEq by mouth daily.   senna-docusate 8.6-50 MG tablet Commonly known as: Senokot-S Take 1 tablet by mouth at bedtime.   traMADol 50 MG tablet Commonly known as: ULTRAM Take 1 tablet (50 mg total) by mouth every 8 (eight) hours as needed (mild pain).   traZODone 50 MG tablet Commonly known as: DESYREL Take 50 mg by mouth at bedtime as needed for sleep.      Allergies  Allergen Reactions  . Ace Inhibitors     Other reaction(s): Cough  . Lisinopril Cough  . Tylenol [Acetaminophen] Hives and Itching    Pt. States she only itches from tylenol     Follow-up Information    JoLadell PierMD. Schedule an appointment as soon as possible for a visit in 2 week(s).   Specialty: Internal Medicine Why: f/u in 1-2 weeks. Contact information: [phone]      DoOrson SlickMD Follow up on 09/20/2019.   Specialty: Hematology and Oncology Why: f/u as scheduled. Contact information: [phone]              The results of significant diagnostics from this hospitalization (including imaging, microbiology, ancillary  and laboratory) are listed below for reference.    Significant Diagnostic Studies: DG Chest 1 View  Result Date: 09/11/2019 CLINICAL DATA:  Hypoxemia EXAM: CHEST  1 VIEW COMPARISON:  09/09/2019 FINDINGS: Right upper extremity central venous catheter tip over the SVC. Streaky atelectasis right base. Interim development of patchy airspace disease in the left mid and lower lung. Stable cardiomediastinal silhouette. No pneumothorax. IMPRESSION: Interim development of patchy airspace disease in the left mid and lower lung, possible pneumonia. Electronically Signed   By: KiDonavan Foil.D.   On: 09/11/2019  18:57   DG Chest 2 View  Result Date: 09/15/2019 CLINICAL DATA:  Pneumonia shortness of breath, leukemia history of hypertension. EXAM: CHEST - 2 VIEW COMPARISON:  09/11/2019 FINDINGS: RIGHT-sided PICC line terminates at the caval to atrial junction. Trachea midline. Cardiomediastinal contours and hilar structures are normal. Elevation of the RIGHT hemidiaphragm as before with airspace disease overlying the RIGHT hemidiaphragm and mild blunting of RIGHT costodiaphragmatic sulcus suggesting small effusion. On limited assessment skeletal structures without acute process. IMPRESSION: RIGHT basilar airspace disease and possible small RIGHT effusion. Airspace process may represent developing infection or juxta diaphragmatic atelectasis in the setting of elevated RIGHT hemidiaphragm. Interval improvement with respect aeration at the LEFT lung base. Electronically Signed   By: Zetta Bills M.D.   On: 09/15/2019 13:01   CT Head Wo Contrast  Result Date: 09/10/2019 CLINICAL DATA:  Headache. Tremor and fever starting yesterday. History of leukemia. Chemotherapy last Wednesday. Nose surgery on Friday. EXAM: CT HEAD WITHOUT CONTRAST TECHNIQUE: Contiguous axial images were obtained from the base of the skull through the vertex without intravenous contrast. COMPARISON:  07/01/2019 FINDINGS: Brain: Mild cerebral atrophy.  No ventricular dilatation. Patchy low-attenuation changes in the deep white matter consistent with small vessel ischemic changes and lacunar infarcts. No mass effect or midline shift. No abnormal extra-axial fluid collections. Gray-white matter junctions are distinct. Basal cisterns are not effaced. No acute intracranial hemorrhage. Vascular: Intracranial arterial vascular calcifications are present. Skull: The calvarium appears intact. Sinuses/Orbits: Paranasal sinuses and mastoid air cells are clear. Other: None. IMPRESSION: 1. No acute intracranial abnormalities. 2. Chronic atrophy and small vessel ischemic changes. Electronically Signed   By: Lucienne Capers M.D.   On: 09/10/2019 01:05   CT ABDOMEN PELVIS W CONTRAST  Result Date: 09/09/2019 CLINICAL DATA:  Leukemia.  Recent chemotherapy.  Fever. EXAM: CT ABDOMEN AND PELVIS WITH CONTRAST TECHNIQUE: Multidetector CT imaging of the abdomen and pelvis was performed using the standard protocol following bolus administration of intravenous contrast. CONTRAST:  173m OMNIPAQUE IOHEXOL 300 MG/ML  SOLN COMPARISON:  07/29/2019 FINDINGS: Lower chest: Linear scarring or atelectasis in the right lung base. No effusions or acute abnormality. Hepatobiliary: Low-density lesion posteriorly in the liver is stable since prior study measuring 1.7 cm. This is likely hemangioma. No suspicious Vac abnormality. Gallbladder unremarkable. Pancreas: No focal abnormality or ductal dilatation. Spleen: No focal abnormality.  Normal size. Adrenals/Urinary Tract: No adrenal abnormality. No focal renal abnormality. No stones or hydronephrosis. Urinary bladder is unremarkable. Stomach/Bowel: There is fatty proliferation within the right colonic wall, likely related to prior inflammatory process/infection. No evidence for active enteritis/colitis. Prior gastric bypass. Vascular/Lymphatic: No evidence of aneurysm or adenopathy. Scattered aortic calcifications. Reproductive: Uterus and adnexa  unremarkable.  No mass. Other: No free fluid or free air. Musculoskeletal: No acute bony abnormality. IMPRESSION: No acute findings in the abdomen or pelvis. Electronically Signed   By: KRolm BaptiseM.D.   On: 09/09/2019 22:12   DG Chest Port 1 View  Result Date: 09/09/2019 CLINICAL DATA:  Leukemia patient.  Fever.  Questionable sepsis. EXAM: PORTABLE CHEST 1 VIEW COMPARISON:  07/29/2019 FINDINGS: Cardiac silhouette is normal in size. No mediastinal or hilar masses or evidence of adenopathy. Clear lungs.  No pleural effusion or pneumothorax. Skeletal structures are grossly intact. IMPRESSION: No active disease. Electronically Signed   By: DLajean ManesM.D.   On: 09/09/2019 20:58   ECHOCARDIOGRAM COMPLETE  Result Date: 09/12/2019    ECHOCARDIOGRAM REPORT   Patient Name:   TABBYGAELLEDFORD Date of  Exam: 09/12/2019 Medical Rec #:  [id number]      Height:       68.0 in Accession #:    [id number]     Weight:       213.4 lb Date of Birth:  22-Nov-1960     BSA:          2.101 m Patient Age:    65 years       BP:           142/88 mmHg Patient Gender: F              HR:           99 bpm. Exam Location:  Inpatient Procedure: 2D Echo Indications:    Acute Respiratory Insufficiency R06.89  History:        Patient has no prior history of Echocardiogram examinations.                 Risk Factors:Hypertension.  Sonographer:    Mikki Santee RDCS (AE) Referring Phys: [place]  1. Left ventricular ejection fraction, by estimation, is 50 to 55%. The left ventricle has low normal function. The left ventricle has no regional wall motion abnormalities. The left ventricular internal cavity size was mildly dilated. Left ventricular diastolic parameters are consistent with Grade II diastolic dysfunction (pseudonormalization).  2. Right ventricular systolic function is normal. The right ventricular size is normal. There is moderately elevated pulmonary artery systolic pressure.  3. Left atrial size was  moderately dilated.  4. Right atrial size was mildly dilated.  5. The mitral valve is normal in structure. Mild mitral valve regurgitation.  6. The aortic valve is tricuspid. Aortic valve regurgitation is not visualized. No aortic stenosis is present.  7. The inferior vena cava is dilated in size with <50% respiratory variability, suggesting right atrial pressure of 15 mmHg. Comparison(s): No prior Echocardiogram. FINDINGS  Left Ventricle: Left ventricular ejection fraction, by estimation, is 50 to 55%. The left ventricle has low normal function. The left ventricle has no regional wall motion abnormalities. The left ventricular internal cavity size was mildly dilated. There is no left ventricular hypertrophy. Left ventricular diastolic parameters are consistent with Grade II diastolic dysfunction (pseudonormalization). Right Ventricle: The right ventricular size is normal. No increase in right ventricular wall thickness. Right ventricular systolic function is normal. There is moderately elevated pulmonary artery systolic pressure. The tricuspid regurgitant velocity is 2.91 m/s, and with an assumed right atrial pressure of 15 mmHg, the estimated right ventricular systolic pressure is 11.6 mmHg. Left Atrium: Left atrial size was moderately dilated. Right Atrium: Right atrial size was mildly dilated. Pericardium: There is no evidence of pericardial effusion. Mitral Valve: The mitral valve is normal in structure. Mild to moderate mitral annular calcification. Mild mitral valve regurgitation. Tricuspid Valve: The tricuspid valve is normal in structure. Tricuspid valve regurgitation is mild . No evidence of tricuspid stenosis. Aortic Valve: The aortic valve is tricuspid. Aortic valve regurgitation is not visualized. No aortic stenosis is present. Pulmonic Valve: The pulmonic valve was not well visualized. Pulmonic valve regurgitation is trivial. No evidence of pulmonic stenosis. Aorta: The aortic root, ascending aorta and  aortic arch are all structurally normal, with no evidence of dilitation or obstruction. Venous: The inferior vena cava is dilated in size with less than 50% respiratory variability, suggesting right atrial pressure of 15 mmHg. IAS/Shunts: The atrial septum is grossly normal. Additional Comments: A venous catheter is visualized in the right atrium.  LEFT VENTRICLE PLAX 2D LVIDd:         5.10 cm  Diastology LVIDs:         3.80 cm  LV e' medial:    6.89 cm/s LV PW:         1.10 cm  LV E/e' medial:  11.7 LV IVS:        1.00 cm  LV e' lateral:   6.24 cm/s LVOT diam:     2.20 cm  LV E/e' lateral: 12.9 LV SV:         68 LV SV Index:   33 LVOT Area:     3.80 cm  RIGHT VENTRICLE RV S prime:     14.30 cm/s TAPSE (M-mode): 2.0 cm LEFT ATRIUM             Index       RIGHT ATRIUM           Index LA diam:        3.70 cm 1.76 cm/m  RA Area:     19.30 cm LA Vol (A2C):   93.7 ml 44.60 ml/m RA Volume:   57.00 ml  27.13 ml/m LA Vol (A4C):   52.4 ml 24.94 ml/m LA Biplane Vol: 72.7 ml 34.60 ml/m  AORTIC VALVE LVOT Vmax:   88.30 cm/s LVOT Vmean:  67.000 cm/s LVOT VTI:    0.180 m  AORTA Ao Root diam: 3.10 cm MITRAL VALVE               TRICUSPID VALVE MV Area (PHT): 3.99 cm    TR Peak grad:   33.9 mmHg MV Decel Time: 190 msec    TR Vmax:        291.00 cm/s MV E velocity: 80.60 cm/s MV A velocity: 72.00 cm/s  SHUNTS MV E/A ratio:  1.12        Systemic VTI:  0.18 m                            Systemic Diam: 2.20 cm Buford Dresser MD Electronically signed by Buford Dresser MD Signature Date/Time: 09/12/2019/6:19:25 PM    Final    VAS [place] UPPER EXTREMITY VENOUS DUPLEX  Result Date: 09/13/2019 UPPER VENOUS STUDY  Indications: Swelling Risk Factors: None identified. Limitations: Poor ultrasound/tissue interface, bandages and line. Comparison Study: No prior studies. Performing Technologist: Oliver Hum RVT  Examination Guidelines: A complete evaluation includes B-mode imaging, spectral Doppler, color Doppler, and power  Doppler as needed of all accessible portions of each vessel. Bilateral testing is considered an integral part of a complete examination. Limited examinations for reoccurring indications may be performed as noted.  Right Findings: +----------+------------+---------+-----------+----------+-------+ RIGHT     CompressiblePhasicitySpontaneousPropertiesSummary +----------+------------+---------+-----------+----------+-------+ IJV           Full       Yes       Yes                      +----------+------------+---------+-----------+----------+-------+ Subclavian    Full       Yes       Yes                      +----------+------------+---------+-----------+----------+-------+ Axillary      Full       Yes       Yes                      +----------+------------+---------+-----------+----------+-------+  Brachial      Full       Yes       Yes                      +----------+------------+---------+-----------+----------+-------+ Radial        Full                                          +----------+------------+---------+-----------+----------+-------+ Ulnar         Full                                          +----------+------------+---------+-----------+----------+-------+ Cephalic      Full                                          +----------+------------+---------+-----------+----------+-------+ Basilic       Full                                          +----------+------------+---------+-----------+----------+-------+  Left Findings: +----------+------------+---------+-----------+----------+-------+ LEFT      CompressiblePhasicitySpontaneousPropertiesSummary +----------+------------+---------+-----------+----------+-------+ Subclavian    Full       Yes       Yes                      +----------+------------+---------+-----------+----------+-------+  Summary:  Right: No evidence of deep vein thrombosis in the upper extremity. No evidence of  superficial vein thrombosis in the upper extremity.  Left: No evidence of thrombosis in the subclavian.  *See table(s) above for measurements and observations.  Diagnosing physician: Servando Snare MD Electronically signed by Servando Snare MD on 09/13/2019 at 9:17:22 AM.    Final    [place] EKG SITE RITE  Result Date: 09/11/2019 If Site Rite image not attached, placement could not be confirmed due to current cardiac rhythm.   Microbiology: Recent Results (from the past 240 hour(s))  SARS Coronavirus 2 by RT PCR (hospital order, performed in [facility] hospital lab) Nasopharyngeal Nasopharyngeal Swab     Status: None   Collection Time: 09/09/19  9:22 PM   Specimen: Nasopharyngeal Swab  Result Value Ref Range Status   SARS Coronavirus 2 NEGATIVE NEGATIVE Final    Comment: (NOTE) SARS-CoV-2 target nucleic acids are NOT DETECTED.  The SARS-CoV-2 RNA is generally detectable in upper and lower respiratory specimens during the acute phase of infection. The lowest concentration of SARS-CoV-2 viral copies this assay can detect is 250 copies / mL. A negative result does not preclude SARS-CoV-2 infection and should not be used as the sole basis for treatment or other patient management decisions.  A negative result may occur with improper specimen collection / handling, submission of specimen other than nasopharyngeal swab, presence of viral mutation(s) within the areas targeted by this assay, and inadequate number of viral copies (<250 copies / mL). A negative result must be combined with clinical observations, patient history, and epidemiological information.  Fact Sheet for Patients:   StrictlyIdeas.no  Fact Sheet for Healthcare Providers: BankingDealers.co.za  This test is not yet approved or  cleared by  the [employer] and has been authorized for detection and/or diagnosis of SARS-CoV-2 by FDA under an Emergency Use Authorization (EUA).  This  EUA will remain in effect (meaning this test can be used) for the duration of the COVID-19 declaration under Section 564(b)(1) of the Act, 21 U.S.C. section 360bbb-3(b)(1), unless the authorization is terminated or revoked sooner.  Performed at [facility], [place] [address], [place], [place] [address]   Blood culture (routine x 2)     Status: Abnormal   Collection Time: 09/09/19  9:22 PM   Specimen: BLOOD  Result Value Ref Range Status   Specimen Description   Final    BLOOD LEFT ARM Performed at [facility] [address] [place], [place] [address]    Special Requests   Final    BOTTLES DRAWN AEROBIC AND ANAEROBIC Blood Culture adequate volume Performed at [facility] [address] [place], [place] [address]    Culture  Setup Time   Final    GRAM NEGATIVE RODS IN BOTH AEROBIC AND ANAEROBIC BOTTLES CRITICAL RESULT CALLED TO, READ BACK BY AND VERIFIED WITH: PHARM D J.GADHIA AT 0973 ON 09/10/2019 BY CM Performed at [facility], [place] [address], [place], [place] [address]    Culture ENTEROBACTER CLOACAE (A)  Final   Report Status 09/12/2019 FINAL  Final   Organism ID, Bacteria ENTEROBACTER CLOACAE  Final      Susceptibility   Enterobacter cloacae - MIC*    CEFAZOLIN >=64 RESISTANT Resistant     CEFEPIME <=0.12 SENSITIVE Sensitive     CEFTAZIDIME <=1 SENSITIVE Sensitive     CIPROFLOXACIN <=0.25 SENSITIVE Sensitive     GENTAMICIN <=1 SENSITIVE Sensitive     IMIPENEM 1 SENSITIVE Sensitive     TRIMETH/SULFA <=20 SENSITIVE Sensitive     PIP/TAZO <=4 SENSITIVE Sensitive     * ENTEROBACTER CLOACAE  Blood culture (routine x 2)     Status: Abnormal   Collection Time: 09/09/19  9:22 PM   Specimen: BLOOD  Result Value Ref Range Status   Specimen Description   Final    BLOOD LEFT HAND Performed at [place] [address], [place], [place] [address]    Special Requests   Final    BOTTLES  DRAWN AEROBIC AND ANAEROBIC Blood Culture adequate volume Performed at [place] [address] [place], [place] [address]    Culture  Setup Time   Final    GRAM NEGATIVE RODS IN BOTH AEROBIC AND ANAEROBIC BOTTLES CRITICAL VALUE NOTED.  VALUE IS CONSISTENT WITH PREVIOUSLY REPORTED AND CALLED VALUE.    Culture (A)  Final    ENTEROBACTER CLOACAE SUSCEPTIBILITIES PERFORMED ON PREVIOUS CULTURE WITHIN THE LAST 5 DAYS. Performed at [facility], [place] [address] [place], [place] [address]    Report Status 09/12/2019 FINAL  Final  Blood Culture ID Panel (Reflexed)     Status: Abnormal   Collection Time: 09/09/19  9:22 PM  Result Value Ref Range Status   Enterococcus faecalis NOT DETECTED NOT DETECTED Final   Enterococcus Faecium NOT DETECTED NOT DETECTED Final   Listeria monocytogenes NOT DETECTED NOT DETECTED Final   Staphylococcus species NOT DETECTED NOT DETECTED Final   Staphylococcus aureus (BCID) NOT DETECTED NOT DETECTED Final   Staphylococcus epidermidis NOT DETECTED NOT DETECTED Final   Staphylococcus lugdunensis NOT DETECTED NOT DETECTED Final   Streptococcus species NOT DETECTED NOT DETECTED Final   Streptococcus agalactiae NOT  DETECTED NOT DETECTED Final   Streptococcus pneumoniae NOT DETECTED NOT DETECTED Final   Streptococcus pyogenes NOT DETECTED NOT DETECTED Final   A.calcoaceticus-baumannii NOT DETECTED NOT DETECTED Final   Bacteroides fragilis NOT DETECTED NOT DETECTED Final   Enterobacterales DETECTED (A) NOT DETECTED Final    Comment: Enterobacterales represent a large order of gram negative bacteria, not a single organism. CRITICAL RESULT CALLED TO, READ BACK BY AND VERIFIED WITH: PHARMD J GADHIA [id number] AT 5188 BY CM    Enterobacter cloacae complex DETECTED (A) NOT DETECTED Final    Comment: CRITICAL RESULT CALLED TO, READ BACK BY AND VERIFIED WITH: PHARMD J GADHIA [id number] AT 0405 BY CM    Escherichia coli NOT DETECTED NOT DETECTED  Final   Klebsiella aerogenes NOT DETECTED NOT DETECTED Final   Klebsiella oxytoca NOT DETECTED NOT DETECTED Final   Klebsiella pneumoniae NOT DETECTED NOT DETECTED Final   Proteus species NOT DETECTED NOT DETECTED Final   Salmonella species NOT DETECTED NOT DETECTED Final   Serratia marcescens NOT DETECTED NOT DETECTED Final   Haemophilus influenzae NOT DETECTED NOT DETECTED Final   Neisseria meningitidis NOT DETECTED NOT DETECTED Final   Pseudomonas aeruginosa NOT DETECTED NOT DETECTED Final   Stenotrophomonas maltophilia NOT DETECTED NOT DETECTED Final   Candida albicans NOT DETECTED NOT DETECTED Final   Candida auris NOT DETECTED NOT DETECTED Final   Candida glabrata NOT DETECTED NOT DETECTED Final   Candida krusei NOT DETECTED NOT DETECTED Final   Candida parapsilosis NOT DETECTED NOT DETECTED Final   Candida tropicalis NOT DETECTED NOT DETECTED Final   Cryptococcus neoformans/gattii NOT DETECTED NOT DETECTED Final   CTX-M ESBL NOT DETECTED NOT DETECTED Final   Carbapenem resistance IMP NOT DETECTED NOT DETECTED Final   Carbapenem resistance KPC NOT DETECTED NOT DETECTED Final   Carbapenem resistance NDM NOT DETECTED NOT DETECTED Final   Carbapenem resist OXA 48 LIKE NOT DETECTED NOT DETECTED Final   Carbapenem resistance VIM NOT DETECTED NOT DETECTED Final    Comment: Performed at [facility], [address], [place], [place] [address]  C Difficile Quick Screen w PCR reflex     Status: None   Collection Time: 09/10/19 12:41 AM   Specimen: STOOL  Result Value Ref Range Status   C Diff antigen NEGATIVE NEGATIVE Final   C Diff toxin NEGATIVE NEGATIVE Final   C Diff interpretation No C. difficile detected.  Final    Comment: Performed at [facility], [place] [address] [place], [place] [address]  Gastrointestinal Panel by PCR , Stool     Status: None   Collection Time: 09/10/19 12:41 AM   Specimen: STOOL  Result Value Ref Range Status   Campylobacter  species NOT DETECTED NOT DETECTED Final   Plesimonas shigelloides NOT DETECTED NOT DETECTED Final   Salmonella species NOT DETECTED NOT DETECTED Final   Yersinia enterocolitica NOT DETECTED NOT DETECTED Final   Vibrio species NOT DETECTED NOT DETECTED Final   Vibrio cholerae NOT DETECTED NOT DETECTED Final   Enteroaggregative E coli (EAEC) NOT DETECTED NOT DETECTED Final   Enteropathogenic E coli (EPEC) NOT DETECTED NOT DETECTED Final   Enterotoxigenic E coli (ETEC) NOT DETECTED NOT DETECTED Final   Shiga like toxin producing E coli (STEC) NOT DETECTED NOT DETECTED Final   Shigella/Enteroinvasive E coli (EIEC) NOT DETECTED NOT DETECTED Final   Cryptosporidium NOT DETECTED NOT DETECTED Final   Cyclospora cayetanensis NOT DETECTED NOT DETECTED Final   Entamoeba histolytica NOT DETECTED NOT DETECTED  Final   Giardia lamblia NOT DETECTED NOT DETECTED Final   Adenovirus F40/41 NOT DETECTED NOT DETECTED Final   Astrovirus NOT DETECTED NOT DETECTED Final   Norovirus GI/GII NOT DETECTED NOT DETECTED Final   Rotavirus A NOT DETECTED NOT DETECTED Final   Sapovirus (I, II, IV, and V) NOT DETECTED NOT DETECTED Final    Comment: Performed at [facility], [address], [place], [place] [address]  Urine culture     Status: None   Collection Time: 09/10/19 12:02 PM   Specimen: In/Out Cath Urine  Result Value Ref Range Status   Specimen Description   Final    IN/OUT CATH URINE Performed at [facility], [place] [address], [place], [place] [address]    Special Requests   Final    NONE Performed at [facility], [place] [address] [place], [place] [address]    Culture   Final    NO GROWTH Performed at [facility], [place] [address], [place], [place] [address]    Report Status 09/11/2019 FINAL  Final  MRSA PCR Screening     Status: None   Collection Time: 09/10/19  5:05 PM   Specimen: Nasal Mucosa; Nasopharyngeal  Result Value Ref Range Status    MRSA by PCR NEGATIVE NEGATIVE Final    Comment:        The GeneXpert MRSA Assay (FDA approved for NASAL specimens only), is one component of a comprehensive MRSA colonization surveillance program. It is not intended to diagnose MRSA infection nor to guide or monitor treatment for MRSA infections. Performed at [facility], [place] [address], [place],  [address]      Labs: Basic Metabolic Panel: Recent Labs  Lab 09/12/19 2340 09/13/19 0831 09/14/19 0601 09/15/19 2800 09/16/19 0610 09/17/19 0633 09/18/19 0537  NA 136   < > 138 138 137 141 139  K 2.6*   < > 3.2* 2.9* 3.2* 3.5 3.7  CL 102   < > 103 102 102 105 103  CO2 21*   < > '26 26 27 27 27  ' GLUCOSE 114*   < > 102* 91 103* 100* 128*  BUN 12   < > '9 8 6 ' <5* <5*  CREATININE 0.44   < > 0.37* 0.35* 0.31* 0.32* 0.32*  CALCIUM 8.4*   < > 8.3* 8.5* 8.5* 8.7* 9.0  MG 1.3*  --   --  1.8 1.8  --  2.0   < > = values in this interval not displayed.   Liver Function Tests: Recent Labs  Lab 09/14/19 0601 09/15/19 0649 09/16/19 0610 09/17/19 0633 09/18/19 0537  AST '16 15 18 16 19  ' ALT '25 21 20 16 15  ' ALKPHOS 73 74 83 74 85  BILITOT 1.5* 1.2 1.1 0.9 0.6  PROT 5.3* 5.4* 5.6* 5.0* 5.4*  ALBUMIN 2.6* 2.6* 2.8* 2.6* 2.9*   No results for input(s): LIPASE, AMYLASE in the last 168 hours. No results for input(s): AMMONIA in the last 168 hours. CBC: Recent Labs  Lab 09/14/19 0601 09/15/19 0649 09/16/19 0610 09/17/19 0633 09/18/19 0537  WBC 0.6* 2.0* 2.7* 2.7* 3.1*  NEUTROABS 0.3* 1.1* 1.3* 1.2* 1.4*  HGB 8.0* 7.8* 8.5* 7.5* 7.8*  HCT 24.4* 23.6* 26.1* 23.6* 24.7*  MCV 95.7 95.9 96.3 97.9 98.0  PLT 8* 19* 19* 17* 46*   Cardiac Enzymes: No results for input(s): CKTOTAL, CKMB, CKMBINDEX, TROPONINI in the last 168 hours. BNP: BNP (last 3 results) No  results for input(s): BNP in the last 8760 hours.  ProBNP (last 3 results) No results for input(s): PROBNP in the last 8760 hours.  CBG: No results  for input(s): GLUCAP in the last 168 hours.     Signed:  Irine Seal MD.  [facility] 09/18/2019, 12:37 PM

## 2019-09-19 ENCOUNTER — Inpatient Hospital Stay: Payer: Medicaid Other

## 2019-09-19 ENCOUNTER — Telehealth: Payer: Self-pay

## 2019-09-19 DIAGNOSIS — R5081 Fever presenting with conditions classified elsewhere: Secondary | ICD-10-CM

## 2019-09-19 DIAGNOSIS — D696 Thrombocytopenia, unspecified: Secondary | ICD-10-CM

## 2019-09-19 NOTE — Telephone Encounter (Signed)
Transition Care Management Follow-up Telephone Call Date of discharge and from where: 09/18/2019, [facility]   Call placed to patient.  Dr Wynetta Emery is listed as her PCP.  The patient stated that she has a new PCP at [facility] that she will be following up with. Dr Wynetta Emery removed as PCP listed in [place]

## 2019-09-20 ENCOUNTER — Inpatient Hospital Stay: Payer: Medicaid Other

## 2019-09-20 ENCOUNTER — Telehealth: Payer: Self-pay | Admitting: *Deleted

## 2019-09-20 ENCOUNTER — Other Ambulatory Visit: Payer: Self-pay

## 2019-09-20 DIAGNOSIS — Z800 Family history of malignant neoplasm of digestive organs: Secondary | ICD-10-CM | POA: Diagnosis not present

## 2019-09-20 DIAGNOSIS — C9200 Acute myeloblastic leukemia, not having achieved remission: Secondary | ICD-10-CM | POA: Diagnosis present

## 2019-09-20 DIAGNOSIS — Z79899 Other long term (current) drug therapy: Secondary | ICD-10-CM | POA: Diagnosis not present

## 2019-09-20 DIAGNOSIS — C9501 Acute leukemia of unspecified cell type, in remission: Secondary | ICD-10-CM

## 2019-09-20 DIAGNOSIS — R197 Diarrhea, unspecified: Secondary | ICD-10-CM | POA: Diagnosis not present

## 2019-09-20 DIAGNOSIS — I119 Hypertensive heart disease without heart failure: Secondary | ICD-10-CM | POA: Diagnosis not present

## 2019-09-20 LAB — CMP (CANCER CENTER ONLY)
ALT: 14 U/L (ref 0–44)
AST: 17 U/L (ref 15–41)
Albumin: 3 g/dL — ABNORMAL LOW (ref 3.5–5.0)
Alkaline Phosphatase: 118 U/L (ref 38–126)
Anion gap: 9 (ref 5–15)
BUN: <4 mg/dL — ABNORMAL LOW (ref 6–20)
CO2: 25 mmol/L (ref 22–32)
Calcium: 9.5 mg/dL (ref 8.9–10.3)
Chloride: 106 mmol/L (ref 98–111)
Creatinine: 0.72 mg/dL (ref 0.44–1.00)
GFR, Est AFR Am: >60 mL/min (ref >60)
GFR, Estimated: >60 mL/min (ref >60)
Glucose, Bld: 117 mg/dL — ABNORMAL HIGH (ref 70–99)
Potassium: 3.3 mmol/L — ABNORMAL LOW (ref 3.5–5.1)
Sodium: 140 mmol/L (ref 135–145)
Total Bilirubin: 0.8 mg/dL (ref 0.3–1.2)
Total Protein: 6.2 g/dL — ABNORMAL LOW (ref 6.5–8.1)

## 2019-09-20 LAB — CBC WITH DIFFERENTIAL (CANCER CENTER ONLY)
Abs Immature Granulocytes: 0.25 10*3/uL — ABNORMAL HIGH (ref 0.00–0.07)
Basophils Absolute: 0 10*3/uL (ref 0.0–0.1)
Basophils Relative: 0 %
Eosinophils Absolute: 0 10*3/uL (ref 0.0–0.5)
Eosinophils Relative: 0 %
HCT: 28.6 % — ABNORMAL LOW (ref 36.0–46.0)
Hemoglobin: 9.1 g/dL — ABNORMAL LOW (ref 12.0–15.0)
Immature Granulocytes: 5 %
Lymphocytes Relative: 22 %
Lymphs Abs: 1 10*3/uL (ref 0.7–4.0)
MCH: 31.2 pg (ref 26.0–34.0)
MCHC: 31.8 g/dL (ref 30.0–36.0)
MCV: 97.9 fL (ref 80.0–100.0)
Monocytes Absolute: 1.4 10*3/uL — ABNORMAL HIGH (ref 0.1–1.0)
Monocytes Relative: 30 %
Neutro Abs: 1.9 10*3/uL (ref 1.7–7.7)
Neutrophils Relative %: 43 %
Platelet Count: 152 10*3/uL (ref 150–400)
RBC: 2.92 MIL/uL — ABNORMAL LOW (ref 3.87–5.11)
RDW: 17.9 % — ABNORMAL HIGH (ref 11.5–15.5)
WBC Count: 4.6 10*3/uL (ref 4.0–10.5)
nRBC: 2.6 % — ABNORMAL HIGH (ref 0.0–0.2)

## 2019-09-20 LAB — SAMPLE TO BLOOD BANK

## 2019-09-20 NOTE — Telephone Encounter (Signed)
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  She states that she is unable to take the tramadol as it causes gi issues.  She is already taking [phone] mg Gabapentin daily.  She wanted Dr. Libby Maw opinion on further management of this leg pain.    Routed to Dr Lorenso Courier to see if he wanted to evaluate or if he wanted neurology to give input for PN.  Pending response.

## 2019-09-23 ENCOUNTER — Telehealth: Payer: Self-pay | Admitting: *Deleted

## 2019-09-23 ENCOUNTER — Other Ambulatory Visit: Payer: Self-pay | Admitting: *Deleted

## 2019-09-23 ENCOUNTER — Telehealth: Payer: Self-pay | Admitting: Hematology and Oncology

## 2019-09-23 DIAGNOSIS — C9201 Acute myeloblastic leukemia, in remission: Secondary | ICD-10-CM

## 2019-09-23 DIAGNOSIS — T451X5A Adverse effect of antineoplastic and immunosuppressive drugs, initial encounter: Secondary | ICD-10-CM

## 2019-09-23 NOTE — Telephone Encounter (Signed)
Scheduled appointments per 9/20 scheduling message. Patient is aware of appointment dates and times according to said scheduling message.

## 2019-09-23 NOTE — Telephone Encounter (Signed)
Received call from patient asking about appts for labs and [name] this week. She has none scheduled. Scheduling message sent for labs tomorrow, 09/24/19 and again on Friday, 09/27/19, with MD appt after Friday labs.  Pt is aware of dates and times.  Pt states she feels ok today. Has upcoming appts with neurologist and cardiologist

## 2019-09-24 ENCOUNTER — Inpatient Hospital Stay: Payer: Medicaid Other

## 2019-09-24 ENCOUNTER — Other Ambulatory Visit: Payer: Self-pay

## 2019-09-24 DIAGNOSIS — C9200 Acute myeloblastic leukemia, not having achieved remission: Secondary | ICD-10-CM | POA: Diagnosis not present

## 2019-09-24 DIAGNOSIS — C9501 Acute leukemia of unspecified cell type, in remission: Secondary | ICD-10-CM

## 2019-09-24 LAB — CMP (CANCER CENTER ONLY)
ALT: 13 U/L (ref 0–44)
AST: 21 U/L (ref 15–41)
Albumin: 3 g/dL — ABNORMAL LOW (ref 3.5–5.0)
Alkaline Phosphatase: 124 U/L (ref 38–126)
Anion gap: 8 (ref 5–15)
BUN: 6 mg/dL (ref 6–20)
CO2: 26 mmol/L (ref 22–32)
Calcium: 9.5 mg/dL (ref 8.9–10.3)
Chloride: 104 mmol/L (ref 98–111)
Creatinine: 0.57 mg/dL (ref 0.44–1.00)
GFR, Est AFR Am: >60 mL/min (ref >60)
GFR, Estimated: >60 mL/min (ref >60)
Glucose, Bld: 100 mg/dL — ABNORMAL HIGH (ref 70–99)
Potassium: 4.4 mmol/L (ref 3.5–5.1)
Sodium: 138 mmol/L (ref 135–145)
Total Bilirubin: 0.7 mg/dL (ref 0.3–1.2)
Total Protein: 6.2 g/dL — ABNORMAL LOW (ref 6.5–8.1)

## 2019-09-24 LAB — CBC WITH DIFFERENTIAL (CANCER CENTER ONLY)
Abs Immature Granulocytes: 0.05 10*3/uL (ref 0.00–0.07)
Basophils Absolute: 0 10*3/uL (ref 0.0–0.1)
Basophils Relative: 1 %
Eosinophils Absolute: 0 10*3/uL (ref 0.0–0.5)
Eosinophils Relative: 0 %
HCT: 30.9 % — ABNORMAL LOW (ref 36.0–46.0)
Hemoglobin: 9.8 g/dL — ABNORMAL LOW (ref 12.0–15.0)
Immature Granulocytes: 1 %
Lymphocytes Relative: 13 %
Lymphs Abs: 0.6 10*3/uL — ABNORMAL LOW (ref 0.7–4.0)
MCH: 32.1 pg (ref 26.0–34.0)
MCHC: 31.7 g/dL (ref 30.0–36.0)
MCV: 101.3 fL — ABNORMAL HIGH (ref 80.0–100.0)
Monocytes Absolute: 0.9 10*3/uL (ref 0.1–1.0)
Monocytes Relative: 21 %
Neutro Abs: 2.8 10*3/uL (ref 1.7–7.7)
Neutrophils Relative %: 64 %
Platelet Count: 268 10*3/uL (ref 150–400)
RBC: 3.05 MIL/uL — ABNORMAL LOW (ref 3.87–5.11)
RDW: 21.3 % — ABNORMAL HIGH (ref 11.5–15.5)
WBC Count: 4.4 10*3/uL (ref 4.0–10.5)
nRBC: 0.7 % — ABNORMAL HIGH (ref 0.0–0.2)

## 2019-09-24 LAB — SAMPLE TO BLOOD BANK

## 2019-09-27 ENCOUNTER — Other Ambulatory Visit: Payer: Self-pay | Admitting: Hematology and Oncology

## 2019-09-27 ENCOUNTER — Inpatient Hospital Stay (HOSPITAL_BASED_OUTPATIENT_CLINIC_OR_DEPARTMENT_OTHER): Payer: Medicaid Other | Admitting: Hematology and Oncology

## 2019-09-27 ENCOUNTER — Inpatient Hospital Stay: Payer: Medicaid Other

## 2019-09-27 ENCOUNTER — Other Ambulatory Visit: Payer: Self-pay

## 2019-09-27 VITALS — BP 126/78 | HR 81 | Temp 97.1°F | Resp 18 | Ht 68.0 in | Wt 188.8 lb

## 2019-09-27 DIAGNOSIS — C9201 Acute myeloblastic leukemia, in remission: Secondary | ICD-10-CM

## 2019-09-27 DIAGNOSIS — C9200 Acute myeloblastic leukemia, not having achieved remission: Secondary | ICD-10-CM | POA: Diagnosis not present

## 2019-09-27 LAB — CMP (CANCER CENTER ONLY)
ALT: 16 U/L (ref 0–44)
AST: 24 U/L (ref 15–41)
Albumin: 3.1 g/dL — ABNORMAL LOW (ref 3.5–5.0)
Alkaline Phosphatase: 135 U/L — ABNORMAL HIGH (ref 38–126)
Anion gap: 8 (ref 5–15)
BUN: 6 mg/dL (ref 6–20)
CO2: 28 mmol/L (ref 22–32)
Calcium: 9.5 mg/dL (ref 8.9–10.3)
Chloride: 104 mmol/L (ref 98–111)
Creatinine: 0.61 mg/dL (ref 0.44–1.00)
GFR, Est AFR Am: >60 mL/min (ref >60)
GFR, Estimated: >60 mL/min (ref >60)
Glucose, Bld: 91 mg/dL (ref 70–99)
Potassium: 4 mmol/L (ref 3.5–5.1)
Sodium: 140 mmol/L (ref 135–145)
Total Bilirubin: 0.6 mg/dL (ref 0.3–1.2)
Total Protein: 6.7 g/dL (ref 6.5–8.1)

## 2019-09-27 LAB — CBC WITH DIFFERENTIAL (CANCER CENTER ONLY)
Abs Immature Granulocytes: 0.01 10*3/uL (ref 0.00–0.07)
Basophils Absolute: 0 10*3/uL (ref 0.0–0.1)
Basophils Relative: 1 %
Eosinophils Absolute: 0 10*3/uL (ref 0.0–0.5)
Eosinophils Relative: 0 %
HCT: 33 % — ABNORMAL LOW (ref 36.0–46.0)
Hemoglobin: 10.2 g/dL — ABNORMAL LOW (ref 12.0–15.0)
Immature Granulocytes: 0 %
Lymphocytes Relative: 28 %
Lymphs Abs: 0.7 10*3/uL (ref 0.7–4.0)
MCH: 31.9 pg (ref 26.0–34.0)
MCHC: 30.9 g/dL (ref 30.0–36.0)
MCV: 103.1 fL — ABNORMAL HIGH (ref 80.0–100.0)
Monocytes Absolute: 0.6 10*3/uL (ref 0.1–1.0)
Monocytes Relative: 23 %
Neutro Abs: 1.3 10*3/uL — ABNORMAL LOW (ref 1.7–7.7)
Neutrophils Relative %: 48 %
Platelet Count: 270 10*3/uL (ref 150–400)
RBC: 3.2 MIL/uL — ABNORMAL LOW (ref 3.87–5.11)
RDW: 22.2 % — ABNORMAL HIGH (ref 11.5–15.5)
WBC Count: 2.7 10*3/uL — ABNORMAL LOW (ref 4.0–10.5)
nRBC: 0 % (ref 0.0–0.2)

## 2019-09-27 LAB — SAMPLE TO BLOOD BANK

## 2019-09-27 LAB — SAVE SMEAR(SSMR), FOR PROVIDER SLIDE REVIEW

## 2019-09-27 NOTE — Progress Notes (Signed)
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 Found to have pancytopenia and peripheral blasts with intracellular inclusions.  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 4) 05/16/2019: repeat Bmbx shows no residual leukemia 5) 06/17/2019: patient returned for Cycle 1 Consolidation with cytarabine and gemtuzumab 6) 06/24/2019: presents to reconnect with [facility] for co-managed care. 7) 07/15/2019: Cycle 2 of consolidation therapy  8) 07/30/2019: admitted for BRBPR/ Infectious colitis.  9) 08/26/2019: start of Cycle 3 of consolidation therapy at [facility]  10) 09/09/2019: admitted with neutropenic fever and BRBPR  Interval History:  Kelly Roberson 59 y.o. female with medical history significant for AML s/p induction therapy and 3 cycles of consolidation who presents for a follow up visit. The patient's last visit was while inpatient on 09/13/2019 due to neutropenic fever/BRBPR. In the interim since the last visit the patient was discharged from the hospital after count recovery.    On exam today Kelly Roberson notes she is feeling better after discharge from her hospitalization.  She reports that she is had no further issues of bright red blood per rectum or lightheadedness or dizziness.  She notes that she is continue to have some diarrhea with no dark stools or overt signs of blood.  She is not currently taking any medications in order to slow down this periodic diarrhea.  She notes that her appetite has been "fair".  She is an upcoming visit with Dr. Elmo Putt on 10/09/2019 at which time she will undergo a repeat bone marrow biopsy.  She  otherwise denies having issues with fevers, chills, sweats, nausea, shortness of breath, or chest pain.  She is otherwise at her baseline.  A full 10 point ROS is a below.  MEDICAL HISTORY:  Past Medical History:  Diagnosis Date  . Anxiety   . Cancer ([place])   . Hypertension   . IBS (irritable bowel syndrome)     SURGICAL HISTORY: Past Surgical History:  Procedure Laterality Date  . GASTRIC BYPASS  2013  . NISSEN FUNDOPLICATION  [id number]   Reversed in 2013  . ORIF ANKLE FRACTURE Left 06/2017  . PLACEMENT OF BREAST IMPLANTS      SOCIAL HISTORY: Social History   Socioeconomic History  . Marital status: Divorced    Spouse name: Not on file  . Number of children: Not on file  . Years of education: Not on file  . Highest education level: Not on file  Occupational History  . Not on file  Tobacco Use  . Smoking status: Former Research scientist (life sciences)  . Smokeless tobacco: Never Used  Substance and Sexual Activity  . Alcohol use: Not Currently    Comment: occasionally   . Drug use: Not Currently  . Sexual activity: Not Currently  Other Topics Concern  . Not on file  Social History Narrative  . Not on file   Social Determinants of Health   Financial Resource Strain:   . Difficulty of Paying Living Expenses: Not on file  Food Insecurity:   . Worried About Charity fundraiser in the Last Year: Not on file  . Ran Out of Food in the Last Year: Not on file  Transportation Needs:   . Lack of  Transportation (Medical): Not on file  . Lack of Transportation (Non-Medical): Not on file  Physical Activity:   . Days of Exercise per Week: Not on file  . Minutes of Exercise per Session: Not on file  Stress:   . Feeling of Stress : Not on file  Social Connections:   . Frequency of Communication with Friends and Family: Not on file  . Frequency of Social Gatherings with Friends and Family: Not on file  . Attends Religious Services: Not on file  . Active Member of Clubs or Organizations: Not on file  .  Attends Archivist Meetings: Not on file  . Marital Status: Not on file  Intimate Partner Violence:   . Fear of Current or Ex-Partner: Not on file  . Emotionally Abused: Not on file  . Physically Abused: Not on file  . Sexually Abused: Not on file    FAMILY HISTORY: Family History  Problem Relation Age of Onset  . Pancreatic cancer Father   . Colon cancer Maternal Aunt   . Diabetes Maternal Uncle   . Pancreatic cancer Maternal Grandfather     ALLERGIES:  is allergic to ace inhibitors, lisinopril, and tylenol [acetaminophen].  MEDICATIONS:  Current Outpatient Medications  Medication Sig Dispense Refill  . traZODone (DESYREL) 50 MG tablet Take 50 mg by mouth at bedtime as needed for sleep.     Marland Kitchen acyclovir (ZOVIRAX) 400 MG tablet Take 400 mg by mouth 2 (two) times daily.    . Cholecalciferol 50 MCG (2000 UT) TABS Take 2,000 Units by mouth daily. (Patient not taking: Reported on 09/27/2019)    . clonazePAM (KLONOPIN) 0.5 MG tablet Take 1 tablet (0.5 mg total) by mouth 2 (two) times daily as needed for anxiety. 10 tablet 0  . cyclobenzaprine (FLEXERIL) 5 MG tablet Take 1 tablet (5 mg total) by mouth 3 (three) times daily as needed for muscle spasms. (Patient not taking: Reported on 07/29/2019) 30 tablet 0  . diltiazem (CARDIZEM CD) 120 MG 24 hr capsule Take 1 capsule (120 mg total) by mouth daily. 30 capsule 2  . entecavir (BARACLUDE) 0.5 MG tablet Take 0.5 mg by mouth daily.  (Patient not taking: Reported on 09/27/2019)    . folic acid (FOLVITE) 1 MG tablet Take 1 mg by mouth daily. (Patient not taking: Reported on 09/27/2019)    . gabapentin (NEURONTIN) 300 MG capsule Take 3-4 capsules (900-1,200 mg total) by mouth See admin instructions. Take 932m twice daily and 12047mat bedtime. 300 capsule 0  . hydrochlorothiazide (HYDRODIURIL) 25 MG tablet Take 1 tablet (25 mg total) by mouth daily. (Patient not taking: Reported on 09/27/2019) 30 tablet 2  . loperamide (IMODIUM) 2 MG capsule  Take 2 mg by mouth as needed for diarrhea or loose stools.     . magnesium oxide (MAG-OX) 400 (241.3 Mg) MG tablet Take 2 tablets (800 mg total) by mouth 2 (two) times daily. (Patient not taking: Reported on 09/27/2019) 120 tablet 0  . Multiple Vitamin (MULTIVITAMIN WITH MINERALS) TABS tablet Take 1 tablet by mouth daily. (Patient not taking: Reported on 09/27/2019) 30 tablet 0  . omeprazole (PRILOSEC) 20 MG capsule Take 1 capsule (20 mg total) by mouth daily. 30 capsule 2  . ondansetron (ZOFRAN) 4 MG tablet Take 1 tablet (4 mg total) by mouth every 6 (six) hours as needed for nausea. 20 tablet 0  . polyethylene glycol (MIRALAX / GLYCOLAX) 17 g packet Take 17 g by mouth daily as needed for moderate constipation  or severe constipation. (Patient not taking: Reported on 09/27/2019) 14 each 0  . potassium chloride (KLOR-CON) 20 MEQ packet Take 40 mEq by mouth daily. (Patient not taking: Reported on 09/27/2019) 60 packet 0  . senna-docusate (SENOKOT-S) 8.6-50 MG tablet Take 1 tablet by mouth at bedtime. (Patient not taking: Reported on 09/10/2019) 30 tablet 0  . traMADol (ULTRAM) 50 MG tablet Take 1 tablet (50 mg total) by mouth every 8 (eight) hours as needed (mild pain). (Patient not taking: Reported on 09/27/2019) 15 tablet 0   No current facility-administered medications for this visit.    REVIEW OF SYSTEMS:   Constitutional: ( - ) fevers, ( - )  chills , ( - ) night sweats Eyes: ( - ) blurriness of vision, ( - ) double vision, ( - ) watery eyes Ears, nose, mouth, throat, and face: ( - ) mucositis, ( - ) sore throat Respiratory: ( - ) cough, ( - ) dyspnea, ( - ) wheezes Cardiovascular: ( - ) palpitation, ( - ) chest discomfort, ( - ) lower extremity swelling Gastrointestinal:  ( - ) nausea, ( - ) heartburn, ( - ) change in bowel habits Skin: ( - ) abnormal skin rashes Lymphatics: ( - ) new lymphadenopathy, ( - ) easy bruising Neurological: ( - ) numbness, ( - ) tingling, ( - ) new  weaknesses Behavioral/Psych: ( - ) mood change, ( - ) new changes  All other systems were reviewed with the patient and are negative.  PHYSICAL EXAMINATION: ECOG PERFORMANCE STATUS: 1 - Symptomatic but completely ambulatory  Vitals:   09/27/19 1548  BP: 126/78  Pulse: 81  Resp: 18  Temp: (!) 97.1 F (36.2 C)  SpO2: 100%   Filed Weights   09/27/19 1548  Weight: 188 lb 12.8 oz (85.6 kg)    GENERAL: well appearing middle aged Caucasian female, alert, no distress and comfortable SKIN: skin color, texture, turgor are normal, no rashes or significant lesions EYES: conjunctiva are pink and non-injected, sclera clear LUNGS: clear to auscultation and percussion with normal breathing effort HEART: regular rate & rhythm and no murmurs and no lower extremity edema Musculoskeletal: no cyanosis of digits and no clubbing  PSYCH: alert & oriented x 3, fluent speech NEURO: no focal motor/sensory deficits  LABORATORY DATA:  I have reviewed the data as listed CBC Latest Ref Rng & Units 09/27/2019 09/24/2019 09/20/2019  WBC 4.0 - 10.5 K/uL 2.7(L) 4.4 4.6  Hemoglobin 12.0 - 15.0 g/dL 10.2(L) 9.8(L) 9.1(L)  Hematocrit 36 - 46 % 33.0(L) 30.9(L) 28.6(L)  Platelets 150 - 400 K/uL 270 268 152    CMP Latest Ref Rng & Units 09/27/2019 09/24/2019 09/20/2019  Glucose 70 - 99 mg/dL 91 100(H) 117(H)  BUN 6 - 20 mg/dL 6 6 <4(L)  Creatinine 0.44 - 1.00 mg/dL 0.61 0.57 0.72  Sodium 135 - 145 mmol/L 140 138 140  Potassium 3.5 - 5.1 mmol/L 4.0 4.4 3.3(L)  Chloride 98 - 111 mmol/L 104 104 106  CO2 22 - 32 mmol/L _0 Calcium 8.9 - 10.3 mg/dL 9.5 9.5 9.5  Total Protein 6.5 - 8.1 g/dL 6.7 6.2(L) 6.2(L)  Total Bilirubin 0.3 - 1.2 mg/dL 0.6 0.7 0.8  Alkaline Phos 38 - 126 U/L 135(H) 124 118  AST 15 - 41 U/L _1 ALT 0 - 44 U/L _2 RADIOGRAPHIC STUDIES: DG Chest 1 View  Result Date: 09/11/2019 CLINICAL DATA:  Hypoxemia EXAM: CHEST  1 VIEW  COMPARISON:  09/09/2019 FINDINGS: Right upper  extremity central venous catheter tip over the SVC. Streaky atelectasis right base. Interim development of patchy airspace disease in the left mid and lower lung. Stable cardiomediastinal silhouette. No pneumothorax. IMPRESSION: Interim development of patchy airspace disease in the left mid and lower lung, possible pneumonia. Electronically Signed   By: Donavan Foil M.D.   On: 09/11/2019 18:57   DG Chest 2 View  Result Date: 09/15/2019 CLINICAL DATA:  Pneumonia shortness of breath, leukemia history of hypertension. EXAM: CHEST - 2 VIEW COMPARISON:  09/11/2019 FINDINGS: RIGHT-sided PICC line terminates at the caval to atrial junction. Trachea midline. Cardiomediastinal contours and hilar structures are normal. Elevation of the RIGHT hemidiaphragm as before with airspace disease overlying the RIGHT hemidiaphragm and mild blunting of RIGHT costodiaphragmatic sulcus suggesting small effusion. On limited assessment skeletal structures without acute process. IMPRESSION: RIGHT basilar airspace disease and possible small RIGHT effusion. Airspace process may represent developing infection or juxta diaphragmatic atelectasis in the setting of elevated RIGHT hemidiaphragm. Interval improvement with respect aeration at the LEFT lung base. Electronically Signed   By: Zetta Bills M.D.   On: 09/15/2019 13:01   CT Head Wo Contrast  Result Date: 09/10/2019 CLINICAL DATA:  Headache. Tremor and fever starting yesterday. History of leukemia. Chemotherapy last Wednesday. Nose surgery on Friday. EXAM: CT HEAD WITHOUT CONTRAST TECHNIQUE: Contiguous axial images were obtained from the base of the skull through the vertex without intravenous contrast. COMPARISON:  07/01/2019 FINDINGS: Brain: Mild cerebral atrophy. No ventricular dilatation. Patchy low-attenuation changes in the deep white matter consistent with small vessel ischemic changes and lacunar infarcts. No mass effect or midline shift. No abnormal extra-axial fluid  collections. Gray-white matter junctions are distinct. Basal cisterns are not effaced. No acute intracranial hemorrhage. Vascular: Intracranial arterial vascular calcifications are present. Skull: The calvarium appears intact. Sinuses/Orbits: Paranasal sinuses and mastoid air cells are clear. Other: None. IMPRESSION: 1. No acute intracranial abnormalities. 2. Chronic atrophy and small vessel ischemic changes. Electronically Signed   By: Lucienne Capers M.D.   On: 09/10/2019 01:05   CT ABDOMEN PELVIS W CONTRAST  Result Date: 09/09/2019 CLINICAL DATA:  Leukemia.  Recent chemotherapy.  Fever. EXAM: CT ABDOMEN AND PELVIS WITH CONTRAST TECHNIQUE: Multidetector CT imaging of the abdomen and pelvis was performed using the standard protocol following bolus administration of intravenous contrast. CONTRAST:  148m OMNIPAQUE IOHEXOL 300 MG/ML  SOLN COMPARISON:  07/29/2019 FINDINGS: Lower chest: Linear scarring or atelectasis in the right lung base. No effusions or acute abnormality. Hepatobiliary: Low-density lesion posteriorly in the liver is stable since prior study measuring 1.7 cm. This is likely hemangioma. No suspicious Vac abnormality. Gallbladder unremarkable. Pancreas: No focal abnormality or ductal dilatation. Spleen: No focal abnormality.  Normal size. Adrenals/Urinary Tract: No adrenal abnormality. No focal renal abnormality. No stones or hydronephrosis. Urinary bladder is unremarkable. Stomach/Bowel: There is fatty proliferation within the right colonic wall, likely related to prior inflammatory process/infection. No evidence for active enteritis/colitis. Prior gastric bypass. Vascular/Lymphatic: No evidence of aneurysm or adenopathy. Scattered aortic calcifications. Reproductive: Uterus and adnexa unremarkable.  No mass. Other: No free fluid or free air. Musculoskeletal: No acute bony abnormality. IMPRESSION: No acute findings in the abdomen or pelvis. Electronically Signed   By: KRolm BaptiseM.D.   On:  09/09/2019 22:12   DG Chest Port 1 View  Result Date: 09/09/2019 CLINICAL DATA:  Leukemia patient.  Fever.  Questionable sepsis. EXAM: PORTABLE CHEST 1 VIEW COMPARISON:  07/29/2019 FINDINGS: Cardiac silhouette is  normal in size. No mediastinal or hilar masses or evidence of adenopathy. Clear lungs.  No pleural effusion or pneumothorax. Skeletal structures are grossly intact. IMPRESSION: No active disease. Electronically Signed   By: Lajean Manes M.D.   On: 09/09/2019 20:58   ECHOCARDIOGRAM COMPLETE  Result Date: 09/12/2019    ECHOCARDIOGRAM REPORT   Patient Name:   SHEMIKA Roberson Date of Exam: 09/12/2019 Medical Rec #:  [id number]      Height:       68.0 in Accession #:    [id number]     Weight:       213.4 lb Date of Birth:  January 01, 1961     BSA:          2.101 m Patient Age:    40 years       BP:           142/88 mmHg Patient Gender: F              HR:           99 bpm. Exam Location:  Inpatient Procedure: 2D Echo Indications:    Acute Respiratory Insufficiency R06.89  History:        Patient has no prior history of Echocardiogram examinations.                 Risk Factors:Hypertension.  Sonographer:    Mikki Santee RDCS (AE) Referring Phys: Show Low  1. Left ventricular ejection fraction, by estimation, is 50 to 55%. The left ventricle has low normal function. The left ventricle has no regional wall motion abnormalities. The left ventricular internal cavity size was mildly dilated. Left ventricular diastolic parameters are consistent with Grade II diastolic dysfunction (pseudonormalization).  2. Right ventricular systolic function is normal. The right ventricular size is normal. There is moderately elevated pulmonary artery systolic pressure.  3. Left atrial size was moderately dilated.  4. Right atrial size was mildly dilated.  5. The mitral valve is normal in structure. Mild mitral valve regurgitation.  6. The aortic valve is tricuspid. Aortic valve regurgitation is not  visualized. No aortic stenosis is present.  7. The inferior vena cava is dilated in size with <50% respiratory variability, suggesting right atrial pressure of 15 mmHg. Comparison(s): No prior Echocardiogram. FINDINGS  Left Ventricle: Left ventricular ejection fraction, by estimation, is 50 to 55%. The left ventricle has low normal function. The left ventricle has no regional wall motion abnormalities. The left ventricular internal cavity size was mildly dilated. There is no left ventricular hypertrophy. Left ventricular diastolic parameters are consistent with Grade II diastolic dysfunction (pseudonormalization). Right Ventricle: The right ventricular size is normal. No increase in right ventricular wall thickness. Right ventricular systolic function is normal. There is moderately elevated pulmonary artery systolic pressure. The tricuspid regurgitant velocity is 2.91 m/s, and with an assumed right atrial pressure of 15 mmHg, the estimated right ventricular systolic pressure is 82.9 mmHg. Left Atrium: Left atrial size was moderately dilated. Right Atrium: Right atrial size was mildly dilated. Pericardium: There is no evidence of pericardial effusion. Mitral Valve: The mitral valve is normal in structure. Mild to moderate mitral annular calcification. Mild mitral valve regurgitation. Tricuspid Valve: The tricuspid valve is normal in structure. Tricuspid valve regurgitation is mild . No evidence of tricuspid stenosis. Aortic Valve: The aortic valve is tricuspid. Aortic valve regurgitation is not visualized. No aortic stenosis is present. Pulmonic Valve: The pulmonic valve was not well visualized. Pulmonic valve regurgitation is trivial. No  evidence of pulmonic stenosis. Aorta: The aortic root, ascending aorta and aortic arch are all structurally normal, with no evidence of dilitation or obstruction. Venous: The inferior vena cava is dilated in size with less than 50% respiratory variability, suggesting right atrial  pressure of 15 mmHg. IAS/Shunts: The atrial septum is grossly normal. Additional Comments: A venous catheter is visualized in the right atrium.  LEFT VENTRICLE PLAX 2D LVIDd:         5.10 cm  Diastology LVIDs:         3.80 cm  LV e' medial:    6.89 cm/s LV PW:         1.10 cm  LV E/e' medial:  11.7 LV IVS:        1.00 cm  LV e' lateral:   6.24 cm/s LVOT diam:     2.20 cm  LV E/e' lateral: 12.9 LV SV:         68 LV SV Index:   33 LVOT Area:     3.80 cm  RIGHT VENTRICLE RV S prime:     14.30 cm/s TAPSE (M-mode): 2.0 cm LEFT ATRIUM             Index       RIGHT ATRIUM           Index LA diam:        3.70 cm 1.76 cm/m  RA Area:     19.30 cm LA Vol (A2C):   93.7 ml 44.60 ml/m RA Volume:   57.00 ml  27.13 ml/m LA Vol (A4C):   52.4 ml 24.94 ml/m LA Biplane Vol: 72.7 ml 34.60 ml/m  AORTIC VALVE LVOT Vmax:   88.30 cm/s LVOT Vmean:  67.000 cm/s LVOT VTI:    0.180 m  AORTA Ao Root diam: 3.10 cm MITRAL VALVE               TRICUSPID VALVE MV Area (PHT): 3.99 cm    TR Peak grad:   33.9 mmHg MV Decel Time: 190 msec    TR Vmax:        291.00 cm/s MV E velocity: 80.60 cm/s MV A velocity: 72.00 cm/s  SHUNTS MV E/A ratio:  1.12        Systemic VTI:  0.18 m                            Systemic Diam: 2.20 cm Buford Dresser MD Electronically signed by Buford Dresser MD Signature Date/Time: 09/12/2019/6:19:25 PM    Final    VAS [place] UPPER EXTREMITY VENOUS DUPLEX  Result Date: 09/13/2019 UPPER VENOUS STUDY  Indications: Swelling Risk Factors: None identified. Limitations: Poor ultrasound/tissue interface, bandages and line. Comparison Study: No prior studies. Performing Technologist: Oliver Hum RVT  Examination Guidelines: A complete evaluation includes B-mode imaging, spectral Doppler, color Doppler, and power Doppler as needed of all accessible portions of each vessel. Bilateral testing is considered an integral part of a complete examination. Limited examinations for reoccurring indications may be performed as  noted.  Right Findings: +----------+------------+---------+-----------+----------+-------+ RIGHT     CompressiblePhasicitySpontaneousPropertiesSummary +----------+------------+---------+-----------+----------+-------+ IJV           Full       Yes       Yes                      +----------+------------+---------+-----------+----------+-------+ Subclavian    Full       Yes  Yes                      +----------+------------+---------+-----------+----------+-------+ Axillary      Full       Yes       Yes                      +----------+------------+---------+-----------+----------+-------+ Brachial      Full       Yes       Yes                      +----------+------------+---------+-----------+----------+-------+ Radial        Full                                          +----------+------------+---------+-----------+----------+-------+ Ulnar         Full                                          +----------+------------+---------+-----------+----------+-------+ Cephalic      Full                                          +----------+------------+---------+-----------+----------+-------+ Basilic       Full                                          +----------+------------+---------+-----------+----------+-------+  Left Findings: +----------+------------+---------+-----------+----------+-------+ LEFT      CompressiblePhasicitySpontaneousPropertiesSummary +----------+------------+---------+-----------+----------+-------+ Subclavian    Full       Yes       Yes                      +----------+------------+---------+-----------+----------+-------+  Summary:  Right: No evidence of deep vein thrombosis in the upper extremity. No evidence of superficial vein thrombosis in the upper extremity.  Left: No evidence of thrombosis in the subclavian.  *See table(s) above for measurements and observations.  Diagnosing physician: Servando Snare MD  Electronically signed by Servando Snare MD on 09/13/2019 at 9:17:22 AM.    Final    [place] EKG SITE RITE  Result Date: 09/11/2019 If Site Rite image not attached, placement could not be confirmed due to current cardiac rhythm.   ASSESSMENT & PLAN Kelly Roberson 59 y.o. female with medical history significant for AML s/p induction therapy and 1 cycle of consolidation who presents for a follow up visit.  We are happy to have Kelly Roberson return to clinic after having been sent to [facility] for induction chemotherapy for acute myeloid leukemia.  Previously we discussed the nature of comanaged care in the treatment of acute myeloid leukemia.  Comanaged care involves the treatment recommendations and administration of chemotherapy by a specialized academic cancer center with local support here at our community hospital.  We would be happy to provide services including emergency care, medication refills, blood tests, transfusion, and supportive care such as G-CSF in the management of her AML.  Major clinical decisions regarding the treatment of the leukemia itself are deferred to [facility].  We are  also able to provide imaging services as well as bone marrow biopsy if required.  On exam today Kelly Roberson appears to be at baseline other than some mild diarrhea.  She is no longer having bright red blood per rectum and denies having any fevers or infectious symptoms at home.  She has an upcoming visit with Dr. Elmo Putt on 10/09/2019 for repeat bone marrow biopsy.  We will plan our follow-up based on his recommendations after that visit.  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 Elmo Putt providing recommendations. We are happy to provide blood checks and treatment for urgent issues locally. --patient currently completed induction and 3 cycles of consolidation therapy.  Will be meeting with Dr. Elmo Putt to discuss maintenance Onureg therapy and have repeat Bmbx on 10/09/2019.  -- Her Hgb and Plt today are well above transfusion threshold. Counts appears stable, will check labs one more time (next week) due to neutropenia.  --continue f/u at [facility] with Dr. Elmo Putt. We will continue to monitor her closely locally. --RTC pending next [facility] visit as recommended by Dr. Elmo Putt  #Infection Prophylaxis --continue acyclovir 456m BID -- no f/c/s on exam today.   No orders of the defined types were placed in this encounter.  All questions were answered. The patient knows to call the clinic with any problems, questions or concerns.  A total of more than 30 minutes were spent on this encounter and over half of that time was spent on counseling and coordination of care as outlined above.   JLedell Peoples MD Department of Hematology/Oncology [facility]Phone: [phone]: [phone]: [email or web address]  09/29/2019 12:01 PM

## 2019-09-29 ENCOUNTER — Encounter: Payer: Self-pay | Admitting: Hematology and Oncology

## 2019-10-04 ENCOUNTER — Other Ambulatory Visit: Payer: Self-pay

## 2019-10-04 ENCOUNTER — Inpatient Hospital Stay: Payer: Medicaid Other | Attending: Hematology and Oncology

## 2019-10-04 DIAGNOSIS — C9200 Acute myeloblastic leukemia, not having achieved remission: Secondary | ICD-10-CM | POA: Diagnosis present

## 2019-10-04 DIAGNOSIS — T451X5A Adverse effect of antineoplastic and immunosuppressive drugs, initial encounter: Secondary | ICD-10-CM

## 2019-10-04 DIAGNOSIS — C9201 Acute myeloblastic leukemia, in remission: Secondary | ICD-10-CM

## 2019-10-04 LAB — CMP (CANCER CENTER ONLY)
ALT: 18 U/L (ref 0–44)
AST: 34 U/L (ref 15–41)
Albumin: 3.3 g/dL — ABNORMAL LOW (ref 3.5–5.0)
Alkaline Phosphatase: 148 U/L — ABNORMAL HIGH (ref 38–126)
Anion gap: 8 (ref 5–15)
BUN: 6 mg/dL (ref 6–20)
CO2: 27 mmol/L (ref 22–32)
Calcium: 9.8 mg/dL (ref 8.9–10.3)
Chloride: 105 mmol/L (ref 98–111)
Creatinine: 0.63 mg/dL (ref 0.44–1.00)
GFR, Est AFR Am: >60 mL/min (ref >60)
GFR, Estimated: >60 mL/min (ref >60)
Glucose, Bld: 92 mg/dL (ref 70–99)
Potassium: 4.2 mmol/L (ref 3.5–5.1)
Sodium: 140 mmol/L (ref 135–145)
Total Bilirubin: 0.7 mg/dL (ref 0.3–1.2)
Total Protein: 6.7 g/dL (ref 6.5–8.1)

## 2019-10-04 LAB — CBC WITH DIFFERENTIAL (CANCER CENTER ONLY)
Abs Immature Granulocytes: 0.01 10*3/uL (ref 0.00–0.07)
Basophils Absolute: 0.1 10*3/uL (ref 0.0–0.1)
Basophils Relative: 2 %
Eosinophils Absolute: 0 10*3/uL (ref 0.0–0.5)
Eosinophils Relative: 0 %
HCT: 38.2 % (ref 36.0–46.0)
Hemoglobin: 11.7 g/dL — ABNORMAL LOW (ref 12.0–15.0)
Immature Granulocytes: 0 %
Lymphocytes Relative: 32 %
Lymphs Abs: 0.8 10*3/uL (ref 0.7–4.0)
MCH: 31.7 pg (ref 26.0–34.0)
MCHC: 30.6 g/dL (ref 30.0–36.0)
MCV: 103.5 fL — ABNORMAL HIGH (ref 80.0–100.0)
Monocytes Absolute: 0.5 10*3/uL (ref 0.1–1.0)
Monocytes Relative: 20 %
Neutro Abs: 1.2 10*3/uL — ABNORMAL LOW (ref 1.7–7.7)
Neutrophils Relative %: 46 %
Platelet Count: 198 10*3/uL (ref 150–400)
RBC: 3.69 MIL/uL — ABNORMAL LOW (ref 3.87–5.11)
RDW: 19.9 % — ABNORMAL HIGH (ref 11.5–15.5)
WBC Count: 2.6 10*3/uL — ABNORMAL LOW (ref 4.0–10.5)
nRBC: 0 % (ref 0.0–0.2)

## 2019-10-04 LAB — SAMPLE TO BLOOD BANK

## 2019-10-28 ENCOUNTER — Telehealth: Payer: Self-pay | Admitting: *Deleted

## 2019-10-28 NOTE — Telephone Encounter (Signed)
Received call from patient requesting monthly labs and MD appts with [name]. This request comes from  Dr. Elmo Putt at [facility].  Pt has had numerous labs done this month so she would like to start mid-November. Scheduling message sent. She also informed this nurse that she will not be starting any oral chemo at this time per her decision with Dr. Elmo Putt and herself.  Her bone marrow biopsy came back  As 'no cancer'  per patient.  Scheduling message sent

## 2019-10-29 ENCOUNTER — Telehealth: Payer: Self-pay | Admitting: Hematology and Oncology

## 2019-10-29 NOTE — Telephone Encounter (Signed)
Scheduled per 10/25 schedule messge

## 2019-11-21 ENCOUNTER — Inpatient Hospital Stay: Payer: Medicaid Other | Admitting: Hematology and Oncology

## 2019-11-21 ENCOUNTER — Inpatient Hospital Stay: Payer: Medicaid Other

## 2019-12-02 ENCOUNTER — Other Ambulatory Visit: Payer: Self-pay

## 2019-12-02 ENCOUNTER — Other Ambulatory Visit: Payer: Self-pay | Admitting: Hematology and Oncology

## 2019-12-02 ENCOUNTER — Inpatient Hospital Stay: Payer: Medicaid Other | Attending: Hematology and Oncology

## 2019-12-02 ENCOUNTER — Inpatient Hospital Stay (HOSPITAL_BASED_OUTPATIENT_CLINIC_OR_DEPARTMENT_OTHER): Payer: Medicaid Other | Admitting: Hematology and Oncology

## 2019-12-02 VITALS — BP 133/68 | HR 70 | Temp 97.2°F | Resp 15 | Ht 68.0 in | Wt 185.1 lb

## 2019-12-02 DIAGNOSIS — Z79899 Other long term (current) drug therapy: Secondary | ICD-10-CM | POA: Diagnosis not present

## 2019-12-02 DIAGNOSIS — C9200 Acute myeloblastic leukemia, not having achieved remission: Secondary | ICD-10-CM | POA: Diagnosis present

## 2019-12-02 DIAGNOSIS — C9201 Acute myeloblastic leukemia, in remission: Secondary | ICD-10-CM

## 2019-12-02 DIAGNOSIS — F419 Anxiety disorder, unspecified: Secondary | ICD-10-CM | POA: Insufficient documentation

## 2019-12-02 DIAGNOSIS — D696 Thrombocytopenia, unspecified: Secondary | ICD-10-CM | POA: Diagnosis not present

## 2019-12-02 DIAGNOSIS — I10 Essential (primary) hypertension: Secondary | ICD-10-CM | POA: Insufficient documentation

## 2019-12-02 DIAGNOSIS — R5383 Other fatigue: Secondary | ICD-10-CM | POA: Diagnosis not present

## 2019-12-02 DIAGNOSIS — Z9221 Personal history of antineoplastic chemotherapy: Secondary | ICD-10-CM | POA: Diagnosis not present

## 2019-12-02 DIAGNOSIS — K589 Irritable bowel syndrome without diarrhea: Secondary | ICD-10-CM | POA: Diagnosis not present

## 2019-12-02 DIAGNOSIS — T451X5A Adverse effect of antineoplastic and immunosuppressive drugs, initial encounter: Secondary | ICD-10-CM

## 2019-12-02 DIAGNOSIS — Z87891 Personal history of nicotine dependence: Secondary | ICD-10-CM | POA: Insufficient documentation

## 2019-12-02 LAB — CBC WITH DIFFERENTIAL (CANCER CENTER ONLY)
Abs Immature Granulocytes: 0 10*3/uL (ref 0.00–0.07)
Basophils Absolute: 0 10*3/uL (ref 0.0–0.1)
Basophils Relative: 1 %
Eosinophils Absolute: 0.2 10*3/uL (ref 0.0–0.5)
Eosinophils Relative: 3 %
HCT: 46.9 % — ABNORMAL HIGH (ref 36.0–46.0)
Hemoglobin: 15.2 g/dL — ABNORMAL HIGH (ref 12.0–15.0)
Immature Granulocytes: 0 %
Lymphocytes Relative: 39 %
Lymphs Abs: 1.8 10*3/uL (ref 0.7–4.0)
MCH: 31.5 pg (ref 26.0–34.0)
MCHC: 32.4 g/dL (ref 30.0–36.0)
MCV: 97.3 fL (ref 80.0–100.0)
Monocytes Absolute: 0.4 10*3/uL (ref 0.1–1.0)
Monocytes Relative: 9 %
Neutro Abs: 2.2 10*3/uL (ref 1.7–7.7)
Neutrophils Relative %: 48 %
Platelet Count: 130 10*3/uL — ABNORMAL LOW (ref 150–400)
RBC: 4.82 MIL/uL (ref 3.87–5.11)
RDW: 15.1 % (ref 11.5–15.5)
WBC Count: 4.6 10*3/uL (ref 4.0–10.5)
nRBC: 0 % (ref 0.0–0.2)

## 2019-12-02 LAB — CMP (CANCER CENTER ONLY)
ALT: 24 U/L (ref 0–44)
AST: 28 U/L (ref 15–41)
Albumin: 4 g/dL (ref 3.5–5.0)
Alkaline Phosphatase: 111 U/L (ref 38–126)
Anion gap: 13 (ref 5–15)
BUN: 8 mg/dL (ref 6–20)
CO2: 26 mmol/L (ref 22–32)
Calcium: 10.2 mg/dL (ref 8.9–10.3)
Chloride: 102 mmol/L (ref 98–111)
Creatinine: 0.67 mg/dL (ref 0.44–1.00)
GFR, Estimated: >60 mL/min (ref >60)
Glucose, Bld: 88 mg/dL (ref 70–99)
Potassium: 3.6 mmol/L (ref 3.5–5.1)
Sodium: 141 mmol/L (ref 135–145)
Total Bilirubin: 0.7 mg/dL (ref 0.3–1.2)
Total Protein: 7.2 g/dL (ref 6.5–8.1)

## 2019-12-02 LAB — LACTATE DEHYDROGENASE: LDH: 135 U/L (ref 98–192)

## 2019-12-02 LAB — SAMPLE TO BLOOD BANK

## 2019-12-02 NOTE — Progress Notes (Signed)
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 Found to have pancytopenia and peripheral blasts with intracellular inclusions.  2) 05/01/2019: transferred to [facility] 3) 05/04/2019: patient started induction therapy for AML with 7+3 and gemtuzumab on Day 4. 4) 05/16/2019: repeat Bmbx shows no residual leukemia 5) 06/17/2019: patient returned for Cycle 1 Consolidation with cytarabine and gemtuzumab 6) 06/24/2019: presents to reconnect with [facility] for co-managed care. 7) 07/15/2019: Cycle 2 of consolidation therapy  8) 07/30/2019: admitted for BRBPR/ Infectious colitis.  9) 08/26/2019: start of Cycle 3 of consolidation therapy at [facility]  10) 09/09/2019: admitted with neutropenic fever and BRBPR 11) 10/23/2019: evaluated by Dr. Elmo Putt. After discussion with him decided against oral azacitidine therapy   Interval History:  Kelly Roberson 59 y.o. female with medical history significant for AML s/p induction therapy and 3 cycles of consolidation who presents for a follow up visit. The patient's last visit was on 09/27/2019. In the interim she was seen by Dr. Elmo Putt on 10/23/2019 and it was decided to continue with observation rather than oral azacitidine.  On exam today Kelly Roberson notes continued to have issues with fatigue since our last visit.  She reports that she did have a cold that ended last week and last for approximately 2.5 weeks.  She was tested for Covid twice during that episode and was found to be negative.  She notes that her hair is growing back and is quite curly and causing some itching on the back of her head.  She also notes that her her medication was recently changed around by the cardiologist and that  she has been feeling like she has had increased palpitations.  She notes that she has not had any issues with blood in the stool and should be scheduled for an endoscopy and colonoscopy shortly.  Her last scheduled 1 was canceled due to the previously mentioned cold.  Otherwise she denies having any fevers, chills, sweats, nausea, vomiting or diarrhea.  She is being referred to a neurologist for consideration of management of her neuropathy.  She is also received vitamin B12 shot from primary care provider in order to help with her energy levels.  A full 10 point ROS is listed below.  MEDICAL HISTORY:  Past Medical History:  Diagnosis Date   Anxiety    Cancer ([place])    Hypertension    IBS (irritable bowel syndrome)     SURGICAL HISTORY: Past Surgical History:  Procedure Laterality Date   GASTRIC BYPASS  3151   NISSEN FUNDOPLICATION  7616   Reversed in 2013   ORIF ANKLE FRACTURE Left 06/2017   PLACEMENT OF BREAST IMPLANTS      SOCIAL HISTORY: Social History   Socioeconomic History   Marital status: Divorced    Spouse name: Not on file   Number of children: Not on file   Years of education: Not on file   Highest education level: Not on file  Occupational History   Not on file  Tobacco Use   Smoking status: Former Smoker   Smokeless tobacco: Never Used  Substance and Sexual Activity   Alcohol use: Not Currently    Comment: occasionally    Drug use: Not Currently   Sexual activity: Not Currently  Other Topics Concern  Not on file  Social History Narrative   Not on file   Social Determinants of Health   Financial Resource Strain:    Difficulty of Paying Living Expenses: Not on file  Food Insecurity:    Worried About [place] in the Last Year: Not on file   Ran Out of Food in the Last Year: Not on file  Transportation Needs:    Lack of Transportation (Medical): Not on file   Lack of Transportation (Non-Medical): Not on file   Physical Activity:    Days of Exercise per Week: Not on file   Minutes of Exercise per Session: Not on file  Stress:    Feeling of Stress : Not on file  Social Connections:    Frequency of Communication with Friends and Family: Not on file   Frequency of Social Gatherings with Friends and Family: Not on file   Attends Religious Services: Not on file   Active Member of Clubs or Organizations: Not on file   Attends Archivist Meetings: Not on file   Marital Status: Not on file  Intimate Partner Violence:    Fear of Current or Ex-Partner: Not on file   Emotionally Abused: Not on file   Physically Abused: Not on file   Sexually Abused: Not on file    FAMILY HISTORY: Family History  Problem Relation Age of Onset   Pancreatic cancer Father    Colon cancer Maternal Aunt    Diabetes Maternal Uncle    Pancreatic cancer Maternal Grandfather     ALLERGIES:  is allergic to ace inhibitors, lisinopril, and tylenol [acetaminophen].  MEDICATIONS:  Current Outpatient Medications  Medication Sig Dispense Refill   acyclovir (ZOVIRAX) 400 MG tablet Take 400 mg by mouth 2 (two) times daily.     clonazePAM (KLONOPIN) 0.5 MG tablet Take 1 tablet (0.5 mg total) by mouth 2 (two) times daily as needed for anxiety. 10 tablet 0   cyclobenzaprine (FLEXERIL) 5 MG tablet Take 1 tablet (5 mg total) by mouth 3 (three) times daily as needed for muscle spasms. (Patient not taking: Reported on 07/29/2019) 30 tablet 0   diltiazem (CARDIZEM CD) 120 MG 24 hr capsule Take 1 capsule (120 mg total) by mouth daily. 30 capsule 2   entecavir (BARACLUDE) 0.5 MG tablet Take 0.5 mg by mouth daily.  (Patient not taking: Reported on 8/54/6270)     folic acid (FOLVITE) 1 MG tablet Take 1 mg by mouth daily. (Patient not taking: Reported on 09/27/2019)     gabapentin (NEURONTIN) 300 MG capsule Take 3-4 capsules (900-1,200 mg total) by mouth See admin instructions. Take 973m twice daily and  12066mat bedtime. 300 capsule 0   hydrochlorothiazide (HYDRODIURIL) 25 MG tablet Take 1 tablet (25 mg total) by mouth daily. (Patient not taking: Reported on 09/27/2019) 30 tablet 2   loperamide (IMODIUM) 2 MG capsule Take 2 mg by mouth as needed for diarrhea or loose stools.      magnesium oxide (MAG-OX) 400 (241.3 Mg) MG tablet Take 2 tablets (800 mg total) by mouth 2 (two) times daily. (Patient not taking: Reported on 09/27/2019) 120 tablet 0   Multiple Vitamin (MULTIVITAMIN WITH MINERALS) TABS tablet Take 1 tablet by mouth daily. (Patient not taking: Reported on 09/27/2019) 30 tablet 0   omeprazole (PRILOSEC) 20 MG capsule Take 1 capsule (20 mg total) by mouth daily. 30 capsule 2   ondansetron (ZOFRAN) 4 MG tablet Take 1 tablet (4 mg total) by mouth every  6 (six) hours as needed for nausea. 20 tablet 0   polyethylene glycol (MIRALAX / GLYCOLAX) 17 g packet Take 17 g by mouth daily as needed for moderate constipation or severe constipation. (Patient not taking: Reported on 09/27/2019) 14 each 0   potassium chloride (KLOR-CON) 20 MEQ packet Take 40 mEq by mouth daily. (Patient not taking: Reported on 09/27/2019) 60 packet 0   senna-docusate (SENOKOT-S) 8.6-50 MG tablet Take 1 tablet by mouth at bedtime. (Patient not taking: Reported on 09/10/2019) 30 tablet 0   traMADol (ULTRAM) 50 MG tablet Take 1 tablet (50 mg total) by mouth every 8 (eight) hours as needed (mild pain). (Patient not taking: Reported on 09/27/2019) 15 tablet 0   traZODone (DESYREL) 50 MG tablet Take 50 mg by mouth at bedtime as needed for sleep.      No current facility-administered medications for this visit.    REVIEW OF SYSTEMS:   Constitutional: ( - ) fevers, ( - )  chills , ( - ) night sweats Eyes: ( - ) blurriness of vision, ( - ) double vision, ( - ) watery eyes Ears, nose, mouth, throat, and face: ( - ) mucositis, ( - ) sore throat Respiratory: ( - ) cough, ( - ) dyspnea, ( - ) wheezes Cardiovascular: ( - )  palpitation, ( - ) chest discomfort, ( - ) lower extremity swelling Gastrointestinal:  ( - ) nausea, ( - ) heartburn, ( - ) change in bowel habits Skin: ( - ) abnormal skin rashes Lymphatics: ( - ) new lymphadenopathy, ( - ) easy bruising Neurological: ( - ) numbness, ( - ) tingling, ( - ) new weaknesses Behavioral/Psych: ( - ) mood change, ( - ) new changes  All other systems were reviewed with the patient and are negative.  PHYSICAL EXAMINATION: ECOG PERFORMANCE STATUS: 1 - Symptomatic but completely ambulatory  There were no vitals filed for this visit. There were no vitals filed for this visit.  GENERAL: well appearing middle aged Caucasian female, alert, no distress and comfortable SKIN: skin color, texture, turgor are normal, no rashes or significant lesions EYES: conjunctiva are pink and non-injected, sclera clear LUNGS: clear to auscultation and percussion with normal breathing effort HEART: regular rate & rhythm and no murmurs and no lower extremity edema Musculoskeletal: no cyanosis of digits and no clubbing  PSYCH: alert & oriented x 3, fluent speech NEURO: no focal motor/sensory deficits  LABORATORY DATA:  I have reviewed the data as listed CBC Latest Ref Rng & Units 10/04/2019 09/27/2019 09/24/2019  WBC 4.0 - 10.5 K/uL 2.6(L) 2.7(L) 4.4  Hemoglobin 12.0 - 15.0 g/dL 11.7(L) 10.2(L) 9.8(L)  Hematocrit 36 - 46 % 38.2 33.0(L) 30.9(L)  Platelets 150 - 400 K/uL 198 270 268    CMP Latest Ref Rng & Units 10/04/2019 09/27/2019 09/24/2019  Glucose 70 - 99 mg/dL 92 91 100(H)  BUN 6 - 20 mg/dL _0 Creatinine 0.44 - 1.00 mg/dL 0.63 0.61 0.57  Sodium 135 - 145 mmol/L 140 140 138  Potassium 3.5 - 5.1 mmol/L 4.2 4.0 4.4  Chloride 98 - 111 mmol/L 105 104 104  CO2 22 - 32 mmol/L _1 Calcium 8.9 - 10.3 mg/dL 9.8 9.5 9.5  Total Protein 6.5 - 8.1 g/dL 6.7 6.7 6.2(L)  Total Bilirubin 0.3 - 1.2 mg/dL 0.7 0.6 0.7  Alkaline Phos 38 - 126 U/L 148(H) 135(H) 124  AST 15 - 41 U/L 34 24  21  ALT 0 -  44 U/L _0 RADIOGRAPHIC STUDIES: No results found.  ASSESSMENT & PLAN Kelly Roberson 60 y.o. female with medical history significant for AML s/p induction therapy and 1 cycle of consolidation who presents for a follow up visit.  We are happy to have Kelly Roberson return to clinic after having been sent to [facility] for induction chemotherapy for acute myeloid leukemia.  Previously we discussed the nature of comanaged care in the treatment of acute myeloid leukemia.  Comanaged care involves the treatment recommendations and administration of chemotherapy by a specialized academic cancer center with local support here at our community hospital.  We would be happy to provide services including emergency care, medication refills, blood tests, transfusion, and supportive care such as G-CSF in the management of her AML.  Major clinical decisions regarding the treatment of the leukemia itself are deferred to [facility].  We are also able to provide imaging services as well as bone marrow biopsy if required.  On exam today Kelly Roberson appears to be at baseline with continued fatigue.  She is not having any overt signs of bleeding and her labs are stable today with a hemoglobin of approximately 15, platelets of 130, and a normal white blood cell count.  She is not having any signs or symptoms of recurrence at this time.  Per Dr. Tana Felts request we will draw monthly labs here at [facility].  Her next visit with him is on 01/22/2020.  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 Elmo Putt providing recommendations. We are happy to provide blood checks and treatment for urgent issues locally. --patient completed induction and 3 cycles of consolidation therapy. Met with Dr. Elmo Putt to discuss maintenance Onureg therapy and decided against it.   --  Her Hgb and Plt today are well above transfusion threshold. Counts appears stable, will check labs monthly per leukemia team's recommendations.  --continue f/u at [facility] with Dr. Elmo Putt. We will continue to monitor her closely locally. --RTC monthly for blood draws, return to clinic in March 2022 (next visit with Dr. Elmo Putt 01/22/2020)   No orders of the defined types were placed in this encounter.  All questions were answered. The patient knows to call the clinic with any problems, questions or concerns.  A total of more than 30 minutes were spent on this encounter and over half of that time was spent on counseling and coordination of care as outlined above.   Ledell Peoples, MD Department of Hematology/Oncology [place] [facility] Phone: [phone] Pager: [phone] Email: [email or web address]  12/02/2019 10:00 AM

## 2019-12-02 NOTE — Progress Notes (Signed)
Orders only

## 2019-12-03 ENCOUNTER — Telehealth: Payer: Self-pay | Admitting: Hematology and Oncology

## 2019-12-03 NOTE — Telephone Encounter (Signed)
Scheduled per los. Called and left msg. Mailed printout  °

## 2019-12-23 ENCOUNTER — Telehealth: Payer: Self-pay | Admitting: *Deleted

## 2019-12-23 NOTE — Telephone Encounter (Signed)
Received call from patient stating that she will be traveling to [place], [place] for 2 weeks for the holidays.  She has not had Covid vaccines and neither have many of her family members she will be seeing. Advised that that since she is determined to go, she needs to wear a mask when she is around other people and that they should  be wearing masks as well. Advised to limit the number of people she hangs out with as well. Suggested that the people she will be spending the most time with be tested for Covid before she gets there. Advised that should she develop any symptoms, that she go and get tested immediately. She voiced understanding of her significant risks of covid and other URI

## 2019-12-31 ENCOUNTER — Other Ambulatory Visit: Payer: Medicaid Other

## 2020-01-07 ENCOUNTER — Inpatient Hospital Stay: Payer: Medicaid Other | Attending: Hematology and Oncology

## 2020-01-07 ENCOUNTER — Other Ambulatory Visit: Payer: Self-pay

## 2020-01-07 DIAGNOSIS — C9201 Acute myeloblastic leukemia, in remission: Secondary | ICD-10-CM | POA: Diagnosis not present

## 2020-01-07 DIAGNOSIS — D701 Agranulocytosis secondary to cancer chemotherapy: Secondary | ICD-10-CM

## 2020-01-07 LAB — CBC WITH DIFFERENTIAL (CANCER CENTER ONLY)
Abs Immature Granulocytes: 0 10*3/uL (ref 0.00–0.07)
Basophils Absolute: 0.1 10*3/uL (ref 0.0–0.1)
Basophils Relative: 1 %
Eosinophils Absolute: 0.2 10*3/uL (ref 0.0–0.5)
Eosinophils Relative: 4 %
HCT: 42 % (ref 36.0–46.0)
Hemoglobin: 14.6 g/dL (ref 12.0–15.0)
Immature Granulocytes: 0 %
Lymphocytes Relative: 40 %
Lymphs Abs: 1.8 10*3/uL (ref 0.7–4.0)
MCH: 31.9 pg (ref 26.0–34.0)
MCHC: 34.8 g/dL (ref 30.0–36.0)
MCV: 91.7 fL (ref 80.0–100.0)
Monocytes Absolute: 0.5 10*3/uL (ref 0.1–1.0)
Monocytes Relative: 12 %
Neutro Abs: 1.9 10*3/uL (ref 1.7–7.7)
Neutrophils Relative %: 43 %
Platelet Count: 147 10*3/uL — ABNORMAL LOW (ref 150–400)
RBC: 4.58 MIL/uL (ref 3.87–5.11)
RDW: 15.7 % — ABNORMAL HIGH (ref 11.5–15.5)
WBC Count: 4.4 10*3/uL (ref 4.0–10.5)
nRBC: 0 % (ref 0.0–0.2)

## 2020-01-07 LAB — CMP (CANCER CENTER ONLY)
ALT: 19 U/L (ref 0–44)
AST: 19 U/L (ref 15–41)
Albumin: 3.9 g/dL (ref 3.5–5.0)
Alkaline Phosphatase: 99 U/L (ref 38–126)
Anion gap: 7 (ref 5–15)
BUN: 10 mg/dL (ref 8–23)
CO2: 25 mmol/L (ref 22–32)
Calcium: 9.8 mg/dL (ref 8.9–10.3)
Chloride: 105 mmol/L (ref 98–111)
Creatinine: 0.61 mg/dL (ref 0.44–1.00)
GFR, Estimated: >60 mL/min (ref >60)
Glucose, Bld: 94 mg/dL (ref 70–99)
Potassium: 3.7 mmol/L (ref 3.5–5.1)
Sodium: 137 mmol/L (ref 135–145)
Total Bilirubin: 0.7 mg/dL (ref 0.3–1.2)
Total Protein: 6.9 g/dL (ref 6.5–8.1)

## 2020-01-07 LAB — SAMPLE TO BLOOD BANK

## 2020-01-08 ENCOUNTER — Telehealth: Payer: Self-pay | Admitting: *Deleted

## 2020-01-08 NOTE — Telephone Encounter (Signed)
Received call from patient as I had called her earlier in the week to check on her.  She has returned from her trip this week and states she is feeling ok. Denies fever, chills. No cough or sore throat.  Her c/o is fatigue.  She had labs done yesterday. Labs reviewed. All WNL She is requesting to see Dr. Leonides Schanz before March appt.  Advised that I can set her up for MD appt with her February labs.  She is agreeable to that.  Scheduling message sent

## 2020-01-09 ENCOUNTER — Ambulatory Visit: Payer: Medicaid Other | Admitting: Neurology

## 2020-01-09 ENCOUNTER — Telehealth: Payer: Self-pay | Admitting: Neurology

## 2020-01-09 ENCOUNTER — Encounter: Payer: Self-pay | Admitting: Neurology

## 2020-01-09 ENCOUNTER — Telehealth: Payer: Self-pay | Admitting: Hematology and Oncology

## 2020-01-09 VITALS — BP 128/84 | HR 80 | Ht 68.0 in | Wt 184.0 lb

## 2020-01-09 DIAGNOSIS — E538 Deficiency of other specified B group vitamins: Secondary | ICD-10-CM | POA: Diagnosis not present

## 2020-01-09 DIAGNOSIS — G2581 Restless legs syndrome: Secondary | ICD-10-CM | POA: Diagnosis not present

## 2020-01-09 DIAGNOSIS — C9201 Acute myeloblastic leukemia, in remission: Secondary | ICD-10-CM

## 2020-01-09 DIAGNOSIS — G6289 Other specified polyneuropathies: Secondary | ICD-10-CM

## 2020-01-09 HISTORY — DX: Restless legs syndrome: G25.81

## 2020-01-09 MED ORDER — DULOXETINE HCL 30 MG PO CPEP: ORAL_CAPSULE | ORAL | 3 refills | Status: DC

## 2020-01-09 NOTE — Progress Notes (Signed)
Reason for visit: Possible peripheral neuropathy, memory disturbance, chronic insomnia, chronic fatigue  Referring physician: Dr. Levora Dredge is a 60 y.o. female  History of present illness:  Kelly Roberson is a 60 year old right-handed white female with a history of some issues with numbness and tingling in her feet that began around 2018 or possibly even before that.  Concurrent with this, she has had some gradually increasing problems with insomnia and chronic fatigue and some decline in her short-term memory.  The patient may have tingling and severe pain in the feet that is cold sensitive.  The sensation changes have progressed up to the knee level and also involve the hands.  She also feels somewhat weak throughout the arms and legs, she feels that she cannot grasp things well and has difficulty picking things up and has difficulty standing up from a seated position or climbing stairs.  Within the last 2 weeks she has developed some discomfort in the neck and shoulders with pain going up into the back of the head.  She reports muscle cramps in the thighs.  She currently is in physical therapy.  She does report some gait instability, she last fell in August 2021.  She has fractured her left ankle with a fall.  She reports that she has irritable bowel syndrome with diarrhea and fecal incontinence and some urinary urgency and frequency.  She has restless leg syndrome, she is on gabapentin for this in part which has helped some.  She takes 1200 mg gabapentin 3 times daily without complete control of the neuropathy pain.  The patient has lost about 50 pounds of weight after she was diagnosed with AML in April 2021.  The patient is undergone chemotherapy with cytarabine and gentuzumab.  The patient believes that the neuropathy symptoms worsened with the chemotherapy.  She is sent to this office for further evaluation.  Past Medical History:  Diagnosis Date  . Anxiety   . Cancer ([facility])   .  Hypertension   . IBS (irritable bowel syndrome)     Past Surgical History:  Procedure Laterality Date  . GASTRIC BYPASS  2013  . NISSEN FUNDOPLICATION  2009   Reversed in 2013  . ORIF ANKLE FRACTURE Left 06/2017  . PLACEMENT OF BREAST IMPLANTS      Family History  Problem Relation Age of Onset  . Pancreatic cancer Father   . Colon cancer Maternal Aunt   . Diabetes Maternal Uncle   . Pancreatic cancer Maternal Grandfather     Social history:  reports that she has quit smoking. She has never used smokeless tobacco. She reports previous alcohol use. She reports previous drug use.  Medications:  Prior to Admission medications   Medication Sig Start Date End Date Taking? Authorizing Provider  ALPRAZolam Prudy Feeler) 0.5 MG tablet Take 0.5 mg by mouth 2 (two) times daily as needed. 11/26/19  Yes [provider]  folic acid (FOLVITE) 1 MG tablet Take 1 mg by mouth daily. 07/19/19 07/18/20 Yes [provider]  Multiple Vitamin (MULTIVITAMIN WITH MINERALS) TABS tablet Take 1 tablet by mouth daily. 07/04/19  Yes Sheikh, Omair Latif, DO  omeprazole (PRILOSEC) 20 MG capsule Take 1 capsule (20 mg total) by mouth daily. 09/18/19  Yes Rodolph Bong, MD  ondansetron (ZOFRAN) 4 MG tablet Take 1 tablet (4 mg total) by mouth every 6 (six) hours as needed for nausea. 09/18/19  Yes Rodolph Bong, MD  traZODone (DESYREL) 100 MG tablet Take 100 mg  by mouth at bedtime. 11/20/19  Yes [provider]  triamterene-hydrochlorothiazide (MAXZIDE-25) 37.5-25 MG tablet Take 1 tablet by mouth daily. 11/06/19  Yes [provider]  Vitamin D, Ergocalciferol, (DRISDOL) 1.25 MG (50000 UNIT) CAPS capsule Take 50,000 Units by mouth once a week. 10/24/19  Yes [provider]  acyclovir (ZOVIRAX) 400 MG tablet Take 400 mg by mouth 2 (two) times daily. 09/01/19   [provider]  cyclobenzaprine (FLEXERIL) 5 MG tablet Take 1 tablet (5 mg total) by mouth 3 (three) times  daily as needed for muscle spasms. 05/01/19   Shalhoub, Sherryll Burger, MD  diltiazem (CARDIZEM CD) 120 MG 24 hr capsule Take 1 capsule (120 mg total) by mouth daily. 09/19/19   Eugenie Filler, MD  entecavir (BARACLUDE) 0.5 MG tablet Take 0.5 mg by mouth daily.  Patient not taking: Reported on 01/09/2020 06/01/19 05/31/20  [provider]  gabapentin (NEURONTIN) 300 MG capsule Take 3-4 capsules (900-1,200 mg total) by mouth See admin instructions. Take 900mg  twice daily and 1200mg  at bedtime. 09/18/19 10/19/19  Eugenie Filler, MD  magnesium oxide (MAG-OX) 400 (241.3 Mg) MG tablet Take 2 tablets (800 mg total) by mouth 2 (two) times daily. 09/18/19   Eugenie Filler, MD  senna-docusate (SENOKOT-S) 8.6-50 MG tablet Take 1 tablet by mouth at bedtime. 07/03/19   Raiford Noble Latif, DO      Allergies  Allergen Reactions  . Ace Inhibitors     Other reaction(s): Cough  . Lisinopril Cough  . Tylenol [Acetaminophen] Hives and Itching    Pt. States she only itches from tylenol     ROS:  Out of a complete 14 system review of symptoms, the patient complains only of the following symptoms, and all other reviewed systems are negative.  Foot and leg discomfort Fatigue, insomnia Memory problems Gait instability  Blood pressure 128/84, pulse 80, height 5\' 8"  (1.727 m), weight 184 lb (83.5 kg).  Physical Exam  General: The patient is alert and cooperative at the time of the examination.  Eyes: Pupils are equal, round, and reactive to light. Discs are flat bilaterally.  Neck: The neck is supple, no carotid bruits are noted.  Respiratory: The respiratory examination is clear.  Cardiovascular: The cardiovascular examination reveals a regular rate and rhythm, no obvious murmurs or rubs are noted.  Skin: Extremities are without significant edema.  Neurologic Exam  Mental status: The patient is alert and oriented x 3 at the time of the examination. The patient has apparent normal recent  and remote memory, with an apparently normal attention span and concentration ability.  Cranial nerves: Facial symmetry is present. There is good sensation of the face to pinprick and soft touch bilaterally. The strength of the facial muscles and the muscles to head turning and shoulder shrug are normal bilaterally. Speech is well enunciated, no aphasia or dysarthria is noted. Extraocular movements are full. Visual fields are full. The tongue is midline, and the patient has symmetric elevation of the soft palate. No obvious hearing deficits are noted.  Motor: The motor testing reveals 5 over 5 strength of all 4 extremities. Good symmetric motor tone is noted throughout.  Sensory: Sensory testing is intact to pinprick, soft touch, vibration sensation, and position sense on all 4 extremities, with the exception that there is some decrease in vibration sensation and pinprick sensation on the left leg as compared to the right.  No clear evidence of a stocking pattern pinprick sensory deficit is noted in the  legs.  No evidence of extinction is noted.  Coordination: Cerebellar testing reveals good finger-nose-finger and heel-to-shin bilaterally.  Gait and station: Gait is somewhat wide-based, the patient can walk independently.  Tandem gait is unsteady.  Romberg is negative but is slightly unsteady.  The patient is unable to walk on heels or the toes.  She has difficulty arising from a seated position with arms crossed.  Reflexes: Deep tendon reflexes are symmetric and normal bilaterally, with exception of some decrease in ankle jerk reflexes bilaterally. Toes are downgoing bilaterally.   Assessment/Plan:  1.  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  Chronic insomnia, chronic fatigue  3.  Reported memory disturbance  4.  Gait disorder  5.  History of AML  The patient has noted onset of sensory changes in the feet that predated the chemotherapy regimen.  The patient will be sent  for blood work today, she will have nerve conduction studies on both legs and 1 arm, EMG on 1 leg.  We will need to follow the memory issues over time.  The patient will be placed on Cymbalta taking 30 mg daily for 2 weeks and then go to 30 mg twice daily.  The patient will follow up in 4 months.  Jill Alexanders MD 01/09/2020 8:37 AM  [facility] [address] [place], [place] [address]  Phone [phone] Fax [phone]

## 2020-01-09 NOTE — Telephone Encounter (Signed)
R/s appt per 1/5 sch msg - pt is aware of appts.

## 2020-01-09 NOTE — Patient Instructions (Signed)
We will start cymbalta for the neuropathy pain.  Cymbalta (duloxetine) is an antidepressant medication that is commonly used for peripheral neuropathy pain or for fibromyalgia pain. As with any antidepressant medication, worsening depression can be seen. This medication can potentially cause headache, dizziness, sexual dysfunction, or nausea. If any problems are noted on this medication, please contact our office.

## 2020-01-09 NOTE — Telephone Encounter (Signed)
This information is noted.

## 2020-01-09 NOTE — Telephone Encounter (Signed)
FYI: While checking out, pt asked me to let Dr. Anne Hahn know that she's also been having issues with being cold all the time. She states that over the holidays she was in 70 degree weather and still cold with a large jacket on.

## 2020-01-10 ENCOUNTER — Other Ambulatory Visit: Payer: Medicaid Other

## 2020-01-14 LAB — MULTIPLE MYELOMA PANEL, SERUM
Albumin SerPl Elph-Mcnc: 3.9 g/dL (ref 2.9–4.4)
Albumin/Glob SerPl: 1.4 (ref 0.7–1.7)
Alpha 1: 0.2 g/dL (ref 0.0–0.4)
Alpha2 Glob SerPl Elph-Mcnc: 0.7 g/dL (ref 0.4–1.0)
B-Globulin SerPl Elph-Mcnc: 1 g/dL (ref 0.7–1.3)
Gamma Glob SerPl Elph-Mcnc: 0.8 g/dL (ref 0.4–1.8)
Globulin, Total: 2.8 g/dL (ref 2.2–3.9)
IgA/Immunoglobulin A, Serum: 154 mg/dL (ref 87–352)
IgG (Immunoglobin G), Serum: 827 mg/dL (ref 586–1602)
IgM (Immunoglobulin M), Srm: 91 mg/dL (ref 26–217)
Total Protein: 6.7 g/dL (ref 6.0–8.5)

## 2020-01-14 LAB — ENA+DNA/DS+SJORGEN'S
ENA RNP Ab: 1.3 AI — ABNORMAL HIGH (ref 0.0–0.9)
ENA SM Ab Ser-aCnc: <0.2 AI (ref 0.0–0.9)
ENA SSA (RO) Ab: <0.2 AI (ref 0.0–0.9)
ENA SSB (LA) Ab: <0.2 AI (ref 0.0–0.9)
dsDNA Ab: <1 IU/mL (ref 0–9)

## 2020-01-14 LAB — SEDIMENTATION RATE: Sed Rate: 9 mm/hr (ref 0–40)

## 2020-01-14 LAB — HEPATITIS C ANTIBODY: Hep C Virus Ab: 6.2 s/co ratio — ABNORMAL HIGH (ref 0.0–0.9)

## 2020-01-14 LAB — ANA W/REFLEX: Anti Nuclear Antibody (ANA): POSITIVE — AB

## 2020-01-14 LAB — COPPER, SERUM: Copper: 97 ug/dL (ref 80–158)

## 2020-01-14 LAB — ANGIOTENSIN CONVERTING ENZYME: Angio Convert Enzyme: 57 U/L (ref 14–82)

## 2020-01-14 LAB — B. BURGDORFI ANTIBODIES: Lyme IgG/IgM Ab: <0.91 {ISR} (ref 0.00–0.90)

## 2020-01-14 LAB — VITAMIN B12: Vitamin B-12: 379 pg/mL (ref 232–1245)

## 2020-01-14 LAB — CK: Total CK: 44 U/L (ref 32–182)

## 2020-01-16 ENCOUNTER — Telehealth: Payer: Self-pay | Admitting: Neurology

## 2020-01-16 ENCOUNTER — Encounter: Payer: Self-pay | Admitting: Neurology

## 2020-01-16 NOTE — Telephone Encounter (Signed)
I called the patient.  The blood work was notable for positive ANA, but only a single modest elevation of the RNP antibody on the blood work which noted.  This likely means nothing clinically.  The patient apparently has a positive hepatitis C antibody, but she indicates that she was aware of this, she had hepatitis C in her 67s, also has hepatitis B.  She has been seen and evaluated for this in the past.  She has never required treatment for hepatitis.

## 2020-02-03 ENCOUNTER — Encounter: Payer: Self-pay | Admitting: Neurology

## 2020-02-03 ENCOUNTER — Ambulatory Visit (INDEPENDENT_AMBULATORY_CARE_PROVIDER_SITE_OTHER): Payer: Medicaid Other | Admitting: Neurology

## 2020-02-03 ENCOUNTER — Ambulatory Visit: Payer: Medicaid Other | Admitting: Neurology

## 2020-02-03 DIAGNOSIS — R202 Paresthesia of skin: Secondary | ICD-10-CM | POA: Diagnosis not present

## 2020-02-03 DIAGNOSIS — R200 Anesthesia of skin: Secondary | ICD-10-CM

## 2020-02-03 DIAGNOSIS — G6289 Other specified polyneuropathies: Secondary | ICD-10-CM

## 2020-02-03 NOTE — Progress Notes (Addendum)
Patient comes in today for EMG and nerve conduction study evaluation.  Nerve conductions do show what looks like primarily slowing of the motor portion of the nerves, with minimal sensory involvement.  The patient reports left neck and shoulder discomfort and left-sided headaches shortly after starting the Cymbalta, she dropped the dose to 1 a day and the headache has improved.  She will keep it 1 a day for another week and try to go up to 2 days then, if the headache comes back the Cymbalta may be the culprit.  If the neck pain does not go away, we may need to do MRI of the cervical spine.     [place]    Nerve / Sites Muscle Latency Ref. Amplitude Ref. Rel Amp Segments Distance Velocity Ref. Area    ms ms mV mV %  cm m/s m/s mVms  L Median - APB     Wrist APB 3.7 ?4.4 3.6 ?4.0 100 Wrist - APB 7   11.8     Upper arm APB 7.5  3.2  86.8 Upper arm - Wrist 22 58 ?49 9.2  L Ulnar - ADM     Wrist ADM 2.6 ?3.3 5.6 ?6.0 100 Wrist - ADM 7   22.0     B.Elbow ADM 5.7  5.2  92.9 B.Elbow - Wrist 18 58 ?49 20.7     A.Elbow ADM 7.4  4.9  95.4 A.Elbow - B.Elbow 10 58 ?49 20.0  R Peroneal - EDB     Ankle EDB 6.1 ?6.5 1.2 ?2.0 100 Ankle - EDB 9   3.8     Fib head EDB 14.3  0.9  80.6 Fib head - Ankle 27 33 ?44 4.9     Pop fossa EDB 17.3  0.9  99.7 Pop fossa - Fib head 10 33 ?44 4.6         Pop fossa - Ankle      L Peroneal - EDB     Ankle EDB 4.8 ?6.5 1.4 ?2.0 100 Ankle - EDB 9   8.0     Fib head EDB 12.3  1.2  80.3 Fib head - Ankle 28 38 ?44 7.1     Pop fossa EDB 14.9  1.1  94.3 Pop fossa - Fib head 10 38 ?44 5.2         Pop fossa - Ankle      R Tibial - AH     Ankle AH 5.0 ?5.8 2.0 ?4.0 100 Ankle - AH 9   6.5     Pop fossa AH 15.5  1.1  54.2 Pop fossa - Ankle 40 38 ?41 2.8  L Tibial - AH     Ankle AH 4.7 ?5.8 1.0 ?4.0 100 Ankle - AH 9   5.1     Pop fossa AH 15.3  1.0  101 Pop fossa - Ankle 38 36 ?41 6.2                  SNC    Nerve / Sites Rec. Site Peak Lat Ref.  Amp Ref. Segments Distance    ms  ms V V  cm  R Sural - Ankle (Calf)     Calf Ankle NR ?4.4 NR ?6 Calf - Ankle 14  L Sural - Ankle (Calf)     Calf Ankle 3.9 ?4.4 7 ?6 Calf - Ankle 14  R Superficial peroneal - Ankle     Lat leg Ankle NR ?4.4 NR ?6 Lat leg -  Ankle 14  L Superficial peroneal - Ankle     Lat leg Ankle 4.1 ?4.4 6 ?6 Lat leg - Ankle 14  L Median - Orthodromic (Dig II, Mid palm)     Dig II Wrist 3.4 ?3.4 14 ?10 Dig II - Wrist 13  L Ulnar - Orthodromic, (Dig V, Mid palm)     Dig V Wrist 3.0 ?3.1 8 ?5 Dig V - Wrist 77                 F  Wave    Nerve F Lat Ref.   ms ms  R Tibial - AH 65.9 ?56.0  L Tibial - AH 63.0 ?56.0  L Ulnar - ADM 26.8 ?32.0

## 2020-02-03 NOTE — Procedures (Signed)
     HISTORY:  Kelly Roberson is a 60 year old patient with history of AML and hepatitis C.  The patient has had some numbness and paresthesias of the feet and lower legs and the hands that predated the diagnosis of AML.  She is being evaluated for possible neuropathy.  NERVE CONDUCTION STUDIES:  Nerve conduction studies were performed on the left upper extremity.  The distal motor latencies for the left median and ulnar nerves were normal with low motor amplitudes seen for these nerves.  The nerve conduction velocities for the left median and ulnar nerves were normal with normal sensory latencies for these nerves.  The left ulnar F-wave latency was normal.  Nerve conduction studies were performed on both lower extremities.  The distal motor latencies for the peroneal and posterior tibial nerves were normal bilaterally with low motor amplitudes seen for these nerves bilaterally.  Slowing was seen for the peroneal and posterior tibial nerves bilaterally with absence of the right sural and peroneal sensory latencies, normal on the left side.  The F-wave latencies for the posterior tibial nerves were prolonged bilaterally.  EMG STUDIES:  EMG study was performed on the left lower extremity:  The tibialis anterior muscle reveals 2 to 4K motor units with full recruitment. No fibrillations or positive waves were seen. The peroneus tertius muscle reveals 2 to 4K motor units with full recruitment. No fibrillations or positive waves were seen. The medial gastrocnemius muscle reveals 1 to 3K motor units with full recruitment. No fibrillations or positive waves were seen. The vastus lateralis muscle reveals 2 to 4K motor units with full recruitment. No fibrillations or positive waves were seen. The iliopsoas muscle reveals 2 to 4K motor units with full recruitment. No fibrillations or positive waves were seen. The biceps femoris muscle (long head) reveals 2 to 4K motor units with full recruitment. No  fibrillations or positive waves were seen. The lumbosacral paraspinal muscles were tested at 3 levels, and revealed no abnormalities of insertional activity at all 3 levels tested. There was good relaxation.   IMPRESSION:  Nerve conduction studies done on the left upper and both lower extremities show diffusely low motor amplitudes with motor slowing seen in the lower extremities bilaterally with some sensory involvement consistent with a peripheral neuropathy.  EMG evaluation of the left lower extremity shows no significant abnormalities and no evidence of an overlying lumbar radiculopathy.  Jill Alexanders MD 02/03/2020 2:05 PM  [facility] [address] [place], [place] [address]  Phone [phone] Fax [phone]

## 2020-02-03 NOTE — Progress Notes (Signed)
Please refer to EMG and nerve conduction procedure note.  

## 2020-02-06 ENCOUNTER — Telehealth: Payer: Self-pay | Admitting: Neurology

## 2020-02-06 DIAGNOSIS — R200 Anesthesia of skin: Secondary | ICD-10-CM

## 2020-02-06 NOTE — Addendum Note (Signed)
Addended by: Kathrynn Ducking on: 02/06/2020 05:49 PM   Modules accepted: Orders

## 2020-02-06 NOTE — Telephone Encounter (Signed)
I called the patient.  The patient over the last 3 days has developed discomfort in the lower jaw on the right, it does not hurt to chew, but she has developed some numbness in the lower jaw to include the right side of the chin and lower lip.  She is not running any fevers or chills.  Hot and cold foods do not bother her.  She will be seeing her dentist, we need to exclude a dental abscess that is involving the inferior alveolar nerve.  Given her history of cancer, I will get MRI of the brain with and without contrast.

## 2020-02-06 NOTE — Telephone Encounter (Signed)
Pt having numbness in her chin on right side, her lips are also feeling numb.  Pt also having an aching feeling in her bones.  Pt is asking for a call to discuss.

## 2020-02-10 ENCOUNTER — Telehealth: Payer: Self-pay | Admitting: Neurology

## 2020-02-10 NOTE — Telephone Encounter (Signed)
Pt is asking for a call to request medication for the pain she is in

## 2020-02-10 NOTE — Telephone Encounter (Signed)
[facility] pending faxed notes.  °

## 2020-02-11 ENCOUNTER — Other Ambulatory Visit: Payer: Self-pay | Admitting: Neurology

## 2020-02-11 MED ORDER — TOPIRAMATE 25 MG PO TABS: 25.0000 mg | ORAL_TABLET | Freq: Two times a day (BID) | ORAL | 3 refills | Status: DC

## 2020-02-11 NOTE — Telephone Encounter (Signed)
Called patient and she stated she is having facial pain, starting at center of chin going all way back to right side, bottom of lip is numb and where her jaw goes into her cheekbone is throbbing.  Is keeping her awake at night.  She is using the gabapentin, has taken a couple extra doses which has somewhat helped.  Using Xanax along with methocarbamol (prescribed by Dr. Ronnald Ramp on 01/31/2020).  She said her teeth are also hurting.  Patient is aware Dr. Jannifer Franklin is not in and will send to [facility]).

## 2020-02-11 NOTE — Telephone Encounter (Signed)
I called and spoke to the patient.  She complains of what looks like a neuralgic pain starting in the middle of the chin and going to the back of the face.  She is already on gabapentin 900 mg twice daily and 1200 at night.  Recommend she try addition of Topamax 25 twice daily and call back in a few days to let us know how she is doing.  She voiced understanding.

## 2020-02-13 ENCOUNTER — Telehealth: Payer: Self-pay | Admitting: *Deleted

## 2020-02-13 NOTE — Telephone Encounter (Signed)
Received call from patient. She is asking to cancel next week's appts as she will be seen at [facility] on 02/17/20. She saw Dr. Elmo Putt yesterday.  She states she  Has developed some concerning new symptoms-headache, jaw, teeth and gum pain and then numbness on the right side of her chin and lips.  She had seen Dr. Jannifer Franklin as well. Dr. Elmo Putt has ordered Brain MRI and a lumbar puncture for 02/17/20. Her appt here was for 02/19/20  Advised that I will cancel the appts on 02/19/20 but that I will call her later that week to see how she is doing.  [name] made aware

## 2020-02-13 NOTE — Telephone Encounter (Signed)
[facility] auth: [id number] (exp. 02/10/20 to 04/10/20) order faxed to [facility] that is who is in the patient network w/her plan they will reach out to the pt to schedule

## 2020-02-13 NOTE — Telephone Encounter (Signed)
Kelly Roberson with [facility] messaged me stating "Reached out to patient, she states she saw her oncologist and they have scheduled her an urgent mri there due to his thoughts of it being leukemia.  We did not schedule. "

## 2020-02-14 ENCOUNTER — Other Ambulatory Visit: Payer: Self-pay | Admitting: Neurology

## 2020-02-14 ENCOUNTER — Telehealth: Payer: Self-pay | Admitting: Neurology

## 2020-02-14 DIAGNOSIS — R519 Headache, unspecified: Secondary | ICD-10-CM | POA: Insufficient documentation

## 2020-02-14 MED ORDER — GABAPENTIN 300 MG PO CAPS: 600.0000 mg | ORAL_CAPSULE | Freq: Three times a day (TID) | ORAL | 0 refills | Status: DC

## 2020-02-14 MED ORDER — UBRELVY 50 MG PO TABS: 50.0000 mg | ORAL_TABLET | ORAL | 0 refills | Status: DC | PRN

## 2020-02-14 NOTE — Telephone Encounter (Signed)
Meds ordered this encounter  Medications  . UBRELVY 50 MG TABS    Sig: TAKE 50 MG BY MOUTH AS NEEDED.    Dispense:  10 tablet    Refill:  0

## 2020-02-14 NOTE — Telephone Encounter (Addendum)
Received phone call from patient complains of severe headache,  Has history of allergy to Tylenol, could not tolerate NSAIDs, aspirin due to GI side effect,  Previously tried tramadol without helping her headaches,  Currently she is receiving chemotherapy for AML at [facility], pending appointment with [facility] on February 17, 2020  I have gave her higher dose of gabapentin up to 300 mg 2 tablets 3 times a day, Ubrelvy 50 mg as needed  She later called multiple times, Ubrelvy  need preauthorization

## 2020-02-17 NOTE — Telephone Encounter (Signed)
Kelly Roberson- this was routed to me as well. This is a Dr. Jannifer Franklin pt, thank you

## 2020-02-18 NOTE — Telephone Encounter (Signed)
I have submitted a PA request for Ubrelvy 50mg  on CMM, Key: [id number].   Awaiting determination from [facility]

## 2020-02-19 ENCOUNTER — Other Ambulatory Visit: Payer: Medicaid Other

## 2020-02-19 ENCOUNTER — Ambulatory Visit: Payer: Medicaid Other | Admitting: Hematology and Oncology

## 2020-02-19 NOTE — Telephone Encounter (Signed)
I would agree that the diagnosis of migraine has not been confirmed.  MRI of the brain is pending.  Not sure I would pursue getting Ubrelvy for this patient.

## 2020-02-19 NOTE — Telephone Encounter (Signed)
Denied  Criteria not met:    -documentation stating beneficiary has a diagnosis of migraine, w/wo aura;   -documentation stating beneficiary does NOT have a headache frequency of 15 or more headache days per month during the prior 6 months;   -documentation stating beneficiary is NOT concurrently using a strong CYP3A4 inhibitor;   -documentation stating beneficiary does NOT have ESRD;   -documentation stating beneficiary has tried and failed, or have contraindication to, 2 or more preferred triptans.  PA# [id number] Appeals can be faxed to [phone] or you can call [phone].

## 2020-02-24 ENCOUNTER — Telehealth: Payer: Self-pay | Admitting: *Deleted

## 2020-02-24 NOTE — Telephone Encounter (Signed)
TCT patient.  Spoke with her briefly. She is currently an in patient @ [place] for relapse of her  AML.  Unclear about discharge plans. Pt sounds very discouraged while trying to do PT/OT and overall outlook.  Emotional support extended. Told her I would call her later in the week.  [name] made aware of her current status.

## 2020-03-30 ENCOUNTER — Ambulatory Visit: Payer: Medicaid Other | Admitting: Hematology and Oncology

## 2020-03-30 ENCOUNTER — Other Ambulatory Visit: Payer: Medicaid Other

## 2020-03-30 ENCOUNTER — Inpatient Hospital Stay: Payer: Medicaid Other | Admitting: Hematology and Oncology

## 2020-03-30 ENCOUNTER — Inpatient Hospital Stay: Payer: Medicaid Other | Attending: Hematology and Oncology

## 2020-03-30 ENCOUNTER — Telehealth: Payer: Self-pay | Admitting: *Deleted

## 2020-03-30 NOTE — Telephone Encounter (Signed)
TCT patient regarding her appt that was scheduled for today. Called to check on her. No answer but was able to leave vm message on an identified phone #  Asked her to call back at her convenience @ [phone]

## 2020-04-13 ENCOUNTER — Telehealth: Payer: Self-pay | Admitting: *Deleted

## 2020-04-13 NOTE — Telephone Encounter (Signed)
Received call from patient x 3 today. She is asking for [name] to manage her 'maintenance care'  At the time of her discharge. She states she has been in the hospital 2 [facility] for 55 days per patient.  She seems a bit puzzled as to why she has been in the hospital so long and seems unclear about her discharge plans/ discharge date.   Advised that we had not heard from Dr. Elmo Putt as to her ongoing care needs here in [place]. Call lost all 3 times.  Call  Call made to Dr. Tana Felts office and to Derinda Late, CM in the past for patient  Dr. Tana Felts # [phone] Derinda Late, [place]  # [phone]

## 2020-04-14 ENCOUNTER — Other Ambulatory Visit: Payer: Self-pay | Admitting: *Deleted

## 2020-04-14 ENCOUNTER — Telehealth: Payer: Self-pay | Admitting: Hematology and Oncology

## 2020-04-14 DIAGNOSIS — C9201 Acute myeloblastic leukemia, in remission: Secondary | ICD-10-CM

## 2020-04-14 NOTE — Telephone Encounter (Signed)
Scheduled appts per 4/12 sch msg. Pt aware.  

## 2020-04-15 ENCOUNTER — Other Ambulatory Visit: Payer: Self-pay | Admitting: *Deleted

## 2020-04-15 DIAGNOSIS — C9201 Acute myeloblastic leukemia, in remission: Secondary | ICD-10-CM

## 2020-04-16 ENCOUNTER — Emergency Department (HOSPITAL_COMMUNITY): Payer: Medicaid Other

## 2020-04-16 ENCOUNTER — Telehealth: Payer: Self-pay | Admitting: *Deleted

## 2020-04-16 ENCOUNTER — Emergency Department (HOSPITAL_COMMUNITY)
Admission: EM | Admit: 2020-04-16 | Discharge: 2020-04-16 | Discharge status: Against Medical Advice | Payer: Medicaid Other | Attending: Emergency Medicine | Admitting: Emergency Medicine

## 2020-04-16 ENCOUNTER — Telehealth: Payer: Self-pay | Admitting: Hematology and Oncology

## 2020-04-16 ENCOUNTER — Other Ambulatory Visit: Payer: Self-pay

## 2020-04-16 DIAGNOSIS — M25521 Pain in right elbow: Secondary | ICD-10-CM | POA: Diagnosis not present

## 2020-04-16 DIAGNOSIS — Z87891 Personal history of nicotine dependence: Secondary | ICD-10-CM | POA: Insufficient documentation

## 2020-04-16 DIAGNOSIS — W1839XA Other fall on same level, initial encounter: Secondary | ICD-10-CM | POA: Insufficient documentation

## 2020-04-16 DIAGNOSIS — W19XXXA Unspecified fall, initial encounter: Secondary | ICD-10-CM

## 2020-04-16 DIAGNOSIS — R42 Dizziness and giddiness: Secondary | ICD-10-CM | POA: Diagnosis not present

## 2020-04-16 DIAGNOSIS — S0591XA Unspecified injury of right eye and orbit, initial encounter: Secondary | ICD-10-CM | POA: Diagnosis present

## 2020-04-16 DIAGNOSIS — Z79899 Other long term (current) drug therapy: Secondary | ICD-10-CM | POA: Insufficient documentation

## 2020-04-16 DIAGNOSIS — S0011XA Contusion of right eyelid and periocular area, initial encounter: Secondary | ICD-10-CM | POA: Insufficient documentation

## 2020-04-16 DIAGNOSIS — Z85828 Personal history of other malignant neoplasm of skin: Secondary | ICD-10-CM | POA: Diagnosis not present

## 2020-04-16 DIAGNOSIS — S40011A Contusion of right shoulder, initial encounter: Secondary | ICD-10-CM | POA: Insufficient documentation

## 2020-04-16 DIAGNOSIS — I10 Essential (primary) hypertension: Secondary | ICD-10-CM | POA: Insufficient documentation

## 2020-04-16 DIAGNOSIS — S5011XA Contusion of right forearm, initial encounter: Secondary | ICD-10-CM | POA: Diagnosis not present

## 2020-04-16 DIAGNOSIS — S0990XA Unspecified injury of head, initial encounter: Secondary | ICD-10-CM

## 2020-04-16 DIAGNOSIS — D61818 Other pancytopenia: Secondary | ICD-10-CM

## 2020-04-16 LAB — CBC WITH DIFFERENTIAL/PLATELET
Abs Immature Granulocytes: 0 10*3/uL (ref 0.00–0.07)
Basophils Absolute: 0 10*3/uL (ref 0.0–0.1)
Basophils Relative: 0 %
Eosinophils Absolute: 0 10*3/uL (ref 0.0–0.5)
Eosinophils Relative: 0 %
HCT: 25.6 % — ABNORMAL LOW (ref 36.0–46.0)
Hemoglobin: 8.4 g/dL — ABNORMAL LOW (ref 12.0–15.0)
Lymphocytes Relative: 60 %
Lymphs Abs: 0.4 10*3/uL — ABNORMAL LOW (ref 0.7–4.0)
MCH: 31.3 pg (ref 26.0–34.0)
MCHC: 32.8 g/dL (ref 30.0–36.0)
MCV: 95.5 fL (ref 80.0–100.0)
Monocytes Absolute: 0 10*3/uL — ABNORMAL LOW (ref 0.1–1.0)
Monocytes Relative: 6 %
Neutro Abs: 0.2 10*3/uL — CL (ref 1.7–7.7)
Neutrophils Relative %: 34 %
Platelets: 19 10*3/uL — CL (ref 150–400)
RBC: 2.68 MIL/uL — ABNORMAL LOW (ref 3.87–5.11)
RDW: 15.5 % (ref 11.5–15.5)
WBC: 0.7 10*3/uL — CL (ref 4.0–10.5)
nRBC: 0 % (ref 0.0–0.2)
nRBC: 1 /100 WBC — ABNORMAL HIGH

## 2020-04-16 LAB — COMPREHENSIVE METABOLIC PANEL
ALT: 16 U/L (ref 0–44)
AST: 23 U/L (ref 15–41)
Albumin: 2.7 g/dL — ABNORMAL LOW (ref 3.5–5.0)
Alkaline Phosphatase: 205 U/L — ABNORMAL HIGH (ref 38–126)
Anion gap: 7 (ref 5–15)
BUN: 6 mg/dL — ABNORMAL LOW (ref 8–23)
CO2: 28 mmol/L (ref 22–32)
Calcium: 8.5 mg/dL — ABNORMAL LOW (ref 8.9–10.3)
Chloride: 104 mmol/L (ref 98–111)
Creatinine, Ser: 0.68 mg/dL (ref 0.44–1.00)
GFR, Estimated: >60 mL/min (ref >60)
Glucose, Bld: 101 mg/dL — ABNORMAL HIGH (ref 70–99)
Potassium: 3.8 mmol/L (ref 3.5–5.1)
Sodium: 139 mmol/L (ref 135–145)
Total Bilirubin: 1.1 mg/dL (ref 0.3–1.2)
Total Protein: 5.4 g/dL — ABNORMAL LOW (ref 6.5–8.1)

## 2020-04-16 NOTE — ED Notes (Signed)
Pt able to ambulate in room and down hall and back with walker without obvious distress or need of additional assistance.

## 2020-04-16 NOTE — ED Provider Notes (Cosign Needed)
[place] EMERGENCY DEPARTMENT Provider Note   CSN: [id number] Arrival date & time: 04/16/20  1246     History Chief Complaint  Patient presents with  . Fall    Kelly Roberson is a 60 y.o. female.  HPI      Kelly Roberson is a 60 y.o. female, with a history of anxiety, AML, presenting to the ED for evaluation following a fall that occurred last night.  Patient states she was discharged last night from [facility] after a 58-day stay due to complications from her leukemia.  She went to bed around 11 PM and sometime during the night she got out of bed, felt lightheaded, and fell to the floor.  She is not sure whether or not she passed out. She states she does not currently feel lightheaded. She complains of some pain to the right eyebrow as well as the right shoulder. She is quite insistent that she does not want to be admitted.  Denies fever, neck/back pain, chest pain, shortness of breath, abdominal pain, numbness, focal weakness, nausea/vomiting, or any other complaints.   Past Medical History:  Diagnosis Date  . Anxiety   . Cancer ([place])   . Hepatitis B   . Hepatitis C   . Hypertension   . IBS (irritable bowel syndrome)   . RLS (restless legs syndrome) 01/09/2020    Patient Active Problem List   Diagnosis Date Noted  . Intractable headache 02/14/2020  . RLS (restless legs syndrome) 01/09/2020  . Neutropenic fever ([place])   . Thrombocytopenia ([place])   . Sepsis ([place]) 09/10/2019  . Severe sepsis ([place]) 09/09/2019  . Typhlitis   . Colitis 07/29/2019  . Hypokalemia 07/29/2019  . Pancytopenia ([place]) 07/29/2019  . Antineoplastic chemotherapy induced pancytopenia ([place]) 07/01/2019  . Pancytopenia due to antineoplastic chemotherapy ([place]) 07/01/2019  . Headache 07/01/2019  . Neutropenia ([place]) 07/01/2019  . Macrocytic anemia 05/01/2019  . AML (acute myeloblastic leukemia) ([place]) 05/01/2019  . Neck pain on right side 05/01/2019  . Leukocytosis 05/01/2019  .  Bicytopenia 04/30/2019  . Macrocytosis without anemia 02/12/2019  . Family history of hemochromatosis 02/12/2019  . Mixed hyperlipidemia 01/18/2019  . Abnormal LFTs 01/18/2019  . Dandruff in adult 01/18/2019  . Hair loss 01/18/2019  . Muscle weakness 01/18/2019  . Peripheral neuropathy 01/18/2019  . Other fatigue 01/18/2019  . Essential hypertension 06/01/2018  . Palpitations 06/01/2018  . Anxiety disorder 06/01/2018  . Insomnia 06/01/2018  . Irritable bowel syndrome with diarrhea 06/01/2018  . Barrett's esophagus with dysplasia 06/01/2018    Past Surgical History:  Procedure Laterality Date  . GASTRIC BYPASS  2013  . NISSEN FUNDOPLICATION  2297   Reversed in 2013  . ORIF ANKLE FRACTURE Left 06/2017  . PLACEMENT OF BREAST IMPLANTS       OB History   No obstetric history on file.     Family History  Problem Relation Age of Onset  . Pancreatic cancer Father   . Colon cancer Maternal Aunt   . Diabetes Maternal Uncle   . Pancreatic cancer Maternal Grandfather     Social History   Tobacco Use  . Smoking status: Former Research scientist (life sciences)  . Smokeless tobacco: Never Used  Substance Use Topics  . Alcohol use: Not Currently    Comment: occasionally   . Drug use: Not Currently    Home Medications Prior to Admission medications   Medication Sig Start Date End Date Taking? Authorizing Provider  ALPRAZolam Duanne Moron) 0.5 MG tablet Take 0.5 mg by mouth  2 (two) times daily as needed. 11/26/19   [provider]  diltiazem (CARDIZEM CD) 120 MG 24 hr capsule Take 1 capsule (120 mg total) by mouth daily. 09/19/19   Eugenie Filler, MD  DULoxetine (CYMBALTA) 30 MG capsule One tablet daily for 2 weeks, then take 1 tablet twice a day 01/09/20   Kathrynn Ducking, MD  folic acid (FOLVITE) 1 MG tablet Take 1 mg by mouth daily. 07/19/19 07/18/20  [provider]  gabapentin (NEURONTIN) 300 MG capsule Take 2 capsules (600 mg total) by mouth 3 (three) times daily. Take 900mg  twice  daily and 1200mg  at bedtime. 02/14/20 03/16/20  Marcial Pacas, MD  Multiple Vitamin (MULTIVITAMIN WITH MINERALS) TABS tablet Take 1 tablet by mouth daily. 07/04/19   Raiford Noble Latif, DO  omeprazole (PRILOSEC) 20 MG capsule Take 1 capsule (20 mg total) by mouth daily. 09/18/19   Eugenie Filler, MD  ondansetron (ZOFRAN) 4 MG tablet Take 1 tablet (4 mg total) by mouth every 6 (six) hours as needed for nausea. 09/18/19   Eugenie Filler, MD  topiramate (TOPAMAX) 25 MG tablet Take 1 tablet (25 mg total) by mouth 2 (two) times daily. 02/11/20   Garvin Fila, MD  traZODone (DESYREL) 100 MG tablet Take 100 mg by mouth at bedtime. 11/20/19   [provider]  triamterene-hydrochlorothiazide (MAXZIDE-25) 37.5-25 MG tablet Take 1 tablet by mouth daily. 11/06/19   [provider]  UBRELVY 50 MG TABS TAKE 50 MG BY MOUTH AS NEEDED. 02/14/20   Marcial Pacas, MD  Vitamin D, Ergocalciferol, (DRISDOL) 1.25 MG (50000 UNIT) CAPS capsule Take 50,000 Units by mouth once a week. 10/24/19   [provider]    Allergies    Ace inhibitors, Lisinopril, and Tylenol [acetaminophen]  Review of Systems   Review of Systems  Constitutional: Negative for chills, diaphoresis and fever.  Respiratory: Negative for cough and shortness of breath.   Cardiovascular: Negative for chest pain.  Gastrointestinal: Negative for abdominal pain, diarrhea, nausea and vomiting.  Musculoskeletal: Negative for back pain and neck pain.  Neurological: Positive for light-headedness. Negative for weakness.  All other systems reviewed and are negative.   Physical Exam Updated Vital Signs BP 119/72   Pulse 85   Temp 98.7 F (37.1 C) (Oral)   Resp 20   SpO2 96%   Physical Exam Vitals and nursing note reviewed.  Constitutional:      General: She is not in acute distress.    Appearance: She is well-developed. She is not diaphoretic.  HENT:     Head: Normocephalic.     Comments: Small abrasion with small area of  surrounding swelling and bruising to the right eyebrow.  No noted deformity or instability. The rest of the face and scalp was examined without other evidence of injury.    Mouth/Throat:     Mouth: Mucous membranes are moist.     Pharynx: Oropharynx is clear.  Eyes:     Conjunctiva/sclera: Conjunctivae normal.  Cardiovascular:     Rate and Rhythm: Normal rate and regular rhythm.     Pulses: Normal pulses.          Radial pulses are 2+ on the right side and 2+ on the left side.       Posterior tibial pulses are 2+ on the right side and 2+ on the left side.     Heart sounds: Normal heart sounds.     Comments: Tactile temperature in the extremities appropriate and  equal bilaterally. Pulmonary:     Effort: Pulmonary effort is normal. No respiratory distress.     Breath sounds: Normal breath sounds.  Abdominal:     Palpations: Abdomen is soft.     Tenderness: There is no abdominal tenderness. There is no guarding.  Musculoskeletal:     Cervical back: Normal range of motion and neck supple. No tenderness.     Right lower leg: No edema.     Left lower leg: No edema.     Comments: Tenderness of bruising to the right shoulder without noted deformity or instability. Patient also has bruising to the right humeral region as well as the right forearm.  She states she thinks some of this bruising is due to phlebotomy and IVs. Full range of motion without noted difficulty in the right elbow and wrist. Normal motor function intact in all extremities.  The major joints of the other upper and lower extremities were palpated and ranged without noted deformity, instability, pain, or tenderness.  No midline spinal tenderness.   Overall trauma exam performed without any abnormalities noted other than those mentioned.  Skin:    General: Skin is warm and dry.  Neurological:     Mental Status: She is alert.     Comments: No noted acute cognitive deficit. Sensation grossly intact to light touch in the  extremities.   Grip strengths equal bilaterally.   Strength 5/5 in all extremities.  Coordination intact.  Cranial nerves III-XII grossly intact.  Handles oral secretions without noted difficulty.  No noted phonation or speech deficit. No facial droop.   Psychiatric:        Mood and Affect: Mood and affect normal.        Speech: Speech normal.        Behavior: Behavior normal.     ED Results / Procedures / Treatments   Labs (all labs ordered are listed, but only abnormal results are displayed) Labs Reviewed  CBC WITH DIFFERENTIAL/PLATELET - Abnormal; Notable for the following components:      Result Value   WBC 0.7 (*)    RBC 2.68 (*)    Hemoglobin 8.4 (*)    HCT 25.6 (*)    Platelets 19 (*)    Neutro Abs 0.2 (*)    Lymphs Abs 0.4 (*)    Monocytes Absolute 0.0 (*)    nRBC 1 (*)    All other components within normal limits  COMPREHENSIVE METABOLIC PANEL - Abnormal; Notable for the following components:   Glucose, Bld 101 (*)    BUN 6 (*)    Calcium 8.5 (*)    Total Protein 5.4 (*)    Albumin 2.7 (*)    Alkaline Phosphatase 205 (*)    All other components within normal limits    EKG EKG Interpretation  Date/Time:  Thursday April 16 2020 13:14:27 EDT Ventricular Rate:  87 PR Interval:  136 QRS Duration: 76 QT Interval:  356 QTC Calculation: 428 R Axis:   29 Text Interpretation: Normal sinus rhythm Low voltage QRS Borderline ECG When compared to prior, now sinus rhythm. No STEMI Confirmed by Antony Blackbird [phone]) on 04/16/2020 3:10:47 PM   Radiology CT Head Wo Contrast  Result Date: 04/16/2020 CLINICAL DATA:  Fall, hematoma to right eyebrow EXAM: CT HEAD WITHOUT CONTRAST TECHNIQUE: Contiguous axial images were obtained from the base of the skull through the vertex without intravenous contrast. COMPARISON:  None. FINDINGS: Brain: No evidence of acute infarction, hemorrhage, hydrocephalus, extra-axial collection or mass  lesion/mass effect. Vascular: No hyperdense  vessel or unexpected calcification. Skull: Normal. Negative for fracture or focal lesion. Sinuses/Orbits: No acute finding. Other: Soft tissue contusion of the right brow (series 3, image 5). IMPRESSION: 1. No acute intracranial pathology. 2. Soft tissue contusion of the right brow. Electronically Signed   By: Eddie Candle M.D.   On: 04/16/2020 15:06    Procedures Procedures   Medications Ordered in ED Medications - No data to display  ED Course  I have reviewed the triage vital signs and the nursing notes.  Pertinent labs & imaging results that were available during my care of the patient were reviewed by me and considered in my medical decision making (see chart for details).  Clinical Course as of 04/16/20 1621  Thu Apr 16, 2020  1521 Platelets(!!): 19 Noted to be 21 yesterday. [SJ]  1521 WBC(!!): 0.7 Noted to be 1.3 yesterday. [SJ]  1521 Hemoglobin(!): 8.4 Noted to be 9.0 yesterday. [SJ]    Clinical Course User Index [SJ] Makelle Marrone, Helane Gunther, PA-C   MDM Rules/Calculators/A&P                          Patient presents for evaluation following a fall that occurred sometime last night. Patient is nontoxic appearing, afebrile, not tachycardic, not tachypneic, not hypotensive, maintains excellent SPO2 on room air, and is in no apparent distress.   I have reviewed the patient's chart to obtain more information.   I reviewed and interpreted the patient's labs and radiological studies.   End of shift patient care handoff report given to [facility], PA-C. Plan: Follow-up on imaging, reassess patient.  Findings and plan of care discussed with attending physician, Antony Blackbird, MD.   Vitals:   04/16/20 1530 04/16/20 1545 04/16/20 1600 04/16/20 1615  BP: 126/69 135/79 (!) 116/58 121/65  Pulse: 79 87 80 88  Resp: 15  (!) 22 20  Temp:      TempSrc:      SpO2: 93% 97% 93% 92%     Final Clinical Impression(s) / ED Diagnoses Final diagnoses:  None    Rx / DC Orders ED  Discharge Orders    None       Lorayne Bender, PA-C 04/16/20 1643

## 2020-04-16 NOTE — ED Provider Notes (Signed)
Physical Exam  BP 119/72   Pulse 85   Temp 98.7 F (37.1 C) (Oral)   Resp 20   SpO2 96%   Physical Exam Vitals and nursing note reviewed.  Constitutional:      General: She is not in acute distress.    Appearance: She is well-developed. She is not diaphoretic.  HENT:     Head: Normocephalic and atraumatic.  Eyes:     General: No scleral icterus.    Conjunctiva/sclera: Conjunctivae normal.  Pulmonary:     Effort: Pulmonary effort is normal. No respiratory distress.  Musculoskeletal:     Cervical back: Normal range of motion.  Skin:    Findings: No rash.  Neurological:     Mental Status: She is alert.     ED Course/Procedures   Clinical Course as of 04/16/20 1555  Thu Apr 16, 2020  1521 Platelets(!!): 19 Noted to be 21 yesterday. [SJ]  1521 WBC(!!): 0.7 Noted to be 1.3 yesterday. [SJ]  1521 Hemoglobin(!): 8.4 Noted to be 9.0 yesterday. [SJ]    Clinical Course User Index [SJ] Joy, Shawn C, PA-C    Procedures  MDM   Care of patient assumed from [place] Joy at 3:30 PM.  Agree with history, physical exam and plan.  See their note for further details.  Briefly, 60 y.o. female with PMH/PSH as below who presents with a chief complaint of fall.  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  Felt lightheaded and fell to the floor last night and is unsure if she had loss of consciousness.  Was found by her son this morning.  Complaining of right shoulder pain and pain to the right eyebrow area.  Lab work here shows a platelet count of 19 which is down from 21 at her discharge yesterday. Leukopenia at 0.7 which is down from ~1 at discharge.  Hemoglobin noted to be 9 yesterday and today is 8.4. CT of the head without any abnormal findings.  CMP is unremarkable  Past Medical History:  Diagnosis Date  . Anxiety   . Cancer ([place])   . Hepatitis B   . Hepatitis C   . Hypertension   . IBS (irritable bowel syndrome)   . RLS (restless legs  syndrome) 01/09/2020   Past Surgical History:  Procedure Laterality Date  . GASTRIC BYPASS  2013  . NISSEN FUNDOPLICATION  2595   Reversed in 2013  . ORIF ANKLE FRACTURE Left 06/2017  . PLACEMENT OF BREAST IMPLANTS        Current Plan: Obtain x-rays of the right upper extremity and reassess after ambulation   MDM/ED Course: 4:00 PM X-rays without any abnormalities.  I spoke to the patient's son, Darnelle Maffucci at the bedside.  Explained to me that they believe this fall was caused by her pain medication regimen as well as her other medications.  He does not feel that she is able to take care of herself at home but they did arrange for home health when she was discharged with [facility] yesterday.  She is scheduled to see an oncologist at the [facility] cancer center tomorrow for lab work.  They are wanting to transfer her care here. Patient states that she feels fine and does not want to be admitted.  5:15 PM Patient ambulated here with walker without additional assistance needed.  6:09 PM Spoke to son again.  I informed patient that she will need to be admitted due to her worsening labs as  well as her comorbidities and risk of injuries if she falls again at home. Patient is adamant that she does not want to stay in the hospital.  Stating that she would like to go home.  I informed the son of this and though they are in the process of becoming her healthcare power of attorney's, there is no official documentation of this.  He told me that [facility] deemed that she was "not able to make her own medical decisions" but that nothing was official. I told him that while I do strongly feel that patient requires admission today, I am unable to force her to stay here in the hospital against her will, as she is of sound mind and is her own POA at this point. I understood the frustration and explained to the patient multiple times that it would be best if she be admitted here but she continues to refuse and states that she wants  to be discharged home. Son then tells me that they are in the process of finding her placement in a SNF? And speaking to an attorney about transferring POA.  6:49 PM Son spoke to patient over the phone.  I reviewed chart once again and there was a note that the sons would provide 24-hour care for the next 7 days for patient when she was discharged yesterday. She questioned them about this, states that one of her sons had left and then "felt guilty" and came home this morning to check on her. I asked if they are willing to stay now, she said they are willing to take care of her at home. I will discharge her AMA. She is alert and oriented x3 at the time of this decision making.   Consults: None   Significant labs/images: DG Shoulder Right  Result Date: 04/16/2020 CLINICAL DATA:  Fall, pain, bruising EXAM: RIGHT SHOULDER - 2+ VIEW; RIGHT HUMERUS - 2+ VIEW; RIGHT FOREARM - 2 VIEW COMPARISON:  None. FINDINGS: No fracture or dislocation of the right shoulder. Mild acromioclavicular and glenohumeral arthrosis. The partially included right chest is unremarkable. No fracture or dislocation of the right humerus. No fracture or dislocation of the right radius or ulna. IMPRESSION: 1. No fracture or dislocation of the right shoulder. Mild acromioclavicular and glenohumeral arthrosis. 2. No fracture or dislocation of the right humerus. 3. No fracture or dislocation of the right radius or ulna. Electronically Signed   By: Eddie Candle M.D.   On: 04/16/2020 15:51   DG Forearm Right  Result Date: 04/16/2020 CLINICAL DATA:  Fall, pain, bruising EXAM: RIGHT SHOULDER - 2+ VIEW; RIGHT HUMERUS - 2+ VIEW; RIGHT FOREARM - 2 VIEW COMPARISON:  None. FINDINGS: No fracture or dislocation of the right shoulder. Mild acromioclavicular and glenohumeral arthrosis. The partially included right chest is unremarkable. No fracture or dislocation of the right humerus. No fracture or dislocation of the right radius or ulna. IMPRESSION:  1. No fracture or dislocation of the right shoulder. Mild acromioclavicular and glenohumeral arthrosis. 2. No fracture or dislocation of the right humerus. 3. No fracture or dislocation of the right radius or ulna. Electronically Signed   By: Eddie Candle M.D.   On: 04/16/2020 15:51   CT Head Wo Contrast  Result Date: 04/16/2020 CLINICAL DATA:  Fall, hematoma to right eyebrow EXAM: CT HEAD WITHOUT CONTRAST TECHNIQUE: Contiguous axial images were obtained from the base of the skull through the vertex without intravenous contrast. COMPARISON:  None. FINDINGS: Brain: No evidence of acute infarction, hemorrhage, hydrocephalus,  extra-axial collection or mass lesion/mass effect. Vascular: No hyperdense vessel or unexpected calcification. Skull: Normal. Negative for fracture or focal lesion. Sinuses/Orbits: No acute finding. Other: Soft tissue contusion of the right brow (series 3, image 5). IMPRESSION: 1. No acute intracranial pathology. 2. Soft tissue contusion of the right brow. Electronically Signed   By: Eddie Candle M.D.   On: 04/16/2020 15:06   DG Humerus Right  Result Date: 04/16/2020 CLINICAL DATA:  Fall, pain, bruising EXAM: RIGHT SHOULDER - 2+ VIEW; RIGHT HUMERUS - 2+ VIEW; RIGHT FOREARM - 2 VIEW COMPARISON:  None. FINDINGS: No fracture or dislocation of the right shoulder. Mild acromioclavicular and glenohumeral arthrosis. The partially included right chest is unremarkable. No fracture or dislocation of the right humerus. No fracture or dislocation of the right radius or ulna. IMPRESSION: 1. No fracture or dislocation of the right shoulder. Mild acromioclavicular and glenohumeral arthrosis. 2. No fracture or dislocation of the right humerus. 3. No fracture or dislocation of the right radius or ulna. Electronically Signed   By: Eddie Candle M.D.   On: 04/16/2020 15:51    I personally reviewed and interpreted all labs.  The plan for this patient was discussed with Dr. Ashok Cordia, who voiced agreement and  who oversaw evaluation and treatment of this patient.  I have discussed my concerns as a provider and the possibility that this may worsen. We discussed the nature, risks and benefits, and alternatives to treatment. I have specifically discussed that without further evaluation I cannot guarantee there is not a life threatening event occuring.  Time was given to allow the opportunity to ask questions and consider the options, and after the discussion, the patient decided to refuse the offered treatment. Patient is alert and oriented x4, their own POA and states understanding of my concerns and the possible consequences. After refusal, I made every reasonable opportunity to treat them to the best of my ability. I have made the patient aware that this is an [place] discharge, but he may return at any time for further evaluation and treatment.    Portions of this note were generated with [employer]. Dictation errors may occur despite best attempts at proofreading.     Delia Heady, PA-C 04/16/20 1852    Lajean Saver, MD 04/16/20 [phone]

## 2020-04-16 NOTE — Telephone Encounter (Signed)
Scheduled per 04/14 scheduled message, called patient's son and they are notified of the appointment.

## 2020-04-16 NOTE — Discharge Instructions (Addendum)
It was recommended that you be admitted here in the hospital today due to your fall and worsening labs. Make sure there is someone at your home to take care of you 24/7 as this may happen to you again. You have elected to leave [facility] but you can return at any time to seek treatment or if you have any worsening lightheadedness, dizziness, chest pain, shortness of breath, injuries or falls.

## 2020-04-16 NOTE — ED Triage Notes (Signed)
Pt arrived from home after having a fall early morning, pt states she got lightheaded   Pt was just discharged from [facility] oncology yesterday for lukemia.  PMP wanted pt seen due to low platelet counts.   Pt son at bedside to assist with answering questions, pt took her medications prior to arrival and is groggy.  Son states pt is often confused and it at her baseline

## 2020-04-16 NOTE — ED Triage Notes (Signed)
Emergency Medicine Provider Triage Evaluation Note  Kelly Roberson , a 60 y.o. female  was evaluated in triage.  Pt complains of fall, discharged from [facility] yesterday, receiving chemo with AML, hit head sometime this morning, no LOC,at  baseline mental status. Referred by oncologist given low platelet count   Review of Systems  Positive: Fall Negative: Headache, nausea, vomititng, blurry vision, neck pain, numbness,  chest pain   Physical Exam  BP 124/64 (BP Location: Left Arm)   Pulse 86   Temp 98.7 F (37.1 C) (Oral)   Resp 18   SpO2 96%  Gen:   Awake, no distress   HEENT:  Atraumatic  Resp:  Normal effort  Cardiac:  Normal rate  Abd:   Nondistended, nontender  MSK:   Moves extremities without difficulty  Neuro:  Speech clear   Medical Decision Making  Medically screening exam initiated at 1:17 PM.  Appropriate orders placed.  [name] was informed that the remainder of the evaluation will be completed by another provider, this initial triage assessment does not replace that evaluation, and the importance of remaining in the ED until their evaluation is complete.  Clinical Impression  60 year old female who presents after a fall.  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  Reportedly had a head injury, but no LOC.  Has no complaints currently.  She was sent by her oncologist to have a CT scan of the head given she has a low platelet count.  Basic labs as well as CT head has been ordered.  Currently stable.   [name], PA-C 04/16/20 1317

## 2020-04-16 NOTE — Telephone Encounter (Signed)
Received call from patient initially, stating she fell this morning and hit her head. She has a lump on her head the size of a ping pong ball. Then spoke with her adult son, Darnelle Maffucci. He states he found her on the floor this morning. He is unsure how long she has been on the floor. She was discharged from [facility] yesterday after a lengthy stay for her AML. Her platelet count yesterday was 21k.  Darnelle Maffucci states he believes she has some mental status changes.  He also states that she is unable to stand or walk on her own, she cannot dress herself and her medications are a 'mess'   Pt is to have home health per discharge instructions   Advised that Darnelle Maffucci needs to take her to [facility] ED right away due to the fall.  Darnelle Maffucci voices understanding. I could hear pt in the background agreeing to go to ED.  Call made to charge nurse in the [facility] ED and made him aware of pt's pending arrival and why she is coming.  [name] is aware of the above.

## 2020-04-16 NOTE — ED Notes (Signed)
Pt recommended to be admitted however pt adamant to leave AMA. Pt caox4 and verbalized understanding of risks to leaving ED AMA. Pt taken to waiting room via wheelchair, pt's son contacted to pick pt up and expressed concern with pt being discharged. It was explained to pt's son that pt is alert and orientated which allows her to make decisions for herself and we cannot force her to stay.

## 2020-04-17 ENCOUNTER — Telehealth: Payer: Self-pay | Admitting: Hematology and Oncology

## 2020-04-17 ENCOUNTER — Encounter: Payer: Self-pay | Admitting: General Practice

## 2020-04-17 ENCOUNTER — Inpatient Hospital Stay (HOSPITAL_BASED_OUTPATIENT_CLINIC_OR_DEPARTMENT_OTHER): Payer: Medicaid Other | Admitting: Hematology and Oncology

## 2020-04-17 ENCOUNTER — Inpatient Hospital Stay: Payer: Medicaid Other | Attending: Hematology and Oncology

## 2020-04-17 VITALS — BP 139/63 | HR 82 | Temp 99.1°F | Resp 18 | Ht 68.0 in | Wt 187.9 lb

## 2020-04-17 DIAGNOSIS — T451X5A Adverse effect of antineoplastic and immunosuppressive drugs, initial encounter: Secondary | ICD-10-CM

## 2020-04-17 DIAGNOSIS — D701 Agranulocytosis secondary to cancer chemotherapy: Secondary | ICD-10-CM | POA: Diagnosis not present

## 2020-04-17 DIAGNOSIS — C9201 Acute myeloblastic leukemia, in remission: Secondary | ICD-10-CM

## 2020-04-17 DIAGNOSIS — R531 Weakness: Secondary | ICD-10-CM | POA: Diagnosis not present

## 2020-04-17 DIAGNOSIS — F419 Anxiety disorder, unspecified: Secondary | ICD-10-CM | POA: Diagnosis not present

## 2020-04-17 DIAGNOSIS — Z9221 Personal history of antineoplastic chemotherapy: Secondary | ICD-10-CM | POA: Insufficient documentation

## 2020-04-17 DIAGNOSIS — I10 Essential (primary) hypertension: Secondary | ICD-10-CM | POA: Insufficient documentation

## 2020-04-17 DIAGNOSIS — Z9882 Breast implant status: Secondary | ICD-10-CM | POA: Diagnosis not present

## 2020-04-17 DIAGNOSIS — Z87891 Personal history of nicotine dependence: Secondary | ICD-10-CM | POA: Insufficient documentation

## 2020-04-17 DIAGNOSIS — Z79899 Other long term (current) drug therapy: Secondary | ICD-10-CM | POA: Diagnosis not present

## 2020-04-17 DIAGNOSIS — K589 Irritable bowel syndrome without diarrhea: Secondary | ICD-10-CM | POA: Diagnosis not present

## 2020-04-17 DIAGNOSIS — C9200 Acute myeloblastic leukemia, not having achieved remission: Secondary | ICD-10-CM | POA: Insufficient documentation

## 2020-04-17 DIAGNOSIS — D696 Thrombocytopenia, unspecified: Secondary | ICD-10-CM | POA: Diagnosis not present

## 2020-04-17 DIAGNOSIS — G2581 Restless legs syndrome: Secondary | ICD-10-CM | POA: Diagnosis not present

## 2020-04-17 DIAGNOSIS — Z923 Personal history of irradiation: Secondary | ICD-10-CM | POA: Insufficient documentation

## 2020-04-17 DIAGNOSIS — Z9181 History of falling: Secondary | ICD-10-CM | POA: Insufficient documentation

## 2020-04-17 LAB — CBC WITH DIFFERENTIAL (CANCER CENTER ONLY)
Abs Immature Granulocytes: 0.05 10*3/uL (ref 0.00–0.07)
Basophils Absolute: 0 10*3/uL (ref 0.0–0.1)
Basophils Relative: 0 %
Eosinophils Absolute: 0 10*3/uL (ref 0.0–0.5)
Eosinophils Relative: 0 %
HCT: 25.7 % — ABNORMAL LOW (ref 36.0–46.0)
Hemoglobin: 8.5 g/dL — ABNORMAL LOW (ref 12.0–15.0)
Immature Granulocytes: 9 %
Lymphocytes Relative: 47 %
Lymphs Abs: 0.3 10*3/uL — ABNORMAL LOW (ref 0.7–4.0)
MCH: 30.8 pg (ref 26.0–34.0)
MCHC: 33.1 g/dL (ref 30.0–36.0)
MCV: 93.1 fL (ref 80.0–100.0)
Monocytes Absolute: 0.1 10*3/uL (ref 0.1–1.0)
Monocytes Relative: 12 %
Neutro Abs: 0.2 10*3/uL — CL (ref 1.7–7.7)
Neutrophils Relative %: 32 %
Platelet Count: 17 10*3/uL — ABNORMAL LOW (ref 150–400)
RBC: 2.76 MIL/uL — ABNORMAL LOW (ref 3.87–5.11)
RDW: 15.5 % (ref 11.5–15.5)
WBC Count: 0.6 10*3/uL — CL (ref 4.0–10.5)
WBC Morphology: 6
nRBC: 0 % (ref 0.0–0.2)

## 2020-04-17 LAB — CMP (CANCER CENTER ONLY)
ALT: 12 U/L (ref 0–44)
AST: 21 U/L (ref 15–41)
Albumin: 2.9 g/dL — ABNORMAL LOW (ref 3.5–5.0)
Alkaline Phosphatase: 239 U/L — ABNORMAL HIGH (ref 38–126)
Anion gap: 11 (ref 5–15)
BUN: 4 mg/dL — ABNORMAL LOW (ref 8–23)
CO2: 27 mmol/L (ref 22–32)
Calcium: 8.2 mg/dL — ABNORMAL LOW (ref 8.9–10.3)
Chloride: 102 mmol/L (ref 98–111)
Creatinine: 0.66 mg/dL (ref 0.44–1.00)
GFR, Estimated: >60 mL/min (ref >60)
Glucose, Bld: 103 mg/dL — ABNORMAL HIGH (ref 70–99)
Potassium: 3.4 mmol/L — ABNORMAL LOW (ref 3.5–5.1)
Sodium: 140 mmol/L (ref 135–145)
Total Bilirubin: 1 mg/dL (ref 0.3–1.2)
Total Protein: 5.7 g/dL — ABNORMAL LOW (ref 6.5–8.1)

## 2020-04-17 LAB — SAMPLE TO BLOOD BANK

## 2020-04-17 NOTE — Telephone Encounter (Signed)
Scheduled appt per 4/15 sch msg. Called pt, no answer. Left msg with appt date and time.  

## 2020-04-17 NOTE — Progress Notes (Signed)
[place] CSW Progress Notes  Call from Edwyna Shell RN to meet w patient and two sons Erlene Quan and Veyo) in exam room. Patient is seated in a wheelchair, frail appearing, displays a large bruise from recent fall in the home.  She appears weak, debilitated.   Patient recently discharged from [facility] after approx 2 month hospital stay.  She was in ED yesterday after fall at home.  Sons are very concerned that she is unable to care for herself at home and they are unable to prevent her from further injury at home.  Patient describes her hospital stay as "nerve wracking" and very difficult.  Her fervently expressed desire is to go home, take care of personal business like paying bills, taking a shower and similar.  Her sons have been staying with her and feel they are not able to prevent her from falling - as evidenced by unwitnessed fall prior to recent ED visit.    Per sons, initial plan was for patient to discharge from [place] to an assisted living facility.  However, she did not consent to this plan and family honored her wishes to return home and have been staying with her over the past two days 24/7.  Sons find this quite distressing - feeling patient is too weak to be home and is at risk for fall/other serious injury.  Patient has no home health services in place at this time - discharge plan from [place] was a weekly visit from a [facility].    Nani Skillern [phone]) was discharge planner from [place], per Georganna Skeans had meeting w family and patient.  They recommended facility placement due to their "concern for her delirium", however family and patient wanted her to return home.  Sons agreed to provide a week of 24/7 care for patient in the home.  [facility] arranged home health with [facility], [place] with [facility].  Per family, neither of these services are in place at this time.    Patient has a [facility] CM - Barb Merino [phone].  She is responsible for ensuring patient has the services needed through  [facility].  CM has spoken w Darnelle Maffucci, son, today and Nani Skillern, [facility] CM, yesterday.  Per RN CM at [facility], she has been approved for 18 hours/week of services.  She also has a PCP CM through [facility] who will have to do another assessment to determine if additional hours can be supplied.    Patient is fully able to voice her wishes at this point - she is clear that returning home for the weekend is her adamant desire despite medical opinion from her oncologist that she would be better cared for inpatient.  She is aware that she is at risk of falling or experiencing other serious consequences.  She remains adamant in her desire to go home and refuses to be admitted to the inpatient setting today.  She wants to be admitted on Monday after having time to be at home.  Sons are unable to provide 24/7 care, patient responds that she has friends/neighbors that she can call upon as needed.  She believes she has a transport service that will bring her to the ED if she needs.    Consulted w oncologist, he will discuss next steps with patient and family.    Patient has [facility] managed Medicaid.  She has had  in the past, her hours were increased to 18/week upon discharge from [facility].   Her PCP RN CM should be contacting  patient to arrange for another assessment which may result in increased hours.  Communicated above to treatment team.  Edwyna Shell, LCSW Clinical Social Worker Phone:  [phone]

## 2020-04-17 NOTE — Progress Notes (Signed)
Pt came to clinic with her 2 sons, Darnelle Maffucci and [place]. Pt appears quite ill, temporal wasting, pupils pinpoint from her pain medication.She is in a wheelchair.  Noted brusie to right eyebrow area from fall yesterday. Pt had been discharged from [facility] on Wednesday with one son with her. She had a fall and was in the ED yesterday and refused to be admitted.  Sons are very adamant that their mother is unsafe at home. They have limited time to be able to stay with her 24/7 as they have jobs and families to take care of.  Pt is to have [facility] care per our Social worker-see her note. But they have not started yet.  Much time spent with patient and sons, as well as SW to discuss care options for this patient. Pt refuses to be admitted again today as she just wants to be at home. We reveiwed the great risks involved with her having much time in her home without anyone there after the sons leave. One son will leave tomorrow and the other son can stay til Sunday.  Edwyna Shell, SW also spent a great deal of time with her and her sons as well.  Sons understand her need for 24 hour care but pt is adamantly against being admitted today. She states she will come back on Monday for this admission and eventual placement in an appropriate care facility.  Neither son can be here on Monday at this point. Durenda states a friend, Maudie Mercury, will bring her here on Monday and stay with her at night Sunday night. She does admit that she has not talked to [place] about this yet and that Maudie Mercury has not seen her in quite awhile. Much emotional support extended to patient and her sons. High priority message sent for pt's appts on Monday, 04/20/20 [name] involved in much of the above discussions.

## 2020-04-18 ENCOUNTER — Encounter: Payer: Self-pay | Admitting: Hematology and Oncology

## 2020-04-18 NOTE — Progress Notes (Signed)
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 Found to have pancytopenia and peripheral blasts with intracellular inclusions.  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 4) 05/16/2019: repeat Bmbx shows no residual leukemia 5) 06/17/2019: patient returned for Cycle 1 Consolidation with cytarabine and gemtuzumab 6) 06/24/2019: presents to reconnect with [facility] for co-managed care. 7) 07/15/2019: Cycle 2 of consolidation therapy  8) 07/30/2019: admitted for BRBPR/ Infectious colitis.  9) 08/26/2019: start of Cycle 3 of consolidation therapy at [facility]  10) 09/09/2019: admitted with neutropenic fever and BRBPR 11) 10/23/2019: evaluated by Dr. Elmo Putt. After discussion with him decided against oral azacitidine therapy 12) 02/17/2020-04/15/2020: admitted to [facility]. Treated with Decitabine/Venetoclax x 2 cycles  Interval History:  Kelly Roberson 60 y.o. female with medical history significant for AML s/p induction therapy and 3 cycles of consolidation with subsequent relapse and treatment with Decitabine/Venetoclax who presents for a follow up visit. The patient's last visit was on 12/02/2019. In the interim she was admitted to [facility] for 2 months time for treatment of her relapsed AML.  On exam today Kelly Roberson is accompanied by both her sons.  She reports that yesterday she got up to go to the bathroom and unfortunately fell.  She notes that she did not trip over anything and it was not a mechanical fall but that she felt herself going down into the best she could to brace herself.  She did  unfortunately hit her head and has a small contusion above her eyebrow.  Fortunately she is not had any headache, overt bleeding since that fall.  She does have considerable bruising over her right shoulder.  Otherwise she reports that she is not having issues with fevers, chills, sweats, nausea, vomiting or diarrhea.  A full 10 point ROS is listed below.  The bulk of our discussion was focused on having the patient admitted due to instability on her feet and her severe cytopenias.  The full details of this conversation are listed below, however at the end of the conversation the patient was found to have decision-making capacity and we honored her wishes of allowing her to return home despite the fact we did not believe this was a safe plan.  MEDICAL HISTORY:  Past Medical History:  Diagnosis Date  . Anxiety   . Cancer ([place])   . Hepatitis B   . Hepatitis C   . Hypertension   . IBS (irritable bowel syndrome)   . RLS (restless legs syndrome) 01/09/2020    SURGICAL HISTORY: Past Surgical History:  Procedure Laterality Date  . GASTRIC BYPASS  2013  . NISSEN FUNDOPLICATION  0263   Reversed in 2013  . ORIF ANKLE FRACTURE Left 06/2017  . PLACEMENT OF BREAST IMPLANTS      SOCIAL HISTORY: Social History   Socioeconomic History  . Marital status: Divorced    Spouse name: Not on file  . Number of children: Not on file  . Years of education: Not on file  . Highest education level: Not on file  Occupational History  . Occupation: disability  Tobacco Use  . Smoking status: Former Research scientist (life sciences)  . Smokeless tobacco: Never Used  Substance  and Sexual Activity  . Alcohol use: Not Currently    Comment: occasionally   . Drug use: Not Currently  . Sexual activity: Not Currently  Other Topics Concern  . Not on file  Social History Narrative   Lives alone   Right Handed   Drinks 2-3 cups caffeine daily   Social Determinants of Health   Financial Resource Strain: Not on file  Food Insecurity:  Not on file  Transportation Needs: Not on file  Physical Activity: Not on file  Stress: Not on file  Social Connections: Not on file  Intimate Partner Violence: Not on file    FAMILY HISTORY: Family History  Problem Relation Age of Onset  . Pancreatic cancer Father   . Colon cancer Maternal Aunt   . Diabetes Maternal Uncle   . Pancreatic cancer Maternal Grandfather     ALLERGIES:  is allergic to ace inhibitors, lisinopril, and tylenol [acetaminophen].  MEDICATIONS:  Current Outpatient Medications  Medication Sig Dispense Refill  . acyclovir (ZOVIRAX) 400 MG tablet Take 400 mg by mouth 2 (two) times daily.    . cholecalciferol (VITAMIN D) 25 MCG (1000 UNIT) tablet Take 2,000 Units by mouth daily.    . DULoxetine (CYMBALTA) 20 MG capsule Take 20 mg by mouth daily.    Marland Kitchen escitalopram (LEXAPRO) 10 MG tablet Take 10 mg by mouth daily. Take 1/2 tablet daily    . HYDROmorphone (DILAUDID) 2 MG tablet Take 2 mg by mouth every 4 (four) hours as needed for severe pain.    Marland Kitchen levofloxacin (LEVAQUIN) 500 MG tablet Take 500 mg by mouth daily.    . methocarbamol (ROBAXIN) 500 MG tablet Take 1,000 mg by mouth 4 (four) times daily. As needed    . oxyCODONE (OXYCONTIN) 20 mg 12 hr tablet Take 20 mg by mouth every 12 (twelve) hours.    . pantoprazole (PROTONIX) 40 MG tablet Take 40 mg by mouth daily.    Marland Kitchen POSACONAZOLE PO Take 100 mg by mouth daily. Take 3 tablets daily (300 mg)    . pregabalin (LYRICA) 150 MG capsule Take 150 mg by mouth 3 (three) times daily.    Marland Kitchen ALPRAZolam (XANAX) 0.5 MG tablet Take 0.5 mg by mouth at bedtime as needed.    . topiramate (TOPAMAX) 25 MG tablet Take 1 tablet (25 mg total) by mouth 2 (two) times daily. 120 tablet 3  . traZODone (DESYREL) 100 MG tablet Take 100 mg by mouth at bedtime.     No current facility-administered medications for this visit.    REVIEW OF SYSTEMS:   Constitutional: ( - ) fevers, ( - )  chills , ( - ) night sweats Eyes: ( - ) blurriness of  vision, ( - ) double vision, ( - ) watery eyes Ears, nose, mouth, throat, and face: ( - ) mucositis, ( - ) sore throat Respiratory: ( - ) cough, ( - ) dyspnea, ( - ) wheezes Cardiovascular: ( - ) palpitation, ( - ) chest discomfort, ( - ) lower extremity swelling Gastrointestinal:  ( - ) nausea, ( - ) heartburn, ( - ) change in bowel habits Skin: ( - ) abnormal skin rashes Lymphatics: ( - ) new lymphadenopathy, ( - ) easy bruising Neurological: ( - ) numbness, ( - ) tingling, ( - ) new weaknesses Behavioral/Psych: ( - ) mood change, ( - ) new changes  All other systems were reviewed with the patient and are negative.  PHYSICAL EXAMINATION: ECOG PERFORMANCE STATUS: 1 -  Symptomatic but completely ambulatory  Vitals:   04/17/20 1245  BP: 139/63  Pulse: 82  Resp: 18  Temp: 99.1 F (37.3 C)  SpO2: 97%   Filed Weights   04/17/20 1245  Weight: 187 lb 14.4 oz (85.2 kg)    GENERAL: chronically ill appearing middle aged Caucasian female, alert, no distress and comfortable SKIN: skin color, texture, turgor are normal, no rashes or significant lesions EYES: conjunctiva are pink and non-injected, sclera clear LUNGS: clear to auscultation and percussion with normal breathing effort HEART: regular rate & rhythm and no murmurs and no lower extremity edema Musculoskeletal: no cyanosis of digits and no clubbing  PSYCH: alert & oriented x 3, fluent speech NEURO: no focal motor/sensory deficits  LABORATORY DATA:  I have reviewed the data as listed CBC Latest Ref Rng & Units 04/17/2020 04/16/2020 01/07/2020  WBC 4.0 - 10.5 K/uL 0.6(LL) 0.7(LL) 4.4  Hemoglobin 12.0 - 15.0 g/dL 8.5(L) 8.4(L) 14.6  Hematocrit 36.0 - 46.0 % 25.7(L) 25.6(L) 42.0  Platelets 150 - 400 K/uL 17(L) 19(LL) 147(L)    CMP Latest Ref Rng & Units 04/17/2020 04/16/2020 01/10/2020  Glucose 70 - 99 mg/dL 103(H) 101(H) -  BUN 8 - 23 mg/dL 4(L) 6(L) -  Creatinine 0.44 - 1.00 mg/dL 0.66 0.68 -  Sodium 135 - 145 mmol/L 140 139 -   Potassium 3.5 - 5.1 mmol/L 3.4(L) 3.8 -  Chloride 98 - 111 mmol/L 102 104 -  CO2 22 - 32 mmol/L 27 28 -  Calcium 8.9 - 10.3 mg/dL 8.2(L) 8.5(L) -  Total Protein 6.5 - 8.1 g/dL 5.7(L) 5.4(L) 6.7  Total Bilirubin 0.3 - 1.2 mg/dL 1.0 1.1 -  Alkaline Phos 38 - 126 U/L 239(H) 205(H) -  AST 15 - 41 U/L 21 23 -  ALT 0 - 44 U/L 12 16 -    RADIOGRAPHIC STUDIES: DG Shoulder Right  Result Date: 04/16/2020 CLINICAL DATA:  Fall, pain, bruising EXAM: RIGHT SHOULDER - 2+ VIEW; RIGHT HUMERUS - 2+ VIEW; RIGHT FOREARM - 2 VIEW COMPARISON:  None. FINDINGS: No fracture or dislocation of the right shoulder. Mild acromioclavicular and glenohumeral arthrosis. The partially included right chest is unremarkable. No fracture or dislocation of the right humerus. No fracture or dislocation of the right radius or ulna. IMPRESSION: 1. No fracture or dislocation of the right shoulder. Mild acromioclavicular and glenohumeral arthrosis. 2. No fracture or dislocation of the right humerus. 3. No fracture or dislocation of the right radius or ulna. Electronically Signed   By: Eddie Candle M.D.   On: 04/16/2020 15:51   DG Forearm Right  Result Date: 04/16/2020 CLINICAL DATA:  Fall, pain, bruising EXAM: RIGHT SHOULDER - 2+ VIEW; RIGHT HUMERUS - 2+ VIEW; RIGHT FOREARM - 2 VIEW COMPARISON:  None. FINDINGS: No fracture or dislocation of the right shoulder. Mild acromioclavicular and glenohumeral arthrosis. The partially included right chest is unremarkable. No fracture or dislocation of the right humerus. No fracture or dislocation of the right radius or ulna. IMPRESSION: 1. No fracture or dislocation of the right shoulder. Mild acromioclavicular and glenohumeral arthrosis. 2. No fracture or dislocation of the right humerus. 3. No fracture or dislocation of the right radius or ulna. Electronically Signed   By: Eddie Candle M.D.   On: 04/16/2020 15:51   CT Head Wo Contrast  Result Date: 04/16/2020 CLINICAL DATA:  Fall, hematoma to  right eyebrow EXAM: CT HEAD WITHOUT CONTRAST TECHNIQUE: Contiguous axial images were obtained from the base of the skull through the vertex  without intravenous contrast. COMPARISON:  None. FINDINGS: Brain: No evidence of acute infarction, hemorrhage, hydrocephalus, extra-axial collection or mass lesion/mass effect. Vascular: No hyperdense vessel or unexpected calcification. Skull: Normal. Negative for fracture or focal lesion. Sinuses/Orbits: No acute finding. Other: Soft tissue contusion of the right brow (series 3, image 5). IMPRESSION: 1. No acute intracranial pathology. 2. Soft tissue contusion of the right brow. Electronically Signed   By: Eddie Candle M.D.   On: 04/16/2020 15:06   DG Humerus Right  Result Date: 04/16/2020 CLINICAL DATA:  Fall, pain, bruising EXAM: RIGHT SHOULDER - 2+ VIEW; RIGHT HUMERUS - 2+ VIEW; RIGHT FOREARM - 2 VIEW COMPARISON:  None. FINDINGS: No fracture or dislocation of the right shoulder. Mild acromioclavicular and glenohumeral arthrosis. The partially included right chest is unremarkable. No fracture or dislocation of the right humerus. No fracture or dislocation of the right radius or ulna. IMPRESSION: 1. No fracture or dislocation of the right shoulder. Mild acromioclavicular and glenohumeral arthrosis. 2. No fracture or dislocation of the right humerus. 3. No fracture or dislocation of the right radius or ulna. Electronically Signed   By: Eddie Candle M.D.   On: 04/16/2020 15:51    ASSESSMENT & PLAN Renn Stille 60 y.o. female with medical history significant for AML s/p induction therapy/HIDAC consolidation with subseuquent recurrence s/p decitabine/venetoclax who presents for a follow up visit.  We are happy to have Kelly Roberson return to clinic after her long stay at [facility].  Previously we discussed the nature of comanaged care in the treatment of acute myeloid leukemia.  Comanaged care involves the treatment recommendations and administration of  chemotherapy by a specialized academic cancer center with local support here at our community hospital.  We would be happy to provide services including emergency care, medication refills, blood tests, transfusion, and supportive care such as G-CSF in the management of her AML.  Major clinical decisions regarding the treatment of the leukemia itself are deferred to [facility].  We are also able to provide imaging services as well as bone marrow biopsy if required.  On exam today Kelly Roberson is not safe to go home.  She is unstable on her feet and had a fall on 04/16/2020 at which time she said to the emergency department.  Fortunately she does not have any evidence of brain bleed despite having a marked thrombocytopenia.  The bulk of our discussion today focused on trying to get her admitted back to the hospital as being at home with her blood counts as they are and her unstable gait is not a safe combination.  Despite her best efforts the patient had decision-making capacity and wanted to go home and therefore we honored her wishes.  # Weakness/Deconditioning #Fall From Standing --Patient was taken to the emergency room on 04/16/2020 for a fall from standing with trauma to the head. --This occurred 1 day after discharge from [facility].  Her admission there lasted approximately 2 months. --Patient declined to be admitted to the hospital at that time. --Today we strongly encouraged the patient for consideration of admission and evaluation for consideration of rehab facility. --Unfortunately despite the protestations of her sons the patient declined to be admitted despite her unstable gait and severe weakness --My nurse and myself strongly encourage the patient to be admitted as I do not believe it is safe for her to go home with such marked thrombocytopenia, anemia, neutropenia and weakness and unstable gait.  Please see my nurses note for  further detail regarding this  discussion --The patient has decision-making capacity and therefore we must honor her wishes of wanting to go home despite the fact I do not believe this is a safe plan.  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 Elmo Putt providing recommendations. We are happy to provide blood checks and treatment for urgent issues locally. --patient completed induction and 3 cycles of consolidation therapy. Unfortunately she had relapsed disease and was treated with decitabine/Venetoclax in inpatient setting, with undetectable blasts at end of treatment.  -- Her Hgb and Plt are quite low, but not at the point of transfusion today. .  --continue f/u at [facility] with Dr. Elmo Putt. We will continue to monitor her closely locally. --RTC for twice weekly blood draws with q 2 week clinic visits.   No orders of the defined types were placed in this encounter.  All questions were answered. The patient knows to call the clinic with any problems, questions or concerns.  A total of more than 30 minutes were spent on this encounter and over half of that time was spent on counseling and coordination of care as outlined above.   Ledell Peoples, MD Department of Hematology/Oncology [place] at [facility] Phone: [phone] Pager: [phone] Email: [email or web address]  04/18/2020 10:13 AM

## 2020-04-20 ENCOUNTER — Encounter (HOSPITAL_COMMUNITY): Payer: Self-pay | Admitting: Internal Medicine

## 2020-04-20 ENCOUNTER — Inpatient Hospital Stay: Payer: Medicaid Other

## 2020-04-20 ENCOUNTER — Other Ambulatory Visit: Payer: Medicaid Other

## 2020-04-20 ENCOUNTER — Telehealth: Payer: Self-pay | Admitting: Hematology and Oncology

## 2020-04-20 ENCOUNTER — Ambulatory Visit: Payer: Medicaid Other | Admitting: Hematology and Oncology

## 2020-04-20 ENCOUNTER — Inpatient Hospital Stay (HOSPITAL_BASED_OUTPATIENT_CLINIC_OR_DEPARTMENT_OTHER): Payer: Medicaid Other | Admitting: Hematology and Oncology

## 2020-04-20 ENCOUNTER — Other Ambulatory Visit: Payer: Self-pay

## 2020-04-20 ENCOUNTER — Inpatient Hospital Stay (HOSPITAL_COMMUNITY)
Admission: AD | Admit: 2020-04-20 | Discharge: 2020-05-02 | DRG: 835 | Disposition: A | Discharge status: Hospice | Payer: Medicaid Other | Attending: Family Medicine | Admitting: Family Medicine

## 2020-04-20 VITALS — BP 135/58 | HR 77 | Temp 97.8°F | Resp 17 | Ht 68.0 in | Wt 185.2 lb

## 2020-04-20 DIAGNOSIS — D61818 Other pancytopenia: Secondary | ICD-10-CM | POA: Diagnosis present

## 2020-04-20 DIAGNOSIS — Z79899 Other long term (current) drug therapy: Secondary | ICD-10-CM | POA: Diagnosis not present

## 2020-04-20 DIAGNOSIS — D696 Thrombocytopenia, unspecified: Secondary | ICD-10-CM

## 2020-04-20 DIAGNOSIS — Z888 Allergy status to other drugs, medicaments and biological substances status: Secondary | ICD-10-CM | POA: Diagnosis not present

## 2020-04-20 DIAGNOSIS — D709 Neutropenia, unspecified: Secondary | ICD-10-CM | POA: Diagnosis present

## 2020-04-20 DIAGNOSIS — K589 Irritable bowel syndrome without diarrhea: Secondary | ICD-10-CM | POA: Diagnosis present

## 2020-04-20 DIAGNOSIS — Z87891 Personal history of nicotine dependence: Secondary | ICD-10-CM

## 2020-04-20 DIAGNOSIS — B1920 Unspecified viral hepatitis C without hepatic coma: Secondary | ICD-10-CM | POA: Diagnosis present

## 2020-04-20 DIAGNOSIS — Z9181 History of falling: Secondary | ICD-10-CM

## 2020-04-20 DIAGNOSIS — D759 Disease of blood and blood-forming organs, unspecified: Secondary | ICD-10-CM | POA: Diagnosis present

## 2020-04-20 DIAGNOSIS — Z800 Family history of malignant neoplasm of digestive organs: Secondary | ICD-10-CM | POA: Diagnosis not present

## 2020-04-20 DIAGNOSIS — F32A Depression, unspecified: Secondary | ICD-10-CM | POA: Diagnosis not present

## 2020-04-20 DIAGNOSIS — R531 Weakness: Secondary | ICD-10-CM | POA: Diagnosis not present

## 2020-04-20 DIAGNOSIS — T451X5A Adverse effect of antineoplastic and immunosuppressive drugs, initial encounter: Secondary | ICD-10-CM | POA: Diagnosis not present

## 2020-04-20 DIAGNOSIS — B1910 Unspecified viral hepatitis B without hepatic coma: Secondary | ICD-10-CM | POA: Diagnosis present

## 2020-04-20 DIAGNOSIS — Z515 Encounter for palliative care: Secondary | ICD-10-CM

## 2020-04-20 DIAGNOSIS — D701 Agranulocytosis secondary to cancer chemotherapy: Secondary | ICD-10-CM | POA: Diagnosis not present

## 2020-04-20 DIAGNOSIS — Z886 Allergy status to analgesic agent status: Secondary | ICD-10-CM

## 2020-04-20 DIAGNOSIS — I10 Essential (primary) hypertension: Secondary | ICD-10-CM | POA: Diagnosis present

## 2020-04-20 DIAGNOSIS — Z20822 Contact with and (suspected) exposure to covid-19: Secondary | ICD-10-CM | POA: Diagnosis present

## 2020-04-20 DIAGNOSIS — R296 Repeated falls: Secondary | ICD-10-CM | POA: Diagnosis present

## 2020-04-20 DIAGNOSIS — C9201 Acute myeloblastic leukemia, in remission: Secondary | ICD-10-CM

## 2020-04-20 DIAGNOSIS — E876 Hypokalemia: Secondary | ICD-10-CM | POA: Diagnosis present

## 2020-04-20 DIAGNOSIS — C9200 Acute myeloblastic leukemia, not having achieved remission: Principal | ICD-10-CM | POA: Diagnosis present

## 2020-04-20 DIAGNOSIS — E440 Moderate protein-calorie malnutrition: Secondary | ICD-10-CM | POA: Insufficient documentation

## 2020-04-20 DIAGNOSIS — G2581 Restless legs syndrome: Secondary | ICD-10-CM | POA: Diagnosis present

## 2020-04-20 DIAGNOSIS — N939 Abnormal uterine and vaginal bleeding, unspecified: Secondary | ICD-10-CM

## 2020-04-20 DIAGNOSIS — R5081 Fever presenting with conditions classified elsewhere: Secondary | ICD-10-CM | POA: Diagnosis present

## 2020-04-20 DIAGNOSIS — C9202 Acute myeloblastic leukemia, in relapse: Secondary | ICD-10-CM | POA: Diagnosis not present

## 2020-04-20 DIAGNOSIS — Z6828 Body mass index (BMI) 28.0-28.9, adult: Secondary | ICD-10-CM

## 2020-04-20 DIAGNOSIS — F419 Anxiety disorder, unspecified: Secondary | ICD-10-CM | POA: Diagnosis present

## 2020-04-20 DIAGNOSIS — Z66 Do not resuscitate: Secondary | ICD-10-CM | POA: Diagnosis not present

## 2020-04-20 DIAGNOSIS — Z7189 Other specified counseling: Secondary | ICD-10-CM

## 2020-04-20 DIAGNOSIS — M25551 Pain in right hip: Secondary | ICD-10-CM | POA: Diagnosis not present

## 2020-04-20 DIAGNOSIS — L309 Dermatitis, unspecified: Secondary | ICD-10-CM | POA: Diagnosis present

## 2020-04-20 DIAGNOSIS — R7401 Elevation of levels of liver transaminase levels: Secondary | ICD-10-CM | POA: Diagnosis not present

## 2020-04-20 DIAGNOSIS — B354 Tinea corporis: Secondary | ICD-10-CM | POA: Diagnosis present

## 2020-04-20 DIAGNOSIS — R52 Pain, unspecified: Secondary | ICD-10-CM | POA: Diagnosis not present

## 2020-04-20 DIAGNOSIS — D649 Anemia, unspecified: Secondary | ICD-10-CM | POA: Diagnosis present

## 2020-04-20 DIAGNOSIS — W1830XA Fall on same level, unspecified, initial encounter: Secondary | ICD-10-CM | POA: Diagnosis present

## 2020-04-20 DIAGNOSIS — R5381 Other malaise: Secondary | ICD-10-CM | POA: Diagnosis present

## 2020-04-20 DIAGNOSIS — R21 Rash and other nonspecific skin eruption: Secondary | ICD-10-CM | POA: Diagnosis not present

## 2020-04-20 DIAGNOSIS — R0602 Shortness of breath: Secondary | ICD-10-CM | POA: Diagnosis not present

## 2020-04-20 DIAGNOSIS — Z9884 Bariatric surgery status: Secondary | ICD-10-CM

## 2020-04-20 LAB — CBC WITH DIFFERENTIAL (CANCER CENTER ONLY)
Abs Immature Granulocytes: 0.1 10*3/uL — ABNORMAL HIGH (ref 0.00–0.07)
Band Neutrophils: 2 %
Basophils Absolute: 0 10*3/uL (ref 0.0–0.1)
Basophils Relative: 0 %
Blasts: 2 %
Eosinophils Absolute: 0 10*3/uL (ref 0.0–0.5)
Eosinophils Relative: 0 %
HCT: 23.3 % — ABNORMAL LOW (ref 36.0–46.0)
Hemoglobin: 8.1 g/dL — ABNORMAL LOW (ref 12.0–15.0)
Lymphocytes Relative: 66 %
Lymphs Abs: 0.7 10*3/uL (ref 0.7–4.0)
MCH: 31.2 pg (ref 26.0–34.0)
MCHC: 34.8 g/dL (ref 30.0–36.0)
MCV: 89.6 fL (ref 80.0–100.0)
Monocytes Absolute: 0.1 10*3/uL (ref 0.1–1.0)
Monocytes Relative: 10 %
Myelocytes: 8 %
Neutro Abs: 0.1 10*3/uL — CL (ref 1.7–7.7)
Neutrophils Relative %: 12 %
Platelet Count: 9 10*3/uL — CL (ref 150–400)
RBC: 2.6 MIL/uL — ABNORMAL LOW (ref 3.87–5.11)
RDW: 15.2 % (ref 11.5–15.5)
WBC Count: 1 10*3/uL — ABNORMAL LOW (ref 4.0–10.5)
nRBC: 3.1 % — ABNORMAL HIGH (ref 0.0–0.2)

## 2020-04-20 LAB — CMP (CANCER CENTER ONLY)
ALT: 12 U/L (ref 0–44)
AST: 25 U/L (ref 15–41)
Albumin: 3 g/dL — ABNORMAL LOW (ref 3.5–5.0)
Alkaline Phosphatase: 186 U/L — ABNORMAL HIGH (ref 38–126)
Anion gap: 15 (ref 5–15)
BUN: <4 mg/dL — ABNORMAL LOW (ref 8–23)
CO2: 26 mmol/L (ref 22–32)
Calcium: 8.3 mg/dL — ABNORMAL LOW (ref 8.9–10.3)
Chloride: 101 mmol/L (ref 98–111)
Creatinine: 0.63 mg/dL (ref 0.44–1.00)
GFR, Estimated: >60 mL/min (ref >60)
Glucose, Bld: 104 mg/dL — ABNORMAL HIGH (ref 70–99)
Potassium: 2.9 mmol/L — ABNORMAL LOW (ref 3.5–5.1)
Sodium: 142 mmol/L (ref 135–145)
Total Bilirubin: 1 mg/dL (ref 0.3–1.2)
Total Protein: 5.6 g/dL — ABNORMAL LOW (ref 6.5–8.1)

## 2020-04-20 LAB — TYPE AND SCREEN
ABO/RH(D): A POS
Antibody Screen: NEGATIVE

## 2020-04-20 LAB — SAMPLE TO BLOOD BANK

## 2020-04-20 MED ORDER — OXYCODONE HCL 5 MG PO TABS
5.0000 mg | ORAL_TABLET | ORAL | Status: AC | PRN
Start: 2020-04-20 — End: 2020-04-22
  Administered 2020-04-20 – 2020-04-22 (×2): 5 mg via ORAL
  Filled 2020-04-20 (×2): qty 1

## 2020-04-20 MED ORDER — SODIUM CHLORIDE 0.9% IV SOLUTION: Freq: Once | INTRAVENOUS | Status: AC

## 2020-04-20 MED ORDER — TRAZODONE HCL 50 MG PO TABS
50.0000 mg | ORAL_TABLET | Freq: Every day | ORAL | Status: AC
  Administered 2020-04-21: 50 mg via ORAL
  Filled 2020-04-20: qty 1

## 2020-04-20 NOTE — Telephone Encounter (Signed)
R/s appt per 4/18 sch msg. Pt aware.  

## 2020-04-20 NOTE — Progress Notes (Signed)
Patient to be admitted to [address].  Report called to [facility], RN  Made her aware of platelet count 9k. And K+of 2.9  Transported via w/c to room 1609. She is accompanied by her friend, Maudie Mercury. Call made to Kai Levins, her son and informed him of her admission. He is very grateful for all the help his mother has received.

## 2020-04-20 NOTE — Progress Notes (Signed)
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 Found to have pancytopenia and peripheral blasts with intracellular inclusions.  2) 05/01/2019: transferred to 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 4) 05/16/2019: repeat Bmbx shows no residual leukemia 5) 06/17/2019: patient returned for Cycle 1 Consolidation with cytarabine and gemtuzumab 6) 06/24/2019: presents to reconnect with [facility] for co-managed care. 7) 07/15/2019: Cycle 2 of consolidation therapy  8) 07/30/2019: admitted for BRBPR/ Infectious colitis.  9) 08/26/2019: start of Cycle 3 of consolidation therapy at [facility]  10) 09/09/2019: admitted with neutropenic fever and BRBPR 11) 10/23/2019: evaluated by Dr. Elmo Putt. After discussion with him decided against oral azacitidine therapy 12) 02/17/2020-04/15/2020: admitted to [facility]. Treated with Decitabine/Venetoclax x 2 cycles  Interval History:  Kelly Roberson 60 y.o. female with medical history significant for AML s/p induction therapy and 3 cycles of consolidation with subsequent relapse and treatment with Decitabine/Venetoclax who presents for a follow up visit. The patient's last visit was on 12/02/2019. In the interim she was admitted to [facility] for 2 months time for treatment of her relapsed AML.  On exam today Kelly Roberson is accompanied by a friend.  She reports that over the weekend she slept "a lot".  She has not had much in the way of appetite.  She reports that she has not been having any lightheadedness, dizziness, or falls.  She is having swelling of her lower extremities and reports that her  feet are having trouble fitting of slippers.  She is not having any overt signs of bleeding though she does have considerable bruising from her fall over her right shoulder and eye.  Otherwise she reports that she is not having issues with fevers, chills, sweats, nausea, vomiting or diarrhea.  A full 10 point ROS is listed below.  Of note the patient's sons took away her Dilaudid and this markedly improved her mental status as well as the stability on her feet.  She is still markedly deconditioned and is not safe to be at home by herself.  The bulk of our discussion was focused on having the patient admitted due to instability on her feet and her severe cytopenias.  The full details of this conversation are listed below, however at the end of the conversation the patient was found to have decision-making capacity and we honored her wishes of allowing her to return home despite the fact we did not believe this was a safe plan.  MEDICAL HISTORY:  Past Medical History:  Diagnosis Date  . Anxiety   . Cancer ([place])   . Hepatitis B   . Hepatitis C   . Hypertension   . IBS (irritable bowel syndrome)   . RLS (restless legs syndrome) 01/09/2020    SURGICAL HISTORY: Past Surgical History:  Procedure Laterality Date  . GASTRIC BYPASS  2013  . NISSEN FUNDOPLICATION  2956   Reversed in 2013  . ORIF ANKLE FRACTURE Left 06/2017  . PLACEMENT OF BREAST IMPLANTS      SOCIAL HISTORY: Social History   Socioeconomic History  . Marital status: Divorced    Spouse name: Not on file  . Number of children: Not on file  . Years of education:  Not on file  . Highest education level: Not on file  Occupational History  . Occupation: disability  Tobacco Use  . Smoking status: Former Research scientist (life sciences)  . Smokeless tobacco: Never Used  Vaping Use  . Vaping Use: Never used  Substance and Sexual Activity  . Alcohol use: Not Currently    Comment: occasionally   . Drug use: Not Currently  . Sexual activity: Not  Currently  Other Topics Concern  . Not on file  Social History Narrative   Lives alone   Right Handed   Drinks 2-3 cups caffeine daily   Social Determinants of Health   Financial Resource Strain: Not on file  Food Insecurity: Not on file  Transportation Needs: Not on file  Physical Activity: Not on file  Stress: Not on file  Social Connections: Not on file  Intimate Partner Violence: Not on file    FAMILY HISTORY: Family History  Problem Relation Age of Onset  . Pancreatic cancer Father   . Colon cancer Maternal Aunt   . Diabetes Maternal Uncle   . Pancreatic cancer Maternal Grandfather     ALLERGIES:  is allergic to ace inhibitors, lisinopril, and tylenol [acetaminophen].  MEDICATIONS:  No current facility-administered medications for this visit.   No current outpatient medications on file.   Facility-Administered Medications Ordered in Other Visits  Medication Dose Route Frequency Provider Last Rate Last Admin  . acyclovir (ZOVIRAX) tablet 400 mg  400 mg Oral BID Little Ishikawa, MD   400 mg at 04/21/20 0946  . ALPRAZolam Duanne Moron) tablet 0.5 mg  0.5 mg Oral QHS PRN Little Ishikawa, MD      . cholecalciferol (VITAMIN D) tablet 2,000 Units  2,000 Units Oral Daily Little Ishikawa, MD   2,000 Units at 04/21/20 [phone]  . escitalopram (LEXAPRO) tablet 5 mg  5 mg Oral Daily Little Ishikawa, MD   5 mg at 04/21/20 0946  . ondansetron (ZOFRAN) injection 4 mg  4 mg Intravenous Q6H PRN Opyd, Ilene Qua, MD   4 mg at 04/21/20 0427  . oxyCODONE (Oxy IR/ROXICODONE) immediate release tablet 5 mg  5 mg Oral Q4H PRN Opyd, Ilene Qua, MD   5 mg at 04/20/20 2252  . oxyCODONE (OXYCONTIN) 12 hr tablet 20 mg  20 mg Oral Q12H Little Ishikawa, MD   20 mg at 04/21/20 0946  . pantoprazole (PROTONIX) EC tablet 40 mg  40 mg Oral Daily Little Ishikawa, MD   40 mg at 04/21/20 0946  . posaconazole (NOXAFIL) delayed-release tablet 300 mg  300 mg Oral Daily Holli Humbles C,  MD      . potassium chloride 10 mEq in 100 mL IVPB  10 mEq Intravenous Q1 Hr x 2 Little Ishikawa, MD 100 mL/hr at 04/21/20 0946 10 mEq at 04/21/20 0946  . potassium chloride SA (KLOR-CON) CR tablet 40 mEq  40 mEq Oral BID Little Ishikawa, MD      . pregabalin (LYRICA) capsule 150 mg  150 mg Oral BID Little Ishikawa, MD   150 mg at 04/21/20 0946  . traZODone (DESYREL) tablet 50 mg  50 mg Oral QHS Little Ishikawa, MD        REVIEW OF SYSTEMS:   Constitutional: ( - ) fevers, ( - )  chills , ( - ) night sweats Eyes: ( - ) blurriness of vision, ( - ) double vision, ( - ) watery eyes Ears, nose, mouth, throat, and face: ( - )  mucositis, ( - ) sore throat Respiratory: ( - ) cough, ( - ) dyspnea, ( - ) wheezes Cardiovascular: ( - ) palpitation, ( - ) chest discomfort, ( - ) lower extremity swelling Gastrointestinal:  ( - ) nausea, ( - ) heartburn, ( - ) change in bowel habits Skin: ( - ) abnormal skin rashes Lymphatics: ( - ) new lymphadenopathy, ( - ) easy bruising Neurological: ( - ) numbness, ( - ) tingling, ( - ) new weaknesses Behavioral/Psych: ( - ) mood change, ( - ) new changes  All other systems were reviewed with the patient and are negative.  PHYSICAL EXAMINATION: ECOG PERFORMANCE STATUS: 1 - Symptomatic but completely ambulatory  Vitals:   04/20/20 1149  BP: (!) 135/58  Pulse: 77  Resp: 17  Temp: 97.8 F (36.6 C)  SpO2: 93%   Filed Weights   04/20/20 1149  Weight: 185 lb 3.2 oz (84 kg)    GENERAL: chronically ill appearing middle aged Caucasian female, alert, no distress and comfortable SKIN: skin color, texture, turgor are normal, no rashes or significant lesions EYES: conjunctiva are pink and non-injected, sclera clear LUNGS: clear to auscultation and percussion with normal breathing effort HEART: regular rate & rhythm and no murmurs and no lower extremity edema Musculoskeletal: no cyanosis of digits and no clubbing  PSYCH: alert & oriented x 3,  fluent speech NEURO: no focal motor/sensory deficits  LABORATORY DATA:  I have reviewed the data as listed CBC Latest Ref Rng & Units 04/21/2020 04/20/2020 04/17/2020  WBC 4.0 - 10.5 K/uL 1.0(LL) 1.0(L) 0.6(LL)  Hemoglobin 12.0 - 15.0 g/dL 7.2(L) 8.1(L) 8.5(L)  Hematocrit 36.0 - 46.0 % 21.3(L) 23.3(L) 25.7(L)  Platelets 150 - 400 K/uL 15(LL) 9(LL) 17(L)    CMP Latest Ref Rng & Units 04/21/2020 04/20/2020 04/17/2020  Glucose 70 - 99 mg/dL 118(H) 104(H) 103(H)  BUN 8 - 23 mg/dL <5(L) <4(L) 4(L)  Creatinine 0.44 - 1.00 mg/dL 0.53 0.63 0.66  Sodium 135 - 145 mmol/L 140 142 140  Potassium 3.5 - 5.1 mmol/L 2.4(LL) 2.9(L) 3.4(L)  Chloride 98 - 111 mmol/L 104 101 102  CO2 22 - 32 mmol/L '29 26 27  ' Calcium 8.9 - 10.3 mg/dL 7.8(L) 8.3(L) 8.2(L)  Total Protein 6.5 - 8.1 g/dL 4.9(L) 5.6(L) 5.7(L)  Total Bilirubin 0.3 - 1.2 mg/dL 0.9 1.0 1.0  Alkaline Phos 38 - 126 U/L 137(H) 186(H) 239(H)  AST 15 - 41 U/L '21 25 21  ' ALT 0 - 44 U/L '12 12 12    ' RADIOGRAPHIC STUDIES: DG Shoulder Right  Result Date: 04/16/2020 CLINICAL DATA:  Fall, pain, bruising EXAM: RIGHT SHOULDER - 2+ VIEW; RIGHT HUMERUS - 2+ VIEW; RIGHT FOREARM - 2 VIEW COMPARISON:  None. FINDINGS: No fracture or dislocation of the right shoulder. Mild acromioclavicular and glenohumeral arthrosis. The partially included right chest is unremarkable. No fracture or dislocation of the right humerus. No fracture or dislocation of the right radius or ulna. IMPRESSION: 1. No fracture or dislocation of the right shoulder. Mild acromioclavicular and glenohumeral arthrosis. 2. No fracture or dislocation of the right humerus. 3. No fracture or dislocation of the right radius or ulna. Electronically Signed   By: Eddie Candle M.D.   On: 04/16/2020 15:51   DG Forearm Right  Result Date: 04/16/2020 CLINICAL DATA:  Fall, pain, bruising EXAM: RIGHT SHOULDER - 2+ VIEW; RIGHT HUMERUS - 2+ VIEW; RIGHT FOREARM - 2 VIEW COMPARISON:  None. FINDINGS: No fracture or  dislocation of the right shoulder. Mild  acromioclavicular and glenohumeral arthrosis. The partially included right chest is unremarkable. No fracture or dislocation of the right humerus. No fracture or dislocation of the right radius or ulna. IMPRESSION: 1. No fracture or dislocation of the right shoulder. Mild acromioclavicular and glenohumeral arthrosis. 2. No fracture or dislocation of the right humerus. 3. No fracture or dislocation of the right radius or ulna. Electronically Signed   By: Eddie Candle M.D.   On: 04/16/2020 15:51   CT Head Wo Contrast  Result Date: 04/16/2020 CLINICAL DATA:  Fall, hematoma to right eyebrow EXAM: CT HEAD WITHOUT CONTRAST TECHNIQUE: Contiguous axial images were obtained from the base of the skull through the vertex without intravenous contrast. COMPARISON:  None. FINDINGS: Brain: No evidence of acute infarction, hemorrhage, hydrocephalus, extra-axial collection or mass lesion/mass effect. Vascular: No hyperdense vessel or unexpected calcification. Skull: Normal. Negative for fracture or focal lesion. Sinuses/Orbits: No acute finding. Other: Soft tissue contusion of the right brow (series 3, image 5). IMPRESSION: 1. No acute intracranial pathology. 2. Soft tissue contusion of the right brow. Electronically Signed   By: Eddie Candle M.D.   On: 04/16/2020 15:06   DG Humerus Right  Result Date: 04/16/2020 CLINICAL DATA:  Fall, pain, bruising EXAM: RIGHT SHOULDER - 2+ VIEW; RIGHT HUMERUS - 2+ VIEW; RIGHT FOREARM - 2 VIEW COMPARISON:  None. FINDINGS: No fracture or dislocation of the right shoulder. Mild acromioclavicular and glenohumeral arthrosis. The partially included right chest is unremarkable. No fracture or dislocation of the right humerus. No fracture or dislocation of the right radius or ulna. IMPRESSION: 1. No fracture or dislocation of the right shoulder. Mild acromioclavicular and glenohumeral arthrosis. 2. No fracture or dislocation of the right humerus. 3. No  fracture or dislocation of the right radius or ulna. Electronically Signed   By: Eddie Candle M.D.   On: 04/16/2020 15:51    ASSESSMENT & PLAN Kelly Roberson 60 y.o. female with medical history significant for AML s/p induction therapy/HIDAC consolidation with subseuquent recurrence s/p decitabine/venetoclax who presents for a follow up visit.  We are happy to have Ms. Roberson return to clinic after her long stay at [facility].  Previously we discussed the nature of comanaged care in the treatment of acute myeloid leukemia.  Comanaged care involves the treatment recommendations and administration of chemotherapy by a specialized academic cancer center with local support here at our community hospital.  We would be happy to provide services including emergency care, medication refills, blood tests, transfusion, and supportive care such as G-CSF in the management of her AML.  Major clinical decisions regarding the treatment of the leukemia itself are deferred to [facility].  We are also able to provide imaging services as well as bone marrow biopsy if required.  On exam today Ms. Grabert is not safe to go home.  She is unstable on her feet and had a fall on 04/16/2020 at which time she said to the emergency department.  Fortunately she does not have any evidence of brain bleed despite having a marked thrombocytopenia.  The bulk of our discussion today focused on trying to get her admitted back to the hospital as being at home with her blood counts as they are and her unstable gait is not a safe combination.  She was wiling to be admitted today.   # Weakness/Deconditioning #Fall From Standing --Patient was taken to the emergency room on 04/16/2020 for a fall from standing with trauma to the head. --This occurred 1 day  after discharge from [facility].  Her admission there lasted approximately 2 months. --Patient declined to be admitted to the hospital at that  time. --Today we strongly encouraged the patient for consideration of admission and evaluation for consideration of rehab facility. --Unfortunately despite the protestations of her sons the patient declined to be admitted despite her unstable gait and severe weakness --My nurse and myself strongly encourage the patient to be admitted as I do not believe it is safe for her to go home with such marked thrombocytopenia, anemia, neutropenia and weakness and unstable gait.  Please see my nurses note for further detail regarding this discussion on 4/15 --The patient was agreeable to admission today.  We admitted her directly from clinic up to the 6th floor of [facility].  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 Elmo Putt providing recommendations. We are happy to provide blood checks and treatment for urgent issues locally. --patient completed induction and 3 cycles of consolidation therapy. Unfortunately she had relapsed disease and was treated with decitabine/Venetoclax in inpatient setting, with undetectable blasts at end of treatment.  -- Her Hgb and Plt are quite low, but not at the point of transfusion today. .  --continue f/u at [facility] with Dr. Elmo Putt. We will continue to monitor her closely locally.  No orders of the defined types were placed in this encounter.  All questions were answered. The patient knows to call the clinic with any problems, questions or concerns.  A total of more than 30 minutes were spent on this encounter and over half of that time was spent on counseling and coordination of care as outlined above.   Ledell Peoples, MD Department of Hematology/Oncology [place] [facility] Phone: [phone] Pager: [phone] Email: [email or web address]  04/21/2020 10:22 AM

## 2020-04-20 NOTE — H&P (Signed)
History and Physical    Kelly Roberson AYT:[id number] DOB: 15-May-1960 DOA: 04/20/2020  PCP: Kristie Cowman, MD   Chief Complaint: Weakness, ambulatory dysfunction, falls  HPI: Kelly Roberson is a 60 y.o. female with medical history significant of Acute Myeloid leukemia having been admitted to [facility] for the last 60 days. Was discharged home for 24h before she presented to oncology for further evaluation for ongoing weakness. She declined admission to hospital at that point but now presents again to oncology for ongoing weakness who is requesting admission to the hospital. Comorbid conditions include - HTN, Anxiety, Hepatitis B/C, IBS, RLS.   Timeline of events per oncology follow up: 1) 04/30/2019: patient presented to [facility] ED with progressive weakness. Found to have pancytopenia and peripheral blasts with intracellular inclusions.  2) 05/01/2019: transferred to [facility] 3) 05/04/2019: patient started induction therapy for AML with 7+3 and gemtuzumab on Day 4. 4) 05/16/2019: repeat Bmbx shows no residual leukemia 5) 06/17/2019: patient returned for Cycle 1 Consolidation with cytarabine and gemtuzumab 6) 06/24/2019: presents to reconnect with [facility] for co-managed care. 7) 07/15/2019: Cycle 2 of consolidation therapy  8) 07/30/2019: admitted for BRBPR/ Infectious colitis.  9) 08/26/2019: start of Cycle 3 of consolidation therapy at [facility]  10) 09/09/2019: admitted withneutropenicfever and BRBPR 11) 10/23/2019: evaluated by Dr. Elmo Putt. After discussion with him decided against oral azacitidine therapy 12) 02/17/2020-04/15/2020: admitted to [facility]. Treated with Decitabine/Venetoclax x 2 cycles 13) Presented to [facility] ED 04/16/20 for weakness/fall/ambulatory dysfunction. Refused admission and returned home despite discussion with ED and Oncology physician. 14) Presents to oncology again today in the office for ongoing weakness - admitted directly to hospital for PT evaluation and platelet  transfusion to be evaluated for placement.  Review of Systems: As per HPI admits to ongoing weakness, denies headache, fevers, chills, nausea, vomiting, diarrhea, or constipation.  Assessment/Plan Active Problems:   Symptomatic anemia  Ambulatory dysfunction, mechanical fall - Patient was taken to the emergency room on 04/16/2020 for a fall from standing with trauma to the head - negative workup at that time. - This occurred 1 day after discharge from [facility]. Her admission there lasted approximately 2 months. - Patient declined to be admitted to the hospital at that time - agreeable to admission today. - PT to evaluate for possible needs/placement  Acute on chronic thrombocytopenia; chronic anemia Secondary to below - Secondary to below - Transfuse 1 pool platelets today; follow am labs - Goal hemoglobin 8, goal platelet 10  Myeloid Leukemia, Favorable Risk  (RUNX1/RUNX1T1) - Continue f/u at [facility] with Dr. Elmo Putt.  - Follows locally with Dr Lorenso Courier -  twice weekly blood draws with q 2 week clinic visits.   DVT prophylaxis: None - SCDs only  Code Status: Full  Family Communication: None present  Status is: Inpt  Dispo: The patient is from: Home              Anticipated d/c is to: TBD              Anticipated d/c date is: 24-48h              Patient currently is medically stable for discharge pending PT evaluation and repeat labs  Consultants:   Oncology  Procedures:   None   Past Medical History:  Diagnosis Date  . Anxiety   . Cancer ([place])   . Hepatitis B   . Hepatitis C   . Hypertension   . IBS (irritable bowel  syndrome)   . RLS (restless legs syndrome) 01/09/2020    Past Surgical History:  Procedure Laterality Date  . GASTRIC BYPASS  2013  . NISSEN FUNDOPLICATION  1610   Reversed in 2013  . ORIF ANKLE FRACTURE Left 06/2017  . PLACEMENT OF BREAST IMPLANTS       reports that she has quit smoking. She has never used smokeless  tobacco. She reports previous alcohol use. She reports previous drug use.  Allergies  Allergen Reactions  . Ace Inhibitors     Other reaction(s): Cough  . Lisinopril Cough  . Tylenol [Acetaminophen] Hives and Itching    Pt. States she only itches from tylenol     Family History  Problem Relation Age of Onset  . Pancreatic cancer Father   . Colon cancer Maternal Aunt   . Diabetes Maternal Uncle   . Pancreatic cancer Maternal Grandfather     Prior to Admission medications   Medication Sig Start Date End Date Taking? Authorizing Provider  acyclovir (ZOVIRAX) 400 MG tablet Take 400 mg by mouth 2 (two) times daily.    [provider]  ALPRAZolam Duanne Moron) 0.5 MG tablet Take 0.5 mg by mouth at bedtime as needed. 11/26/19   [provider]  cholecalciferol (VITAMIN D) 25 MCG (1000 UNIT) tablet Take 2,000 Units by mouth daily.    [provider]  DULoxetine (CYMBALTA) 20 MG capsule Take 20 mg by mouth daily.    [provider]  escitalopram (LEXAPRO) 10 MG tablet Take 10 mg by mouth daily. Take 1/2 tablet daily    [provider]  HYDROmorphone (DILAUDID) 2 MG tablet Take 2 mg by mouth every 4 (four) hours as needed for severe pain. Patient not taking: Reported on 04/20/2020    [provider]  levofloxacin (LEVAQUIN) 500 MG tablet Take 500 mg by mouth daily.    [provider]  methocarbamol (ROBAXIN) 500 MG tablet Take 1,000 mg by mouth 4 (four) times daily. As needed    [provider]  oxyCODONE (OXYCONTIN) 20 mg 12 hr tablet Take 20 mg by mouth every 12 (twelve) hours.    [provider]  pantoprazole (PROTONIX) 40 MG tablet Take 40 mg by mouth daily.    [provider]  POSACONAZOLE PO Take 100 mg by mouth daily. Take 3 tablets daily (300 mg)    [provider]  pregabalin (LYRICA) 150 MG capsule Take 150 mg by mouth 3 (three) times daily.    [provider]  topiramate (TOPAMAX)  25 MG tablet Take 1 tablet (25 mg total) by mouth 2 (two) times daily. 02/11/20   Garvin Fila, MD  traZODone (DESYREL) 100 MG tablet Take 100 mg by mouth at bedtime. 11/20/19   [provider]    Physical Exam: Vitals:   04/20/20 1715 04/20/20 1748 04/20/20 1749 04/20/20 1935  BP: (!) 144/71 138/69 138/69 (!) 149/70  Pulse: 80 73 73 74  Resp: 20 18 18 16   Temp: 98 F (36.7 C) 98.3 F (36.8 C) 98.3 F (36.8 C) 98.4 F (36.9 C)  TempSrc: Oral  Oral Oral  SpO2: 93%  93% 91%  Weight:      Height:        Constitutional: NAD, calm, comfortable Vitals:   04/20/20 1715 04/20/20 1748 04/20/20 1749 04/20/20 1935  BP: (!) 144/71 138/69 138/69 (!) 149/70  Pulse: 80 73 73 74  Resp: 20 18 18 16   Temp: 98 F (36.7 C) 98.3 F (36.8  C) 98.3 F (36.8 C) 98.4 F (36.9 C)  TempSrc: Oral  Oral Oral  SpO2: 93%  93% 91%  Weight:      Height:       General:  Pleasantly resting in bed, No acute distress. HEENT:  Normocephalic atraumatic.  Sclerae nonicteric, noninjected.  Extraocular movements intact bilaterally. Neck:  Without mass or deformity.  Trachea is midline. Lungs:  Clear to auscultate bilaterally without rhonchi, wheeze, or rales. Heart:  Regular rate and rhythm.  Without murmurs, rubs, or gallops. Abdomen:  Soft, nontender, nondistended.  Without guarding or rebound. Extremities: Without cyanosis, clubbing, 2+ pitting edema at ankles. Vascular:  Dorsalis pedis and posterior tibial pulses palpable bilaterally. Skin:  Warm and dry, multiple petechiae, purpura and ecchymoses throughout skin exam.  Labs on Admission: I have personally reviewed following labs and imaging studies  CBC: Recent Labs  Lab 04/16/20 1323 04/17/20 1230 04/20/20 1117  WBC 0.7* 0.6* 1.0*  NEUTROABS 0.2* 0.2* 0.1*  HGB 8.4* 8.5* 8.1*  HCT 25.6* 25.7* 23.3*  MCV 95.5 93.1 89.6  PLT 19* 17* 9*   Basic Metabolic Panel: Recent Labs  Lab 04/16/20 1323 04/17/20 1230 04/20/20 1117  NA 139  140 142  K 3.8 3.4* 2.9*  CL 104 102 101  CO2 28 27 26   GLUCOSE 101* 103* 104*  BUN 6* 4* <4*  CREATININE 0.68 0.66 0.63  CALCIUM 8.5* 8.2* 8.3*   GFR: Estimated Creatinine Clearance: 83.8 mL/min (by C-G formula based on SCr of 0.63 mg/dL). Liver Function Tests: Recent Labs  Lab 04/16/20 1323 04/17/20 1230 04/20/20 1117  AST 23 21 25   ALT 16 12 12   ALKPHOS 205* 239* 186*  BILITOT 1.1 1.0 1.0  PROT 5.4* 5.7* 5.6*  ALBUMIN 2.7* 2.9* 3.0*   No results for input(s): LIPASE, AMYLASE in the last 168 hours. No results for input(s): AMMONIA in the last 168 hours. Coagulation Profile: No results for input(s): INR, PROTIME in the last 168 hours. Cardiac Enzymes: No results for input(s): CKTOTAL, CKMB, CKMBINDEX, TROPONINI in the last 168 hours. BNP (last 3 results) No results for input(s): PROBNP in the last 8760 hours. HbA1C: No results for input(s): HGBA1C in the last 72 hours. CBG: No results for input(s): GLUCAP in the last 168 hours. Lipid Profile: No results for input(s): CHOL, HDL, LDLCALC, TRIG, CHOLHDL, LDLDIRECT in the last 72 hours. Thyroid Function Tests: No results for input(s): TSH, T4TOTAL, FREET4, T3FREE, THYROIDAB in the last 72 hours. Anemia Panel: No results for input(s): VITAMINB12, FOLATE, FERRITIN, TIBC, IRON, RETICCTPCT in the last 72 hours. Urine analysis:    Component Value Date/Time   COLORURINE YELLOW 09/10/2019 1202   APPEARANCEUR CLEAR 09/10/2019 1202   LABSPEC 1.027 09/10/2019 1202   PHURINE 5.0 09/10/2019 1202   GLUCOSEU 50 (A) 09/10/2019 1202   HGBUR LARGE (A) 09/10/2019 1202   BILIRUBINUR NEGATIVE 09/10/2019 1202   KETONESUR NEGATIVE 09/10/2019 1202   PROTEINUR NEGATIVE 09/10/2019 1202   NITRITE NEGATIVE 09/10/2019 1202   LEUKOCYTESUR NEGATIVE 09/10/2019 1202    Radiological Exams on Admission: No results found.   Little Ishikawa DO [facility] For contact please use secure messenger on Epic  If 7PM-7AM, please  contact night-coverage located on [email or web address]   04/20/2020, 8:52 PM

## 2020-04-21 ENCOUNTER — Encounter: Payer: Self-pay | Admitting: Hematology and Oncology

## 2020-04-21 DIAGNOSIS — E440 Moderate protein-calorie malnutrition: Secondary | ICD-10-CM

## 2020-04-21 DIAGNOSIS — D649 Anemia, unspecified: Secondary | ICD-10-CM | POA: Diagnosis not present

## 2020-04-21 LAB — COMPREHENSIVE METABOLIC PANEL
ALT: 12 U/L (ref 0–44)
AST: 21 U/L (ref 15–41)
Albumin: 2.7 g/dL — ABNORMAL LOW (ref 3.5–5.0)
Alkaline Phosphatase: 137 U/L — ABNORMAL HIGH (ref 38–126)
Anion gap: 7 (ref 5–15)
BUN: <5 mg/dL — ABNORMAL LOW (ref 8–23)
CO2: 29 mmol/L (ref 22–32)
Calcium: 7.8 mg/dL — ABNORMAL LOW (ref 8.9–10.3)
Chloride: 104 mmol/L (ref 98–111)
Creatinine, Ser: 0.53 mg/dL (ref 0.44–1.00)
GFR, Estimated: >60 mL/min (ref >60)
Glucose, Bld: 118 mg/dL — ABNORMAL HIGH (ref 70–99)
Potassium: 2.4 mmol/L — CL (ref 3.5–5.1)
Sodium: 140 mmol/L (ref 135–145)
Total Bilirubin: 0.9 mg/dL (ref 0.3–1.2)
Total Protein: 4.9 g/dL — ABNORMAL LOW (ref 6.5–8.1)

## 2020-04-21 LAB — CBC
HCT: 21.3 % — ABNORMAL LOW (ref 36.0–46.0)
Hemoglobin: 7.2 g/dL — ABNORMAL LOW (ref 12.0–15.0)
MCH: 31.3 pg (ref 26.0–34.0)
MCHC: 33.8 g/dL (ref 30.0–36.0)
MCV: 92.6 fL (ref 80.0–100.0)
Platelets: 15 10*3/uL — CL (ref 150–400)
RBC: 2.3 MIL/uL — ABNORMAL LOW (ref 3.87–5.11)
RDW: 15.2 % (ref 11.5–15.5)
WBC: 1 10*3/uL — CL (ref 4.0–10.5)
nRBC: 2 % — ABNORMAL HIGH (ref 0.0–0.2)

## 2020-04-21 LAB — PREPARE PLATELET PHERESIS: Unit division: 0

## 2020-04-21 LAB — SARS CORONAVIRUS 2 (TAT 6-24 HRS): SARS Coronavirus 2: NEGATIVE

## 2020-04-21 LAB — BPAM PLATELET PHERESIS
Blood Product Expiration Date: 202204202359
ISSUE DATE / TIME: 202204181721
Unit Type and Rh: 7300

## 2020-04-21 LAB — PROTIME-INR
INR: 1.3 — ABNORMAL HIGH (ref 0.8–1.2)
Prothrombin Time: 16.6 seconds — ABNORMAL HIGH (ref 11.4–15.2)

## 2020-04-21 MED ORDER — ADULT MULTIVITAMIN W/MINERALS CH
1.0000 | ORAL_TABLET | Freq: Every day | ORAL | Status: DC
  Administered 2020-04-21 – 2020-04-29 (×9): 1 via ORAL
  Filled 2020-04-21 (×10): qty 1

## 2020-04-21 MED ORDER — POTASSIUM CHLORIDE CRYS ER 20 MEQ PO TBCR
40.0000 meq | EXTENDED_RELEASE_TABLET | Freq: Two times a day (BID) | ORAL | Status: DC
  Filled 2020-04-21: qty 2

## 2020-04-21 MED ORDER — TRAZODONE HCL 50 MG PO TABS
50.0000 mg | ORAL_TABLET | Freq: Every day | ORAL | Status: DC
  Administered 2020-04-21 – 2020-04-29 (×5): 50 mg via ORAL
  Filled 2020-04-21 (×8): qty 1

## 2020-04-21 MED ORDER — VITAMIN D3 25 MCG (1000 UNIT) PO TABS
2000.0000 [IU] | ORAL_TABLET | Freq: Every day | ORAL | Status: DC
  Administered 2020-04-21 – 2020-04-29 (×9): 2000 [IU] via ORAL
  Filled 2020-04-21 (×10): qty 2

## 2020-04-21 MED ORDER — PREGABALIN 75 MG PO CAPS
150.0000 mg | ORAL_CAPSULE | Freq: Two times a day (BID) | ORAL | Status: DC
  Administered 2020-04-21 – 2020-04-30 (×20): 150 mg via ORAL
  Filled 2020-04-21 (×21): qty 2

## 2020-04-21 MED ORDER — POTASSIUM CHLORIDE 10 MEQ/100ML IV SOLN
10.0000 meq | INTRAVENOUS | Status: AC
  Administered 2020-04-21 (×2): 10 meq via INTRAVENOUS
  Filled 2020-04-21 (×2): qty 100

## 2020-04-21 MED ORDER — POTASSIUM CHLORIDE 20 MEQ PO PACK
40.0000 meq | PACK | ORAL | Status: AC
  Filled 2020-04-21 (×2): qty 2

## 2020-04-21 MED ORDER — ENSURE ENLIVE PO LIQD
237.0000 mL | Freq: Three times a day (TID) | ORAL | Status: DC
  Administered 2020-04-21 – 2020-04-30 (×19): 237 mL via ORAL

## 2020-04-21 MED ORDER — ONDANSETRON HCL 4 MG/2ML IJ SOLN
4.0000 mg | Freq: Four times a day (QID) | INTRAMUSCULAR | Status: DC | PRN
  Administered 2020-04-21 – 2020-04-30 (×5): 4 mg via INTRAVENOUS
  Filled 2020-04-21 (×5): qty 2

## 2020-04-21 MED ORDER — ACYCLOVIR 400 MG PO TABS
400.0000 mg | ORAL_TABLET | Freq: Two times a day (BID) | ORAL | Status: DC
  Administered 2020-04-21 – 2020-04-30 (×20): 400 mg via ORAL
  Filled 2020-04-21 (×21): qty 1

## 2020-04-21 MED ORDER — OXYCODONE HCL ER 20 MG PO T12A
20.0000 mg | EXTENDED_RELEASE_TABLET | Freq: Two times a day (BID) | ORAL | Status: DC
  Administered 2020-04-21 – 2020-04-30 (×20): 20 mg via ORAL
  Filled 2020-04-21 (×21): qty 1

## 2020-04-21 MED ORDER — ALPRAZOLAM 0.5 MG PO TABS
0.5000 mg | ORAL_TABLET | Freq: Every evening | ORAL | Status: DC | PRN
  Administered 2020-04-26 – 2020-04-27 (×2): 0.5 mg via ORAL
  Filled 2020-04-21 (×2): qty 1

## 2020-04-21 MED ORDER — ESCITALOPRAM OXALATE 10 MG PO TABS
5.0000 mg | ORAL_TABLET | Freq: Every day | ORAL | Status: DC
  Administered 2020-04-21 – 2020-04-29 (×9): 5 mg via ORAL
  Filled 2020-04-21 (×11): qty 1

## 2020-04-21 MED ORDER — POSACONAZOLE 100 MG PO TBEC
300.0000 mg | DELAYED_RELEASE_TABLET | Freq: Every day | ORAL | Status: DC
  Administered 2020-04-21 – 2020-04-30 (×10): 300 mg via ORAL
  Filled 2020-04-21 (×11): qty 3

## 2020-04-21 MED ORDER — PANTOPRAZOLE SODIUM 40 MG PO TBEC
40.0000 mg | DELAYED_RELEASE_TABLET | Freq: Every day | ORAL | Status: DC
  Administered 2020-04-21 – 2020-04-29 (×9): 40 mg via ORAL
  Filled 2020-04-21 (×10): qty 1

## 2020-04-21 NOTE — Progress Notes (Signed)
Date and time results received: 04/21/20  (7:04 AM Test:Potassium Critical Value: 2.4  Name of Provider Notified: Holli Humbles  Orders Received? Or Actions Taken?:Awaiting orders

## 2020-04-21 NOTE — Progress Notes (Signed)
Initial Nutrition Assessment  DOCUMENTATION CODES:  Non-severe (moderate) malnutrition in context of chronic illness  INTERVENTION:  Continue current diet regimen.  Add Ensure Enlive po TID, each supplement provides 350 kcal and 20 grams of protein.  Add MVI with minerals daily.  NUTRITION DIAGNOSIS:  Moderate Malnutrition related to chronic illness,cancer and cancer related treatments as evidenced by mild fat depletion,mild muscle depletion.  GOAL:  Patient will meet greater than or equal to 90% of their needs  MONITOR:  PO intake,Supplement acceptance,Labs,Weight trends  REASON FOR ASSESSMENT:  Malnutrition Screening Tool    ASSESSMENT:  60 yo female with a PMH of HTN, anxiety, Hep B & C, IBS, RLS, and acute myeloid leukemia having been admitted to [facility] for the last 60 days. Pt has been having ongoing weakness since 24 hrs after d/c at [facility]. She presents with symptomatic anemia and ambulatory dysfunction.  Spoke with pt at bedside. Pt reports eating better at home after her stay at [facility]. Per Epic, she only ate 20% of her breakfast this morning. She reports that she is sick of hospital food.  She reports a 10 lb weight loss since this began. Pt has had steady weight loss since 09/27/19. Pt has lost 13.5 lbs (6.7% BW) in 7 months. On exam, pt had some mild depletions.   Encouraged intake of protein at home. Pt reports having Ensure at home in the fridge. Encouraged her to take 2-3 per day if she does not feel like eating, and at least 1-2 per day in general to build some muscle and fat back. While at the hospital, recommend Ensure TID. Also recommend MVI with minerals.  Medications: Vitamin D 2000 units, oxycodone, Protonix, Klor-Con 40 mEq, zofran PRN (received twice today) Labs: reviewed; K 2.9, Glucose 104,   NUTRITION - FOCUSED PHYSICAL EXAM: Flowsheet Row Most Recent Value  Orbital Region Mild depletion  Upper Arm Region Mild depletion  Thoracic and Lumbar Region No  depletion  Buccal Region Mild depletion  Temple Region Mild depletion  Clavicle Bone Region Mild depletion  Clavicle and Acromion Bone Region No depletion  Scapular Bone Region No depletion  Dorsal Hand No depletion  Patellar Region No depletion  Anterior Thigh Region Mild depletion  Posterior Calf Region Mild depletion  Edema (RD Assessment) Moderate  Hair Reviewed  Eyes Reviewed  Mouth Reviewed  Skin Reviewed  Nails Reviewed     Diet Order:   Diet Order            Diet regular Room service appropriate? Yes; Fluid consistency: Thin  Diet effective now                EDUCATION NEEDS:  Education needs have been addressed  Skin:  Skin Assessment: Skin Integrity Issues: Skin Integrity Issues:: Incisions Incisions: R upper arm and R knee, both closed  Last BM:  unknown/PTA  Height:  Ht Readings from Last 1 Encounters:  04/20/20 5' 7.99" (1.727 m)   Weight:  Wt Readings from Last 1 Encounters:  04/20/20 84 kg   Ideal Body Weight:  63.6 kg  BMI:  Body mass index is 28.16 kg/m.  Estimated Nutritional Needs:  Kcal:  2000-2200 Protein:  100-115 grams Fluid:  >2 L  Derrel Nip, RD, LDN Registered Dietitian After Hours/Weekend Pager # in [place]

## 2020-04-21 NOTE — Evaluation (Signed)
Occupational Therapy Evaluation Patient Details Name: Kelly Roberson MRN: [id number] DOB: 19-Dec-1960 Today's Date: 04/21/2020    History of Present Illness Kelly Roberson is a 60 y.o. female with medical history significant of Acute Myeloid leukemia having been admitted to [facility] for the last 60 days. Was discharged home for 24h before she presented to oncology for further evaluation for ongoing weakness. She declined admission to hospital at that point but presented again to oncology for ongoing weakness. Reports fall at home. Comorbid conditions include - HTN, Anxiety, Hepatitis B/C, IBS, RLS.   Clinical Impression   [name] is a 60 year old woman admitted to hospital for weakness. On evaluation she exhibits functional strength of upper extremities but overall generalized weakness, decreased activity tolerance, impaired balance and LE edema. She is modified independent for bed mobility, min assist to stand min guard to ambulate with RW. She needed min assist to don socks, supervision for toileting and standing at the sink. Patient stepped away from walker to get to sink. Therapist cued patient to use walker and educated patient that balance and gait improved with use of walker. Patient needing overall min assist and supervision for all tasks. Patient will benefit from skilled OT services while in hospital to improve deficits and learn compensatory strategies as needed in order to return to PLOF.  Patient reports not not wanting to go to a facility at discharge despite not having 24/7 assistance at home. Patient reports fall at home due to tripping over a bag and reports she wasn't using the walker. Patient is at risk for falls and further injury and without significant assistance at home. Recommend short term rehab at discharge. IF patient refuses SNF placement recommend maximizing HH services.    Follow Up Recommendations  SNF    Equipment Recommendations  None recommended by OT     Recommendations for Other Services       Precautions / Restrictions Precautions Precautions: Fall Precaution Comments: hx of falls Restrictions Weight Bearing Restrictions: No      Mobility Bed Mobility Overal bed mobility: Modified Independent             General bed mobility comments: increased time to perform transfers. patient at side of bed when therapist entered the room.    Transfers Overall transfer level: Needs assistance Equipment used: Rolling walker (2 wheeled) Transfers: Sit to/from [facility] Sit to Stand: Min assist;From elevated surface Stand pivot transfers: Min guard       General transfer comment: unable to rise from low bed height. Min assist to stand from elevated bed height. Min guard to ambulate with RW.    Balance Overall balance assessment: Mild deficits observed, not formally tested                                         ADL either performed or assessed with clinical judgement   ADL Overall ADL's : Needs assistance/impaired Eating/Feeding: Independent   Grooming: Supervision/safety;Standing   Upper Body Bathing: Independent   Lower Body Bathing: Supervison/ safety;Cueing for compensatory techniques   Upper Body Dressing : Independent   Lower Body Dressing: Minimal assistance;Sit to/from stand Lower Body Dressing Details (indicate cue type and reason): min assist to don socks at side of bed - able to get them on with difficulty - assitance to get socks over all toes Toilet Transfer: Forensic scientist-  Clothing Manipulation and Hygiene: Supervision/safety;Sit to/from stand               Vision Patient Visual Report: No change from baseline       Perception     Praxis      Pertinent Vitals/Pain Pain Assessment: No/denies pain     Hand Dominance Right   Extremity/Trunk Assessment Upper Extremity Assessment Upper Extremity Assessment: Overall WFL for tasks  assessed   Lower Extremity Assessment Lower Extremity Assessment: Defer to PT evaluation   Cervical / Trunk Assessment Cervical / Trunk Assessment: Normal   Communication Communication Communication: No difficulties   Cognition Arousal/Alertness: Awake/alert Behavior During Therapy: WFL for tasks assessed/performed Overall Cognitive Status: Within Functional Limits for tasks assessed                                     General Comments       Exercises     Shoulder Instructions      Home Living Family/patient expects to be discharged to:: Private residence Living Arrangements: Alone Available Help at Discharge: Available PRN/intermittently Type of Home: House Home Access: Stairs to enter [employer] of Steps: 2   Home Layout: One level     Bathroom Shower/Tub: Teacher, early years/pre: Standard     Home Equipment: Environmental consultant - 4 wheels;Tub bench   Additional Comments: low toilet      Prior Functioning/Environment Level of Independence: Needs assistance  Gait / Transfers Assistance Needed: ambulate with and without walker. Was not using walker when she fell. ADL's / Homemaking Assistance Needed: Independent with ADLs. Has an aide 3 x a week that helps with household tasks - can help with bathing if needed.            OT Problem List: Decreased strength;Decreased activity tolerance;Impaired balance (sitting and/or standing);Increased edema      OT Treatment/Interventions: Self-care/ADL training;Therapeutic exercise;DME and/or AE instruction;Therapeutic activities;Balance training;Patient/family education    OT Goals(Current goals can be found in the care plan section) Acute Rehab OT Goals Patient Stated Goal: Did not state OT Goal Formulation: With patient Time For Goal Achievement: 05/05/20 Potential to Achieve Goals: Good ADL Goals Pt Will Perform Lower Body Dressing: Independently Pt Will Transfer to Toilet:  Independently Pt Will Perform Toileting - Clothing Manipulation and hygiene: Independently Additional ADL Goal #1: Patient will stand at sink x 5 min to perform grooming tasks as evidence of improving activity tolerance  OT Frequency: Min 2X/week   Barriers to D/C: Decreased caregiver support          Co-evaluation              AM-PAC OT "6 Clicks" Daily Activity     Outcome Measure Help from another person eating meals?: None Help from another person taking care of personal grooming?: A Little Help from another person toileting, which includes using toliet, bedpan, or urinal?: A Little Help from another person bathing (including washing, rinsing, drying)?: A Little Help from another person to put on and taking off regular upper body clothing?: None Help from another person to put on and taking off regular lower body clothing?: A Little 6 Click Score: 20   End of Session Equipment Utilized During Treatment: Rolling walker Nurse Communication:  (okay to see)  Activity Tolerance: Patient tolerated treatment well Patient left: in bed;with call bell/phone within reach;with bed alarm set  OT Visit Diagnosis: Unsteadiness  on feet (R26.81);History of falling (Z91.81);Muscle weakness (generalized) (M62.81)                Time: 2956-2130 OT Time Calculation (min): 19 min Charges:  OT General Charges $OT Visit: 1 Visit OT Evaluation $OT Eval Low Complexity: 1 Low  Stuti Sandin, OTR/L [place]  Office [phone] Pager: [place] 04/21/2020, 10:54 AM

## 2020-04-21 NOTE — Evaluation (Signed)
Physical Therapy Evaluation Patient Details Name: Kelly Roberson MRN: [id number] DOB: 10/15/1960 Today's Date: 04/21/2020   History of Present Illness  Kelly Roberson is a 60 y.o. female with medical history significant of Acute Myeloid leukemia having been admitted to [facility] for the last 60 days. Was discharged home for 24h before she presented to oncology for further evaluation for ongoing weakness. She declined admission to hospital at that point but presented again to oncology for ongoing weakness. Reports fall at home. Comorbid conditions include - HTN, Anxiety, Hepatitis B/C, IBS, RLS.  Clinical Impression  PT evaluation limited due to Patient crying, indicating IV burning. Summoned RN x 2 to slow rate of K+ run. Patient sitting on bed edge. Encouraged patient to mobilize, declined due to IV  Site pain . Patient just [place] from [facility] and had a fall after return home and back to ED. Patient reports 2 sons live out of town. Patient currently will benefit from having caregivers to assist as needed in ADLs, etc.  Pt admitted with above diagnosis.   Pt currently with functional limitations due to the deficits listed below (see PT Problem List). Pt will benefit from skilled PT to increase their independence and safety with mobility to allow discharge to the venue listed below.       Follow Up Recommendations SNF    Equipment Recommendations  None recommended by PT    Recommendations for Other Services       Precautions / Restrictions Precautions Precautions: Fall Precaution Comments: hx of falls Restrictions Weight Bearing Restrictions: No      Mobility  Bed Mobility Overal bed mobility: Needs Assistance Bed Mobility: Sit to Supine       Sit to supine: Supervision   General bed mobility comments: much encouragement to return to supine as patient not tolerating ambulation. No assistnace required    Transfers Overall transfer level: Needs assistance Equipment used: Rolling  walker (2 wheeled) Transfers: Sit to/from [facility] Sit to Stand: Min assist;From elevated surface Stand pivot transfers: Min guard       General transfer comment: NT , patient declined  Ambulation/Gait                Stairs            Wheelchair Mobility    Modified Rankin (Stroke Patients Only)       Balance Overall balance assessment: Mild deficits observed, not formally tested                                           Pertinent Vitals/Pain Pain Assessment: Faces Faces Pain Scale: Hurts worst Pain Location: Iv with K+ running Pain Descriptors / Indicators: Burning Pain Intervention(s): Limited activity within patient's tolerance (RN in to turn down x 2\)    Home Living Family/patient expects to be discharged to:: Private residence Living Arrangements: Alone Available Help at Discharge: Available PRN/intermittently Type of Home: House Home Access: Stairs to enter   [employer] of Steps: 2 Home Layout: One level Home Equipment: [place] - 4 wheels;Tub bench Additional Comments: low toilet    Prior Function Level of Independence: Needs assistance   Gait / Transfers Assistance Needed: ambulate with and without walker. Was not using walker when she fell. Reports she tripped over a bag  ADL's / Homemaking Assistance Needed: Independent with ADLs. Has an aide 3 x a week that helps  with household tasks - can help with bathing if needed.  Comments: REports not using rollator much.     Hand Dominance   Dominant Hand: Right    Extremity/Trunk Assessment   Upper Extremity Assessment Upper Extremity Assessment: Overall WFL for tasks assessed    Lower Extremity Assessment Lower Extremity Assessment: Generalized weakness (Bilateral ede,ma of lower legs)    Cervical / Trunk Assessment Cervical / Trunk Assessment: Normal  Communication   Communication: No difficulties  Cognition Arousal/Alertness:  Awake/alert Behavior During Therapy: Anxious;WFL for tasks assessed/performed Overall Cognitive Status: Within Functional Limits for tasks assessed                                 General Comments: reports IV pain is overwhelming and unable to  attempt ambulation      General Comments      Exercises     Assessment/Plan    PT Assessment Patient needs continued PT services  PT Problem List Decreased strength;Decreased mobility;Decreased safety awareness;Decreased knowledge of precautions;Decreased activity tolerance;Decreased balance;Pain       PT Treatment Interventions DME instruction;Therapeutic activities;Gait training;Therapeutic exercise;Patient/family education;Functional mobility training    PT Goals (Current goals can be found in the Care Plan section)  Acute Rehab PT Goals Patient Stated Goal: stop this burning IV PT Goal Formulation: With patient Time For Goal Achievement: 05/05/20 Potential to Achieve Goals: Fair    Frequency Min 2X/week   Barriers to discharge Decreased caregiver support      Co-evaluation               AM-PAC PT "6 Clicks" Mobility  Outcome Measure Help needed turning from your back to your side while in a flat bed without using bedrails?: None Help needed moving from lying on your back to sitting on the side of a flat bed without using bedrails?: None Help needed moving to and from a bed to a chair (including a wheelchair)?: A Lot Help needed standing up from a chair using your arms (e.g., wheelchair or bedside chair)?: A Lot Help needed to walk in hospital room?: A Lot Help needed climbing 3-5 steps with a railing? : A Lot 6 Click Score: 16    End of Session   Activity Tolerance: Patient limited by pain Patient left: in bed;with call bell/phone within reach;with bed alarm set Nurse Communication: Mobility status PT Visit Diagnosis: Muscle weakness (generalized) (M62.81);Difficulty in walking, not elsewhere  classified (R26.2);Pain;History of falling (Z91.81) Pain - Right/Left: Left Pain - part of body: Arm    Time: 1000-1024 PT Time Calculation (min) (ACUTE ONLY): 24 min   Charges:   PT Evaluation $PT Eval Low Complexity: 1 Low PT Treatments $Therapeutic Activity: 8-22 mins        Tresa Endo PT Acute Rehabilitation Services Pager [phone] Office [phone]   Claretha Cooper 04/21/2020, 12:43 PM

## 2020-04-21 NOTE — Discharge Instructions (Signed)
[place] Hospital Stay Proper nutrition can help your body recover from illness and injury.   Foods and beverages high in protein, vitamins, and minerals help rebuild muscle loss, promote healing, & reduce fall risk.   .In addition to eating healthy foods, a nutrition shake is an easy, delicious way to get the nutrition you need during and after your hospital stay  It is recommended that you continue to drink 2-3 bottles per day of: Ensure for at least 1 month (30 days) after your hospital stay   Tips for adding a nutrition shake into your routine: As allowed, drink one with vitamins or medications instead of water or juice Enjoy one as a tasty mid-morning or afternoon snack Drink cold or make a milkshake out of it Drink one instead of milk with cereal or snacks Use as a coffee creamer   Available at the following grocery stores and pharmacies:           * [place] [phone]            For COUPONS visit: www.ensure.com/join or http://dawson-may.com/   Suggested Substitutions Ensure Plus = Boost Plus = Carnation Breakfast Essentials = Boost Compact Ensure Active Clear = Boost Breeze Glucerna Shake = Boost Glucose Control = Carnation Breakfast Essentials SUGAR FREE  Suggestions For Increasing Calories And Protein . Several small meals a day are easier to eat and digest than three large ones. Space meals about 2 to 3 hours apart to maximize comfort. . Stop eating 2 to 3 hours before bed and sleep with your head elevated if gastric reflux (GERD) and heartburn are problems. . Do not eat your favorite foods if you are feeling bad. Save them for when you feel good! . Eat breakfast-type foods at any meal. Eggs are usually easy to eat and are great any time of the day. (The same goes for pancakes and  waffles.) . Eat when you feel hungry. Most people have the greatest appetite in the morning because they have not eaten all night. If this is the best meal for you, then pile on those calories and other nutrients in the morning and at lunch. Then you can have a smaller dinner without losing total calories for the day. . Eat leftovers or nutritious snacks in the afternoon and early evening to round out your day. . Try homemade or commercially prepared nutrition bars and puddings, as well as calorie- and protein-rich liquid nutritional supplements. Benefits of Physical Activity Talk to your doctor about physical activity. Light or moderate physical activity can help maintain muscle and promote an appetite. Walking in the neighborhood or the local mall is a great way to get up, get out, and get moving. If you are unsteady on your feet, try walking around the dining room table. Save Room for [employer]! Drink most fluids between meals instead of with meals. (It is fine to have a sip to help swallow food at meal time.) Fluids (which usually have fewer calories and nutrients than solid food) can take up valuable space in your stomach.  Foods Recommended High-Protein Foods Milk products Add cheese to toast, crackers, sandwiches, baked potatoes, vegetables, soups, noodles, meat, and fruit. Use reduced-fat (2%)  or whole milk in place of water when cooking cereal and cream soups. Include cream sauces on vegetables and pasta. Add powdered milk to cream soups and mashed potatoes.  Eggs Have hard-cooked eggs readily available in the refrigerator. Chop and add to salads, casseroles, soups, and vegetables. Make a quick egg salad. All eggs should be well cooked to avoid the risk of harmful bacteria.  Meats, poultry, and fish Add leftover cooked meats to soups, casseroles, salads, and omelets. Make dip by mixing diced, chopped, or shredded meat with sour cream and spices.  Beans, legumes, nuts, and seeds  Sprinkle nuts and seeds on cereals, fruit, and desserts such as ice cream, pudding, and custard. Also serve nuts and seeds on vegetables, salads, and pasta. Spread peanut butter on toast, bread, English muffins, and fruit, or blend it in a milk shake. Add beans and peas to salads, soups, casseroles, and vegetable dishes.  High-Calorie Foods Butter, margarine, and  oils Melt butter or margarine over potatoes, rice, pasta, and cooked vegetables. Add melted butter or margarine into soups and casseroles and spread on bread for sandwiches before spreading sandwich spread or peanut butter. Saut or stir-fry vegetables, meats, chicken and fish such as shrimp/scallops in olive or canola oil. A variety of oils add calories and can be used to [employer], chicken, or fish.  Milk products Add whipping cream to desserts, pancakes, waffles, fruit, and hot chocolate, and fold it into soups and casseroles. Add sour cream to baked potatoes and vegetables.  Salad dressing Use regular (not low-fat or diet) mayonnaise and salad dressing on sandwiches and in dips with vegetables and fruit.   Sweets Add jelly and honey to bread and crackers. Add jam to fruit and ice cream and as a topping over cake.   Copyright 2020  Academy of Nutrition and Dietetics. All rights reserved.

## 2020-04-21 NOTE — Progress Notes (Signed)
PROGRESS NOTE    Kelly Roberson  BZJ:[id number] DOB: 1960/03/23 DOA: 04/20/2020 PCP: Kristie Cowman, MD   Brief Narrative:  Kelly Roberson is a 60 y.o. female with medical history significant of Acute Myeloid leukemia having been admitted to [facility] for the last 60 days. Was discharged home for 24h before she presented to oncology for further evaluation for ongoing weakness. She declined admission to hospital at that point but now presents again to oncology for ongoing weakness who is requesting admission to the hospital. Comorbid conditions include - HTN, Anxiety, Hepatitis B/C, IBS, RLS.   Timeline of events per oncology follow up: 1) 04/30/2019: patient presented to [facility] ED with progressive weakness. Found to have pancytopenia and peripheral blasts with intracellular inclusions.  2) 05/01/2019: transferred to [facility] 3) 05/04/2019: patient started induction therapy for AML with 7+3 and gemtuzumab on Day 4. 4) 05/16/2019: repeat Bmbx shows no residual leukemia 5) 06/17/2019: patient returned for Cycle 1 Consolidation with cytarabine and gemtuzumab 6) 06/24/2019: presents to reconnect with [facility] for co-managed care. 7) 07/15/2019: Cycle 2 of consolidation therapy  8) 07/30/2019: admitted for BRBPR/ Infectious colitis.  9) 08/26/2019: start of Cycle 3 of consolidation therapy at [facility]  10) 09/09/2019: admitted withneutropenicfever and BRBPR 11) 10/23/2019: evaluated by Dr. Elmo Putt. After discussion with him decided against oral azacitidine therapy 12) 02/17/2020-04/15/2020: admitted to [facility]. Treated with Decitabine/Venetoclax x 2 cycles 13) Presented to [facility] ED 04/16/20 for weakness/fall/ambulatory dysfunction. Refused admission and returned home despite discussion with ED and Oncology physician. 14) Presents to oncology 04/20/20 in the office for ongoing weakness and increased falls- admitted directly to hospital for PT evaluation and platelet transfusion to be evaluated for  placement.  Assessment & Plan:   Active Problems:   Symptomatic anemia   Ambulatory dysfunction, mechanical fall - Patient was taken to the emergency room on 04/16/2020 for a fall from standing with trauma to the head - negative workup at that time. - This occurred 1 day after discharge from [facility]. Her admission there lasted approximately 2 months. - Patient declined to be admitted to the hospital at that time - agreeable to admission today. - PT to evaluate for possible needs/placement  Acute on chronic thrombocytopenia; chronic pancytopenia Secondary to below - Secondary to below - Transfuse 1 pool platelets at admission - Goal hemoglobin 8, goal platelet 10  Hypokalemia  -Unclear etiology, likely poor p.o. intake malnutrition, continue to advance diet as tolerated -Questionable episodes of nausea and vomiting per the patient prior to admission  Myeloid Leukemia, Favorable Risk (RUNX1/RUNX1T1) - Continue f/u at [facility] with Dr. Elmo Putt.  - Follows locally with Dr Lorenso Courier -  twice weekly blood draws with q 2 week clinic visits.  DVT prophylaxis: None - SCDs only given profound thrombocytopenia  Code Status: Full  Family Communication: None present  Status is: Inpt  Dispo: The patient is from: Home  Anticipated d/c is to: TBD  Anticipated d/c date is: 24-48h  Patient currently is medically stable for discharge pending PT evaluation and repeat labs  Consultants:   Oncology  Procedures:   None  Antimicrobials:  None   Subjective: No acute issues or events overnight denies chest pain shortness of breath vomiting diarrhea constipation headache fevers or chills.  She does report some nausea and poor p.o. intake with breakfast but otherwise continues to work with PT. she is still hesitant about discharge to SNF but we discussed she was not a safe discharge at home.  Continues to be a point of  contention between her and her sons.  Objective: Vitals:   04/20/20 1748 04/20/20 1749 04/20/20 1935 04/21/20 0300  BP: 138/69 138/69 (!) 149/70 (!) 144/64  Pulse: 73 73 74 73  Resp: 18 18 16 17   Temp: 98.3 F (36.8 C) 98.3 F (36.8 C) 98.4 F (36.9 C) 98.3 F (36.8 C)  TempSrc:  Oral Oral Oral  SpO2:  93% 91% 92%  Weight:      Height:        Intake/Output Summary (Last 24 hours) at 04/21/2020 0753 Last data filed at 04/20/2020 1935 Gross per 24 hour  Intake 306.5 ml  Output --  Net 306.5 ml   Filed Weights   04/20/20 1500  Weight: 84 kg    Examination:  General:  Pleasantly resting in bed, No acute distress. HEENT:  Normocephalic atraumatic.  Sclerae nonicteric, noninjected.  Extraocular movements intact bilaterally. Neck:  Without mass or deformity.  Trachea is midline. Lungs:  Clear to auscultate bilaterally without rhonchi, wheeze, or rales. Heart:  Regular rate and rhythm.  Without murmurs, rubs, or gallops. Abdomen:  Soft, nontender, nondistended.  Without guarding or rebound. Extremities: Without cyanosis, clubbing, 2+ pitting edema at ankles. Vascular:  Dorsalis pedis and posterior tibial pulses palpable bilaterally. Skin:  Warm and dry, multiple petechiae, purpura and ecchymoses throughout skin exam.   Data Reviewed: I have personally reviewed following labs and imaging studies  CBC: Recent Labs  Lab 04/16/20 1323 04/17/20 1230 04/20/20 1117 04/21/20 0606  WBC 0.7* 0.6* 1.0* 1.0*  NEUTROABS 0.2* 0.2* 0.1*  --   HGB 8.4* 8.5* 8.1* 7.2*  HCT 25.6* 25.7* 23.3* 21.3*  MCV 95.5 93.1 89.6 92.6  PLT 19* 17* 9* 15*   Basic Metabolic Panel: Recent Labs  Lab 04/16/20 1323 04/17/20 1230 04/20/20 1117 04/21/20 0606  NA 139 140 142 140  K 3.8 3.4* 2.9* 2.4*  CL 104 102 101 104  CO2 28 27 26 29   GLUCOSE 101* 103* 104* 118*  BUN 6* 4* <4* <5*  CREATININE 0.68 0.66 0.63 0.53  CALCIUM 8.5* 8.2* 8.3* 7.8*   GFR: Estimated Creatinine Clearance: 83.8  mL/min (by C-G formula based on SCr of 0.53 mg/dL). Liver Function Tests: Recent Labs  Lab 04/16/20 1323 04/17/20 1230 04/20/20 1117 04/21/20 0606  AST 23 21 25 21   ALT 16 12 12 12   ALKPHOS 205* 239* 186* 137*  BILITOT 1.1 1.0 1.0 0.9  PROT 5.4* 5.7* 5.6* 4.9*  ALBUMIN 2.7* 2.9* 3.0* 2.7*   No results for input(s): LIPASE, AMYLASE in the last 168 hours. No results for input(s): AMMONIA in the last 168 hours. Coagulation Profile: Recent Labs  Lab 04/21/20 0606  INR 1.3*   Cardiac Enzymes: No results for input(s): CKTOTAL, CKMB, CKMBINDEX, TROPONINI in the last 168 hours. BNP (last 3 results) No results for input(s): PROBNP in the last 8760 hours. HbA1C: No results for input(s): HGBA1C in the last 72 hours. CBG: No results for input(s): GLUCAP in the last 168 hours. Lipid Profile: No results for input(s): CHOL, HDL, LDLCALC, TRIG, CHOLHDL, LDLDIRECT in the last 72 hours. Thyroid Function Tests: No results for input(s): TSH, T4TOTAL, FREET4, T3FREE, THYROIDAB in the last 72 hours. Anemia Panel: No results for input(s): VITAMINB12, FOLATE, FERRITIN, TIBC, IRON, RETICCTPCT in the last 72 hours. Sepsis Labs: No results for input(s): PROCALCITON, LATICACIDVEN in the last 168 hours.  No results found for this or any previous visit (from the past 240 hour(s)).  Radiology Studies: No results found.  Scheduled Meds: . acyclovir  400 mg Oral BID  . cholecalciferol  2,000 Units Oral Daily  . escitalopram  5 mg Oral Daily  . oxyCODONE  20 mg Oral Q12H  . pantoprazole  40 mg Oral Daily  . posaconazole  300 mg Oral Daily  . potassium chloride  40 mEq Oral BID  . pregabalin  150 mg Oral BID  . traZODone  50 mg Oral QHS   Continuous Infusions: . potassium chloride       LOS: 1 day   Time spent: 51min  Dijon Cosens C Milana Salay, DO [facility]  If 7PM-7AM, please contact night-coverage [email or web address]  04/21/2020, 7:53 AM

## 2020-04-22 DIAGNOSIS — D61818 Other pancytopenia: Secondary | ICD-10-CM

## 2020-04-22 DIAGNOSIS — D649 Anemia, unspecified: Secondary | ICD-10-CM | POA: Diagnosis not present

## 2020-04-22 DIAGNOSIS — R531 Weakness: Secondary | ICD-10-CM

## 2020-04-22 DIAGNOSIS — E440 Moderate protein-calorie malnutrition: Secondary | ICD-10-CM | POA: Diagnosis not present

## 2020-04-22 LAB — PHOSPHORUS: Phosphorus: 3.5 mg/dL (ref 2.5–4.6)

## 2020-04-22 LAB — BASIC METABOLIC PANEL
Anion gap: 10 (ref 5–15)
BUN: <5 mg/dL — ABNORMAL LOW (ref 8–23)
CO2: 28 mmol/L (ref 22–32)
Calcium: 8 mg/dL — ABNORMAL LOW (ref 8.9–10.3)
Chloride: 102 mmol/L (ref 98–111)
Creatinine, Ser: 0.55 mg/dL (ref 0.44–1.00)
GFR, Estimated: >60 mL/min (ref >60)
Glucose, Bld: 100 mg/dL — ABNORMAL HIGH (ref 70–99)
Potassium: 2.6 mmol/L — CL (ref 3.5–5.1)
Sodium: 140 mmol/L (ref 135–145)

## 2020-04-22 LAB — CBC
HCT: 23.9 % — ABNORMAL LOW (ref 36.0–46.0)
Hemoglobin: 8 g/dL — ABNORMAL LOW (ref 12.0–15.0)
MCH: 31.1 pg (ref 26.0–34.0)
MCHC: 33.5 g/dL (ref 30.0–36.0)
MCV: 93 fL (ref 80.0–100.0)
Platelets: 12 10*3/uL — CL (ref 150–400)
RBC: 2.57 MIL/uL — ABNORMAL LOW (ref 3.87–5.11)
RDW: 15.6 % — ABNORMAL HIGH (ref 11.5–15.5)
WBC: 1.5 10*3/uL — ABNORMAL LOW (ref 4.0–10.5)
nRBC: 1.3 % — ABNORMAL HIGH (ref 0.0–0.2)

## 2020-04-22 LAB — MAGNESIUM: Magnesium: 1.4 mg/dL — ABNORMAL LOW (ref 1.7–2.4)

## 2020-04-22 MED ORDER — MAGNESIUM SULFATE 4 GM/100ML IV SOLN
4.0000 g | Freq: Once | INTRAVENOUS | Status: AC
  Administered 2020-04-22: 4 g via INTRAVENOUS
  Filled 2020-04-22: qty 100

## 2020-04-22 MED ORDER — POTASSIUM CHLORIDE 10 MEQ/100ML IV SOLN
10.0000 meq | INTRAVENOUS | Status: AC
  Administered 2020-04-22 – 2020-04-23 (×10): 10 meq via INTRAVENOUS
  Filled 2020-04-22 (×10): qty 100

## 2020-04-22 NOTE — NC FL2 (Signed)
[place] LEVEL OF CARE SCREENING TOOL     IDENTIFICATION  Patient Name: Kelly Roberson Birthdate: May 04, 1960 Sex: female Admission Date (Current Location): 04/20/2020  [place] and [place] Number:  Kathleen Argue [id number] Facility and Address:  [facility],  [place], [place]      Provider Number: [id number]  Attending Physician Name and Address:  Kerney Elbe, DO  Relative Name and Phone Number:  Tonette Bihari [phone]  [phone]  Netty Starring [phone]  [phone]    Current Level of Care: Hospital Recommended Level of Care: [place] Prior Approval Number:    Date Approved/Denied:   PASRR Number: [id number]  Discharge Plan: SNF    Current Diagnoses: Patient Active Problem List   Diagnosis Date Noted  . Malnutrition of moderate degree 04/21/2020  . Symptomatic anemia 04/20/2020  . Intractable headache 02/14/2020  . RLS (restless legs syndrome) 01/09/2020  . Neutropenic fever ([place])   . Thrombocytopenia ([place])   . Sepsis ([place]) 09/10/2019  . Severe sepsis ([place]) 09/09/2019  . Typhlitis   . Colitis 07/29/2019  . Hypokalemia 07/29/2019  . Pancytopenia ([place]) 07/29/2019  . Antineoplastic chemotherapy induced pancytopenia ([place]) 07/01/2019  . Pancytopenia due to antineoplastic chemotherapy ([place]) 07/01/2019  . Headache 07/01/2019  . Neutropenia ([place]) 07/01/2019  . Macrocytic anemia 05/01/2019  . AML (acute myeloblastic leukemia) ([place]) 05/01/2019  . Neck pain on right side 05/01/2019  . Leukocytosis 05/01/2019  . Bicytopenia 04/30/2019  . Macrocytosis without anemia 02/12/2019  . Family history of hemochromatosis 02/12/2019  . Mixed hyperlipidemia 01/18/2019  . Abnormal LFTs 01/18/2019  . Dandruff in adult 01/18/2019  . Hair loss 01/18/2019  . Muscle weakness 01/18/2019  . Peripheral neuropathy 01/18/2019  . Other fatigue 01/18/2019  . Essential hypertension 06/01/2018  .  Palpitations 06/01/2018  . Anxiety disorder 06/01/2018  . Insomnia 06/01/2018  . Irritable bowel syndrome with diarrhea 06/01/2018  . Barrett's esophagus with dysplasia 06/01/2018    Orientation RESPIRATION BLADDER Height & Weight     Self,Time,Situation,Place  Normal Continent Weight: 185 lb 3 oz (84 kg) Height:  5' 7.99" (172.7 cm)  BEHAVIORAL SYMPTOMS/MOOD NEUROLOGICAL BOWEL NUTRITION STATUS      Continent Diet  AMBULATORY STATUS COMMUNICATION OF NEEDS Skin   Limited Assist Verbally Surgical wounds                       Personal Care Assistance Level of Assistance              Functional Limitations Info  Sight,Hearing,Speech Sight Info: Adequate Hearing Info: Adequate Speech Info: Adequate    SPECIAL CARE FACTORS FREQUENCY  PT (By licensed PT),OT (By licensed OT)     PT Frequency: Minimum 5x a week OT Frequency: Minimum 5x a week            Contractures Contractures Info: Not present    Additional Factors Info  Psychotropic,Code Status,Allergies Code Status Info: Full Code Allergies Info: Ace Inhibitors   Lisinopril   Tylenol Psychotropic Info: escitalopram (LEXAPRO) tablet 5 mg         Current Medications (04/22/2020):  This is the current hospital active medication list Current Facility-Administered Medications  Medication Dose Route Frequency Provider Last Rate Last Admin  . acyclovir (ZOVIRAX) tablet 400 mg  400 mg Oral BID Little Ishikawa, MD   400 mg at 04/22/20 0854  . ALPRAZolam Duanne Moron) tablet 0.5 mg  0.5 mg Oral QHS PRN Little Ishikawa, MD      .  cholecalciferol (VITAMIN D) tablet 2,000 Units  2,000 Units Oral Daily Little Ishikawa, MD   2,000 Units at 04/22/20 [phone]  . escitalopram (LEXAPRO) tablet 5 mg  5 mg Oral Daily Little Ishikawa, MD   5 mg at 04/22/20 0854  . feeding supplement (ENSURE ENLIVE / ENSURE PLUS) liquid 237 mL  237 mL Oral TID BM Little Ishikawa, MD   237 mL at 04/22/20 1348  . multivitamin with  minerals tablet 1 tablet  1 tablet Oral Daily Little Ishikawa, MD   1 tablet at 04/22/20 [phone]  . ondansetron (ZOFRAN) injection 4 mg  4 mg Intravenous Q6H PRN Opyd, Ilene Qua, MD   4 mg at 04/21/20 1048  . oxyCODONE (Oxy IR/ROXICODONE) immediate release tablet 5 mg  5 mg Oral Q4H PRN Opyd, Ilene Qua, MD   5 mg at 04/20/20 2252  . oxyCODONE (OXYCONTIN) 12 hr tablet 20 mg  20 mg Oral Q12H Little Ishikawa, MD   20 mg at 04/22/20 0854  . pantoprazole (PROTONIX) EC tablet 40 mg  40 mg Oral Daily Little Ishikawa, MD   40 mg at 04/22/20 0854  . posaconazole (NOXAFIL) delayed-release tablet 300 mg  300 mg Oral Daily Little Ishikawa, MD   300 mg at 04/22/20 0854  . potassium chloride 10 mEq in 100 mL IVPB  10 mEq Intravenous Q2H Raiford Noble Akeley, DO 100 mL/hr at 04/22/20 1720 10 mEq at 04/22/20 1720  . pregabalin (LYRICA) capsule 150 mg  150 mg Oral BID Little Ishikawa, MD   150 mg at 04/22/20 0854  . traZODone (DESYREL) tablet 50 mg  50 mg Oral QHS Little Ishikawa, MD   50 mg at 04/21/20 2138     Discharge Medications: Please see discharge summary for a list of discharge medications.  Relevant Imaging Results:  Relevant Lab Results:   Additional Information SSN [id number]  Ross Ludwig, LCSW

## 2020-04-22 NOTE — Progress Notes (Signed)
Physical Therapy Treatment Patient Details Name: Anoushka Divito MRN: [id number] DOB: 1960-11-21 Today's Date: 04/22/2020    History of Present Illness Kelly Roberson is a 60 y.o. female with medical history significant of Acute Myeloid leukemia having been admitted to [facility] for the last 60 days. Was discharged home for 24h before she presented to oncology for further evaluation for ongoing weakness. She declined admission to hospital at that point but presented again to oncology for ongoing weakness. Reports fall at home. Comorbid conditions include - HTN, Anxiety, Hepatitis B/C, IBS, RLS.    PT Comments    Pt in bed so assisted with amb to bathroom first.  General bed mobility comments: required increased time and effort due to weakness.  General transfer comment: required increased time and effort due to general weakness and instability. General Gait Details: assisted with amb to and from bathroom only due to weakness and uncontrolled loose yellowish stools. RN aware.  Pt is deconditioned and is a HIGH FALL RISK.  Consulted evaluating Therapist Tresa Endo that pt would benefit from a more agressive Rehab such as CIR.    Follow Up Recommendations  CIR     Equipment Recommendations  None recommended by PT    Recommendations for Other Services       Precautions / Restrictions Precautions Precautions: Fall Precaution Comments: hx of falls    Mobility  Bed Mobility Overal bed mobility: Needs Assistance Bed Mobility: Supine to Sit;Sit to Supine     Supine to sit: Min assist Sit to supine: Min assist   General bed mobility comments: required increased time and effort due to weakness    Transfers Overall transfer level: Needs assistance Equipment used: Rolling walker (2 wheeled) Transfers: Sit to/from [facility] Sit to Stand: Min guard Stand pivot transfers: Min guard;Min assist       General transfer comment: required increased time and effort due to  general weakness and instability.  Ambulation/Gait Ambulation/Gait assistance: Min assist Gait Distance (Feet): 22 Feet (to and from bathroom) Assistive device: Rolling walker (2 wheeled) Gait Pattern/deviations: Step-through pattern;Decreased stride length Gait velocity: decreased   General Gait Details: assisted with amb to and from bathroom only due to weakness and uncontrolled loose yellowish stools.   Stairs             Wheelchair Mobility    Modified Rankin (Stroke Patients Only)       Balance                                            Cognition Arousal/Alertness: Awake/alert Behavior During Therapy: Anxious;WFL for tasks assessed/performed                                   General Comments: AxO x 3 pleasant      Exercises      General Comments        Pertinent Vitals/Pain Pain Assessment: No/denies pain    Home Living                      Prior Function            PT Goals (current goals can now be found in the care plan section) Progress towards PT goals: Progressing toward goals    Frequency  PT Plan Current plan remains appropriate    Co-evaluation              AM-PAC PT "6 Clicks" Mobility   Outcome Measure  Help needed turning from your back to your side while in a flat bed without using bedrails?: A Little Help needed moving from lying on your back to sitting on the side of a flat bed without using bedrails?: A Little Help needed moving to and from a bed to a chair (including a wheelchair)?: A Little Help needed standing up from a chair using your arms (e.g., wheelchair or bedside chair)?: A Little Help needed to walk in hospital room?: A Little Help needed climbing 3-5 steps with a railing? : A Little 6 Click Score: 18    End of Session Equipment Utilized During Treatment: Gait belt Activity Tolerance: Patient limited by pain Patient left: in bed;with call  bell/phone within reach;with bed alarm set Nurse Communication: Mobility status PT Visit Diagnosis: Muscle weakness (generalized) (M62.81);Difficulty in walking, not elsewhere classified (R26.2);Pain;History of falling (Z91.81)     Time: 6073-7106 PT Time Calculation (min) (ACUTE ONLY): 26 min  Charges:  $Gait Training: 8-22 mins $Therapeutic Activity: 8-22 mins                     Rica Koyanagi  PTA Acute  Rehabilitation Services Pager      [phone] Office      [phone]

## 2020-04-22 NOTE — Progress Notes (Signed)
PROGRESS NOTE    Kelly Roberson  TTS:[id number] DOB: Jan 03, 1961 DOA: 04/20/2020 PCP: Kristie Cowman, MD   Brief Narrative:  HPI per Dr. Holli Humbles on 04/20/20 Kelly Roberson a 60 y.o.femalewith medical history significant ofAcute Myeloid leukemia having been admitted to [facility] for the last 60 days. Was discharged home for 24h before she presented to oncology for further evaluation for ongoing weakness. She declined admission to hospital at that point but now presents again to oncology for ongoing weakness who is requesting admission to the hospital. Comorbid conditions include - HTN, Anxiety, Hepatitis B/C, IBS, RLS.   Timeline of events per oncology follow up: 1) 04/30/2019: patient presented to [facility] ED with progressive weakness. Found to have pancytopenia and peripheral blasts with intracellular inclusions.  2) 05/01/2019: transferred to [facility] 3) 05/04/2019: patient started induction therapy for AML with 7+3 and gemtuzumab on Day 4. 4) 05/16/2019: repeat Bmbx shows no residual leukemia 5) 06/17/2019: patient returned for Cycle 1 Consolidation with cytarabine and gemtuzumab 6) 06/24/2019: presents to reconnect with [facility] for co-managed care. 7) 07/15/2019: Cycle 2 of consolidation therapy  8) 07/30/2019: admitted for BRBPR/ Infectious colitis.  9) 08/26/2019: start of Cycle 3 of consolidation therapy at [facility]  10) 09/09/2019: admitted withneutropenicfever and BRBPR 11) 10/23/2019: evaluated by Dr. Elmo Putt. After discussion with him decided against oral azacitidine therapy 12) 02/17/2020-04/15/2020: admitted to [facility]. Treated with Decitabine/Venetoclax x 2 cycles 13) Presented to [facility] ED 04/16/20 for weakness/fall/ambulatory dysfunction. Refused admission and returned home despite discussion with ED and Oncology physician. 14) Presents to oncology 04/20/20 in the office for ongoing weakness and increased falls- admitted directly to hospital for PT evaluation and  platelet transfusion to be evaluated for placement.  **Interim History  PT evaluating and recommending CIR but she had Critically low WBC and Platelets which were contraindications to aggressive Rehab. CIR admission denied and recommendation was for SNF.   Assessment & Plan:   Active Problems:   Symptomatic anemia   Malnutrition of moderate degree  Ambulatory dysfunction, mechanical fall - Patient was taken to the emergency room on 04/16/2020 for a fall from standing with trauma to the head - negative workup at that time. - This occurred 1 day after discharge from [facility]. Her admission there lasted approximately 2 months. - Patient declined to be admitted to the hospital at that time- agreeable to admission. -PT to evaluate for possible needs/placement and they are recommending SNF  Acute on chronic thrombocytopenia; chronic pancytopenia Secondary to below - Secondary to below - Transfuse 1 pool platelets at admission -Goal hemoglobin 8, goal platelet 10 -Her WBC is 1.5, hemoglobin/marker is 8.0/22.9 and platelet count is 12 -Continue to monitor and trend and repeat CBC in a.m.  Hypokalemia  -Unclear etiology, likely poor p.o. intake malnutrition, continue to advance diet as tolerated -K+ this AM was 2.6 and Mag level was 1.4 -Questionable episodes of nausea and vomiting per the patient prior to admission -Replete via IV given patient's refusal to take po Pills and Liquid formulations -Currently replating with IV KCl 10 mill colons every 2 hours for 10 doses -Mag level was 1.4 so this will be repleted with IV mag sulfate 4 g To monitor and replete as necessary For repeat CMP  Hypomagnesemia -Patient magnesium level was 1.4 -Replete with IV mag sulfate 4 g -Continue to monitor replete as necessary -Repeat magnesium level in a.m.  Myeloid Leukemia, Favorable Risk (RUNX1/RUNX1T1) -Continue f/u at [facility] with Dr. Elmo Putt. - Follows  locally with Dr Lorenso Courier -twice weekly blood draws with q 2 week clinic visits. -If necessary will consult oncology here  ?Tinea Corporis -Already on Antifungals with posaconazole but have reached out to pharmacy to see if she needs a topical formulation for ringworm -May need Terbinafine 250 mg q24h for 2-4 weeks  -May need to Discuss with ID   DVT prophylaxis: SCDs given thrombocytopenia Code Status: FULL CODE  Family Communication: No family present at bedside  Disposition Plan: Anticipating D/C to SNF  Status is: Inpatient  Remains inpatient appropriate because:Unsafe d/c plan, IV treatments appropriate due to intensity of illness or inability to take PO and Inpatient level of care appropriate due to severity of illness   Dispo:  Patient From: Home  Planned Disposition: [place]  Medically stable for discharge: Yes     Consultants:   Medical Oncology  Procedures: None  Antimicrobials:  Anti-infectives (From admission, onward)   Start     Dose/Rate Route Frequency Ordered Stop   04/21/20 1000  acyclovir (ZOVIRAX) tablet 400 mg        400 mg Oral 2 times daily 04/21/20 0750     04/21/20 1000  posaconazole (NOXAFIL) delayed-release tablet 300 mg        300 mg Oral Daily 04/21/20 0750          Subjective: Seen and examined at bedside and she is still feeling weak.  Does not want p.o. potassium.  Denies any chest pain or shortness of breath.  No other concerns or complaints at this time.  Objective: Vitals:   04/21/20 1836 04/21/20 2045 04/22/20 0510 04/22/20 1432  BP: (!) 120/58 122/61 125/61 (!) 106/57  Pulse: 72 77 77 70  Resp: 17 16 16 16   Temp: 98.4 F (36.9 C) 98.9 F (37.2 C) 98.4 F (36.9 C) 97.7 F (36.5 C)  TempSrc: Oral Oral Oral Oral  SpO2: 90% 91% 90% 92%  Weight:      Height:        Intake/Output Summary (Last 24 hours) at 04/22/2020 1839 Last data filed at 04/22/2020 1328 Gross per 24 hour  Intake 120 ml  Output --  Net  120 ml   Filed Weights   04/20/20 1500  Weight: 84 kg   Examination: Physical Exam:  Constitutional: Chronically ill-appearing Caucasian female who is older than her stated age Eyes: PERRL, lids and conjunctivae normal, sclerae anicteric  ENMT: External Ears, Nose appear normal. Grossly normal hearing.  Neck: Appears normal, supple, no cervical masses, normal ROM, no appreciable thyromegaly; no JVD Respiratory: Diminished to auscultation bilaterally, no wheezing, rales, rhonchi or crackles. Normal respiratory effort and patient is not tachypenic. No accessory muscle use.  Unlabored breathing Cardiovascular: RRR, no murmurs / rubs / gallops. S1 and S2 auscultated.  1+ extremity edema Abdomen: Soft, non-tender, non-distended. Bowel sounds positive.  GU: Deferred. Musculoskeletal: No clubbing / cyanosis of digits/nails. No joint deformity upper and lower extremities.  Neurologic: CN 2-12 grossly intact with no focal deficits. Romberg sign and cerebellar reflexes not assessed.  Psychiatric: Normal judgment and insight. Alert and oriented x 3. Normal mood and appropriate affect.   Data Reviewed: I have personally reviewed following labs and imaging studies  CBC: Recent Labs  Lab 04/16/20 1323 04/17/20 1230 04/20/20 1117 04/21/20 0606 04/22/20 0532  WBC 0.7* 0.6* 1.0* 1.0* 1.5*  NEUTROABS 0.2* 0.2* 0.1*  --   --   HGB 8.4* 8.5* 8.1* 7.2* 8.0*  HCT 25.6* 25.7* 23.3* 21.3* 23.9*  MCV  95.5 93.1 89.6 92.6 93.0  PLT 19* 17* 9* 15* 12*   Basic Metabolic Panel: Recent Labs  Lab 04/16/20 1323 04/17/20 1230 04/20/20 1117 04/21/20 0606 04/22/20 0532  NA 139 140 142 140 140  K 3.8 3.4* 2.9* 2.4* 2.6*  CL 104 102 101 104 102  CO2 28 27 26 29 28   GLUCOSE 101* 103* 104* 118* 100*  BUN 6* 4* <4* <5* <5*  CREATININE 0.68 0.66 0.63 0.53 0.55  CALCIUM 8.5* 8.2* 8.3* 7.8* 8.0*  MG  --   --   --   --  1.4*  PHOS  --   --   --   --  3.5   GFR: Estimated Creatinine Clearance: 83.8  mL/min (by C-G formula based on SCr of 0.55 mg/dL). Liver Function Tests: Recent Labs  Lab 04/16/20 1323 04/17/20 1230 04/20/20 1117 04/21/20 0606  AST 23 21 25 21   ALT 16 12 12 12   ALKPHOS 205* 239* 186* 137*  BILITOT 1.1 1.0 1.0 0.9  PROT 5.4* 5.7* 5.6* 4.9*  ALBUMIN 2.7* 2.9* 3.0* 2.7*   No results for input(s): LIPASE, AMYLASE in the last 168 hours. No results for input(s): AMMONIA in the last 168 hours. Coagulation Profile: Recent Labs  Lab 04/21/20 0606  INR 1.3*   Cardiac Enzymes: No results for input(s): CKTOTAL, CKMB, CKMBINDEX, TROPONINI in the last 168 hours. BNP (last 3 results) No results for input(s): PROBNP in the last 8760 hours. HbA1C: No results for input(s): HGBA1C in the last 72 hours. CBG: No results for input(s): GLUCAP in the last 168 hours. Lipid Profile: No results for input(s): CHOL, HDL, LDLCALC, TRIG, CHOLHDL, LDLDIRECT in the last 72 hours. Thyroid Function Tests: No results for input(s): TSH, T4TOTAL, FREET4, T3FREE, THYROIDAB in the last 72 hours. Anemia Panel: No results for input(s): VITAMINB12, FOLATE, FERRITIN, TIBC, IRON, RETICCTPCT in the last 72 hours. Sepsis Labs: No results for input(s): PROCALCITON, LATICACIDVEN in the last 168 hours.  Recent Results (from the past 240 hour(s))  SARS CORONAVIRUS 2 (TAT 6-24 HRS) Nasopharyngeal Nasopharyngeal Swab     Status: None   Collection Time: 04/21/20  1:37 AM   Specimen: Nasopharyngeal Swab  Result Value Ref Range Status   SARS Coronavirus 2 NEGATIVE NEGATIVE Final    Comment: (NOTE) SARS-CoV-2 target nucleic acids are NOT DETECTED.  The SARS-CoV-2 RNA is generally detectable in upper and lower respiratory specimens during the acute phase of infection. Negative results do not preclude SARS-CoV-2 infection, do not rule out co-infections with other pathogens, and should not be used as the sole basis for treatment or other patient management decisions. Negative results must be combined  with clinical observations, patient history, and epidemiological information. The expected result is Negative.  Fact Sheet for Patients: SugarRoll.be  Fact Sheet for Healthcare Providers: https://www.woods-mathews.com/  This test is not yet approved or cleared by the [place] FDA and  has been authorized for detection and/or diagnosis of SARS-CoV-2 by FDA under an Emergency Use Authorization (EUA). This EUA will remain  in effect (meaning this test can be used) for the duration of the COVID-19 declaration under Se ction 564(b)(1) of the Act, 21 U.S.C. section 360bbb-3(b)(1), unless the authorization is terminated or revoked sooner.  Performed at [facility], [place] [address], [place], [place] [address]     RN Pressure Injury Documentation:     Estimated body mass index is 28.16 kg/m as calculated from the following:   Height as of this encounter: 5'  7.99" (1.727 m).   Weight as of this encounter: 84 kg.  Malnutrition Type:  Nutrition Problem: Moderate Malnutrition Etiology: chronic illness,cancer and cancer related treatments  Malnutrition Characteristics:  Signs/Symptoms: mild fat depletion,mild muscle depletion  Nutrition Interventions:  Interventions: Ensure Enlive (each supplement provides 350kcal and 20 grams of protein),MVI   Radiology Studies: No results found.  Scheduled Meds: . acyclovir  400 mg Oral BID  . cholecalciferol  2,000 Units Oral Daily  . escitalopram  5 mg Oral Daily  . feeding supplement  237 mL Oral TID BM  . multivitamin with minerals  1 tablet Oral Daily  . oxyCODONE  20 mg Oral Q12H  . pantoprazole  40 mg Oral Daily  . posaconazole  300 mg Oral Daily  . pregabalin  150 mg Oral BID  . traZODone  50 mg Oral QHS   Continuous Infusions: . potassium chloride 10 mEq (04/22/20 1720)    LOS: 2 days   Kerney Elbe, DO [facility] PAGER is on [place]  If 7PM-7AM, please  contact night-coverage [email or web address]

## 2020-04-22 NOTE — Progress Notes (Addendum)
Slight improvement on KCL level. As of 04/22/2020, reported from lab 2.6

## 2020-04-22 NOTE — Progress Notes (Signed)
Inpatient Rehab Admissions Coordinator:   Consult received.  Chart reviewed and case discussed with rehab MD, Dr. Posey Pronto, and Silvestre Mesi, PA-C.  Both state that patient's critically low WBCs (<2) and Platelets (<15) are a contraindication to aggressive rehab.  If she were admitted to CIR, she would not be able to participate in the program due to her pancytopenia.  We will not pursue admission at this time, an will sign off.  Recommend f/u with a slower paced therapy setting, such as SNF or HH, per therapy recommendations.  Shann Medal, PT, DPT Admissions Coordinator [phone] 04/22/20  3:02 PM

## 2020-04-22 NOTE — TOC Initial Note (Signed)
Transition of Care [facility]) - Initial/Assessment Note    Patient Details  Name: Kelly Roberson MRN: [id number] Date of Birth: May 31, 1960  [facility] of [facility] CM/SW Contact:    Ross Ludwig, LCSW Phone Number: 04/22/2020, 5:22 PM  Clinical Narrative:                  Patient is a 60 year old female who normally lives at home by herself.  Patient was just discharged from [facility] last week, after being there for 60 days per medical record.  Patient's sons live out of town, one lives in [place], and the other lives in [place].  Patient's was set up with [facility] services [facility], and also 18 hours of PCP services through [facility].  HH PT has only been able to see patient once since discharging from [place] due to her being readmitted to the hospital ED last week.  Patient then was discharged back home AMA.  Patient followed up with her oncologist last week, and they were concerned about patient, and tried admitting her, but she said she would rather go home first, then return on Monday since she was just in the hospital for an extended period of time.    Patient agreed to come to the hospital this past Monday, and admitted to the hospital.  PT tried working with patient yesterday, however she was unable to participate due to being in too much pain from her potassium IV, therefore PT recommended SNF.  CSW then contacted Kendrick Fries Power, [facility] Medicaid CM, [phone] to find out what in home care services and home health patient is approved for.  Per Kendrick Fries, patient is eligible for [facility] PT, and 18 hours of PCP services a week, but it can be increased once she returns back home and is reassessed.  CSW spoke to patient's son Darnelle Maffucci, and discussed SNF verse short term rehab.  CSW informed him that because of her insurance it will limit what SNF can accept patient.  Patient's son expressed understanding, and asked CSW to check in [place] or [place] area since each of her sons live there.   CSW told patient's son, CSW will check with Medicaid case manager to see if that is a possibility.  CSW informed him, it probably will not be possible because of her insurance but will follow up on it tomorrow.  CSW to begin bed search for contracted SNFs.    PT worked with patient today and recommended CIR, however CIR denied patient, due to not being to tolerate intensity of therapy.  CIR recommended SNF workup or home with home health.  CSW to continue to follow patient's progress throughout discharge planning.     Expected Discharge Plan: Skilled Nursing Facility Barriers to Discharge: Continued Medical Work up   Patient Goals and CMS Choice Patient states their goals for this hospitalization and ongoing recovery are:: To go to SNF for short term rehab, or return back home with home health services. CMS Medicare.gov Compare Post Acute Care list provided to:: Patient Represenative (must comment) Choice offered to / list presented to : Adult Children  Expected Discharge Plan and Services Expected Discharge Plan: [place] In-house Referral: Clinical Social Work   Post Acute Care Choice: [place] Living arrangements for the past 2 months: Single Family Home                           HH Arranged: PT,Nurse's Aide [facility]  Agency: [facility] Date [place]: 04/22/20 Time [place]: 41 Representative spoke with at [place]: Lattie Haw  Prior Living Arrangements/Services Living arrangements for the past 2 months: [place] with:: Self Patient language and need for interpreter reviewed:: Yes Do you feel safe going back to the place where you live?: No   Patient may need short term rehab at SNF verse going home with home health.  Need for Family Participation in Patient Care: Yes (Comment) Care giver support system in place?: Yes (comment) Current home services: Home PT,Homehealth aide Criminal Activity/Legal  Involvement Pertinent to Current Situation/Hospitalization: No - Comment as needed  Activities of Daily Living Home Assistive Devices/Equipment: Cane (specify quad or straight),Shower chair with back,Walker (specify type),Eyeglasses,Other (Comment) (single point cane, 4 wheeled walker, toilet riser) ADL Screening (condition at time of admission) Patient's cognitive ability adequate to safely complete daily activities?: Yes Is the patient deaf or have difficulty hearing?: Yes (thinks she has some hearing issues) Does the patient have difficulty seeing, even when wearing glasses/contacts?: No Does the patient have difficulty concentrating, remembering, or making decisions?: No Patient able to express need for assistance with ADLs?: Yes Does the patient have difficulty dressing or bathing?: No Independently performs ADLs?: Yes (appropriate for developmental age) Does the patient have difficulty walking or climbing stairs?: Yes (secondary to weakness) Weakness of Legs: Both Weakness of Arms/Hands: None  Permission Sought/Granted Permission sought to share information with : Case Manager,Facility Contact Representative,Family Supports Permission granted to share information with : Yes, Verbal Permission Granted,Yes, Release of Information Signed  Share Information with NAME: Tonette Bihari [phone]  [phone]  Netty Starring [phone]  [phone]  Permission granted to share info w AGENCY: SNF admissions and home health agencies.        Emotional Assessment Appearance:: Appears stated age   Affect (typically observed): Calm,Accepting,Stable Orientation: : Oriented to Self,Oriented to Place,Oriented to  Time,Oriented to Situation   Psych Involvement: No (comment)  Admission diagnosis:  Symptomatic anemia [D64.9] Patient Active Problem List   Diagnosis Date Noted  . Malnutrition of moderate degree 04/21/2020  . Symptomatic anemia 04/20/2020  . Intractable headache  02/14/2020  . RLS (restless legs syndrome) 01/09/2020  . Neutropenic fever ([place])   . Thrombocytopenia ([place])   . Sepsis ([place]) 09/10/2019  . Severe sepsis ([place]) 09/09/2019  . Typhlitis   . Colitis 07/29/2019  . Hypokalemia 07/29/2019  . Pancytopenia ([place]) 07/29/2019  . Antineoplastic chemotherapy induced pancytopenia ([place]) 07/01/2019  . Pancytopenia due to antineoplastic chemotherapy ([place]) 07/01/2019  . Headache 07/01/2019  . Neutropenia ([place]) 07/01/2019  . Macrocytic anemia 05/01/2019  . AML (acute myeloblastic leukemia) ([place]) 05/01/2019  . Neck pain on right side 05/01/2019  . Leukocytosis 05/01/2019  . Bicytopenia 04/30/2019  . Macrocytosis without anemia 02/12/2019  . Family history of hemochromatosis 02/12/2019  . Mixed hyperlipidemia 01/18/2019  . Abnormal LFTs 01/18/2019  . Dandruff in adult 01/18/2019  . Hair loss 01/18/2019  . Muscle weakness 01/18/2019  . Peripheral neuropathy 01/18/2019  . Other fatigue 01/18/2019  . Essential hypertension 06/01/2018  . Palpitations 06/01/2018  . Anxiety disorder 06/01/2018  . Insomnia 06/01/2018  . Irritable bowel syndrome with diarrhea 06/01/2018  . Barrett's esophagus with dysplasia 06/01/2018   PCP:  Kristie Cowman, MD Pharmacy:   [place], [place] - [address] [place] [address] Phone: [phone] Fax: [phone]     Social Determinants of Health (SDOH) Interventions  Readmission Risk Interventions No flowsheet data found.

## 2020-04-22 NOTE — Progress Notes (Signed)
Rehab Admissions Coordinator Note:  Patient was screened by Cleatrice Burke for appropriateness for an Inpatient Acute Rehab Consult per therapy change in recommendation.  At this time, we are recommending Inpatient Rehab consult. I will place order per protocol.  Cleatrice Burke RN MSN 04/22/2020, 1:37 PM  I can be reached at [phone].

## 2020-04-23 ENCOUNTER — Inpatient Hospital Stay: Payer: Medicaid Other

## 2020-04-23 ENCOUNTER — Inpatient Hospital Stay (HOSPITAL_COMMUNITY): Payer: Medicaid Other

## 2020-04-23 DIAGNOSIS — R21 Rash and other nonspecific skin eruption: Secondary | ICD-10-CM

## 2020-04-23 DIAGNOSIS — D696 Thrombocytopenia, unspecified: Secondary | ICD-10-CM

## 2020-04-23 DIAGNOSIS — D701 Agranulocytosis secondary to cancer chemotherapy: Secondary | ICD-10-CM | POA: Diagnosis not present

## 2020-04-23 DIAGNOSIS — T451X5A Adverse effect of antineoplastic and immunosuppressive drugs, initial encounter: Secondary | ICD-10-CM

## 2020-04-23 DIAGNOSIS — R531 Weakness: Secondary | ICD-10-CM | POA: Diagnosis not present

## 2020-04-23 DIAGNOSIS — E440 Moderate protein-calorie malnutrition: Secondary | ICD-10-CM | POA: Diagnosis not present

## 2020-04-23 DIAGNOSIS — C9201 Acute myeloblastic leukemia, in remission: Secondary | ICD-10-CM

## 2020-04-23 DIAGNOSIS — C9202 Acute myeloblastic leukemia, in relapse: Secondary | ICD-10-CM | POA: Diagnosis not present

## 2020-04-23 DIAGNOSIS — D649 Anemia, unspecified: Secondary | ICD-10-CM | POA: Diagnosis not present

## 2020-04-23 DIAGNOSIS — D61818 Other pancytopenia: Secondary | ICD-10-CM | POA: Diagnosis not present

## 2020-04-23 LAB — COMPREHENSIVE METABOLIC PANEL
ALT: 11 U/L (ref 0–44)
AST: 33 U/L (ref 15–41)
Albumin: 3 g/dL — ABNORMAL LOW (ref 3.5–5.0)
Alkaline Phosphatase: 142 U/L — ABNORMAL HIGH (ref 38–126)
Anion gap: 12 (ref 5–15)
BUN: <5 mg/dL — ABNORMAL LOW (ref 8–23)
CO2: 26 mmol/L (ref 22–32)
Calcium: 8.7 mg/dL — ABNORMAL LOW (ref 8.9–10.3)
Chloride: 105 mmol/L (ref 98–111)
Creatinine, Ser: 0.73 mg/dL (ref 0.44–1.00)
GFR, Estimated: >60 mL/min (ref >60)
Glucose, Bld: 100 mg/dL — ABNORMAL HIGH (ref 70–99)
Potassium: 3.9 mmol/L (ref 3.5–5.1)
Sodium: 143 mmol/L (ref 135–145)
Total Bilirubin: 1 mg/dL (ref 0.3–1.2)
Total Protein: 5.3 g/dL — ABNORMAL LOW (ref 6.5–8.1)

## 2020-04-23 LAB — CBC WITH DIFFERENTIAL/PLATELET
Abs Immature Granulocytes: 0.1 10*3/uL — ABNORMAL HIGH (ref 0.00–0.07)
Basophils Absolute: 0 10*3/uL (ref 0.0–0.1)
Basophils Relative: 0 %
Blasts: 35 %
Eosinophils Absolute: 0 10*3/uL (ref 0.0–0.5)
Eosinophils Relative: 0 %
HCT: 26.3 % — ABNORMAL LOW (ref 36.0–46.0)
Hemoglobin: 8.3 g/dL — ABNORMAL LOW (ref 12.0–15.0)
Lymphocytes Relative: 55 %
Lymphs Abs: 1.4 10*3/uL (ref 0.7–4.0)
MCH: 30.5 pg (ref 26.0–34.0)
MCHC: 31.6 g/dL (ref 30.0–36.0)
MCV: 96.7 fL (ref 80.0–100.0)
Monocytes Absolute: 0.1 10*3/uL (ref 0.1–1.0)
Monocytes Relative: 2 %
Myelocytes: 2 %
Neutro Abs: 0.2 10*3/uL — CL (ref 1.7–7.7)
Neutrophils Relative %: 6 %
Platelets: 13 10*3/uL — CL (ref 150–400)
RBC: 2.72 MIL/uL — ABNORMAL LOW (ref 3.87–5.11)
RDW: 16.1 % — ABNORMAL HIGH (ref 11.5–15.5)
WBC: 2.5 10*3/uL — ABNORMAL LOW (ref 4.0–10.5)
nRBC: 1.2 % — ABNORMAL HIGH (ref 0.0–0.2)

## 2020-04-23 LAB — MAGNESIUM: Magnesium: 2 mg/dL (ref 1.7–2.4)

## 2020-04-23 LAB — PATHOLOGIST SMEAR REVIEW

## 2020-04-23 LAB — PHOSPHORUS: Phosphorus: 2.6 mg/dL (ref 2.5–4.6)

## 2020-04-23 MED ORDER — OXYCODONE HCL 5 MG PO TABS
5.0000 mg | ORAL_TABLET | ORAL | Status: AC | PRN
  Administered 2020-04-23 – 2020-04-24 (×2): 5 mg via ORAL
  Filled 2020-04-23 (×2): qty 1

## 2020-04-23 MED ORDER — SODIUM CHLORIDE 0.9% IV SOLUTION: Freq: Once | INTRAVENOUS | Status: AC

## 2020-04-23 MED ORDER — LEVOFLOXACIN 500 MG PO TABS
500.0000 mg | ORAL_TABLET | Freq: Every day | ORAL | Status: DC
  Administered 2020-04-23 – 2020-04-29 (×7): 500 mg via ORAL
  Filled 2020-04-23 (×9): qty 1

## 2020-04-23 NOTE — Plan of Care (Signed)

## 2020-04-23 NOTE — Consult Note (Signed)
WOC Nurse Consult Note: Reason for Consult: LE wounds and remove stitches  Wound type: unclear etiology; has been treated recently at [facility]. She reports punch bx 2 weeks ago which is the site where she currently has 2 sutures.  ID MD in to see patient as well, per note: includes inflammatory dermatitis with secondary hemorrhage due to thrombocytopenia versus infectious (i.e. fungal, bacterial, viral) in the setting of prolonged neutropenia versus Sweet syndrome or leukemia cutis.   Pressure Injury POA: NA Measurement: various size lesions over the pretibial region bilaterally and the dorsal feet  Wound bed: dark red/purple, not open, circular lesions  Drainage (amount, consistency, odor) none Periwound: noted LE edema, patient endorses worse at times Dressing procedure/placement/frequency: No topical care recommended at this time Student RN to remove 2 sutures on the thigh; orders updated ACE wraps from toes to knee daily for light compression to assist with lessening the LE edema.   Discussed POC with patient and bedside nurse.  Re consult if needed, will not follow at this time. Thanks  Moritz Lever [employer], RN,CWOCN, CNS, [place] [phone])

## 2020-04-23 NOTE — Progress Notes (Signed)
PROGRESS NOTE    Kelly Roberson  VZC:[id number] DOB: 07/31/1960 DOA: 04/20/2020 PCP: Kristie Cowman, MD   Brief Narrative:  HPI per Dr. Holli Humbles on 04/20/20 Kelly Roberson a 60 y.o.femalewith medical history significant ofAcute Myeloid leukemia having been admitted to [facility] for the last 60 days. Was discharged home for 24h before she presented to oncology for further evaluation for ongoing weakness. She declined admission to hospital at that point but now presents again to oncology for ongoing weakness who is requesting admission to the hospital. Comorbid conditions include - HTN, Anxiety, Hepatitis B/C, IBS, RLS.   Timeline of events per oncology follow up: 1) 04/30/2019: patient presented to [facility] ED with progressive weakness. Found to have pancytopenia and peripheral blasts with intracellular inclusions.  2) 05/01/2019: transferred to [facility] 3) 05/04/2019: patient started induction therapy for AML with 7+3 and gemtuzumab on Day 4. 4) 05/16/2019: repeat Bmbx shows no residual leukemia 5) 06/17/2019: patient returned for Cycle 1 Consolidation with cytarabine and gemtuzumab 6) 06/24/2019: presents to reconnect with [facility] for co-managed care. 7) 07/15/2019: Cycle 2 of consolidation therapy  8) 07/30/2019: admitted for BRBPR/ Infectious colitis.  9) 08/26/2019: start of Cycle 3 of consolidation therapy at [facility]  10) 09/09/2019: admitted withneutropenicfever and BRBPR 11) 10/23/2019: evaluated by Dr. Elmo Putt. After discussion with him decided against oral azacitidine therapy 12) 02/17/2020-04/15/2020: admitted to [facility]. Treated with Decitabine/Venetoclax x 2 cycles 13) Presented to [facility] ED 04/16/20 for weakness/fall/ambulatory dysfunction. Refused admission and returned home despite discussion with ED and Oncology physician. 14) Presents to oncology 04/20/20 in the office for ongoing weakness and increased falls- admitted directly to hospital for PT evaluation and  platelet transfusion to be evaluated for placement. 15) 04/23/20 states she has Vaginal Bleeding now. Will transfuse 1 pack of platelets and asked ID to evaluate lesions on her legs as well. Transvaginal U/S ordered   **Interim History  PT evaluating and recommending CIR but she had Critically low WBC and Platelets which were contraindications to aggressive Rehab. CIR admission denied and recommendation was for SNF.   Assessment & Plan:   Principal Problem:   AML (acute myeloblastic leukemia) ([place]) Active Problems:   Neutropenia (HCC)   Thrombocytopenia (HCC)   Symptomatic anemia   Malnutrition of moderate degree   Rash and nonspecific skin eruption  Ambulatory dysfunction, mechanical fall - Patient was taken to the emergency room on 04/16/2020 for a fall from standing with trauma to the head - negative workup at that time. - This occurred 1 day after discharge from [facility]. Her admission there lasted approximately 2 months. - Patient declined to be admitted to the hospital at that time- agreeable to admission. -PT to evaluate for possible needs/placement and they are recommending SNF and will need assistance with placement   Acute on chronic thrombocytopenia; chronic pancytopenia Secondary to below -Secondary to below -Transfuse 1 pool platelets at admission and will get another now given Vaginal Bleeding  -Goal hemoglobin 8, goal platelet 20 now that she has evidence of bleeding  -Her WBC is 2.5, Hgb/Hct is 8.3/26.3 and platelet count is 13; ANC is 0.2 -Oncology recommending no GCSF  -Continue to monitor and trend and repeat CBC in a.m.   Hypokalemia, improved   -Unclear etiology, likely poor p.o. intake malnutrition, continue to advance diet as tolerated -K+ this AM was 3.9 and Mag level was 2.0 -Questionable episodes of nausea and vomiting per the patient prior to admission -Replete via IV given patient's refusal to  take po Pills and Liquid  formulations -Currently replating with IV KCl 10 mill colons every 2 hours for 10 doses -Mag level improved  -Continue To monitor and replete as necessary -Repeat CMP in the AM   Hypomagnesemia, improved  -Patient magnesium level was 1.4 and improved to 2.0 -Replete with IV mag sulfate 4 g yesterday  -Continue to monitor replete as necessary -Repeat magnesium level in a.m.  Myeloid Leukemia, Favorable Risk (RUNX1/RUNX1T1) -Continue f/u at [facility] with Dr. Elmo Putt. -Follows locally with Dr Lorenso Courier -twice weekly blood draws with q 2 week clinic visits. -Appreciate Dr. Libby Maw evaluation and Recc's; He feels that she may not have fully been treated as she has blasts floating around in her blood   Vaginal Bleeding -Will order Transvaginal U/S -Also will transfuse 1 pack of Platelets to try to get Platelet Goal >20 -Dr. Rip Harbour to follow up on Transvaginal U/S and make additional Recc's  ?Tinea Corporis vs other Rash -Unclear Rash on the LE that has proximal lesions thatare circular appearing erythematous plaques -Already on Antifungals with posaconazole but have reached out to pharmacy to see if she needs a topical formulation for ringworm -May need Terbinafine 250 mg q24h for 2-4 weeks  -Asked ID to weigh in and appreciate their assistance;  -Had lesions Biopsied at [facility] and was notable for spongiosis with hemorrhage but has now had progression of these new lesions on the LE with continued Neutropenia -ID feels her DDx includes Inflammatory Dermatitis with 2/2 Hemorrhage vs Infectious Etiology (Fungal, Bacterial, Viral) in the setting of Neutropenia vs. Sweet Syndrome vs. Leukemia Cutis -ID discussing with Dermatology -WOC nurse consulted to remove stitch from prior Bx site  DVT prophylaxis: SCDs given thrombocytopenia Code Status: FULL CODE  Family Communication: No family present at bedside  Disposition Plan: Anticipating D/C to SNF when medically stable and  cleared by ID and Oncology  Status is: Inpatient  Remains inpatient appropriate because:Unsafe d/c plan, IV treatments appropriate due to intensity of illness or inability to take PO and Inpatient level of care appropriate due to severity of illness   Dispo:  Patient From: Home  Planned Disposition: [place]  Medically stable for discharge: Yes     Consultants:   Medical Oncology  Infectious Diseases  Spoke to OB-GYN Dr. Arlina Robes  Procedures: None  Antimicrobials:  Anti-infectives (From admission, onward)   Start     Dose/Rate Route Frequency Ordered Stop   04/23/20 1415  levofloxacin (LEVAQUIN) tablet 500 mg        500 mg Oral Daily 04/23/20 1318     04/21/20 1000  acyclovir (ZOVIRAX) tablet 400 mg        400 mg Oral 2 times daily 04/21/20 0750     04/21/20 1000  posaconazole (NOXAFIL) delayed-release tablet 300 mg        300 mg Oral Daily 04/21/20 0750          Subjective: Seen and examined at bedside and is to feel weak.  Electrolytes are improved.  Denies any chest pain or shortness of breath.  States that she does have some leg lesions that have worsened. No other concerns or complaints at this time.   Objective: Vitals:   04/22/20 1432 04/22/20 1957 04/23/20 0441 04/23/20 0756  BP: (!) 106/57 (!) 143/68 (!) 143/74 (!) 142/70  Pulse: 70 76 78 73  Resp: 16 18 17 18   Temp: 97.7 F (36.5 C) 98.1 F (36.7 C) 98 F (36.7 C) 97.7 F (  36.5 C)  TempSrc: Oral Oral Oral Axillary  SpO2: 92% 93% 94% 97%  Weight:      Height:        Intake/Output Summary (Last 24 hours) at 04/23/2020 1323 Last data filed at 04/23/2020 1030 Gross per 24 hour  Intake 1418.57 ml  Output --  Net 1418.57 ml   Filed Weights   04/20/20 1500  Weight: 84 kg   Examination: Physical Exam:  Constitutional: WN/WD chronically ill appearing Caucasian female in NAD and appears calm but fatigued appearing  Eyes: Lids and conjunctivae normal, sclerae anicteric  ENMT:  External Ears, Nose appear normal. Grossly normal hearing. Mucous membranes are moist. Posterior pharynx clear of any exudate or lesions. Normal dentition.  Neck: Appears normal, supple, no cervical masses, normal ROM, no appreciable thyromegaly; no JVD Respiratory: Diminished to auscultation bilaterally, no wheezing, rales, rhonchi or crackles. Normal respiratory effort and patient is not tachypenic. No accessory muscle use. Unlabored breathing Cardiovascular: RRR, no murmurs / rubs / gallops. S1 and S2 auscultated. 1+ LE edema Abdomen: Soft, non-tender, Distended 2/2 body habitus. Has a rash on the lower abdomen. Bowel sounds positive x4.  GU: Deferred. Musculoskeletal: No clubbing / cyanosis of digits/nails. No joint deformity upper and lower extremities.  Skin: Has erythematous raised rash on the LE and a Bx site on right leg with a suture in it. No induration; Warm and dry.  Neurologic: CN 2-12 grossly intact with no focal deficits.  Romberg sign and cerebellar reflexes not assessed.  Psychiatric: Normal judgment and insight. Alert and oriented x 3. Normal mood and appropriate affect.   Data Reviewed: I have personally reviewed following labs and imaging studies  CBC: Recent Labs  Lab 04/17/20 1230 04/20/20 1117 04/21/20 0606 04/22/20 0532 04/23/20 0752  WBC 0.6* 1.0* 1.0* 1.5* 2.5*  NEUTROABS 0.2* 0.1*  --   --  0.2*  HGB 8.5* 8.1* 7.2* 8.0* 8.3*  HCT 25.7* 23.3* 21.3* 23.9* 26.3*  MCV 93.1 89.6 92.6 93.0 96.7  PLT 17* 9* 15* 12* 13*   Basic Metabolic Panel: Recent Labs  Lab 04/17/20 1230 04/20/20 1117 04/21/20 0606 04/22/20 0532 04/23/20 0752  NA 140 142 140 140 143  K 3.4* 2.9* 2.4* 2.6* 3.9  CL 102 101 104 102 105  CO2 27 26 29 28 26   GLUCOSE 103* 104* 118* 100* 100*  BUN 4* <4* <5* <5* <5*  CREATININE 0.66 0.63 0.53 0.55 0.73  CALCIUM 8.2* 8.3* 7.8* 8.0* 8.7*  MG  --   --   --  1.4* 2.0  PHOS  --   --   --  3.5 2.6   GFR: Estimated Creatinine Clearance: 83.8  mL/min (by C-G formula based on SCr of 0.73 mg/dL). Liver Function Tests: Recent Labs  Lab 04/17/20 1230 04/20/20 1117 04/21/20 0606 04/23/20 0752  AST 21 25 21  33  ALT 12 12 12 11   ALKPHOS 239* 186* 137* 142*  BILITOT 1.0 1.0 0.9 1.0  PROT 5.7* 5.6* 4.9* 5.3*  ALBUMIN 2.9* 3.0* 2.7* 3.0*   No results for input(s): LIPASE, AMYLASE in the last 168 hours. No results for input(s): AMMONIA in the last 168 hours. Coagulation Profile: Recent Labs  Lab 04/21/20 0606  INR 1.3*   Cardiac Enzymes: No results for input(s): CKTOTAL, CKMB, CKMBINDEX, TROPONINI in the last 168 hours. BNP (last 3 results) No results for input(s): PROBNP in the last 8760 hours. HbA1C: No results for input(s): HGBA1C in the last 72 hours. CBG: No results for input(s):  GLUCAP in the last 168 hours. Lipid Profile: No results for input(s): CHOL, HDL, LDLCALC, TRIG, CHOLHDL, LDLDIRECT in the last 72 hours. Thyroid Function Tests: No results for input(s): TSH, T4TOTAL, FREET4, T3FREE, THYROIDAB in the last 72 hours. Anemia Panel: No results for input(s): VITAMINB12, FOLATE, FERRITIN, TIBC, IRON, RETICCTPCT in the last 72 hours. Sepsis Labs: No results for input(s): PROCALCITON, LATICACIDVEN in the last 168 hours.  Recent Results (from the past 240 hour(s))  SARS CORONAVIRUS 2 (TAT 6-24 HRS) Nasopharyngeal Nasopharyngeal Swab     Status: None   Collection Time: 04/21/20  1:37 AM   Specimen: Nasopharyngeal Swab  Result Value Ref Range Status   SARS Coronavirus 2 NEGATIVE NEGATIVE Final    Comment: (NOTE) SARS-CoV-2 target nucleic acids are NOT DETECTED.  The SARS-CoV-2 RNA is generally detectable in upper and lower respiratory specimens during the acute phase of infection. Negative results do not preclude SARS-CoV-2 infection, do not rule out co-infections with other pathogens, and should not be used as the sole basis for treatment or other patient management decisions. Negative results must be combined  with clinical observations, patient history, and epidemiological information. The expected result is Negative.  Fact Sheet for Patients: SugarRoll.be  Fact Sheet for Healthcare Providers: https://www.woods-mathews.com/  This test is not yet approved or cleared by the [place] FDA and  has been authorized for detection and/or diagnosis of SARS-CoV-2 by FDA under an Emergency Use Authorization (EUA). This EUA will remain  in effect (meaning this test can be used) for the duration of the COVID-19 declaration under Se ction 564(b)(1) of the Act, 21 U.S.C. section 360bbb-3(b)(1), unless the authorization is terminated or revoked sooner.  Performed at [facility], [place] [address] [place], [place] [address]     RN Pressure Injury Documentation:     Estimated body mass index is 28.16 kg/m as calculated from the following:   Height as of this encounter: 5' 7.99" (1.727 m).   Weight as of this encounter: 84 kg.  Malnutrition Type:  Nutrition Problem: Moderate Malnutrition Etiology: chronic illness,cancer and cancer related treatments  Malnutrition Characteristics:  Signs/Symptoms: mild fat depletion,mild muscle depletion  Nutrition Interventions:  Interventions: Ensure Enlive (each supplement provides 350kcal and 20 grams of protein),MVI   Radiology Studies: No results found.  Scheduled Meds: . sodium chloride   Intravenous Once  . acyclovir  400 mg Oral BID  . cholecalciferol  2,000 Units Oral Daily  . escitalopram  5 mg Oral Daily  . feeding supplement  237 mL Oral TID BM  . levofloxacin  500 mg Oral Daily  . multivitamin with minerals  1 tablet Oral Daily  . oxyCODONE  20 mg Oral Q12H  . pantoprazole  40 mg Oral Daily  . posaconazole  300 mg Oral Daily  . pregabalin  150 mg Oral BID  . traZODone  50 mg Oral QHS   Continuous Infusions:   LOS: 3 days   Kerney Elbe, DO [facility] PAGER is on  AMION  If 7PM-7AM, please contact night-coverage [email or web address]

## 2020-04-23 NOTE — Consult Note (Signed)
[name] for Infectious Disease    Date of Admission:  04/20/2020     Reason for Consult: Lower extremity rash     Referring Physician: Dr Alfredia Ferguson  Current antibiotics: Acyclovir PPx Posaconazole PPx  Previous antibiotics: Levaquin PPx prior to admission   ASSESSMENT & PLAN:    #Bilateral lower extremity rash and abdominal rash: Unclear etiology of violaceous papular lesions on lower extremities and more proximal lesions with circular appearing erythematous plaques.  Differential diagnosis includes inflammatory dermatitis with secondary hemorrhage due to thrombocytopenia versus infectious (i.e. fungal, bacterial, viral) in the setting of prolonged neutropenia versus Sweet syndrome or leukemia cutis.  Recent hospitalization at [facility] was unremarkable for an infectious etiology and biopsy of now resolved cluster of papules on her right knee was notable for spongiosis with hemorrhage which may have represented an adverse drug reaction or spongiotic process.  However, she has had progression of these new lesions on her lower extremities with continued neutropenia with absolute neutrophil count of 100, platelets are 13.  -Will informally discuss with dermatology since this service is unfortunately not available at our institution and recommend work up following that discussion.  Further recs to come.  #AML: Followed at [facility] with Dr. Elmo Putt as well as locally with [name] #Pancytopenia with severe neutropenia: ANC 100  -Continue acyclovir and posaconazole prophylaxis -Resume levaquin for neutropenic fever prophylaxis  Principal Problem:   AML (acute myeloblastic leukemia) ([place]) Active Problems:   Neutropenia (HCC)   Thrombocytopenia (HCC)   Symptomatic anemia   Malnutrition of moderate degree   Rash and nonspecific skin eruption   MEDICATIONS:    Scheduled Meds: . acyclovir  400 mg Oral BID  . cholecalciferol  2,000 Units Oral Daily  . escitalopram  5  mg Oral Daily  . feeding supplement  237 mL Oral TID BM  . multivitamin with minerals  1 tablet Oral Daily  . oxyCODONE  20 mg Oral Q12H  . pantoprazole  40 mg Oral Daily  . posaconazole  300 mg Oral Daily  . pregabalin  150 mg Oral BID  . traZODone  50 mg Oral QHS   Continuous Infusions: PRN Meds:.ALPRAZolam, ondansetron (ZOFRAN) IV  HPI:    Kelly Roberson is a 60 y.o. female with a past medical history significant for AML status post induction therapy and 3 cycles of consolidation with subsequent relapse and treatment with decitabine/venetoclax who presented to [facility] on 04/20/2020 directly from her oncologist office in the setting of weakness, deconditioning, recent falls and marked thrombocytopenia, anemia, and prolonged neutropenia.    She was recently admitted at [facility] for approximately 2 months from 02/17/2020 through 04/15/2020 due to relapsed AML.  During this hospitalization she began decitabine and venetoclax.  At discharge she was prescribed acyclovir and Levaquin prophylaxis.  She was also prescribed posaconazole prophylaxis which was unavailable at time of her discharge but was subsequently authorized the following day per care everywhere notes.  Her hospital course was complicated by neutropenic fever throughout her hospitalization as well as line associated skin and soft tissue infection.  Her hospital course was also complicated by a erythematous skin lesion on her right knee that was evaluated by dermatology while inpatient at [facility].  This was concerning for invasive fungal infection in the context of recurrent fevers and neutropenia.  She underwent a serological evaluation as well as skin biopsy.  Serologic work-up included negative histoplasma, cryptococcal antigen, Aspergillus antigen, beta D glucan.  Biopsy of  the right knee showed a spongiotic dermatitis of unknown origin without evidence of Sweet syndrome or hematolymphoid infiltrate.  Viral,  bacterial, and AFB stains were all negative.  Per the final pathology report the findings are those of spongiosis with hemorrhage in the dermis that may represent an adverse drug reaction or spongiotic process (contact/irritant).  Microbiology culture results are not readily available and is unclear if these were performed despite the negative special stains.  Since discharge from [place] on 4/13, her cluster of violaceous papules on the right knee has resolved itself.  However she now has bilateral lower extremity lesions as well as some areas involving her abdomen.  She has not had any further fevers since leaving [facility].  She reports that the lesions on her legs are nontender and do not itch.  She reports no prior history of rash similar to this.  We are consulted due to concern for ringworm infection and to provide dermatologic guidance.   Past Medical History:  Diagnosis Date  . Anxiety   . Cancer ([place])   . Hepatitis B   . Hepatitis C   . Hypertension   . IBS (irritable bowel syndrome)   . RLS (restless legs syndrome) 01/09/2020    Social History   Tobacco Use  . Smoking status: Former Research scientist (life sciences)  . Smokeless tobacco: Never Used  Vaping Use  . Vaping Use: Never used  Substance Use Topics  . Alcohol use: Not Currently    Comment: occasionally   . Drug use: Not Currently    Family History  Problem Relation Age of Onset  . Pancreatic cancer Father   . Colon cancer Maternal Aunt   . Diabetes Maternal Uncle   . Pancreatic cancer Maternal Grandfather     Allergies  Allergen Reactions  . Ace Inhibitors     Other reaction(s): Cough  . Lisinopril Cough  . Tylenol [Acetaminophen] Hives and Itching    Pt. States she only itches from tylenol     Review of Systems  Constitutional: Negative.   Respiratory: Negative.   Cardiovascular: Positive for leg swelling. Negative for chest pain.  Gastrointestinal: Negative.   Skin: Positive for rash. Negative for itching.  Neurological: Negative  for headaches.  Endo/Heme/Allergies: Bruises/bleeds easily.  All other systems reviewed and are negative.   OBJECTIVE:   Blood pressure (!) 142/70, pulse 73, temperature 97.7 F (36.5 C), temperature source Axillary, resp. rate 18, height 5' 7.99" (1.727 m), weight 84 kg, SpO2 97 %. Body mass index is 28.16 kg/m.  Physical Exam Constitutional:      General: She is not in acute distress.    Appearance: Normal appearance.  HENT:     Head: Normocephalic and atraumatic.     Mouth/Throat:     Mouth: Mucous membranes are moist.     Pharynx: Oropharynx is clear.  Eyes:     Extraocular Movements: Extraocular movements intact.     Conjunctiva/sclera: Conjunctivae normal.  Pulmonary:     Effort: Pulmonary effort is normal. No respiratory distress.  Musculoskeletal:     Cervical back: Normal range of motion and neck supple.     Right lower leg: Edema present.     Left lower leg: Edema present.  Skin:    General: Skin is warm and dry.     Findings: Bruising, erythema and rash present.  Neurological:     General: No focal deficit present.     Mental Status: She is alert and oriented to person, place, and time.  Psychiatric:  Mood and Affect: Mood normal.        Behavior: Behavior normal.            Lab Results: Lab Results  Component Value Date   WBC 2.5 (L) 04/23/2020   HGB 8.3 (L) 04/23/2020   HCT 26.3 (L) 04/23/2020   MCV 96.7 04/23/2020   PLT 13 (LL) 04/23/2020    Lab Results  Component Value Date   NA 143 04/23/2020   K 3.9 04/23/2020   CO2 26 04/23/2020   GLUCOSE 100 (H) 04/23/2020   BUN <5 (L) 04/23/2020   CREATININE 0.73 04/23/2020   CALCIUM 8.7 (L) 04/23/2020   GFRNONAA >60 04/23/2020   GFRAA >60 10/04/2019    Lab Results  Component Value Date   ALT 11 04/23/2020   AST 33 04/23/2020   ALKPHOS 142 (H) 04/23/2020   BILITOT 1.0 04/23/2020    No results found for: CRP     Component Value Date/Time   ESRSEDRATE 9 01/10/2020 0935    I  have reviewed the micro and lab results in Epic.  Imaging: No results found.   Imaging independently reviewed in Epic.  Raynelle [facility] for Infectious Disease [facility] [phone] pager 04/23/2020, 1:01 PM  I spent greater than 110 minutes with the patient including greater than 50% of time in face to face counsel of the patient and in coordination of their care.

## 2020-04-24 DIAGNOSIS — D701 Agranulocytosis secondary to cancer chemotherapy: Secondary | ICD-10-CM | POA: Diagnosis not present

## 2020-04-24 DIAGNOSIS — T451X5A Adverse effect of antineoplastic and immunosuppressive drugs, initial encounter: Secondary | ICD-10-CM | POA: Diagnosis not present

## 2020-04-24 DIAGNOSIS — C9202 Acute myeloblastic leukemia, in relapse: Secondary | ICD-10-CM | POA: Diagnosis not present

## 2020-04-24 DIAGNOSIS — E440 Moderate protein-calorie malnutrition: Secondary | ICD-10-CM | POA: Diagnosis not present

## 2020-04-24 DIAGNOSIS — D649 Anemia, unspecified: Secondary | ICD-10-CM | POA: Diagnosis not present

## 2020-04-24 DIAGNOSIS — D61818 Other pancytopenia: Secondary | ICD-10-CM | POA: Diagnosis not present

## 2020-04-24 DIAGNOSIS — R531 Weakness: Secondary | ICD-10-CM | POA: Diagnosis not present

## 2020-04-24 DIAGNOSIS — R21 Rash and other nonspecific skin eruption: Secondary | ICD-10-CM | POA: Diagnosis not present

## 2020-04-24 LAB — CBC WITH DIFFERENTIAL/PLATELET
Abs Immature Granulocytes: 0.1 10*3/uL — ABNORMAL HIGH (ref 0.00–0.07)
Basophils Absolute: 0 10*3/uL (ref 0.0–0.1)
Basophils Relative: 0 %
Blasts: 33 %
Eosinophils Absolute: 0 10*3/uL (ref 0.0–0.5)
Eosinophils Relative: 0 %
HCT: 24.8 % — ABNORMAL LOW (ref 36.0–46.0)
Hemoglobin: 7.9 g/dL — ABNORMAL LOW (ref 12.0–15.0)
Lymphocytes Relative: 61 %
Lymphs Abs: 2.7 10*3/uL (ref 0.7–4.0)
MCH: 30.5 pg (ref 26.0–34.0)
MCHC: 31.9 g/dL (ref 30.0–36.0)
MCV: 95.8 fL (ref 80.0–100.0)
Monocytes Absolute: 0.1 10*3/uL (ref 0.1–1.0)
Monocytes Relative: 2 %
Myelocytes: 2 %
Neutro Abs: 0.1 10*3/uL — CL (ref 1.7–7.7)
Neutrophils Relative %: 2 %
Platelets: 28 10*3/uL — CL (ref 150–400)
RBC: 2.59 MIL/uL — ABNORMAL LOW (ref 3.87–5.11)
RDW: 16 % — ABNORMAL HIGH (ref 11.5–15.5)
WBC: 4.4 10*3/uL (ref 4.0–10.5)
nRBC: 0.7 % — ABNORMAL HIGH (ref 0.0–0.2)

## 2020-04-24 LAB — PHOSPHORUS: Phosphorus: 2.9 mg/dL (ref 2.5–4.6)

## 2020-04-24 LAB — COMPREHENSIVE METABOLIC PANEL
ALT: 14 U/L (ref 0–44)
AST: 39 U/L (ref 15–41)
Albumin: 2.9 g/dL — ABNORMAL LOW (ref 3.5–5.0)
Alkaline Phosphatase: 150 U/L — ABNORMAL HIGH (ref 38–126)
Anion gap: 9 (ref 5–15)
BUN: <5 mg/dL — ABNORMAL LOW (ref 8–23)
CO2: 29 mmol/L (ref 22–32)
Calcium: 8.7 mg/dL — ABNORMAL LOW (ref 8.9–10.3)
Chloride: 105 mmol/L (ref 98–111)
Creatinine, Ser: 0.79 mg/dL (ref 0.44–1.00)
GFR, Estimated: >60 mL/min (ref >60)
Glucose, Bld: 127 mg/dL — ABNORMAL HIGH (ref 70–99)
Potassium: 3.5 mmol/L (ref 3.5–5.1)
Sodium: 143 mmol/L (ref 135–145)
Total Bilirubin: 0.9 mg/dL (ref 0.3–1.2)
Total Protein: 5.2 g/dL — ABNORMAL LOW (ref 6.5–8.1)

## 2020-04-24 LAB — BPAM PLATELET PHERESIS
Blood Product Expiration Date: 202204222359
ISSUE DATE / TIME: 202204211555
Unit Type and Rh: 600

## 2020-04-24 LAB — PREPARE PLATELET PHERESIS: Unit division: 0

## 2020-04-24 LAB — MAGNESIUM: Magnesium: 1.6 mg/dL — ABNORMAL LOW (ref 1.7–2.4)

## 2020-04-24 MED ORDER — POTASSIUM CHLORIDE 10 MEQ/100ML IV SOLN
10.0000 meq | INTRAVENOUS | Status: AC
  Administered 2020-04-24 (×4): 10 meq via INTRAVENOUS
  Filled 2020-04-24 (×4): qty 100

## 2020-04-24 MED ORDER — MAGNESIUM SULFATE 4 GM/100ML IV SOLN
4.0000 g | Freq: Once | INTRAVENOUS | Status: AC
  Administered 2020-04-24: 4 g via INTRAVENOUS
  Filled 2020-04-24: qty 100

## 2020-04-24 MED ORDER — OXYCODONE HCL 5 MG PO TABS
5.0000 mg | ORAL_TABLET | ORAL | Status: DC | PRN
  Administered 2020-04-24 – 2020-05-01 (×9): 5 mg via ORAL
  Filled 2020-04-24 (×10): qty 1

## 2020-04-24 MED ORDER — TERBINAFINE HCL 1 % EX CREA
TOPICAL_CREAM | Freq: Two times a day (BID) | CUTANEOUS | Status: DC
  Administered 2020-04-29: 1 via TOPICAL
  Filled 2020-04-24 (×3): qty 12

## 2020-04-24 NOTE — Progress Notes (Signed)
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 Found to have pancytopenia and peripheral blasts with intracellular inclusions.  2) 05/01/2019: transferred to [facility] 3) 05/04/2019: patient started induction therapy for AML with 7+3 and gemtuzumab on Day 4. 4) 05/16/2019: repeat Bmbx shows no residual leukemia 5) 06/17/2019: patient returned for Cycle 1 Consolidation with cytarabine and gemtuzumab 6) 06/24/2019: presents to reconnect with [facility] for co-managed care. 7) 07/15/2019: Cycle 2 of consolidation therapy  8) 07/30/2019: admitted for BRBPR/ Infectious colitis.  9) 08/26/2019: start of Cycle 3 of consolidation therapy at [facility]  10) 09/09/2019: admitted withneutropenicfever and BRBPR 11) 10/23/2019: evaluated by Dr. Elmo Putt. After discussion with him decided against oral azacitidine therapy 12) 02/17/2020-04/15/2020: admitted to [facility] service. Treated with Decitabine/Venetoclax x 2 cycles  Interval History:  Kelly Roberson 60 y.o. female with medical history significant for AML who was admitted for worsening weakness and severe cytopenias. The patient's last visit was on Monday at which time she was willing to be admitted to [facility] for rehab and bolstering of her blood counts.  Exam today she is working with physical therapy.  Her white blood cell count has increased to 4.4, however unfortunately this is mostly blast cells.  Her hemoglobin has been stable around 8 and her platelet count is at 28 today.  She is developed lesions on her lower extremities for which ID was consulted but does not believe to be infectious in nature.  Otherwise she currently denies any fevers,  chills, sweats, nausea, vomiting or diarrhea.  She is eating well.  A full 10 point ROS is listed below.  The bulk of our discussion focused on the finding of blast cells which are increasing her total white count and the options moving forward.  This conversation is detailed below.  MEDICAL HISTORY:  Past Medical History:  Diagnosis Date  . Anxiety   . Cancer ([employer])   . Hepatitis B   . Hepatitis C   . Hypertension   . IBS (irritable bowel syndrome)   . RLS (restless legs syndrome) 01/09/2020    SURGICAL HISTORY: Past Surgical History:  Procedure Laterality Date  . GASTRIC BYPASS  2013  . NISSEN FUNDOPLICATION  [id number]   Reversed in 2013  . ORIF ANKLE FRACTURE Left 06/2017  . PLACEMENT OF BREAST IMPLANTS      SOCIAL HISTORY: Social History   Socioeconomic History  . Marital status: Divorced    Spouse name: Not on file  . Number of children: Not on file  . Years of education: Not on file  . Highest education level: Not on file  Occupational History  . Occupation: disability  Tobacco Use  . Smoking status: Former Research scientist (life sciences)  . Smokeless tobacco: Never Used  Vaping Use  . Vaping Use: Never used  Substance and Sexual Activity  . Alcohol use: Not Currently    Comment: occasionally   . Drug use: Not Currently  . Sexual activity: Not Currently  Other Topics Concern  . Not on file  Social History Narrative   Lives alone   Right Handed   Drinks 2-3 cups caffeine daily   Social Determinants of Health   Financial Resource Strain: Not on file  Food Insecurity: Not on file  Transportation Needs:  Not on file  Physical Activity: Not on file  Stress: Not on file  Social Connections: Not on file  Intimate Partner Violence: Not on file    FAMILY HISTORY: Family History  Problem Relation Age of Onset  . Pancreatic cancer Father   . Colon cancer Maternal Aunt   . Diabetes Maternal Uncle   . Pancreatic cancer Maternal Grandfather     ALLERGIES:  is allergic to ace  inhibitors, lisinopril, and tylenol [acetaminophen].  MEDICATIONS:  Current Facility-Administered Medications  Medication Dose Route Frequency Provider Last Rate Last Admin  . acyclovir (ZOVIRAX) tablet 400 mg  400 mg Oral BID Little Ishikawa, MD   400 mg at 04/24/20 4008  . ALPRAZolam Duanne Moron) tablet 0.5 mg  0.5 mg Oral QHS PRN Little Ishikawa, MD      . cholecalciferol (VITAMIN D) tablet 2,000 Units  2,000 Units Oral Daily Little Ishikawa, MD   2,000 Units at 04/24/20 [phone]  . escitalopram (LEXAPRO) tablet 5 mg  5 mg Oral Daily Little Ishikawa, MD   5 mg at 04/24/20 9509  . feeding supplement (ENSURE ENLIVE / ENSURE PLUS) liquid 237 mL  237 mL Oral TID BM Little Ishikawa, MD   237 mL at 04/24/20 1028  . levofloxacin (LEVAQUIN) tablet 500 mg  500 mg Oral Daily Mignon Pine, DO   500 mg at 04/24/20 3267  . multivitamin with minerals tablet 1 tablet  1 tablet Oral Daily Little Ishikawa, MD   1 tablet at 04/24/20 [phone]  . ondansetron (ZOFRAN) injection 4 mg  4 mg Intravenous Q6H PRN Opyd, Ilene Qua, MD   4 mg at 04/21/20 1048  . oxyCODONE (Oxy IR/ROXICODONE) immediate release tablet 5 mg  5 mg Oral Q4H PRN Raiford Noble Manlius, DO   5 mg at 04/24/20 1449  . oxyCODONE (OXYCONTIN) 12 hr tablet 20 mg  20 mg Oral Q12H Little Ishikawa, MD   20 mg at 04/24/20 8099  . pantoprazole (PROTONIX) EC tablet 40 mg  40 mg Oral Daily Little Ishikawa, MD   40 mg at 04/24/20 8338  . posaconazole (NOXAFIL) delayed-release tablet 300 mg  300 mg Oral Daily Little Ishikawa, MD   300 mg at 04/24/20 2505  . pregabalin (LYRICA) capsule 150 mg  150 mg Oral BID Little Ishikawa, MD   150 mg at 04/24/20 3976  . terbinafine (LAMISIL) 1 % cream   Topical BID Mignon Pine, DO   Given at 04/24/20 1609  . traZODone (DESYREL) tablet 50 mg  50 mg Oral QHS Little Ishikawa, MD   50 mg at 04/23/20 2255    REVIEW OF SYSTEMS:   Constitutional: ( - ) fevers, ( - )  chills , (  - ) night sweats Eyes: ( - ) blurriness of vision, ( - ) double vision, ( - ) watery eyes Ears, nose, mouth, throat, and face: ( - ) mucositis, ( - ) sore throat Respiratory: ( - ) cough, ( - ) dyspnea, ( - ) wheezes Cardiovascular: ( - ) palpitation, ( - ) chest discomfort, ( - ) lower extremity swelling Gastrointestinal:  ( - ) nausea, ( - ) heartburn, ( - ) change in bowel habits Skin: ( - ) abnormal skin rashes Lymphatics: ( - ) new lymphadenopathy, ( - ) easy bruising Neurological: ( - ) numbness, ( - ) tingling, ( - ) new weaknesses Behavioral/Psych: ( - ) mood change, ( - ) new  changes  All other systems were reviewed with the patient and are negative.  PHYSICAL EXAMINATION:  Vitals:   04/24/20 0455 04/24/20 1345  BP: (!) 142/67 114/70  Pulse: 81 77  Resp: 18 18  Temp: 98.2 F (36.8 C) 98 F (36.7 C)  SpO2: 96% 93%   Filed Weights   04/20/20 1500  Weight: 185 lb 3 oz (84 kg)    GENERAL: chronically ill appearing middle aged Caucasian female, alert, no distress and comfortable SKIN: splotchy violaceous lesion on lower extremities bilaterally.  EYES: conjunctiva are pink and non-injected, sclera clear LUNGS: clear to auscultation and percussion with normal breathing effort HEART: regular rate & rhythm and no murmurs and no lower extremity edema Musculoskeletal: no cyanosis of digits and no clubbing  PSYCH: alert & oriented x 3, fluent speech NEURO: no focal motor/sensory deficits  LABORATORY DATA:  I have reviewed the data as listed CBC Latest Ref Rng & Units 04/24/2020 04/23/2020 04/22/2020  WBC 4.0 - 10.5 K/uL 4.4 2.5(L) 1.5(L)  Hemoglobin 12.0 - 15.0 g/dL 7.9(L) 8.3(L) 8.0(L)  Hematocrit 36.0 - 46.0 % 24.8(L) 26.3(L) 23.9(L)  Platelets 150 - 400 K/uL 28(LL) 13(LL) 12(LL)    CMP Latest Ref Rng & Units 04/24/2020 04/23/2020 04/22/2020  Glucose 70 - 99 mg/dL 127(H) 100(H) 100(H)  BUN 8 - 23 mg/dL <5(L) <5(L) <5(L)  Creatinine 0.44 - 1.00 mg/dL 0.79 0.73 0.55  Sodium  135 - 145 mmol/L 143 143 140  Potassium 3.5 - 5.1 mmol/L 3.5 3.9 2.6(LL)  Chloride 98 - 111 mmol/L 105 105 102  CO2 22 - 32 mmol/L 29 26 28   Calcium 8.9 - 10.3 mg/dL 8.7(L) 8.7(L) 8.0(L)  Total Protein 6.5 - 8.1 g/dL 5.2(L) 5.3(L) -  Total Bilirubin 0.3 - 1.2 mg/dL 0.9 1.0 -  Alkaline Phos 38 - 126 U/L 150(H) 142(H) -  AST 15 - 41 U/L 39 33 -  ALT 0 - 44 U/L 14 11 -    Lab Results  Component Value Date   MPROTEIN Not Observed 01/10/2020   No results found for: KPAFRELGTCHN, LAMBDASER, KAPLAMBRATIO   BLOOD FILM: 04/24/2020: Review of the peripheral blood smear showed few white cells with most being blast cells. Red cells show no anisopoikilocytosis, macrocytes , microcytes or polychromasia. There were no schistocytes, target cells, echinocytes, acanthocytes, dacrocytes, or stomatocytes.There was no rouleaux formation, nucleated red cells, or intra-cellular inclusions noted. The platelets are normal in size, shape, and color without any clumping evident.  RADIOGRAPHIC STUDIES: I have personally reviewed the radiological images as listed and agreed with the findings in the report. DG Shoulder Right  Result Date: 04/16/2020 CLINICAL DATA:  Fall, pain, bruising EXAM: RIGHT SHOULDER - 2+ VIEW; RIGHT HUMERUS - 2+ VIEW; RIGHT FOREARM - 2 VIEW COMPARISON:  None. FINDINGS: No fracture or dislocation of the right shoulder. Mild acromioclavicular and glenohumeral arthrosis. The partially included right chest is unremarkable. No fracture or dislocation of the right humerus. No fracture or dislocation of the right radius or ulna. IMPRESSION: 1. No fracture or dislocation of the right shoulder. Mild acromioclavicular and glenohumeral arthrosis. 2. No fracture or dislocation of the right humerus. 3. No fracture or dislocation of the right radius or ulna. Electronically Signed   By: Eddie Candle M.D.   On: 04/16/2020 15:51   DG Forearm Right  Result Date: 04/16/2020 CLINICAL DATA:  Fall, pain, bruising  EXAM: RIGHT SHOULDER - 2+ VIEW; RIGHT HUMERUS - 2+ VIEW; RIGHT FOREARM - 2 VIEW COMPARISON:  None. FINDINGS: No  fracture or dislocation of the right shoulder. Mild acromioclavicular and glenohumeral arthrosis. The partially included right chest is unremarkable. No fracture or dislocation of the right humerus. No fracture or dislocation of the right radius or ulna. IMPRESSION: 1. No fracture or dislocation of the right shoulder. Mild acromioclavicular and glenohumeral arthrosis. 2. No fracture or dislocation of the right humerus. 3. No fracture or dislocation of the right radius or ulna. Electronically Signed   By: Eddie Candle M.D.   On: 04/16/2020 15:51   CT Head Wo Contrast  Result Date: 04/16/2020 CLINICAL DATA:  Fall, hematoma to right eyebrow EXAM: CT HEAD WITHOUT CONTRAST TECHNIQUE: Contiguous axial images were obtained from the base of the skull through the vertex without intravenous contrast. COMPARISON:  None. FINDINGS: Brain: No evidence of acute infarction, hemorrhage, hydrocephalus, extra-axial collection or mass lesion/mass effect. Vascular: No hyperdense vessel or unexpected calcification. Skull: Normal. Negative for fracture or focal lesion. Sinuses/Orbits: No acute finding. Other: Soft tissue contusion of the right brow (series 3, image 5). IMPRESSION: 1. No acute intracranial pathology. 2. Soft tissue contusion of the right brow. Electronically Signed   By: Eddie Candle M.D.   On: 04/16/2020 15:06   DG Humerus Right  Result Date: 04/16/2020 CLINICAL DATA:  Fall, pain, bruising EXAM: RIGHT SHOULDER - 2+ VIEW; RIGHT HUMERUS - 2+ VIEW; RIGHT FOREARM - 2 VIEW COMPARISON:  None. FINDINGS: No fracture or dislocation of the right shoulder. Mild acromioclavicular and glenohumeral arthrosis. The partially included right chest is unremarkable. No fracture or dislocation of the right humerus. No fracture or dislocation of the right radius or ulna. IMPRESSION: 1. No fracture or dislocation of the right  shoulder. Mild acromioclavicular and glenohumeral arthrosis. 2. No fracture or dislocation of the right humerus. 3. No fracture or dislocation of the right radius or ulna. Electronically Signed   By: Eddie Candle M.D.   On: 04/16/2020 15:51   US PELVIC COMPLETE WITH TRANSVAGINAL  Result Date: 04/23/2020 CLINICAL DATA:  Vaginal bleeding EXAM: TRANSABDOMINAL AND TRANSVAGINAL ULTRASOUND OF PELVIS TECHNIQUE: Both transabdominal and transvaginal ultrasound examinations of the pelvis were performed. Transabdominal technique was performed for global imaging of the pelvis including uterus, ovaries, adnexal regions, and pelvic cul-de-sac. It was necessary to proceed with endovaginal exam following the transabdominal exam to attempt to visualize the uterus endometrium ovaries. COMPARISON:  CT 09/09/2019 FINDINGS: Uterus Transvaginal images or markedly limited by patient pain tolerance and shadowing. Measurements: 8.9 x 4.5 x 5.8 cm = volume: 114.7 mL. Diffuse echogenic foci throughout the myometrium. Endometrium Thickness: 4.4 mm.  No focal abnormality visualized. Right ovary Measurements: 2.5 x 2.1 x 2 cm = volume: 5.4 mL. Normal appearance/no adnexal mass. Left ovary Measurements: 2.6 x 2.1 x 2.2 cm = volume: 6.3 mL. Normal appearance/no adnexal mass. Other findings No abnormal free fluid. IMPRESSION: 1. Limited study. Transvaginal images of limited utility due to pain. 2. Post menopausal endometrial thickness grossly within normal limits. 3. Heterogeneous myometrial echotexture with numerous echogenic foci indeterminate for microcalcifications from prior inflammation or infection versus vascular calcification versus echogenic foci associated with adenomyosis though no adequate transvaginal images are available. Depending upon clinical course, evaluation with pelvic MRI could be considered. Electronically Signed   By: Donavan Foil M.D.   On: 04/23/2020 19:01    ASSESSMENT & PLAN Lynessa Kopplin 60 y.o. female with  medical history significant for AML who was admitted for worsening weakness and severe cytopenias.  # Increasing Blast Count # Progression of Leukemia # Lower Extremity  Rash, potentially Leukemia cutis -- Unfortunately her CBC shows up to 33% blasts which is a major driving force behind her increasing white blood cell count -- Findings are strongly consistent with progression of disease.  Will be discussed her care with Dr. Lysle Dingwall [facility] to see if there are any other available options for this patient. -- If no other options are available may need to consider comfort based care/hospice -- Findings on lower extremities may represent leukemia cutis with deposition of the blast cells within the skin. -- Recommend continue to work with physical therapy over the weekend with consideration of transfer to [facility] or [facility] early next week in the event there are further treatment options available for this patient -- Hematology will continue to follow closely.   # Weakness/Deconditioning #Fall From Standing --Patient was taken to the emergency room on 04/16/2020 for a fall from standing with trauma to the head. --This occurred 1 day after discharge from [facility].  Her admission there lasted approximately 2 months. --continue PT and evaluation for consideration of rehab facility. -- We admitted her directly from clinic up to the 6th floor of [facility].  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 Elmo Putt providing recommendations. We are happy to provide blood checks and treatment for urgent issues locally. --patient completed induction and 3 cycles of consolidation therapy. Unfortunately she had relapsed disease and was treated with decitabine/Venetoclax in inpatient setting, with undetectable blasts at end of  treatment.  -- Her Hgb and Plt are quite low. Transfusion threshold Hgb <7.0 or Plt <10 --continue f/u at [facility] with Dr. Elmo Putt. We will continue to monitor her closely locally.  All questions were answered. The patient knows to call the clinic with any problems, questions or concerns.  A total of more than 30 minutes were spent on this encounter and over half of that time was spent on counseling and coordination of care as outlined above.   Ledell Peoples, MD Department of Hematology/Oncology [place] [facility] Phone: [phone] Pager: [phone] Email: [email or web address]  04/24/2020 6:28 PM

## 2020-04-24 NOTE — Progress Notes (Signed)
Physical Therapy Treatment Patient Details Name: Kelly Roberson MRN: [id number] DOB: [birth year] Today's Date: 04/24/2020    History of Present Illness Kelly Roberson is a 60 y.o. female with medical history significant of Acute Myeloid leukemia having been admitted to [facility] for the last 60 days. Was discharged home for 24h before she presented to oncology for further evaluation for ongoing weakness. She declined admission to hospital at that point but presented again to oncology for ongoing weakness. Reports fall at home. Comorbid conditions include - HTN, Anxiety, Hepatitis B/C, IBS, RLS.    PT Comments    Assisted OOB to amb to bathroom then back to bed required increased time.  General bed mobility comments: required increased time and effort due to weakness.  Assist B LE (pain/edema)General transfer comment: required increased time and effort due to general weakness and instability.   Follow Up Recommendations  SNF     Equipment Recommendations  None recommended by PT    Recommendations for Other Services       Precautions / Restrictions Precautions Precautions: Fall Precaution Comments: hx of falls    Mobility  Bed Mobility Overal bed mobility: Needs Assistance Bed Mobility: Supine to Sit;Sit to Supine     Supine to sit: Min assist Sit to supine: Min assist   General bed mobility comments: required increased time and effort due to weakness.  Assist B LE (pain/edema)    Transfers Overall transfer level: Needs assistance Equipment used: Rolling walker (2 wheeled) Transfers: Sit to/from [facility] Sit to Stand: Min guard Stand pivot transfers: Min guard;Min assist       General transfer comment: required increased time and effort due to general weakness and instability.  Ambulation/Gait Ambulation/Gait assistance: Min assist Gait Distance (Feet): 22 Feet Assistive device: Rolling walker (2 wheeled) Gait Pattern/deviations: Step-through  pattern;Decreased stride length Gait velocity: decreased   General Gait Details: assisted with amb to and from bathroom only due to weakness and B LE pain/edema   Stairs             Wheelchair Mobility    Modified Rankin (Stroke Patients Only)       Balance                                            Cognition Arousal/Alertness: Awake/alert Behavior During Therapy: Anxious;WFL for tasks assessed/performed Overall Cognitive Status: Within Functional Limits for tasks assessed                                 General Comments: AxO x 3 pleasant but received some "bad news" from her Onogologist      Exercises      General Comments        Pertinent Vitals/Pain Pain Assessment: 0-10 Pain Score: 6  Pain Location: B feet/ankles Pain Descriptors / Indicators: Burning;Pressure;Grimacing Pain Intervention(s): Monitored during session;Repositioned    Home Living                      Prior Function            PT Goals (current goals can now be found in the care plan section) Progress towards PT goals: Progressing toward goals    Frequency           PT Plan Current plan remains appropriate  Co-evaluation              AM-PAC PT "6 Clicks" Mobility   Outcome Measure  Help needed turning from your back to your side while in a flat bed without using bedrails?: A Little Help needed moving from lying on your back to sitting on the side of a flat bed without using bedrails?: A Little Help needed moving to and from a bed to a chair (including a wheelchair)?: A Little Help needed standing up from a chair using your arms (e.g., wheelchair or bedside chair)?: A Little Help needed to walk in hospital room?: A Little Help needed climbing 3-5 steps with a railing? : A Lot 6 Click Score: 17    End of Session Equipment Utilized During Treatment: Gait belt Activity Tolerance: Patient limited by pain Patient left: in  bed;with call bell/phone within reach;with bed alarm set Nurse Communication: Mobility status PT Visit Diagnosis: Muscle weakness (generalized) (M62.81);Difficulty in walking, not elsewhere classified (R26.2);Pain;History of falling (Z91.81)     Time: 4174-0814 PT Time Calculation (min) (ACUTE ONLY): 28 min  Charges:  $Gait Training: 8-22 mins $Therapeutic Activity: 8-22 mins                     Rica Koyanagi  PTA Acute  Rehabilitation Services Pager      [phone] Office      [phone]

## 2020-04-24 NOTE — Progress Notes (Signed)
   04/24/20 0703  Provider Notification  Provider Name/Title Raiford Noble  Date Provider Notified 04/24/20  Time Provider Notified 0703  Notification Type Page  Notification Reason Critical result  Test performed and critical result Absolute Neutrophil 0.1  Date Critical Result Received 04/24/20  Time Critical Result Received 0700  Provider response Other (Comment) (No reply; [facility] RN aware of critical; upon chart review; critical result is the same as yesterday's lab work)

## 2020-04-24 NOTE — Progress Notes (Signed)
OT Cancellation Note  Patient Details Name: Rasheida Broden MRN: [id number] DOB: [birth year]   Cancelled Treatment:    Reason Eval/Treat Not Completed: Other (comment) Upon arrival to patient's room patient very tearful and RN present. Request to try back later. Will re-attempt as schedule permits.  Delbert Phenix OT OT pager: [phone]   Rosemary Holms 04/24/2020, 12:47 PM

## 2020-04-24 NOTE — TOC Progression Note (Addendum)
Transition of Care [facility]) - Progression Note    Patient Details  Name: Kelly Roberson MRN: [id number] Date of Birth: Jan 09, 1960  [facility] CM/SW Contact  Ross Ludwig, [place] Phone Number: 04/24/2020, 3:17 PM  Clinical Narrative:     CSW provided bed offers to patient, she will review and make a decision and contact CSW about her choice.  CSW informed her that insurance Josem Kaufmann will have to be started and takes few days.  CSW attempted to contact Medicaid case manager, left a message on voice mail.  CSW to continue to follow patient's progress throughout discharge planning.   Expected Discharge Plan: [place] Barriers to Discharge: Continued Medical Work up  Expected Discharge Plan and Services Expected Discharge Plan: [place] In-house Referral: Clinical Social Work   Post Acute Care Choice: [place] Living arrangements for the past 2 months: [place]: PT,Nurse's Aide [facility] Agency: [facility] [place] Date [place]: 04/22/20 Time [place]: 1130 Representative spoke with at [place]: [place] ([place]) Interventions    Readmission Risk Interventions No flowsheet data found.

## 2020-04-24 NOTE — Progress Notes (Signed)
[facility]  Date of Admission:  04/20/2020           Reason for visit: Follow up on lower extremity rash  Current antibiotics: Acyclovir prophylaxis Posaconazole prophylaxis Levaquin prophylaxis  Previous antibiotics: None  ASSESSMENT & PLAN:    #Distal lower extremity rash #Proximal lower extremity and abdominal rash #AML #Pancytopenia with severe neutropenia  Unclear etiology of her lower extremity lesions, however, appears to have possibly 2 separate processes going on.  Regarding her distal lower extremity lesions that are more violaceous and macular in appearance, discussed with dermatology informally and, given her previous negative work-up at [facility] within the last 2 weeks, this is likely capillaritis with microhemorrhage due to severe thrombocytopenia and less likely Sweet syndrome, leukemia cutis, or other atypical infection.  The fact that these lesions are flat also supports this possible etiology.  She does have an area on her left lateral calf with some necrosis that will need to be closely monitored as this could potentially represent bullous sweets or atypical fungal infection but appears stable today.  Will continue to monitor these areas for now and see if there is improvement with correction of her thrombocytopenia.  Regarding her more proximal lower extremity and abdominal lesions with an annular and possibly scaly component it is possible that this is a tinea infection, however, this would be unusual as she is already on posaconazole therapy.  Other consideration is some sort of irritant dermatitis related to the mesh underwear that she had on previously.  Will add topical terbinafine for now and trial this for approximately 2 weeks to see if it improves her symptoms while also continuing posaconazole that is prescribed for fungal prophylaxis in the setting of AML.  At the end of the day, if these lesions progress or do  not heal she will likely require biopsy with AFB, fungal, bacterial cultures and pathology to help secure a diagnosis.    Principal Problem:   AML (acute myeloblastic leukemia) (HCC) Active Problems:   Neutropenia (HCC)   Thrombocytopenia (HCC)   Symptomatic anemia   Malnutrition of moderate degree   Rash and nonspecific skin eruption    MEDICATIONS:    Scheduled Meds: . acyclovir  400 mg Oral BID  . cholecalciferol  2,000 Units Oral Daily  . escitalopram  5 mg Oral Daily  . feeding supplement  237 mL Oral TID BM  . levofloxacin  500 mg Oral Daily  . multivitamin with minerals  1 tablet Oral Daily  . oxyCODONE  20 mg Oral Q12H  . pantoprazole  40 mg Oral Daily  . posaconazole  300 mg Oral Daily  . pregabalin  150 mg Oral BID  . terbinafine   Topical BID  . traZODone  50 mg Oral QHS   Continuous Infusions: . potassium chloride 10 mEq (04/24/20 1308)   PRN Meds:.ALPRAZolam, ondansetron (ZOFRAN) IV  SUBJECTIVE:   24 hour events:  No acute events Remains afebrile, remains neutropenic, remains pancytopenic Distal lower extremity lesions appear stable Reports development of more proximal annular lesions that have some pleuritic component to it No other new complaints   Review of Systems  All other systems reviewed and are negative.     OBJECTIVE:   Blood pressure (!) 142/67, pulse 81, temperature 98.2 F (36.8 C), temperature source Oral, resp. rate 18, height 5' 7.99" (1.727 m), weight 84 kg, SpO2 96 %. Body mass index is 28.16 kg/m.  Physical Exam Constitutional:  General: She is not in acute distress.    Appearance: Normal appearance.  Musculoskeletal:     Right lower leg: Edema present.     Left lower leg: Edema present.  Skin:    General: Skin is warm and dry.     Findings: Bruising, lesion and rash present.  Neurological:     General: No focal deficit present.     Mental Status: She is alert and oriented to person, place, and time.   Psychiatric:        Mood and Affect: Mood normal.        Behavior: Behavior normal.          Lab Results: Lab Results  Component Value Date   WBC 4.4 04/24/2020   HGB 7.9 (L) 04/24/2020   HCT 24.8 (L) 04/24/2020   MCV 95.8 04/24/2020   PLT 28 (LL) 04/24/2020    Lab Results  Component Value Date   NA 143 04/24/2020   K 3.5 04/24/2020   CO2 29 04/24/2020   GLUCOSE 127 (H) 04/24/2020   BUN <5 (L) 04/24/2020   CREATININE 0.79 04/24/2020   CALCIUM 8.7 (L) 04/24/2020   GFRNONAA >60 04/24/2020   GFRAA >60 10/04/2019    Lab Results  Component Value Date   ALT 14 04/24/2020   AST 39 04/24/2020   ALKPHOS 150 (H) 04/24/2020   BILITOT 0.9 04/24/2020    No results found for: CRP     Component Value Date/Time   ESRSEDRATE 9 01/10/2020 0935     I have reviewed the micro and lab results in Epic.  Imaging: US PELVIC COMPLETE WITH TRANSVAGINAL  Result Date: 04/23/2020 CLINICAL DATA:  Vaginal bleeding EXAM: TRANSABDOMINAL AND TRANSVAGINAL ULTRASOUND OF PELVIS TECHNIQUE: Both transabdominal and transvaginal ultrasound examinations of the pelvis were performed. Transabdominal technique was performed for global imaging of the pelvis including uterus, ovaries, adnexal regions, and pelvic cul-de-sac. It was necessary to proceed with endovaginal exam following the transabdominal exam to attempt to visualize the uterus endometrium ovaries. COMPARISON:  CT 09/09/2019 FINDINGS: Uterus Transvaginal images or markedly limited by patient pain tolerance and shadowing. Measurements: 8.9 x 4.5 x 5.8 cm = volume: 114.7 mL. Diffuse echogenic foci throughout the myometrium. Endometrium Thickness: 4.4 mm.  No focal abnormality visualized. Right ovary Measurements: 2.5 x 2.1 x 2 cm = volume: 5.4 mL. Normal appearance/no adnexal mass. Left ovary Measurements: 2.6 x 2.1 x 2.2 cm = volume: 6.3 mL. Normal appearance/no adnexal mass. Other findings No abnormal free fluid. IMPRESSION: 1. Limited study.  Transvaginal images of limited utility due to pain. 2. Post menopausal endometrial thickness grossly within normal limits. 3. Heterogeneous myometrial echotexture with numerous echogenic foci indeterminate for microcalcifications from prior inflammation or infection versus vascular calcification versus echogenic foci associated with adenomyosis though no adequate transvaginal images are available. Depending upon clinical course, evaluation with pelvic MRI could be considered. Electronically Signed   By: Donavan Foil M.D.   On: 04/23/2020 19:01     Imaging independently reviewed in Epic.    [facility] [phone] pager 04/24/2020, 1:28 PM  I spent greater than 35 minutes with the patient including greater than 50% of time in face to face counsel of the patient and in coordination of their care.

## 2020-04-24 NOTE — Progress Notes (Addendum)
PROGRESS NOTE    [facility]  [id number] DOB: 02/12/1960 DOA: 04/20/2020 PCP: Kelly Cowman, MD   Brief Narrative:  HPI per [facility] on 04/20/20 [facility] a 60 y.o.femalewith medical history significant ofAcute Myeloid leukemia having been admitted to [facility] for the last 60 days. Was discharged home for 24h before she presented to oncology for further evaluation for ongoing weakness. She declined admission to [facility] at that point but now presents again to oncology for ongoing weakness who is requesting admission to the [facility]. Comorbid conditions include - HTN, Anxiety, Hepatitis B/C, IBS, RLS.   Timeline of events per oncology follow up: 1) 04/30/2019: patient presented to [facility] with progressive weakness. Found to have pancytopenia and peripheral blasts with intracellular inclusions.  2) 05/01/2019: transferred to [facility] 3) 05/04/2019: patient started induction therapy for AML with 7+3 and gemtuzumab on Day 4. 4) 05/16/2019: repeat Bmbx shows no residual leukemia 5) 06/17/2019: patient returned for Cycle 1 Consolidation with cytarabine and gemtuzumab 6) 06/24/2019: presents to [facility] with [facility] for co-managed care. 7) 07/15/2019: Cycle 2 of consolidation therapy  8) 07/30/2019: admitted for BRBPR/ Infectious colitis.  9) 08/26/2019: start of Cycle 3 of consolidation therapy at [facility]  10) 09/09/2019: admitted withneutropenicfever and BRBPR 11) 10/23/2019: evaluated by [facility]. After discussion with him decided against oral azacitidine therapy 12) 02/17/2020-04/15/2020: admitted to [facility]. Treated with Decitabine/Venetoclax x 2 cycles 13) Presented to [facility] 04/16/20 for weakness/fall/ambulatory dysfunction. Refused admission and returned home despite discussion with ED and Oncology physician. 14) Presents to oncology 04/20/20 in the office for ongoing weakness and increased falls- admitted directly to [facility] for PT evaluation and  platelet transfusion to be evaluated for placement. 15) 04/23/20 states she has Vaginal Bleeding now. Will transfuse 1 pack of platelets and asked ID to evaluate lesions on her legs as well. Transvaginal U/S ordered   **Interim History  PT evaluating and recommending CIR but she had Critically low WBC and Platelets which were contraindications to aggressive Rehab. CIR admission denied and recommendation was for SNF.  Her electrolytes are being repleted.  She developed a rash on her mid thighs and abdomen and then has lower extremity violaceous color flat lesions that appear to be microhemorrhages from low platelet count.  ID was consulted given that the proximal lesions look like ringworm and they have added topical terbinafine.  Yesterday she complained about having some vaginal bleeding however this may have been secondary to her mesh panties causing some irritation and some mild bleeding.  Platelet count has been improved with another unit and her vaginal bleeding is resolved.  Assessment & Plan:   Principal Problem:   AML (acute myeloblastic leukemia) ([facility]) Active Problems:   Neutropenia ([facility])   Thrombocytopenia ([facility])   Symptomatic anemia   Malnutrition of moderate degree   Rash and nonspecific skin eruption  Ambulatory dysfunction, mechanical fall - Patient was taken to the emergency room on 04/16/2020 for a fall from standing with trauma to the head - negative workup at that time. - This occurred 1 day after discharge from [facility]. Her admission there lasted approximately 2 months. - Patient declined to be admitted to the [facility] at that time- agreeable to admission. -PT to evaluate for possible needs/placement and they are recommending SNF and will need assistance with placement and she has bed offers  Acute on chronic thrombocytopenia; chronic pancytopenia Secondary to below -Secondary to below -Transfuse 1 pool platelets at admission and will get another  now  given Vaginal Bleeding  -Goal hemoglobin 8, goal platelet 20 now that she has evidence of bleeding  -Her WBC was 2.5, Hgb/Hct is 8.3/26.3 and platelet count is 13; ANC is 0.2 -Now her WBC is 4.4, hemoglobin/hematocrit is 7.9/24.8, platelet count is 28.  ANC is 0.1 now -Oncology recommending no GCSF  -Continue to monitor and trend and repeat CBC in a.m.   Hypokalemia, improved   -Unclear etiology, likely poor p.o. intake malnutrition, continue to advance diet as tolerated -K+ this AM was 3.9 and Mag level was 2.0 -Questionable episodes of nausea and vomiting per the patient prior to admission -Replete via IV given patient's refusal to take po Pills and Liquid formulations -Currently replating with IV KCl 10 mill colons every 2 hours for 10 doses -Mag level improved  -Continue To monitor and replete as necessary -Repeat CMP in the AM   Hypomagnesemia, improved  -Patient magnesium level was 1.4 and improved to 2.0 but now was 1.6 -Replete with IV mag sulfate 4 g again -Continue to monitor replete as necessary -Repeat magnesium level in a.m.  Myeloid Leukemia, Favorable Risk ([facility]) -Continue f/u at [facility] with [facility]. -Follows locally with Dr [facility] -twice weekly blood draws with q 2 week [facility] visits. -Currently on posaconazole, acyclovir and ID has resumed her levofloxacin -Appreciate [facility] evaluation and Recc's; He feels that she may not have fully been treated as she has blasts floating around in her blood   Vaginal Bleeding, ruled out -Initially thought to be vaginal bleeding but now likely secondary to irritation from mesh panties -Also will transfuse 1 pack of Platelets to try to get Platelet Goal >20 -[facility] to follow up on Transvaginal U/S and make additional Recc's -Transvaginal ultrasound showed "Limited study. Transvaginal images of limited utility due to pain. Post menopausal endometrial thickness grossly within normal limits.  Heterogeneous myometrial echotexture with numerous echogenic foci indeterminate for microcalcifications from prior inflammation or infection versus vascular calcification versus echogenic foci associated with adenomyosis though no adequate  transvaginal images are available. Depending upon clinical course, evaluation with pelvic MRI could be  Considered." -Now that this is resolved she will need outpatient GYN follow-up  ?Tinea Corporis vs other Rash and other violaceous lesions on the lower extremities -Unclear Rash on the LE that has proximal lesions thatare circular appearing erythematous plaques but she also has violaceous lesions that are flat on the lower extremities -Already on Antifungals with posaconazole but have reached out to pharmacy to see if she needs a topical formulation for ringworm -I asked ID to see the patient and they have started the patient on topical terbinafine -Had lesions Biopsied at [facility] and was notable for spongiosis with hemorrhage but has now had progression of these new lesions on the LE with continued Neutropenia -ID feels her DDx includes Inflammatory Dermatitis with 2/2 Hemorrhage vs Infectious Etiology (Fungal, Bacterial, Viral) in the setting of Neutropenia vs. Sweet Syndrome vs. Leukemia Cutis -ID discussing with Dermatology and pleased to have an unclear etiology of her lower extremity lesions but it could be that she has 2 different things going on.  Dr. Juleen China informally discussed with dermatology and they feel that the lower lesions could be capillaritis with micro hemorrhaging due to severe thrombocytopenia and less likely Sweet syndrome, leukemic cutis or atypical infection given that the lesions are flat.  She does have a left lateral calf area with some necrosis that needs to be closely monitored that represents bullous sweets or  atypical fungal infection but will continue to monitor see if this improves his with the improvement in her thrombocytopenia -The  proximal lesions are more consistent with a tinea infection or an irritant dermatitis and they are going to add topical terbinafine for 2 weeks -If lesions do not improve or progress she will require another biopsy with AFB, fungal, bacterial cultures and pathology to help determine etiology -WOC nurse consulted to remove stitch from prior Bx site  DVT prophylaxis: SCDs given thrombocytopenia Code Status: FULL CODE  Family Communication: No family present at bedside  Disposition Plan: Anticipating D/C to SNF when medically stable and cleared by ID and Oncology; we are recommending SNF and likely will be discharging next 24 to 48 hours  Status is: Inpatient  Remains inpatient appropriate because:Unsafe d/c plan, IV treatments appropriate due to intensity of illness or inability to take PO and Inpatient level of care appropriate due to severity of illness   Dispo:  Patient From: Home  Planned Disposition: [place]  Medically stable for discharge: Yes     Consultants:   Medical Oncology  Infectious Diseases  Spoke to OB-GYN Dr. Arlina [facility]  Procedures: None  Antimicrobials:  Anti-infectives (From admission, onward)   Start     Dose/Rate Route Frequency Ordered Stop   04/23/20 1415  levofloxacin (LEVAQUIN) tablet 500 mg        500 mg Oral Daily 04/23/20 1318     04/21/20 1000  acyclovir (ZOVIRAX) tablet 400 mg        400 mg Oral 2 times daily 04/21/20 0750     04/21/20 1000  posaconazole (NOXAFIL) delayed-release tablet 300 mg        300 mg Oral Daily 04/21/20 0750          Subjective: Seen and examined at bedside and still feels weak.  Electrolytes are still we will also replete her magnesium.  She thinks that the lesions on her legs are little bit better specifically the lower extremity wounds but the ones in the proximal extremity are about the same.  Thinks that she had vaginal bleeding in the setting of her mesh panty irritation and felt that it was too  tight on her waist.  She denies any other concerns or complaints at this time.  Objective: Vitals:   04/23/20 1803 04/23/20 2001 04/24/20 0455 04/24/20 1345  BP: (!) 153/70 (!) 145/71 (!) 142/67 114/70  Pulse: 81 81 81 77  Resp: 16 17 18 18   Temp: 98.6 F (37 C) 98 F (36.7 C) 98.2 F (36.8 C) 98 F (36.7 C)  TempSrc: Oral Oral Oral Oral  SpO2: 95% 96% 96% 93%  Weight:      Height:        Intake/Output Summary (Last 24 hours) at 04/24/2020 1450 Last data filed at 04/23/2020 1805 Gross per 24 hour  Intake 428.67 ml  Output --  Net 428.67 ml   Filed Weights   04/20/20 1500  Weight: 84 kg   Examination: Physical Exam:  Constitutional: WN/WD chronically ill-appearing Caucasian female currently in no acute distress appears calm but fatigued  Eyes: Has a slightly red eye on the right ENMT: External Ears, Nose appear normal. Grossly normal hearing.  Neck: Appears normal, supple, no cervical masses, normal ROM, no appreciable thyromegaly; no JVD Respiratory: Diminished to auscultation bilaterally, no wheezing, rales, rhonchi or crackles. Normal respiratory effort and patient is not tachypenic. No accessory muscle use.  Unlabored breathing Cardiovascular: RRR, no murmurs / rubs /  gallops. S1 and S2 auscultated.  Is 1+ lower extremity Abdomen: Soft, non-tender, non-distended. Bowel sounds positive.  GU: Deferred. Musculoskeletal: No clubbing / cyanosis of digits/nails. No joint deformity upper and lower extremities.  Skin: Has an erythematous raised target type rash in the lower extremity proximally the biopsy site on the right leg suture in it.  Lower extremity has some violaceous and flat appearing lesions no induration; Warm and dry.  Neurologic: CN 2-12 grossly intact with no focal deficits. Romberg sign and cerebellar reflexes not assessed.  Psychiatric: Normal judgment and insight. Alert and oriented x 3. Normal mood and appropriate affect.   Data Reviewed: I have personally  reviewed following labs and imaging studies  CBC: Recent Labs  Lab 04/20/20 1117 04/21/20 0606 04/22/20 0532 04/23/20 0752 04/24/20 0542  WBC 1.0* 1.0* 1.5* 2.5* 4.4  NEUTROABS 0.1*  --   --  0.2* 0.1*  HGB 8.1* 7.2* 8.0* 8.3* 7.9*  HCT 23.3* 21.3* 23.9* 26.3* 24.8*  MCV 89.6 92.6 93.0 96.7 95.8  PLT 9* 15* 12* 13* 28*   Basic Metabolic Panel: Recent Labs  Lab 04/20/20 1117 04/21/20 0606 04/22/20 0532 04/23/20 0752 04/24/20 0542  NA 142 140 140 143 143  K 2.9* 2.4* 2.6* 3.9 3.5  CL 101 104 102 105 105  CO2 26 29 28 26 29   GLUCOSE 104* 118* 100* 100* 127*  BUN <4* <5* <5* <5* <5*  CREATININE 0.63 0.53 0.55 0.73 0.79  CALCIUM 8.3* 7.8* 8.0* 8.7* 8.7*  MG  --   --  1.4* 2.0 1.6*  PHOS  --   --  3.5 2.6 2.9   GFR: Estimated Creatinine Clearance: 83.8 mL/min (by C-G formula based on SCr of 0.79 mg/dL). Liver Function Tests: Recent Labs  Lab 04/20/20 1117 04/21/20 0606 04/23/20 0752 04/24/20 0542  AST 25 21 33 39  ALT 12 12 11 14   ALKPHOS 186* 137* 142* 150*  BILITOT 1.0 0.9 1.0 0.9  PROT 5.6* 4.9* 5.3* 5.2*  ALBUMIN 3.0* 2.7* 3.0* 2.9*   No results for input(s): LIPASE, AMYLASE in the last 168 hours. No results for input(s): AMMONIA in the last 168 hours. Coagulation Profile: Recent Labs  Lab 04/21/20 0606  INR 1.3*   Cardiac Enzymes: No results for input(s): CKTOTAL, CKMB, CKMBINDEX, TROPONINI in the last 168 hours. BNP (last 3 results) No results for input(s): PROBNP in the last 8760 hours. HbA1C: No results for input(s): HGBA1C in the last 72 hours. CBG: No results for input(s): GLUCAP in the last 168 hours. Lipid Profile: No results for input(s): CHOL, HDL, LDLCALC, TRIG, CHOLHDL, LDLDIRECT in the last 72 hours. Thyroid Function Tests: No results for input(s): TSH, T4TOTAL, FREET4, T3FREE, THYROIDAB in the last 72 hours. Anemia Panel: No results for input(s): VITAMINB12, FOLATE, FERRITIN, TIBC, IRON, RETICCTPCT in the last 72 hours. Sepsis  Labs: No results for input(s): PROCALCITON, LATICACIDVEN in the last 168 hours.  Recent Results (from the past 240 hour(s))  SARS CORONAVIRUS 2 (TAT 6-24 HRS) Nasopharyngeal Nasopharyngeal Swab     Status: None   Collection Time: 04/21/20  1:37 AM   Specimen: Nasopharyngeal Swab  Result Value Ref Range Status   SARS Coronavirus 2 NEGATIVE NEGATIVE Final    Comment: (NOTE) SARS-CoV-2 target nucleic acids are NOT DETECTED.  The SARS-CoV-2 RNA is generally detectable in upper and lower respiratory specimens during the acute phase of infection. Negative results do not preclude SARS-CoV-2 infection, do not rule out co-infections with other pathogens, and should not be  used as the sole basis for treatment or other patient management decisions. Negative results must be combined with clinical observations, patient history, and epidemiological information. The expected result is Negative.  Fact Sheet for Patients: SugarRoll.be  Fact Sheet for Healthcare Providers: https://www.woods-mathews.com/  This test is not yet approved or cleared by the [place] FDA and  has been authorized for detection and/or diagnosis of SARS-CoV-2 by FDA under an Emergency Use Authorization (EUA). This EUA will remain  in effect (meaning this test can be used) for the duration of the COVID-19 declaration under Se ction 564(b)(1) of the Act, 21 U.S.C. section 360bbb-3(b)(1), unless the authorization is terminated or revoked sooner.  Performed at [facility], [place] [address] [place], [place] [address]     RN Pressure Injury Documentation:     Estimated body mass index is 28.16 kg/m as calculated from the following:   Height as of this encounter: 5' 7.99" (1.727 m).   Weight as of this encounter: 84 kg.  Malnutrition Type:  Nutrition Problem: Moderate Malnutrition Etiology: chronic illness,cancer and cancer related treatments  Malnutrition  Characteristics:  Signs/Symptoms: mild fat depletion,mild muscle depletion  Nutrition Interventions:  Interventions: Ensure Enlive (each supplement provides 350kcal and 20 grams of protein),MVI   Radiology Studies: US PELVIC COMPLETE WITH TRANSVAGINAL  Result Date: 04/23/2020 CLINICAL DATA:  Vaginal bleeding EXAM: TRANSABDOMINAL AND TRANSVAGINAL ULTRASOUND OF PELVIS TECHNIQUE: Both transabdominal and transvaginal ultrasound examinations of the pelvis were performed. Transabdominal technique was performed for global imaging of the pelvis including uterus, ovaries, adnexal regions, and pelvic cul-de-sac. It was necessary to proceed with endovaginal exam following the transabdominal exam to attempt to visualize the uterus endometrium ovaries. COMPARISON:  CT 09/09/2019 FINDINGS: Uterus Transvaginal images or markedly limited by patient pain tolerance and shadowing. Measurements: 8.9 x 4.5 x 5.8 cm = volume: 114.7 mL. Diffuse echogenic foci throughout the myometrium. Endometrium Thickness: 4.4 mm.  No focal abnormality visualized. Right ovary Measurements: 2.5 x 2.1 x 2 cm = volume: 5.4 mL. Normal appearance/no adnexal mass. Left ovary Measurements: 2.6 x 2.1 x 2.2 cm = volume: 6.3 mL. Normal appearance/no adnexal mass. Other findings No abnormal free fluid. IMPRESSION: 1. Limited study. Transvaginal images of limited utility due to pain. 2. Post menopausal endometrial thickness grossly within normal limits. 3. Heterogeneous myometrial echotexture with numerous echogenic foci indeterminate for microcalcifications from prior inflammation or infection versus vascular calcification versus echogenic foci associated with adenomyosis though no adequate transvaginal images are available. Depending upon clinical course, evaluation with pelvic MRI could be considered. Electronically Signed   By: Donavan Foil M.D.   On: 04/23/2020 19:01    Scheduled Meds: . acyclovir  400 mg Oral BID  . cholecalciferol  2,000  Units Oral Daily  . escitalopram  5 mg Oral Daily  . feeding supplement  237 mL Oral TID BM  . levofloxacin  500 mg Oral Daily  . multivitamin with minerals  1 tablet Oral Daily  . oxyCODONE  20 mg Oral Q12H  . pantoprazole  40 mg Oral Daily  . posaconazole  300 mg Oral Daily  . pregabalin  150 mg Oral BID  . terbinafine   Topical BID  . traZODone  50 mg Oral QHS   Continuous Infusions: . potassium chloride 10 mEq (04/24/20 1443)    LOS: 4 days   Kerney Elbe, DO [facility] PAGER is on [place]  If 7PM-7AM, please contact night-coverage [email or web address]

## 2020-04-25 DIAGNOSIS — E440 Moderate protein-calorie malnutrition: Secondary | ICD-10-CM | POA: Diagnosis not present

## 2020-04-25 DIAGNOSIS — T451X5A Adverse effect of antineoplastic and immunosuppressive drugs, initial encounter: Secondary | ICD-10-CM | POA: Diagnosis not present

## 2020-04-25 DIAGNOSIS — D701 Agranulocytosis secondary to cancer chemotherapy: Secondary | ICD-10-CM | POA: Diagnosis not present

## 2020-04-25 DIAGNOSIS — C9202 Acute myeloblastic leukemia, in relapse: Secondary | ICD-10-CM | POA: Diagnosis not present

## 2020-04-25 DIAGNOSIS — R21 Rash and other nonspecific skin eruption: Secondary | ICD-10-CM | POA: Diagnosis not present

## 2020-04-25 DIAGNOSIS — D649 Anemia, unspecified: Secondary | ICD-10-CM | POA: Diagnosis not present

## 2020-04-25 DIAGNOSIS — R531 Weakness: Secondary | ICD-10-CM | POA: Diagnosis not present

## 2020-04-25 DIAGNOSIS — D61818 Other pancytopenia: Secondary | ICD-10-CM | POA: Diagnosis not present

## 2020-04-25 LAB — COMPREHENSIVE METABOLIC PANEL
ALT: 15 U/L (ref 0–44)
AST: 48 U/L — ABNORMAL HIGH (ref 15–41)
Albumin: 2.9 g/dL — ABNORMAL LOW (ref 3.5–5.0)
Alkaline Phosphatase: 159 U/L — ABNORMAL HIGH (ref 38–126)
Anion gap: 12 (ref 5–15)
BUN: <5 mg/dL — ABNORMAL LOW (ref 8–23)
CO2: 26 mmol/L (ref 22–32)
Calcium: 8.9 mg/dL (ref 8.9–10.3)
Chloride: 102 mmol/L (ref 98–111)
Creatinine, Ser: 0.72 mg/dL (ref 0.44–1.00)
GFR, Estimated: >60 mL/min (ref >60)
Glucose, Bld: 140 mg/dL — ABNORMAL HIGH (ref 70–99)
Potassium: 3.6 mmol/L (ref 3.5–5.1)
Sodium: 140 mmol/L (ref 135–145)
Total Bilirubin: 0.8 mg/dL (ref 0.3–1.2)
Total Protein: 5.4 g/dL — ABNORMAL LOW (ref 6.5–8.1)

## 2020-04-25 LAB — CBC WITH DIFFERENTIAL/PLATELET
Abs Immature Granulocytes: 0 10*3/uL (ref 0.00–0.07)
Basophils Absolute: 0 10*3/uL (ref 0.0–0.1)
Basophils Relative: 0 %
Blasts: 42 %
Eosinophils Absolute: 0 10*3/uL (ref 0.0–0.5)
Eosinophils Relative: 0 %
HCT: 25.4 % — ABNORMAL LOW (ref 36.0–46.0)
Hemoglobin: 8.3 g/dL — ABNORMAL LOW (ref 12.0–15.0)
Lymphocytes Relative: 52 %
Lymphs Abs: 2.7 10*3/uL (ref 0.7–4.0)
MCH: 31.2 pg (ref 26.0–34.0)
MCHC: 32.7 g/dL (ref 30.0–36.0)
MCV: 95.5 fL (ref 80.0–100.0)
Monocytes Absolute: 0.1 10*3/uL (ref 0.1–1.0)
Monocytes Relative: 1 %
Neutro Abs: 0.3 10*3/uL — CL (ref 1.7–7.7)
Neutrophils Relative %: 5 %
Platelets: 20 10*3/uL — CL (ref 150–400)
RBC: 2.66 MIL/uL — ABNORMAL LOW (ref 3.87–5.11)
RDW: 16.1 % — ABNORMAL HIGH (ref 11.5–15.5)
WBC: 5.1 10*3/uL (ref 4.0–10.5)
nRBC: 0.4 % — ABNORMAL HIGH (ref 0.0–0.2)

## 2020-04-25 LAB — PHOSPHORUS: Phosphorus: 2.7 mg/dL (ref 2.5–4.6)

## 2020-04-25 LAB — MAGNESIUM: Magnesium: 1.8 mg/dL (ref 1.7–2.4)

## 2020-04-25 MED ORDER — MAGNESIUM SULFATE 2 GM/50ML IV SOLN
2.0000 g | Freq: Once | INTRAVENOUS | Status: AC
  Administered 2020-04-25: 2 g via INTRAVENOUS
  Filled 2020-04-25: qty 50

## 2020-04-25 NOTE — Progress Notes (Signed)
Occupational Therapy Treatment Patient Details Name: [facility] MRN: [id number] DOB: 23-Jun-1960 Today's Date: 04/25/2020    History of present illness Kelly [facility] is a 60 y.o. female with medical history significant of Acute Myeloid leukemia having been admitted to [facility] for the last 60 days. Was discharged home for 24h before she presented to oncology for further evaluation for ongoing weakness. She declined admission to hospital at that point but presented again to oncology for ongoing weakness. Reports fall at home. Comorbid conditions include - HTN, Anxiety, Hepatitis B/C, IBS, RLS.   OT comments  Patient presents with flat affect, reports her thinking "is messed up. I have too much on my mind." RN reporting patient forgetful and patient does not appear to remember therapist from earlier in the weak. Patient, understandably, focused on her medical condition and prognosis and morose during treatment. Patient min assist to rise from elevated bed height - unable to stand from normal bed height. Patient min guard to SBA to ambulate approx 50 feet in hallway with RW. Reports pain in feet more limiting than fatigue. Therapist discussed POC with patient - patient reports wanting to go home and thinking she doesn't need 24/7 assist. Patient appears to lack awareness of her deficits and therapist concerned about medication management as patient forgetful. Continue to recommend SNF at discharge. If she refuses SNF she needs 24/7 assistance - at least initially.   Follow Up Recommendations  SNF    Equipment Recommendations  None recommended by OT    Recommendations for Other Services      Precautions / Restrictions Precautions Precautions: Fall Precaution Comments: hx of falls Restrictions Weight Bearing Restrictions: No       Mobility Bed Mobility Overal bed mobility: Modified Independent             General bed mobility comments: seated at side of bed when therapist entered the  room.    Transfers Overall transfer level: Needs assistance Equipment used: Rolling walker (2 wheeled) Transfers: Sit to/from Stand Sit to Stand: Min assist         General transfer comment: Min assist to stand from elevated bed height. Min guard/SBA to ambulate approx 50 feet with RW. Limited more by complaints of foot pain than fatigue.    Balance Overall balance assessment: Needs assistance Sitting-balance support: No upper extremity supported Sitting balance-Leahy Scale: Good     Standing balance support: During functional activity Standing balance-Leahy Scale: Fair                             ADL either performed or assessed with clinical judgement   ADL                                               Vision Patient Visual Report: No change from baseline     Perception     Praxis      Cognition Arousal/Alertness: Awake/alert Behavior During Therapy: Flat affect Overall Cognitive Status: No family/caregiver present to determine baseline cognitive functioning                                 General Comments: Seems to be forgetful. Doesn't remember therapist from earlier in the week.        Exercises  Shoulder Instructions       General Comments      Pertinent Vitals/ Pain       Pain Assessment: Faces Faces Pain Scale: Hurts little more Pain Location: feet Pain Descriptors / Indicators: Burning;Pressure;Grimacing Pain Intervention(s): Monitored during session  Home Living                                          Prior Functioning/Environment              Frequency  Min 2X/week        Progress Toward Goals  OT Goals(current goals can now be found in the care plan section)  Progress towards OT goals: Progressing toward goals  Acute Rehab OT Goals Patient Stated Goal: tto go home OT Goal Formulation: With patient Time For Goal Achievement: 05/05/20 Potential to  Achieve Goals: Good  Plan Discharge plan remains appropriate    Co-evaluation                 AM-PAC OT "6 Clicks" Daily Activity     Outcome Measure   Help from another person eating meals?: None Help from another person taking care of personal grooming?: A Little Help from another person toileting, which includes using toliet, bedpan, or urinal?: A Little Help from another person bathing (including washing, rinsing, drying)?: A Little Help from another person to put on and taking off regular upper body clothing?: None Help from another person to put on and taking off regular lower body clothing?: A Little 6 Click Score: 20    End of Session Equipment Utilized During Treatment: Rolling walker  OT Visit Diagnosis: Unsteadiness on feet (R26.81);History of falling (Z91.81);Muscle weakness (generalized) (M62.81)   Activity Tolerance Patient tolerated treatment well   Patient Left in bed;with call bell/phone within reach;with bed alarm set   Nurse Communication Mobility status        Time: 7412-8786 OT Time Calculation (min): 22 min  Charges: OT General Charges $OT Visit: 1 Visit OT Treatments $Therapeutic Activity: 8-22 mins  Derl Barrow, OTR/L [place]  Office [phone] Pager: [place] 04/25/2020, 12:09 PM

## 2020-04-25 NOTE — Progress Notes (Signed)
[facility]  Date of Admission:  04/20/2020           Reason for visit: Follow up on lower extremity rash  Current antibiotics: Acyclovir prophylaxis Posaconazole prophylaxis Levaquin prophylaxis Topical terbinafine  Previous antibiotics: None   ASSESSMENT & PLAN:    #Distal lower extremity rash #Proximal lower extremity rash and abdominal rash #AML #Pancytopenia with severe neutropenia  Patient reports improvement of lower extremity lesions both distally and proximally over the last 24 hours.  There has been no progression of these lesions.  I reviewed Dr. Libby Maw note from yesterday and given the findings of 33% blasts this could be consistent with possible leukemia cutis.  Other consideration is capillaritis with microhemorrhage in the setting of severe thrombocytopenia.  Given her previous work-up at [facility], Sweet syndrome or other atypical infection seems less likely and these lesions are not progressing.  The proximal lower extremity and abdominal lesions with an annular appearance may be consistent with tinea infection however this would be less likely as patient was already on posaconazole for fungal prophylaxis in the setting of AML.  Nonetheless, trial of terbinafine 2 weeks seems reasonable and does not present a risk of toxicity. Will plan to sign off for now, please call with any questions/concerns.   Principal Problem:   AML (acute myeloblastic leukemia) (HCC) Active Problems:   Neutropenia (HCC)   Thrombocytopenia (HCC)   Symptomatic anemia   Malnutrition of moderate degree   Rash and nonspecific skin eruption    MEDICATIONS:    Scheduled Meds: . acyclovir  400 mg Oral BID  . cholecalciferol  2,000 Units Oral Daily  . escitalopram  5 mg Oral Daily  . feeding supplement  237 mL Oral TID BM  . levofloxacin  500 mg Oral Daily  . multivitamin with minerals  1 tablet Oral Daily  . oxyCODONE  20 mg Oral Q12H   . pantoprazole  40 mg Oral Daily  . posaconazole  300 mg Oral Daily  . pregabalin  150 mg Oral BID  . terbinafine   Topical BID  . traZODone  50 mg Oral QHS   Continuous Infusions: PRN Meds:.ALPRAZolam, ondansetron (ZOFRAN) IV, oxyCODONE  SUBJECTIVE:   24 hour events:  No acute events noted  Patient thinks that her lower extremity lesions have improved today.  She reports they are putting topical cream on both areas of her legs distally and proximally.  She is hopeful to go home for a few days prior to making her decision about transfer to rehab versus [facility].  She is hopeful to be around until August when she is expecting another grandchild.  She has no fevers or chills.  She has not noted any further bleeding.  Review of Systems  All other systems reviewed and are negative.     OBJECTIVE:   Blood pressure 107/66, pulse 70, temperature 98 F (36.7 C), temperature source Oral, resp. rate 16, height 5' 7.99" (1.727 m), weight 84 kg, SpO2 98 %. Body mass index is 28.16 kg/m.  Physical Exam Constitutional:      General: She is not in acute distress.    Appearance: Normal appearance.  Pulmonary:     Effort: Pulmonary effort is normal. No respiratory distress.  Skin:    Findings: Rash present.     Comments: Lower extremity rash is improved/stable.  See media tab for images from prior days.  Neurological:     General: No focal deficit present.  Mental Status: She is alert and oriented to person, place, and time.  Psychiatric:        Mood and Affect: Mood normal.        Behavior: Behavior normal.      Lab Results: Lab Results  Component Value Date   WBC 5.1 04/25/2020   HGB 8.3 (L) 04/25/2020   HCT 25.4 (L) 04/25/2020   MCV 95.5 04/25/2020   PLT 20 (LL) 04/25/2020    Lab Results  Component Value Date   NA 140 04/25/2020   K 3.6 04/25/2020   CO2 26 04/25/2020   GLUCOSE 140 (H) 04/25/2020   BUN <5 (L) 04/25/2020   CREATININE 0.72 04/25/2020   CALCIUM 8.9  04/25/2020   GFRNONAA >60 04/25/2020   GFRAA >60 10/04/2019    Lab Results  Component Value Date   ALT 15 04/25/2020   AST 48 (H) 04/25/2020   ALKPHOS 159 (H) 04/25/2020   BILITOT 0.8 04/25/2020    No results found for: CRP     Component Value Date/Time   ESRSEDRATE 9 01/10/2020 0935     I have reviewed the micro and lab results in Epic.  Imaging: US PELVIC COMPLETE WITH TRANSVAGINAL  Result Date: 04/23/2020 CLINICAL DATA:  Vaginal bleeding EXAM: TRANSABDOMINAL AND TRANSVAGINAL ULTRASOUND OF PELVIS TECHNIQUE: Both transabdominal and transvaginal ultrasound examinations of the pelvis were performed. Transabdominal technique was performed for global imaging of the pelvis including uterus, ovaries, adnexal regions, and pelvic cul-de-sac. It was necessary to proceed with endovaginal exam following the transabdominal exam to attempt to visualize the uterus endometrium ovaries. COMPARISON:  CT 09/09/2019 FINDINGS: Uterus Transvaginal images or markedly limited by patient pain tolerance and shadowing. Measurements: 8.9 x 4.5 x 5.8 cm = volume: 114.7 mL. Diffuse echogenic foci throughout the myometrium. Endometrium Thickness: 4.4 mm.  No focal abnormality visualized. Right ovary Measurements: 2.5 x 2.1 x 2 cm = volume: 5.4 mL. Normal appearance/no adnexal mass. Left ovary Measurements: 2.6 x 2.1 x 2.2 cm = volume: 6.3 mL. Normal appearance/no adnexal mass. Other findings No abnormal free fluid. IMPRESSION: 1. Limited study. Transvaginal images of limited utility due to pain. 2. Post menopausal endometrial thickness grossly within normal limits. 3. Heterogeneous myometrial echotexture with numerous echogenic foci indeterminate for microcalcifications from prior inflammation or infection versus vascular calcification versus echogenic foci associated with adenomyosis though no adequate transvaginal images are available. Depending upon clinical course, evaluation with pelvic MRI could be considered.  Electronically Signed   By: Donavan Foil M.D.   On: 04/23/2020 19:01     Imaging independently reviewed in Epic.    [facility] [phone] pager 04/25/2020, 1:00 PM  I spent greater than 25 minutes with the patient including greater than 50% of time in face to face counsel of the patient and in coordination of their care.

## 2020-04-25 NOTE — Progress Notes (Signed)
PROGRESS NOTE    Kelly Roberson  XVQ:[id number] DOB: 1960/04/16 DOA: 04/20/2020 PCP: Kristie Cowman, MD   Brief Narrative:  HPI per Dr. Holli Humbles on 04/20/20 Kelly Roberson a 60 y.o.femalewith medical history significant ofAcute Myeloid leukemia having been admitted to [facility] for the last 60 days. Was discharged home for 24h before she presented to oncology for further evaluation for ongoing weakness. She declined admission to hospital at that point but now presents again to oncology for ongoing weakness who is requesting admission to the hospital. Comorbid conditions include - HTN, Anxiety, Hepatitis B/C, IBS, RLS.   Timeline of events per oncology follow up: 1) 04/30/2019: patient presented to [facility] ED with progressive weakness. Found to have pancytopenia and peripheral blasts with intracellular inclusions.  2) 05/01/2019: transferred to [facility] 3) 05/04/2019: patient started induction therapy for AML with 7+3 and gemtuzumab on Day 4. 4) 05/16/2019: repeat Bmbx shows no residual leukemia 5) 06/17/2019: patient returned for Cycle 1 Consolidation with cytarabine and gemtuzumab 6) 06/24/2019: presents to reconnect with [facility] for co-managed care. 7) 07/15/2019: Cycle 2 of consolidation therapy  8) 07/30/2019: admitted for BRBPR/ Infectious colitis.  9) 08/26/2019: start of Cycle 3 of consolidation therapy at [facility]  10) 09/09/2019: admitted withneutropenicfever and BRBPR 11) 10/23/2019: evaluated by Dr. Elmo Putt. After discussion with him decided against oral azacitidine therapy 12) 02/17/2020-04/15/2020: admitted to [facility]. Treated with Decitabine/Venetoclax x 2 cycles 13) Presented to [facility] ED 04/16/20 for weakness/fall/ambulatory dysfunction. Refused admission and returned home despite discussion with ED and Oncology physician. 14) Presents to oncology 04/20/20 in the office for ongoing weakness and increased falls- admitted directly to hospital for PT evaluation and  platelet transfusion to be evaluated for placement. 15) 04/23/20 states she has Vaginal Bleeding now. Will transfuse 1 pack of platelets and asked ID to evaluate lesions on her legs as well. Transvaginal U/S ordered   **Interim History  PT evaluating and recommending CIR but she had Critically low WBC and Platelets which were contraindications to aggressive Rehab. CIR admission denied and recommendation was for SNF.  Her electrolytes are being repleted.  She developed a rash on her mid thighs and abdomen and then has lower extremity violaceous color flat lesions that appear to be microhemorrhages from low platelet count.  ID was consulted given that the proximal lesions look like ringworm and they have added topical terbinafine.  The day before she complained about having some vaginal bleeding however this may have been secondary to her mesh panties causing some irritation and some mild bleeding.  Platelet count has been improved with another unit and her vaginal bleeding is resolved.  Oncology reevaluated and reviewed her CBC and it shows 33% blasts which they think is a major driving force behind her increasing WBC count.  They are concerned with progression of the disease and [name] is to speak with Dr. Everlena Cooper at [facility] to see if there is any other options available for this patient.  If there is no ocular options she will need to consider comfort based/hospice.  [name] feels that the lower extremity findings may represent leukemic cutis with deposition of blast cells within the skin.  Medical oncology recommends the patient continue to work with physical therapy over the weekend with consideration to transfer to [facility] or [facility] early next week in the event that she does have further treatment options available.  Assessment & Plan:   Principal Problem:   AML (acute myeloblastic leukemia) ([place])  Active Problems:   Neutropenia (HCC)   Thrombocytopenia (HCC)    Symptomatic anemia   Malnutrition of moderate degree   Rash and nonspecific skin eruption  Ambulatory dysfunction, mechanical fall - Patient was taken to the emergency room on 04/16/2020 for a fall from standing with trauma to the head - negative workup at that time. - This occurred 1 day after discharge from [facility]. Her admission there lasted approximately 2 months. - Patient declined to be admitted to the hospital at that time- agreeable to admission. -PT to evaluate for possible needs/placement and they are recommending SNF and will need assistance with placement and she has bed offers; patient may be transferred to [facility] in the interim though for further treatment options for her AML  Acute on chronic thrombocytopenia; chronic pancytopenia Secondary to below -Secondary to below -Transfuse 1 pool platelets at admission and will get another now given Vaginal Bleeding  -Goal hemoglobin 8, goal platelet 20 now that she has evidence of bleeding  -Her WBC was 2.5, Hgb/Hct is 8.3/26.3 and platelet count is 13; ANC is 0.2 -Now her WBC is trending up to 5.1, hemoglobin/hematocrit is 8.3/24.5, platelet count is 20.  ANC is 0.3 now -Oncology recommending no GCSF  -Continue to monitor and trend and repeat CBC in a.m.   Hypokalemia, improved   -Unclear etiology, likely poor p.o. intake malnutrition, continue to advance diet as tolerated -K+ this AM was 3.6 and Mag level was 1.8 -Questionable episodes of nausea and vomiting per the patient prior to admission -Replete via IV given patient's refusal to take po Pills and Liquid formulations if necessary -Mag level improving -Continue To monitor and replete as necessary -Repeat CMP in the AM   Hypomagnesemia, improved  -Patient magnesium level was 1.8 -Replete with IV mag sulfate 2 g  -Continue to monitor replete as necessary -Repeat magnesium level in a.m.  Elevated AST -Mild -Patient's AST went from 21 -> 33  -> 39 -> 48 -Continue to Monitor and Trend -Repeat CMP in the AM   Acute Myeloid Leukemia, Favorable Risk (RUNX1/RUNX1T1), likely progressing -Continue f/u at [facility] with Dr. Elmo Putt. -Follows locally with Dr Lorenso Courier -twice weekly blood draws with q 2 week clinic visits. -Currently on posaconazole, acyclovir and ID has resumed her levofloxacin yesterday -Appreciate Dr. Libby Maw evaluation and Recc's; He feels that she may not have fully been treated as she has blasts floating around in her blood and is concerned given that her CBC shows 33% blasts and [name] feels that this is the major driving force behind her increasing WBC count.  These findings are strongly consistent with progression of her disease and [name] is to reach out to Dr. Lysle Dingwall at [facility] to see if she if she has any other options for treatment at there is no other options then she may need to be considering comfort base/hospice care -[name] feels that the lower extremity findings may represent leukemic cutis with deposition of the blast cells within the skin. -We will need continuing work with physical therapy over the weekend and consider transfer to tertiary care center in the event there are further treatments options available  Vaginal Bleeding, ruled out -Initially thought to be vaginal bleeding but now likely secondary to irritation from mesh panties -Also will transfuse 1 pack of Platelets to try to get Platelet Goal >20 -Dr. Rip Harbour to follow up on Transvaginal U/S and make additional Recc's -Transvaginal ultrasound showed "Limited study. Transvaginal images  of limited utility due to pain. Post menopausal endometrial thickness grossly within normal limits. Heterogeneous myometrial echotexture with numerous echogenic foci indeterminate for microcalcifications from prior inflammation or infection versus vascular calcification versus echogenic foci associated with adenomyosis though no  adequate  transvaginal images are available. Depending upon clinical course, evaluation with pelvic MRI could be  Considered." -Now that this is resolved she will need outpatient GYN follow-up  ?Tinea Corporis vs other Rash and other violaceous lesions on the lower extremities; concern for leukemic cutis -Unclear Rash on the LE that has proximal lesions thatare circular appearing erythematous plaques but she also has violaceous lesions that are flat on the lower extremities; LE Lesions could be Leukemia Cutis  -Already on Antifungals with posaconazole but have reached out to pharmacy to see if she needs a topical formulation for ringworm -I asked ID to see the patient and they have started the patient on topical terbinafine for Proximal Lesions  -Had lesions Biopsied at [facility] and was notable for spongiosis with hemorrhage but has now had progression of these new lesions on the LE with continued Neutropenia -ID feels her DDx includes Inflammatory Dermatitis with 2/2 Hemorrhage vs Infectious Etiology (Fungal, Bacterial, Viral) in the setting of Neutropenia vs. Sweet Syndrome vs. Leukemia Cutis -ID discussing with Dermatology and pleased to have an unclear etiology of her lower extremity lesions but it could be that she has 2 different things going on.  Dr. Juleen China informally discussed with dermatology and they feel that the lower lesions could be capillaritis with micro hemorrhaging due to severe thrombocytopenia and less likely Sweet syndrome, leukemic cutis or atypical infection given that the lesions are flat.  She does have a left lateral calf area with some necrosis that needs to be closely monitored that represents bullous sweets or atypical fungal infection but will continue to monitor see if this improves his with the improvement in her thrombocytopenia -The proximal lesions are more consistent with a tinea infection or an irritant dermatitis and they are going to add topical terbinafine for 2  weeks -If lesions do not improve or progress she will require another biopsy with AFB, fungal, bacterial cultures and pathology to help determine etiology -Medical oncology evaluated and feels that this is consistent with leukemic cutis given her 33% blast cells on her CBC -WOC nurse consulted to remove stitch from prior Bx site  DVT prophylaxis: SCDs given thrombocytopenia Code Status: FULL CODE  Family Communication: No family present at bedside  Disposition Plan: Anticipating D/C to SNF when medically stable and cleared by ID and Oncology but may need to be transferred to [facility] for Progression of AML   Status is: Inpatient  Remains inpatient appropriate because:Unsafe d/c plan, IV treatments appropriate due to intensity of illness or inability to take PO and Inpatient level of care appropriate due to severity of illness   Dispo:  Patient From: Home  Planned Disposition: [place] vs Transfer to [facility]  Medically stable for discharge: Yes     Consultants:   Medical Oncology  Infectious Diseases  Spoke to OB-GYN Dr. Arlina Robes  Procedures: None  Antimicrobials:  Anti-infectives (From admission, onward)   Start     Dose/Rate Route Frequency Ordered Stop   04/23/20 1415  levofloxacin (LEVAQUIN) tablet 500 mg        500 mg Oral Daily 04/23/20 1318     04/21/20 1000  acyclovir (ZOVIRAX) tablet 400 mg        400 mg  Oral 2 times daily 04/21/20 0750     04/21/20 1000  posaconazole (NOXAFIL) delayed-release tablet 300 mg        300 mg Oral Daily 04/21/20 0750          Subjective: Seen and examined at bedside and still feels weak.  Thinks her lower extremity lesions are improving slightly.  Platelet count still very low.  No nausea or vomiting.  Frustrated and I discussed with her about the findings of possible progression of her disease and she wanted to speak with [name].  No other concerns or complaints at this  time.  Objective: Vitals:   04/24/20 2112 04/25/20 0452 04/25/20 1440 04/25/20 1441  BP: 134/65 107/66 128/74   Pulse: 76 70  72  Resp: 16 16  20   Temp: 98 F (36.7 C) 98 F (36.7 C)  97.6 F (36.4 C)  TempSrc: Oral Oral  Oral  SpO2: 93% 98%  99%  Weight:      Height:        Intake/Output Summary (Last 24 hours) at 04/25/2020 1631 Last data filed at 04/25/2020 1418 Gross per 24 hour  Intake 664.74 ml  Output --  Net 664.74 ml   Filed Weights   04/20/20 1500  Weight: 84 kg   Examination: Physical Exam:  Constitutional: WN/WD chronically ill-appearing Caucasian female currently in no acute distress appears a little frustrated but does appear fatigued Eyes: Has a slightly red eye on the right ENMT: External Ears, Nose appear normal. Grossly normal hearing.  Neck: Appears normal, supple, no cervical masses, normal ROM, no appreciable thyromegaly: No JVD Respiratory: Diminished to auscultation bilaterally, no wheezing, rales, rhonchi or crackles. Normal respiratory effort and patient is not tachypenic. No accessory muscle use. Unlabored breathing  Cardiovascular: RRR, no murmurs / rubs / gallops. S1 and S2 auscultated. 1+ LE Edema  Abdomen: Soft, non-tender, Distended 2/2 Body Habitus.  Bowel sounds positive.  GU: Deferred. Musculoskeletal: No clubbing / cyanosis of digits/nails. No joint deformity upper and lower extremities.  Skin: Has 2 different types of lesions on her legs with 1 flat violaceous ones with likely consistent with leukemic cutis and the proximal ones on her inner thighs and lower abdomen consistent with tinea corporis. No induration; Warm and dry.  Neurologic: CN 2-12 grossly intact with no focal deficits. Romberg sign and cerebellar reflexes not assessed.  Psychiatric: Normal judgment and insight. Alert and oriented x 3. Normal mood and appropriate affect.   Data Reviewed: I have personally reviewed following labs and imaging studies  CBC: Recent Labs   Lab 04/20/20 1117 04/21/20 0606 04/22/20 0532 04/23/20 0752 04/24/20 0542 04/25/20 0606  WBC 1.0* 1.0* 1.5* 2.5* 4.4 5.1  NEUTROABS 0.1*  --   --  0.2* 0.1* 0.3*  HGB 8.1* 7.2* 8.0* 8.3* 7.9* 8.3*  HCT 23.3* 21.3* 23.9* 26.3* 24.8* 25.4*  MCV 89.6 92.6 93.0 96.7 95.8 95.5  PLT 9* 15* 12* 13* 28* 20*   Basic Metabolic Panel: Recent Labs  Lab 04/21/20 0606 04/22/20 0532 04/23/20 0752 04/24/20 0542 04/25/20 0606  NA 140 140 143 143 140  K 2.4* 2.6* 3.9 3.5 3.6  CL 104 102 105 105 102  CO2 29 28 26 29 26   GLUCOSE 118* 100* 100* 127* 140*  BUN <5* <5* <5* <5* <5*  CREATININE 0.53 0.55 0.73 0.79 0.72  CALCIUM 7.8* 8.0* 8.7* 8.7* 8.9  MG  --  1.4* 2.0 1.6* 1.8  PHOS  --  3.5 2.6 2.9 2.7  GFR: Estimated Creatinine Clearance: 83.8 mL/min (by C-G formula based on SCr of 0.72 mg/dL). Liver Function Tests: Recent Labs  Lab 04/20/20 1117 04/21/20 0606 04/23/20 0752 04/24/20 0542 04/25/20 0606  AST 25 21 33 39 48*  ALT 12 12 11 14 15   ALKPHOS 186* 137* 142* 150* 159*  BILITOT 1.0 0.9 1.0 0.9 0.8  PROT 5.6* 4.9* 5.3* 5.2* 5.4*  ALBUMIN 3.0* 2.7* 3.0* 2.9* 2.9*   No results for input(s): LIPASE, AMYLASE in the last 168 hours. No results for input(s): AMMONIA in the last 168 hours. Coagulation Profile: Recent Labs  Lab 04/21/20 0606  INR 1.3*   Cardiac Enzymes: No results for input(s): CKTOTAL, CKMB, CKMBINDEX, TROPONINI in the last 168 hours. BNP (last 3 results) No results for input(s): PROBNP in the last 8760 hours. HbA1C: No results for input(s): HGBA1C in the last 72 hours. CBG: No results for input(s): GLUCAP in the last 168 hours. Lipid Profile: No results for input(s): CHOL, HDL, LDLCALC, TRIG, CHOLHDL, LDLDIRECT in the last 72 hours. Thyroid Function Tests: No results for input(s): TSH, T4TOTAL, FREET4, T3FREE, THYROIDAB in the last 72 hours. Anemia Panel: No results for input(s): VITAMINB12, FOLATE, FERRITIN, TIBC, IRON, RETICCTPCT in the last 72  hours. Sepsis Labs: No results for input(s): PROCALCITON, LATICACIDVEN in the last 168 hours.  Recent Results (from the past 240 hour(s))  SARS CORONAVIRUS 2 (TAT 6-24 HRS) Nasopharyngeal Nasopharyngeal Swab     Status: None   Collection Time: 04/21/20  1:37 AM   Specimen: Nasopharyngeal Swab  Result Value Ref Range Status   SARS Coronavirus 2 NEGATIVE NEGATIVE Final    Comment: (NOTE) SARS-CoV-2 target nucleic acids are NOT DETECTED.  The SARS-CoV-2 RNA is generally detectable in upper and lower respiratory specimens during the acute phase of infection. Negative results do not preclude SARS-CoV-2 infection, do not rule out co-infections with other pathogens, and should not be used as the sole basis for treatment or other patient management decisions. Negative results must be combined with clinical observations, patient history, and epidemiological information. The expected result is Negative.  Fact Sheet for Patients: SugarRoll.be  Fact Sheet for Healthcare Providers: https://www.woods-mathews.com/  This test is not yet approved or cleared by the [place] FDA and  has been authorized for detection and/or diagnosis of SARS-CoV-2 by FDA under an Emergency Use Authorization (EUA). This EUA will remain  in effect (meaning this test can be used) for the duration of the COVID-19 declaration under Se ction 564(b)(1) of the Act, 21 U.S.C. section 360bbb-3(b)(1), unless the authorization is terminated or revoked sooner.  Performed at [facility], [place] [address], [place], [place] [address]     RN Pressure Injury Documentation:     Estimated body mass index is 28.16 kg/m as calculated from the following:   Height as of this encounter: 5' 7.99" (1.727 m).   Weight as of this encounter: 84 kg.  Malnutrition Type:  Nutrition Problem: Moderate Malnutrition Etiology: chronic illness,cancer and cancer related  treatments  Malnutrition Characteristics:  Signs/Symptoms: mild fat depletion,mild muscle depletion  Nutrition Interventions:  Interventions: Ensure Enlive (each supplement provides 350kcal and 20 grams of protein),MVI   Radiology Studies: US PELVIC COMPLETE WITH TRANSVAGINAL  Result Date: 04/23/2020 CLINICAL DATA:  Vaginal bleeding EXAM: TRANSABDOMINAL AND TRANSVAGINAL ULTRASOUND OF PELVIS TECHNIQUE: Both transabdominal and transvaginal ultrasound examinations of the pelvis were performed. Transabdominal technique was performed for global imaging of the pelvis including uterus, ovaries, adnexal regions, and pelvic cul-de-sac. It was necessary  to proceed with endovaginal exam following the transabdominal exam to attempt to visualize the uterus endometrium ovaries. COMPARISON:  CT 09/09/2019 FINDINGS: Uterus Transvaginal images or markedly limited by patient pain tolerance and shadowing. Measurements: 8.9 x 4.5 x 5.8 cm = volume: 114.7 mL. Diffuse echogenic foci throughout the myometrium. Endometrium Thickness: 4.4 mm.  No focal abnormality visualized. Right ovary Measurements: 2.5 x 2.1 x 2 cm = volume: 5.4 mL. Normal appearance/no adnexal mass. Left ovary Measurements: 2.6 x 2.1 x 2.2 cm = volume: 6.3 mL. Normal appearance/no adnexal mass. Other findings No abnormal free fluid. IMPRESSION: 1. Limited study. Transvaginal images of limited utility due to pain. 2. Post menopausal endometrial thickness grossly within normal limits. 3. Heterogeneous myometrial echotexture with numerous echogenic foci indeterminate for microcalcifications from prior inflammation or infection versus vascular calcification versus echogenic foci associated with adenomyosis though no adequate transvaginal images are available. Depending upon clinical course, evaluation with pelvic MRI could be considered. Electronically Signed   By: Donavan Foil M.D.   On: 04/23/2020 19:01    Scheduled Meds: . acyclovir  400 mg Oral BID  .  cholecalciferol  2,000 Units Oral Daily  . escitalopram  5 mg Oral Daily  . feeding supplement  237 mL Oral TID BM  . levofloxacin  500 mg Oral Daily  . multivitamin with minerals  1 tablet Oral Daily  . oxyCODONE  20 mg Oral Q12H  . pantoprazole  40 mg Oral Daily  . posaconazole  300 mg Oral Daily  . pregabalin  150 mg Oral BID  . terbinafine   Topical BID  . traZODone  50 mg Oral QHS   Continuous Infusions:   LOS: 5 days   Kerney Elbe, DO [facility] PAGER is on [place]  If 7PM-7AM, please contact night-coverage [email or web address]

## 2020-04-25 NOTE — Progress Notes (Signed)
Date and time results received: 04/25/20 0825  (use smartphrase ".now" to insert current time)   Critical Value: platelets 20  Name of Provider Notified: Dr. Alfredia Ferguson   Orders Received? Or Actions Taken?: Actions Taken: n/a

## 2020-04-26 DIAGNOSIS — D696 Thrombocytopenia, unspecified: Secondary | ICD-10-CM | POA: Diagnosis not present

## 2020-04-26 DIAGNOSIS — R21 Rash and other nonspecific skin eruption: Secondary | ICD-10-CM | POA: Diagnosis not present

## 2020-04-26 DIAGNOSIS — D701 Agranulocytosis secondary to cancer chemotherapy: Secondary | ICD-10-CM | POA: Diagnosis not present

## 2020-04-26 DIAGNOSIS — C9202 Acute myeloblastic leukemia, in relapse: Secondary | ICD-10-CM | POA: Diagnosis not present

## 2020-04-26 LAB — COMPREHENSIVE METABOLIC PANEL
ALT: 15 U/L (ref 0–44)
AST: 54 U/L — ABNORMAL HIGH (ref 15–41)
Albumin: 2.6 g/dL — ABNORMAL LOW (ref 3.5–5.0)
Alkaline Phosphatase: 185 U/L — ABNORMAL HIGH (ref 38–126)
Anion gap: 9 (ref 5–15)
BUN: <5 mg/dL — ABNORMAL LOW (ref 8–23)
CO2: 30 mmol/L (ref 22–32)
Calcium: 9 mg/dL (ref 8.9–10.3)
Chloride: 105 mmol/L (ref 98–111)
Creatinine, Ser: 0.73 mg/dL (ref 0.44–1.00)
GFR, Estimated: >60 mL/min (ref >60)
Glucose, Bld: 99 mg/dL (ref 70–99)
Potassium: 3.9 mmol/L (ref 3.5–5.1)
Sodium: 144 mmol/L (ref 135–145)
Total Bilirubin: 0.5 mg/dL (ref 0.3–1.2)
Total Protein: 5 g/dL — ABNORMAL LOW (ref 6.5–8.1)

## 2020-04-26 LAB — CBC WITH DIFFERENTIAL/PLATELET
Abs Immature Granulocytes: NONE SEEN 10*3/uL (ref 0.00–0.07)
Band Neutrophils: 1 %
Basophils Relative: 0 %
Blasts: 127 %
Eosinophils Relative: 0 %
HCT: 20.9 % — ABNORMAL LOW (ref 36.0–46.0)
Hemoglobin: 6.7 g/dL — CL (ref 12.0–15.0)
Immature Granulocytes: NONE SEEN %
Lymphocytes Relative: 94 %
MCH: 30.7 pg (ref 26.0–34.0)
MCHC: 32.1 g/dL (ref 30.0–36.0)
MCV: 95.9 fL (ref 80.0–100.0)
Metamyelocytes Relative: NONE SEEN %
Monocytes Relative: 3 %
Myelocytes: NONE SEEN %
Neutrophils Relative %: 2 %
Platelets: 12 10*3/uL — CL (ref 150–400)
Promyelocytes Relative: NONE SEEN %
RBC Morphology: NORMAL
RBC: 2.18 MIL/uL — ABNORMAL LOW (ref 3.87–5.11)
RDW: 16.1 % — ABNORMAL HIGH (ref 11.5–15.5)
WBC: 6.6 10*3/uL (ref 4.0–10.5)
nRBC: 0.5 % — ABNORMAL HIGH (ref 0.0–0.2)
nRBC: 1 /100 WBC — ABNORMAL HIGH

## 2020-04-26 LAB — PREPARE RBC (CROSSMATCH)

## 2020-04-26 LAB — PHOSPHORUS: Phosphorus: 3.1 mg/dL (ref 2.5–4.6)

## 2020-04-26 LAB — MAGNESIUM: Magnesium: 1.9 mg/dL (ref 1.7–2.4)

## 2020-04-26 MED ORDER — SODIUM CHLORIDE 0.9% IV SOLUTION: Freq: Once | INTRAVENOUS | Status: AC

## 2020-04-26 NOTE — Progress Notes (Signed)
PROGRESS NOTE    Kelly Roberson  JSE:[id number] DOB: 1960/12/14 DOA: 04/20/2020 PCP: Kristie Cowman, MD   Brief Narrative:  HPI per Dr. Holli Humbles on 04/20/20 Kelly Roberson a 60 y.o.femalewith medical history significant ofAcute Myeloid leukemia having been admitted to [facility] for the last 60 days. Was discharged home for 24h before she presented to oncology for further evaluation for ongoing weakness. She declined admission to hospital at that point but now presents again to oncology for ongoing weakness who is requesting admission to the hospital. Comorbid conditions include - HTN, Anxiety, Hepatitis B/C, IBS, RLS.   Timeline of events per oncology follow up: 1) 04/30/2019: patient presented to [facility] ED with progressive weakness. Found to have pancytopenia and peripheral blasts with intracellular inclusions.  2) 05/01/2019: transferred to [facility] 3) 05/04/2019: patient started induction therapy for AML with 7+3 and gemtuzumab on Day 4. 4) 05/16/2019: repeat Bmbx shows no residual leukemia 5) 06/17/2019: patient returned for Cycle 1 Consolidation with cytarabine and gemtuzumab 6) 06/24/2019: presents to reconnect with [facility] for co-managed care. 7) 07/15/2019: Cycle 2 of consolidation therapy  8) 07/30/2019: admitted for BRBPR/ Infectious colitis.  9) 08/26/2019: start of Cycle 3 of consolidation therapy at [facility]  10) 09/09/2019: admitted withneutropenicfever and BRBPR 11) 10/23/2019: evaluated by Dr. Elmo Putt. After discussion with him decided against oral azacitidine therapy 12) 02/17/2020-04/15/2020: admitted to [facility]. Treated with Decitabine/Venetoclax x 2 cycles 13) Presented to [facility] ED 04/16/20 for weakness/fall/ambulatory dysfunction. Refused admission and returned home despite discussion with ED and Oncology physician. 14) Presents to oncology 04/20/20 in the office for ongoing weakness and increased falls- admitted directly to hospital for PT evaluation and  platelet transfusion to be evaluated for placement. 15) 04/23/20 states she has Vaginal Bleeding now. Will transfuse 1 pack of platelets and asked ID to evaluate lesions on her legs as well. Transvaginal U/S ordered   **Interim History  PT evaluating and recommending CIR but she had Critically low WBC and Platelets which were contraindications to aggressive Rehab. CIR admission denied and recommendation was for SNF.  Her electrolytes are being repleted.  She developed a rash on her mid thighs and abdomen and then has lower extremity violaceous color flat lesions that appear to be microhemorrhages from low platelet count.  ID was consulted given that the proximal lesions look like ringworm and they have added topical terbinafine.  The day before she complained about having some vaginal bleeding however this may have been secondary to her mesh panties causing some irritation and some mild bleeding.  Platelet count has been improved with another unit and her vaginal bleeding is resolved.  Oncology reevaluated and reviewed her CBC and it shows 33% blasts which they think is a major driving force behind her increasing WBC count.  They are concerned with progression of the disease and [name] is to speak with Dr. Everlena Cooper at [facility] to see if there is any other options available for this patient.  If there is no ocular options she will need to consider comfort based/hospice.  [name] feels that the lower extremity findings may represent leukemic cutis with deposition of blast cells within the skin.  Medical oncology recommends the patient continue to work with physical therapy over the weekend with consideration to transfer to [facility] or [facility] early next week in the event that she does have further treatment options available.  On 04/26/2020 her blood count dropped to 6.7 and platelet count was 12 so  she is typed and screened and transfused 1 unit of PRBCs as well as 1 unit of  platelets.  Patient is all frustrated and wants to speak with her oncologist about further plan of care and direction of treatment.  Assessment & Plan:   Principal Problem:   AML (acute myeloblastic leukemia) ([place]) Active Problems:   Neutropenia (HCC)   Thrombocytopenia (HCC)   Symptomatic anemia   Malnutrition of moderate degree   Rash and nonspecific skin eruption  Ambulatory dysfunction, mechanical fall - Patient was taken to the emergency room on 04/16/2020 for a fall from standing with trauma to the head - negative workup at that time. - This occurred 1 day after discharge from [facility]. Her admission there lasted approximately 2 months. - Patient declined to be admitted to the hospital at that time- agreeable to admission. -PT to evaluate for possible needs/placement and they are recommending SNF and will need assistance with placement and she has bed offers; patient may be transferred to [facility] in the interim though for further treatment options for her AML pending on Dr. Libby Maw discussion with the oncologist at the tertiary care centers  Acute on chronic thrombocytopenia; chronic pancytopenia Secondary to below -Secondary to below -She is typed and screened and she is transfused 2 units of platelets and will get another 1 today given her platelet count of 12 -Goal hemoglobin 8, goal platelet 20 now that she has evidence of bleeding  -Yesterday her WBC was trending up to 5.1, hemoglobin/hematocrit is 8.3/24.5, platelet count is 20.  ANC is 0.3  -Today her WBC was 6.6, hemoglobin/hematocrit was 6.7/20.9 and platelet count was 12 We will type and screen and transfuse 1 unit PRBCs today as well as the platelet count as mentioned above -Oncology recommending no GCSF  -Continue to monitor and trend and repeat CBC in a.m.   Hypokalemia, improved   -Unclear etiology, likely poor p.o. intake malnutrition, continue to advance diet as tolerated -K+ this AM  was 3.9 and Mag level was 1.9 -Questionable episodes of nausea and vomiting per the patient prior to admission -Replete via IV given patient's refusal to take po Pills and Liquid formulations if necessary -Mag level improving -Continue To monitor and replete as necessary -Repeat CMP in the AM   Hypomagnesemia, improved  -Patient magnesium level was 1.9 -Continue to monitor replete as necessary -Repeat magnesium level in a.m.  Elevated AST -Mild -Patient's AST went from 21 -> 33 -> 39 -> 48 and today it is 54 -Continue to Monitor and Trend -Repeat CMP in the AM   Acute Myeloid Leukemia, Favorable Risk (RUNX1/RUNX1T1), likely progressing worsening -Continue f/u at [facility] with Dr. Elmo Putt. -Follows locally with Dr Lorenso Courier -twice weekly blood draws with q 2 week clinic visits. -Currently on posaconazole, acyclovir and ID has resumed her levofloxacin yesterday -Appreciate Dr. Libby Maw evaluation and Recc's; He feels that she may not have fully been treated as she has blasts floating around in her blood and is concerned given that her CBC shows 33% blasts and [name] feels that this is the major driving force behind her increasing WBC count.  These findings are strongly consistent with progression of her disease and [name] is to reach out to Dr. Lysle Dingwall at [facility] to see if she if she has any other options for treatment at there is no other options then she may need to be considering comfort base/hospice care -[name] feels that the lower extremity findings  may represent leukemic cutis with deposition of the blast cells within the skin. -We will need continuing work with physical therapy over the weekend and consider transfer to tertiary care center in the event there are further treatments options available -see above  Vaginal Bleeding, ruled out -Initially thought to be vaginal bleeding but now likely secondary to irritation from mesh panties -Also  will transfuse 1 pack of Platelets to try to get Platelet Goal >20 -Dr. Rip Harbour to follow up on Transvaginal U/S and make additional Recc's -Transvaginal ultrasound showed "Limited study. Transvaginal images of limited utility due to pain. Post menopausal endometrial thickness grossly within normal limits. Heterogeneous myometrial echotexture with numerous echogenic foci indeterminate for microcalcifications from prior inflammation or infection versus vascular calcification versus echogenic foci associated with adenomyosis though no adequate  transvaginal images are available. Depending upon clinical course, evaluation with pelvic MRI could be  Considered." -Now that this is resolved she will need outpatient GYN follow-up  ?Tinea Corporis vs other Rash and other violaceous lesions on the lower extremities; concern for leukemic cutis, slowly improving -Unclear Rash on the LE that has proximal lesions thatare circular appearing erythematous plaques but she also has violaceous lesions that are flat on the lower extremities; LE Lesions could be Leukemia Cutis  -Already on Antifungals with posaconazole but have reached out to pharmacy to see if she needs a topical formulation for ringworm -I asked ID to see the patient and they have started the patient on topical terbinafine for Proximal Lesions  -Had lesions Biopsied at [facility] and was notable for spongiosis with hemorrhage but has now had progression of these new lesions on the LE with continued Neutropenia -ID feels her DDx includes Inflammatory Dermatitis with 2/2 Hemorrhage vs Infectious Etiology (Fungal, Bacterial, Viral) in the setting of Neutropenia vs. Sweet Syndrome vs. Leukemia Cutis -ID discussing with Dermatology and pleased to have an unclear etiology of her lower extremity lesions but it could be that she has 2 different things going on.  Dr. Juleen China informally discussed with dermatology and they feel that the lower lesions could be capillaritis with  micro hemorrhaging due to severe thrombocytopenia and less likely Sweet syndrome, leukemic cutis or atypical infection given that the lesions are flat.  She does have a left lateral calf area with some necrosis that needs to be closely monitored that represents bullous sweets or atypical fungal infection but will continue to monitor see if this improves his with the improvement in her thrombocytopenia -The proximal lesions are more consistent with a tinea infection or an irritant dermatitis and they are going to add topical terbinafine for 2 weeks -If lesions do not improve or progress she will require another biopsy with AFB, fungal, bacterial cultures and pathology to help determine etiology -Medical oncology evaluated and feels that this is consistent with leukemic cutis given her 33% blast cells on her CBC -WOC nurse consulted to remove stitch from prior Bx site -Patient's thinks that her lesions are improving with terbinafine  DVT prophylaxis: SCDs given thrombocytopenia Code Status: FULL CODE  Family Communication: No family present at bedside  Disposition Plan: Anticipating D/C to SNF when medically stable and cleared by ID and Oncology but may need to be transferred to [facility] for Progression of AML   Status is: Inpatient  Remains inpatient appropriate because:Unsafe d/c plan, IV treatments appropriate due to intensity of illness or inability to take PO and Inpatient level of care appropriate due to severity of illness   Dispo:  Patient From: Home  Planned Disposition: Skilled Nursing Facility vs Transfer to [facility]  Medically stable for discharge: Yes     Consultants:   Medical Oncology  Infectious Diseases  Spoke to OB-GYN Dr. Nettie Elm  Procedures: None  Antimicrobials:  Anti-infectives (From admission, onward)   Start     Dose/Rate Route Frequency Ordered Stop   04/23/20 1415  levofloxacin (LEVAQUIN) tablet 500 mg        500 mg Oral Daily  04/23/20 1318     04/21/20 1000  acyclovir (ZOVIRAX) tablet 400 mg        400 mg Oral 2 times daily 04/21/20 0750     04/21/20 1000  posaconazole (NOXAFIL) delayed-release tablet 300 mg        300 mg Oral Daily 04/21/20 0750          Subjective: Seen and examined at bedside feels weak and was very frustrated.  Thinks her lower extremity lesions are improving.  No nausea or vomiting.  No other concerns or complaints at this time and frustrated about not knowing the plan about whether getting transferred.  Objective: Vitals:   04/26/20 0832 04/26/20 1413 04/26/20 1511 04/26/20 1537  BP:  (!) 116/59 121/66 121/61  Pulse:  74 70 64  Resp:  18 16 16   Temp:  97.9 F (36.6 C) 98 F (36.7 C) 98 F (36.7 C)  TempSrc:  Oral Oral Oral  SpO2: 99% 95% 91% 92%  Weight:      Height:        Intake/Output Summary (Last 24 hours) at 04/26/2020 1656 Last data filed at 04/26/2020 1512 Gross per 24 hour  Intake 280 ml  Output --  Net 280 ml   Filed Weights   04/20/20 1500  Weight: 84 kg   Examination: Physical Exam:  Constitutional: WN/WD chronically ill-appearing Caucasian female currently no acute distress appears very frustrated and weak Eyes: Right Eye Is Slightly Red and Has a Slight Cut on the Side ENMT: External Ears, Nose appear normal. Grossly normal hearing.  Neck: Appears normal, supple, no cervical masses, normal ROM, no appreciable thyromegaly; no JVD Respiratory: Diminished to auscultation bilaterally, no wheezing, rales, rhonchi or crackles. Normal respiratory effort and patient is not tachypenic. No accessory muscle use.  Cardiovascular: RRR, no murmurs / rubs / gallops. S1 and S2 auscultated.  1+ lower extremity edema Abdomen: Soft, non-tender, distended secondary body habitus. Bowel sounds positive.  GU: Deferred. Musculoskeletal: No clubbing / cyanosis of digits/nails. No joint deformity upper and lower extremities.  Skin: Has 2 different types of lower extremity  lesions which are both improving.. No induration; Warm and dry.  Neurologic: CN 2-12 grossly intact with no focal deficits. Romberg sign and cerebellar reflexes not assessed.  Psychiatric: Normal judgment and insight. Alert and oriented x 3. Anxious mood and appropriate affect.   Data Reviewed: I have personally reviewed following labs and imaging studies  CBC: Recent Labs  Lab 04/20/20 1117 04/21/20 0606 04/22/20 0532 04/23/20 0752 04/24/20 0542 04/25/20 0606 04/26/20 0546  WBC 1.0*   < > 1.5* 2.5* 4.4 5.1 6.6  NEUTROABS 0.1*  --   --  0.2* 0.1* 0.3*  --   HGB 8.1*   < > 8.0* 8.3* 7.9* 8.3* 6.7*  HCT 23.3*   < > 23.9* 26.3* 24.8* 25.4* 20.9*  MCV 89.6   < > 93.0 96.7 95.8 95.5 95.9  PLT 9*   < > 12* 13* 28* 20* 12*   < > =  values in this interval not displayed.   Basic Metabolic Panel: Recent Labs  Lab 04/22/20 0532 04/23/20 0752 04/24/20 0542 04/25/20 0606 04/26/20 0546  NA 140 143 143 140 144  K 2.6* 3.9 3.5 3.6 3.9  CL 102 105 105 102 105  CO2 28 26 29 26 30   GLUCOSE 100* 100* 127* 140* 99  BUN <5* <5* <5* <5* <5*  CREATININE 0.55 0.73 0.79 0.72 0.73  CALCIUM 8.0* 8.7* 8.7* 8.9 9.0  MG 1.4* 2.0 1.6* 1.8 1.9  PHOS 3.5 2.6 2.9 2.7 3.1   GFR: Estimated Creatinine Clearance: 83.8 mL/min (by C-G formula based on SCr of 0.73 mg/dL). Liver Function Tests: Recent Labs  Lab 04/21/20 0606 04/23/20 0752 04/24/20 0542 04/25/20 0606 04/26/20 0546  AST 21 33 39 48* 54*  ALT 12 11 14 15 15   ALKPHOS 137* 142* 150* 159* 185*  BILITOT 0.9 1.0 0.9 0.8 0.5  PROT 4.9* 5.3* 5.2* 5.4* 5.0*  ALBUMIN 2.7* 3.0* 2.9* 2.9* 2.6*   No results for input(s): LIPASE, AMYLASE in the last 168 hours. No results for input(s): AMMONIA in the last 168 hours. Coagulation Profile: Recent Labs  Lab 04/21/20 0606  INR 1.3*   Cardiac Enzymes: No results for input(s): CKTOTAL, CKMB, CKMBINDEX, TROPONINI in the last 168 hours. BNP (last 3 results) No results for input(s): PROBNP in the  last 8760 hours. HbA1C: No results for input(s): HGBA1C in the last 72 hours. CBG: No results for input(s): GLUCAP in the last 168 hours. Lipid Profile: No results for input(s): CHOL, HDL, LDLCALC, TRIG, CHOLHDL, LDLDIRECT in the last 72 hours. Thyroid Function Tests: No results for input(s): TSH, T4TOTAL, FREET4, T3FREE, THYROIDAB in the last 72 hours. Anemia Panel: No results for input(s): VITAMINB12, FOLATE, FERRITIN, TIBC, IRON, RETICCTPCT in the last 72 hours. Sepsis Labs: No results for input(s): PROCALCITON, LATICACIDVEN in the last 168 hours.  Recent Results (from the past 240 hour(s))  SARS CORONAVIRUS 2 (TAT 6-24 HRS) Nasopharyngeal Nasopharyngeal Swab     Status: None   Collection Time: 04/21/20  1:37 AM   Specimen: Nasopharyngeal Swab  Result Value Ref Range Status   SARS Coronavirus 2 NEGATIVE NEGATIVE Final    Comment: (NOTE) SARS-CoV-2 target nucleic acids are NOT DETECTED.  The SARS-CoV-2 RNA is generally detectable in upper and lower respiratory specimens during the acute phase of infection. Negative results do not preclude SARS-CoV-2 infection, do not rule out co-infections with other pathogens, and should not be used as the sole basis for treatment or other patient management decisions. Negative results must be combined with clinical observations, patient history, and epidemiological information. The expected result is Negative.  Fact Sheet for Patients: SugarRoll.be  Fact Sheet for Healthcare Providers: https://www.woods-mathews.com/  This test is not yet approved or cleared by the [place] FDA and  has been authorized for detection and/or diagnosis of SARS-CoV-2 by FDA under an Emergency Use Authorization (EUA). This EUA will remain  in effect (meaning this test can be used) for the duration of the COVID-19 declaration under Se ction 564(b)(1) of the Act, 21 U.S.C. section 360bbb-3(b)(1), unless the  authorization is terminated or revoked sooner.  Performed at [facility], [place] [address] [place], [place] [address]     RN Pressure Injury Documentation:     Estimated body mass index is 28.16 kg/m as calculated from the following:   Height as of this encounter: 5' 7.99" (1.727 m).   Weight as of this encounter: 84 kg.  Malnutrition  Type:  Nutrition Problem: Moderate Malnutrition Etiology: chronic illness,cancer and cancer related treatments  Malnutrition Characteristics:  Signs/Symptoms: mild fat depletion,mild muscle depletion  Nutrition Interventions:  Interventions: Ensure Enlive (each supplement provides 350kcal and 20 grams of protein),MVI   Radiology Studies: No results found.  Scheduled Meds: . acyclovir  400 mg Oral BID  . cholecalciferol  2,000 Units Oral Daily  . escitalopram  5 mg Oral Daily  . feeding supplement  237 mL Oral TID BM  . levofloxacin  500 mg Oral Daily  . multivitamin with minerals  1 tablet Oral Daily  . oxyCODONE  20 mg Oral Q12H  . pantoprazole  40 mg Oral Daily  . posaconazole  300 mg Oral Daily  . pregabalin  150 mg Oral BID  . terbinafine   Topical BID  . traZODone  50 mg Oral QHS   Continuous Infusions:   LOS: 6 days   Kerney Elbe, DO [facility] PAGER is on AMION  If 7PM-7AM, please contact night-coverage [email or web address]

## 2020-04-26 NOTE — Progress Notes (Signed)
   04/26/20 1657  Provider Notification  Provider Name/Title Lovey Newcomer  Date Provider Notified 04/26/20  Time Provider Notified [phone]  Notification Type Page  Notification Reason Critical result  Test performed and critical result HGB 6.7 & PLT 12  Date Critical Result Received 04/26/20  Time Critical Result Received 0604  Provider response Other (Comment);No new orders (No call back/ reply)   Date and time results received: 04/26/2020  Test: CBC Critical Value: HGB 6.7 and PLT 12  Name of provider notified: Lovey Newcomer NP; on call for nights 7p-7a  Orders received: No call back from provider. Patient may need a recollect due to significant drop in HGB level from 8.3 to 6.7 in 24 hours. Will notify oncoming RN of results.

## 2020-04-27 DIAGNOSIS — D701 Agranulocytosis secondary to cancer chemotherapy: Secondary | ICD-10-CM | POA: Diagnosis not present

## 2020-04-27 DIAGNOSIS — E440 Moderate protein-calorie malnutrition: Secondary | ICD-10-CM | POA: Diagnosis not present

## 2020-04-27 DIAGNOSIS — R21 Rash and other nonspecific skin eruption: Secondary | ICD-10-CM | POA: Diagnosis not present

## 2020-04-27 DIAGNOSIS — C9202 Acute myeloblastic leukemia, in relapse: Secondary | ICD-10-CM | POA: Diagnosis not present

## 2020-04-27 LAB — COMPREHENSIVE METABOLIC PANEL
ALT: 20 U/L (ref 0–44)
AST: 64 U/L — ABNORMAL HIGH (ref 15–41)
Albumin: 2.9 g/dL — ABNORMAL LOW (ref 3.5–5.0)
Alkaline Phosphatase: 210 U/L — ABNORMAL HIGH (ref 38–126)
Anion gap: 8 (ref 5–15)
BUN: 7 mg/dL — ABNORMAL LOW (ref 8–23)
CO2: 28 mmol/L (ref 22–32)
Calcium: 8.7 mg/dL — ABNORMAL LOW (ref 8.9–10.3)
Chloride: 103 mmol/L (ref 98–111)
Creatinine, Ser: 0.83 mg/dL (ref 0.44–1.00)
GFR, Estimated: >60 mL/min (ref >60)
Glucose, Bld: 100 mg/dL — ABNORMAL HIGH (ref 70–99)
Potassium: 3.8 mmol/L (ref 3.5–5.1)
Sodium: 139 mmol/L (ref 135–145)
Total Bilirubin: 0.9 mg/dL (ref 0.3–1.2)
Total Protein: 5.3 g/dL — ABNORMAL LOW (ref 6.5–8.1)

## 2020-04-27 LAB — PREPARE PLATELET PHERESIS: Unit division: 0

## 2020-04-27 LAB — CBC WITH DIFFERENTIAL/PLATELET
Abs Immature Granulocytes: 0 10*3/uL (ref 0.00–0.07)
Basophils Absolute: 0 10*3/uL (ref 0.0–0.1)
Basophils Relative: 0 %
Eosinophils Absolute: 0 10*3/uL (ref 0.0–0.5)
Eosinophils Relative: 0 %
HCT: 27.7 % — ABNORMAL LOW (ref 36.0–46.0)
Hemoglobin: 9.2 g/dL — ABNORMAL LOW (ref 12.0–15.0)
Immature Granulocytes: 0 %
Lymphocytes Relative: 14 %
Lymphs Abs: 1.4 10*3/uL (ref 0.7–4.0)
MCH: 30.9 pg (ref 26.0–34.0)
MCHC: 33.2 g/dL (ref 30.0–36.0)
MCV: 93 fL (ref 80.0–100.0)
Monocytes Absolute: 3.6 10*3/uL — ABNORMAL HIGH (ref 0.1–1.0)
Monocytes Relative: 38 %
Neutro Abs: 4.5 10*3/uL (ref 1.7–7.7)
Neutrophils Relative %: 48 %
Platelets: 17 10*3/uL — CL (ref 150–400)
RBC: 2.98 MIL/uL — ABNORMAL LOW (ref 3.87–5.11)
RDW: 16.8 % — ABNORMAL HIGH (ref 11.5–15.5)
WBC: 9.5 10*3/uL (ref 4.0–10.5)
nRBC: 0.5 % — ABNORMAL HIGH (ref 0.0–0.2)

## 2020-04-27 LAB — BPAM PLATELET PHERESIS
Blood Product Expiration Date: 202204262359
ISSUE DATE / TIME: 202204241502
Unit Type and Rh: 5100

## 2020-04-27 LAB — PHOSPHORUS: Phosphorus: 3.7 mg/dL (ref 2.5–4.6)

## 2020-04-27 LAB — MAGNESIUM: Magnesium: 1.8 mg/dL (ref 1.7–2.4)

## 2020-04-27 MED ORDER — MAGNESIUM SULFATE 2 GM/50ML IV SOLN
2.0000 g | Freq: Once | INTRAVENOUS | Status: AC
  Administered 2020-04-27: 2 g via INTRAVENOUS
  Filled 2020-04-27: qty 50

## 2020-04-27 NOTE — Progress Notes (Signed)
Chaplain engaged in an initial visit with Kelly Roberson.  Kelly Roberson shared about her healthcare journey and the upcoming decisions she needs to make.  Kelly Roberson seems to be on board with taking any aggressive measures she needs to, to continue living.  She has been in a space of figuring out what the next treatment possibility is, which may include a bone marrow transplant.  She is aware of how hard and painful that procedure will be on her and the donor. Kelly Roberson also expressed not wanting to go back to [place].  She is looking forward to hearing from the doctors working with her now and seeing what possibilities exist for her.  Chaplain could assess that the waiting period, loss of control, and not knowing what is to come has left Kelly Roberson emotionally and mentally drained.  She was tearful during our time together.  Chaplain worked to normalize her emotions as she figures out what is next.  Kelly Roberson spent time talking about her granddaughter and having one grandchild on the way.  She desires to still be living to be there for them.  She also talked about her sons who are heavily involved in her medical care.  Chaplain affirmed Kelly Roberson's desire to keep living and the options she seems to have to still fight.   Chaplain also affirmed how empowered she is to make whatever decisions feels right for her.  Chaplain offered prayer over Kelly Roberson.  Chaplain offered listening, presence and support.  Chaplain will continue to follow-up.    04/27/20 1000  Clinical Encounter Type  Visited With Patient  Visit Type Initial;Spiritual support  Stress Factors  Patient Stress Factors Major life changes;Lack of caregivers;Loss of control

## 2020-04-27 NOTE — Progress Notes (Signed)
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 Found to have pancytopenia and peripheral blasts with intracellular inclusions.  2) 05/01/2019: transferred to [facility] 3) 05/04/2019: patient started induction therapy for AML with 7+3 and gemtuzumab on Day 4. 4) 05/16/2019: repeat Bmbx shows no residual leukemia 5) 06/17/2019: patient returned for Cycle 1 Consolidation with cytarabine and gemtuzumab 6) 06/24/2019: presents to reconnect with [facility] for co-managed care. 7) 07/15/2019: Cycle 2 of consolidation therapy  8) 07/30/2019: admitted for BRBPR/ Infectious colitis.  9) 08/26/2019: start of Cycle 3 of consolidation therapy at [facility]  10) 09/09/2019: admitted withneutropenicfever and BRBPR 11) 10/23/2019: evaluated by Dr. Elmo Putt. After discussion with him decided against oral azacitidine therapy 12) 02/17/2020-04/15/2020: admitted to [facility]. Treated with Decitabine/Venetoclax x 2 cycles  Admission History: Nykerria Macconnell 60 y.o. female with medical history significant for AML who was admitted for worsening weakness and severe cytopenias.  Her cancer is found to be rapidly progressing and her blood counts are not able to be kept above transfusion threshold.  Interval History:  --blast count rising, Hgb and Plt remain low --discussed her care with Dr. Elmo Putt at [facility]. No further options available --talked with patient about the lack of options moving forward. She is having trouble processing this --discussed steps moving forward with her son Darnelle Maffucci.   MEDICAL HISTORY:  Past Medical History:  Diagnosis Date  . Anxiety   . Cancer ([place])   . Hepatitis B   .  Hepatitis C   . Hypertension   . IBS (irritable bowel syndrome)   . RLS (restless legs syndrome) 01/09/2020    SURGICAL HISTORY: Past Surgical History:  Procedure Laterality Date  . GASTRIC BYPASS  2013  . NISSEN FUNDOPLICATION  4854   Reversed in 2013  . ORIF ANKLE FRACTURE Left 06/2017  . PLACEMENT OF BREAST IMPLANTS      SOCIAL HISTORY: Social History   Socioeconomic History  . Marital status: Divorced    Spouse name: Not on file  . Number of children: Not on file  . Years of education: Not on file  . Highest education level: Not on file  Occupational History  . Occupation: disability  Tobacco Use  . Smoking status: Former Research scientist (life sciences)  . Smokeless tobacco: Never Used  Vaping Use  . Vaping Use: Never used  Substance and Sexual Activity  . Alcohol use: Not Currently    Comment: occasionally   . Drug use: Not Currently  . Sexual activity: Not Currently  Other Topics Concern  . Not on file  Social History Narrative   Lives alone   Right Handed   Drinks 2-3 cups caffeine daily   Social Determinants of Health   Financial Resource Strain: Not on file  Food Insecurity: Not on file  Transportation Needs: Not on file  Physical Activity: Not on file  Stress: Not on file  Social Connections: Not on file  Intimate Partner Violence: Not on file    FAMILY HISTORY: Family History  Problem Relation Age of Onset  . Pancreatic cancer Father   . Colon cancer Maternal Aunt   . Diabetes Maternal Uncle   . Pancreatic cancer Maternal Grandfather     ALLERGIES:  is allergic to ace inhibitors, lisinopril, and tylenol [  acetaminophen].  MEDICATIONS:  Current Facility-Administered Medications  Medication Dose Route Frequency Provider Last Rate Last Admin  . acyclovir (ZOVIRAX) tablet 400 mg  400 mg Oral BID Little Ishikawa, MD   400 mg at 04/27/20 1016  . ALPRAZolam Duanne Moron) tablet 0.5 mg  0.5 mg Oral QHS PRN Little Ishikawa, MD   0.5 mg at 04/26/20 0234  .  cholecalciferol (VITAMIN D) tablet 2,000 Units  2,000 Units Oral Daily Little Ishikawa, MD   2,000 Units at 04/27/20 1016  . escitalopram (LEXAPRO) tablet 5 mg  5 mg Oral Daily Little Ishikawa, MD   5 mg at 04/27/20 1016  . feeding supplement (ENSURE ENLIVE / ENSURE PLUS) liquid 237 mL  237 mL Oral TID BM Little Ishikawa, MD   237 mL at 04/27/20 1021  . levofloxacin (LEVAQUIN) tablet 500 mg  500 mg Oral Daily Mignon Pine, DO   500 mg at 04/27/20 1016  . multivitamin with minerals tablet 1 tablet  1 tablet Oral Daily Little Ishikawa, MD   1 tablet at 04/27/20 1016  . ondansetron (ZOFRAN) injection 4 mg  4 mg Intravenous Q6H PRN Opyd, Ilene Qua, MD   4 mg at 04/25/20 0930  . oxyCODONE (Oxy IR/ROXICODONE) immediate release tablet 5 mg  5 mg Oral Q4H PRN Raiford Noble [place], DO   5 mg at 04/27/20 0304  . oxyCODONE (OXYCONTIN) 12 hr tablet 20 mg  20 mg Oral Q12H Little Ishikawa, MD   20 mg at 04/27/20 1016  . pantoprazole (PROTONIX) EC tablet 40 mg  40 mg Oral Daily Little Ishikawa, MD   40 mg at 04/27/20 1016  . posaconazole (NOXAFIL) delayed-release tablet 300 mg  300 mg Oral Daily Little Ishikawa, MD   300 mg at 04/27/20 1018  . pregabalin (LYRICA) capsule 150 mg  150 mg Oral BID Little Ishikawa, MD   150 mg at 04/27/20 1016  . terbinafine (LAMISIL) 1 % cream   Topical BID Mignon Pine, DO   Given at 04/27/20 1022  . traZODone (DESYREL) tablet 50 mg  50 mg Oral QHS Little Ishikawa, MD   50 mg at 04/25/20 0010    REVIEW OF SYSTEMS:   Constitutional: ( - ) fevers, ( - )  chills , ( - ) night sweats Eyes: ( - ) blurriness of vision, ( - ) double vision, ( - ) watery eyes Ears, nose, mouth, throat, and face: ( - ) mucositis, ( - ) sore throat Respiratory: ( - ) cough, ( - ) dyspnea, ( - ) wheezes Cardiovascular: ( - ) palpitation, ( - ) chest discomfort, ( - ) lower extremity swelling Gastrointestinal:  ( - ) nausea, ( - ) heartburn, ( - ) change  in bowel habits Skin: ( - ) abnormal skin rashes Lymphatics: ( - ) new lymphadenopathy, ( - ) easy bruising Neurological: ( - ) numbness, ( - ) tingling, ( - ) new weaknesses Behavioral/Psych: ( - ) mood change, ( - ) new changes  All other systems were reviewed with the patient and are negative.  PHYSICAL EXAMINATION:  Vitals:   04/27/20 0406 04/27/20 1240  BP: 90/60 (!) 121/58  Pulse: 86 82  Resp: 16 16  Temp: 98.2 F (36.8 C) 98.9 F (37.2 C)  SpO2: 90% 98%   Filed Weights   04/20/20 1500  Weight: 185 lb 3 oz (84 kg)    GENERAL: chronically ill appearing middle aged  Caucasian female, alert, no distress and comfortable SKIN: splotchy violaceous lesion on lower extremities bilaterally.  EYES: conjunctiva are pink and non-injected, sclera clear LUNGS: clear to auscultation and percussion with normal breathing effort HEART: regular rate & rhythm and no murmurs and no lower extremity edema Musculoskeletal: no cyanosis of digits and no clubbing  PSYCH: alert & oriented x 3, fluent speech NEURO: no focal motor/sensory deficits  LABORATORY DATA:  I have reviewed the data as listed CBC Latest Ref Rng & Units 04/27/2020 04/26/2020 04/25/2020  WBC 4.0 - 10.5 K/uL 9.5 6.6 5.1  Hemoglobin 12.0 - 15.0 g/dL 9.2(L) 6.7(LL) 8.3(L)  Hematocrit 36.0 - 46.0 % 27.7(L) 20.9(L) 25.4(L)  Platelets 150 - 400 K/uL 17(LL) 12(LL) 20(LL)    CMP Latest Ref Rng & Units 04/27/2020 04/26/2020 04/25/2020  Glucose 70 - 99 mg/dL 100(H) 99 140(H)  BUN 8 - 23 mg/dL 7(L) <5(L) <5(L)  Creatinine 0.44 - 1.00 mg/dL 0.83 0.73 0.72  Sodium 135 - 145 mmol/L 139 144 140  Potassium 3.5 - 5.1 mmol/L 3.8 3.9 3.6  Chloride 98 - 111 mmol/L 103 105 102  CO2 22 - 32 mmol/L 28 30 26   Calcium 8.9 - 10.3 mg/dL 8.7(L) 9.0 8.9  Total Protein 6.5 - 8.1 g/dL 5.3(L) 5.0(L) 5.4(L)  Total Bilirubin 0.3 - 1.2 mg/dL 0.9 0.5 0.8  Alkaline Phos 38 - 126 U/L 210(H) 185(H) 159(H)  AST 15 - 41 U/L 64(H) 54(H) 48(H)  ALT 0 - 44 U/L 20  15 15     Lab Results  Component Value Date   MPROTEIN Not Observed 01/10/2020   No results found for: KPAFRELGTCHN, LAMBDASER, KAPLAMBRATIO   BLOOD FILM: 04/24/2020: Review of the peripheral blood smear showed few white cells with most being blast cells. Red cells show no anisopoikilocytosis, macrocytes , microcytes or polychromasia. There were no schistocytes, target cells, echinocytes, acanthocytes, dacrocytes, or stomatocytes.There was no rouleaux formation, nucleated red cells, or intra-cellular inclusions noted. The platelets are normal in size, shape, and color without any clumping evident.  RADIOGRAPHIC STUDIES: I have personally reviewed the radiological images as listed and agreed with the findings in the report. DG Shoulder Right  Result Date: 04/16/2020 CLINICAL DATA:  Fall, pain, bruising EXAM: RIGHT SHOULDER - 2+ VIEW; RIGHT HUMERUS - 2+ VIEW; RIGHT FOREARM - 2 VIEW COMPARISON:  None. FINDINGS: No fracture or dislocation of the right shoulder. Mild acromioclavicular and glenohumeral arthrosis. The partially included right chest is unremarkable. No fracture or dislocation of the right humerus. No fracture or dislocation of the right radius or ulna. IMPRESSION: 1. No fracture or dislocation of the right shoulder. Mild acromioclavicular and glenohumeral arthrosis. 2. No fracture or dislocation of the right humerus. 3. No fracture or dislocation of the right radius or ulna. Electronically Signed   By: Eddie Candle M.D.   On: 04/16/2020 15:51   DG Forearm Right  Result Date: 04/16/2020 CLINICAL DATA:  Fall, pain, bruising EXAM: RIGHT SHOULDER - 2+ VIEW; RIGHT HUMERUS - 2+ VIEW; RIGHT FOREARM - 2 VIEW COMPARISON:  None. FINDINGS: No fracture or dislocation of the right shoulder. Mild acromioclavicular and glenohumeral arthrosis. The partially included right chest is unremarkable. No fracture or dislocation of the right humerus. No fracture or dislocation of the right radius or ulna.  IMPRESSION: 1. No fracture or dislocation of the right shoulder. Mild acromioclavicular and glenohumeral arthrosis. 2. No fracture or dislocation of the right humerus. 3. No fracture or dislocation of the right radius or ulna. Electronically Signed  By: Eddie Candle M.D.   On: 04/16/2020 15:51   CT Head Wo Contrast  Result Date: 04/16/2020 CLINICAL DATA:  Fall, hematoma to right eyebrow EXAM: CT HEAD WITHOUT CONTRAST TECHNIQUE: Contiguous axial images were obtained from the base of the skull through the vertex without intravenous contrast. COMPARISON:  None. FINDINGS: Brain: No evidence of acute infarction, hemorrhage, hydrocephalus, extra-axial collection or mass lesion/mass effect. Vascular: No hyperdense vessel or unexpected calcification. Skull: Normal. Negative for fracture or focal lesion. Sinuses/Orbits: No acute finding. Other: Soft tissue contusion of the right brow (series 3, image 5). IMPRESSION: 1. No acute intracranial pathology. 2. Soft tissue contusion of the right brow. Electronically Signed   By: Eddie Candle M.D.   On: 04/16/2020 15:06   DG Humerus Right  Result Date: 04/16/2020 CLINICAL DATA:  Fall, pain, bruising EXAM: RIGHT SHOULDER - 2+ VIEW; RIGHT HUMERUS - 2+ VIEW; RIGHT FOREARM - 2 VIEW COMPARISON:  None. FINDINGS: No fracture or dislocation of the right shoulder. Mild acromioclavicular and glenohumeral arthrosis. The partially included right chest is unremarkable. No fracture or dislocation of the right humerus. No fracture or dislocation of the right radius or ulna. IMPRESSION: 1. No fracture or dislocation of the right shoulder. Mild acromioclavicular and glenohumeral arthrosis. 2. No fracture or dislocation of the right humerus. 3. No fracture or dislocation of the right radius or ulna. Electronically Signed   By: Eddie Candle M.D.   On: 04/16/2020 15:51   US PELVIC COMPLETE WITH TRANSVAGINAL  Result Date: 04/23/2020 CLINICAL DATA:  Vaginal bleeding EXAM: TRANSABDOMINAL AND  TRANSVAGINAL ULTRASOUND OF PELVIS TECHNIQUE: Both transabdominal and transvaginal ultrasound examinations of the pelvis were performed. Transabdominal technique was performed for global imaging of the pelvis including uterus, ovaries, adnexal regions, and pelvic cul-de-sac. It was necessary to proceed with endovaginal exam following the transabdominal exam to attempt to visualize the uterus endometrium ovaries. COMPARISON:  CT 09/09/2019 FINDINGS: Uterus Transvaginal images or markedly limited by patient pain tolerance and shadowing. Measurements: 8.9 x 4.5 x 5.8 cm = volume: 114.7 mL. Diffuse echogenic foci throughout the myometrium. Endometrium Thickness: 4.4 mm.  No focal abnormality visualized. Right ovary Measurements: 2.5 x 2.1 x 2 cm = volume: 5.4 mL. Normal appearance/no adnexal mass. Left ovary Measurements: 2.6 x 2.1 x 2.2 cm = volume: 6.3 mL. Normal appearance/no adnexal mass. Other findings No abnormal free fluid. IMPRESSION: 1. Limited study. Transvaginal images of limited utility due to pain. 2. Post menopausal endometrial thickness grossly within normal limits. 3. Heterogeneous myometrial echotexture with numerous echogenic foci indeterminate for microcalcifications from prior inflammation or infection versus vascular calcification versus echogenic foci associated with adenomyosis though no adequate transvaginal images are available. Depending upon clinical course, evaluation with pelvic MRI could be considered. Electronically Signed   By: Donavan Foil M.D.   On: 04/23/2020 19:01    ASSESSMENT & PLAN Samaree Kinner 60 y.o. female with medical history significant for AML who was admitted for worsening weakness and severe cytopenias.  # Increasing Blast Count # Progression of Leukemia # Lower Extremity Rash, potentially Leukemia cutis -- Unfortunately her CBC shows increasing blasts which is a major driving force behind her increasing white blood cell count -- Findings are strongly consistent  with progression of disease.  Discussed her care with Dr. Lysle [facility].  He emphasized that there were no additional treatments available for this patient and that hospice referral was most appropriate. -- no other options are available, we will need to consider comfort  based care/hospice -- Findings on lower extremities may represent leukemia cutis with deposition of the blast cells within the skin. -- I discussed the lack of treatment options moving forward with the patient and her son.  The son noted that they have been warned by Dr. Jeannie Done prior to her last round of treatment that there would be no further options available afterwards.  Ms. Blizard is still wanting to consider supportive care with transfusions over the prolong her life. --recommend consultation with palliative care, begin process of goals of care transfer to hospice.  -- I suspect that the patient's blood counts will not stabilize as the driving force is her rapidly increasing leukemia.  She would likely require near daily transfusion support until she is transition to hospice care. -- Hematology will continue to follow closely.  # Weakness/Deconditioning #Fall From Standing --Patient was taken to the emergency room on 04/16/2020 for a fall from standing with trauma to the head. --This occurred 1 day after discharge from [facility].  Her admission there lasted approximately 2 months. --continue PT and evaluation for consideration of rehab facility. -- We admitted her directly from clinic up to the 6th floor of [facility].  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 Elmo Putt providing recommendations. We are happy to provide blood checks and treatment for urgent issues locally. --patient completed induction and 3 cycles of consolidation therapy. Unfortunately she had relapsed  disease and was treated with decitabine/Venetoclax in inpatient setting, with undetectable blasts at end of treatment.  -- Her Hgb and Plt are quite low. Transfusion threshold Hgb <7.0 or Plt <10  All questions were answered. The patient knows to call the clinic with any problems, questions or concerns.  A total of more than 30 minutes were spent on this encounter and over half of that time was spent on counseling and coordination of care as outlined above.   Ledell Peoples, MD Department of Hematology/Oncology [place] at [facility] Phone: [phone] Pager: [phone] Email: [email or web address]  04/27/2020 4:42 PM

## 2020-04-27 NOTE — Progress Notes (Signed)
PROGRESS NOTE    Kelly Roberson  JSE:[id number] DOB: 1960/12/14 DOA: 04/20/2020 PCP: Kristie Cowman, MD   Brief Narrative:  HPI per Dr. Holli Humbles on 04/20/20 Kelly Roberson a 60 y.o.femalewith medical history significant ofAcute Myeloid leukemia having been admitted to [facility] for the last 60 days. Was discharged home for 24h before she presented to oncology for further evaluation for ongoing weakness. She declined admission to hospital at that point but now presents again to oncology for ongoing weakness who is requesting admission to the hospital. Comorbid conditions include - HTN, Anxiety, Hepatitis B/C, IBS, RLS.   Timeline of events per oncology follow up: 1) 04/30/2019: patient presented to [facility] ED with progressive weakness. Found to have pancytopenia and peripheral blasts with intracellular inclusions.  2) 05/01/2019: transferred to [facility] 3) 05/04/2019: patient started induction therapy for AML with 7+3 and gemtuzumab on Day 4. 4) 05/16/2019: repeat Bmbx shows no residual leukemia 5) 06/17/2019: patient returned for Cycle 1 Consolidation with cytarabine and gemtuzumab 6) 06/24/2019: presents to reconnect with [facility] for co-managed care. 7) 07/15/2019: Cycle 2 of consolidation therapy  8) 07/30/2019: admitted for BRBPR/ Infectious colitis.  9) 08/26/2019: start of Cycle 3 of consolidation therapy at [facility]  10) 09/09/2019: admitted withneutropenicfever and BRBPR 11) 10/23/2019: evaluated by Dr. Elmo Putt. After discussion with him decided against oral azacitidine therapy 12) 02/17/2020-04/15/2020: admitted to [facility]. Treated with Decitabine/Venetoclax x 2 cycles 13) Presented to [facility] ED 04/16/20 for weakness/fall/ambulatory dysfunction. Refused admission and returned home despite discussion with ED and Oncology physician. 14) Presents to oncology 04/20/20 in the office for ongoing weakness and increased falls- admitted directly to hospital for PT evaluation and  platelet transfusion to be evaluated for placement. 15) 04/23/20 states she has Vaginal Bleeding now. Will transfuse 1 pack of platelets and asked ID to evaluate lesions on her legs as well. Transvaginal U/S ordered   **Interim History  PT evaluating and recommending CIR but she had Critically low WBC and Platelets which were contraindications to aggressive Rehab. CIR admission denied and recommendation was for SNF.  Her electrolytes are being repleted.  She developed a rash on her mid thighs and abdomen and then has lower extremity violaceous color flat lesions that appear to be microhemorrhages from low platelet count.  ID was consulted given that the proximal lesions look like ringworm and they have added topical terbinafine.  The day before she complained about having some vaginal bleeding however this may have been secondary to her mesh panties causing some irritation and some mild bleeding.  Platelet count has been improved with another unit and her vaginal bleeding is resolved.  Oncology reevaluated and reviewed her CBC and it shows 33% blasts which they think is a major driving force behind her increasing WBC count.  They are concerned with progression of the disease and [name] is to speak with Dr. Everlena Cooper at [facility] to see if there is any other options available for this patient.  If there is no ocular options she will need to consider comfort based/hospice.  [name] feels that the lower extremity findings may represent leukemic cutis with deposition of blast cells within the skin.  Medical oncology recommends the patient continue to work with physical therapy over the weekend with consideration to transfer to [facility] or [facility] early next week in the event that she does have further treatment options available.  On 04/26/2020 her blood count dropped to 6.7 and platelet count was 12 so  she is typed and screened and transfused 1 unit of PRBCs as well as 1 unit of  platelets.  Patient is all frustrated and wants to speak with her oncologist about further plan of care and direction of treatment.  Today her blood count improved and her hemoglobin was 9.2 and platelet count marginally improved to 17.  [name] spoke with Dr. Elmo Putt and there were no additional treatments available for this patient and they are now recommending hospice referral.  Given that there is no other options available we will need to consider comfort based care and hospice.  We will consult palliative care now and [name] suspects that the patient is blood counts will not stabilize given her increasing leukemia and that she will likely require daily transfusion support until she transitions to hospice care.  Assessment & Plan:   Principal Problem:   AML (acute myeloblastic leukemia) ([place]) Active Problems:   Neutropenia (HCC)   Thrombocytopenia (HCC)   Symptomatic anemia   Malnutrition of moderate degree   Rash and nonspecific skin eruption  Ambulatory Dysfunction, mechanical fall - Patient was taken to the emergency room on 04/16/2020 for a fall from standing with trauma to the head - negative workup at that time. - This occurred 1 day after discharge from [facility]. Her admission there lasted approximately 2 months. - Patient declined to be admitted to the hospital at that time- agreeable to admission. -PT to evaluate for possible needs/placement and they are recommending SNF and will need assistance with placement and she has bed offers; patient may be transferred to [facility] in the interim though for further treatment options for her AML pending on Dr. Libby Maw discussion with the oncologist at the tertiary care centers; she does not have any other treatment options available for oncology's discussion with the tertiary care centers and hospice is recommended  Acute on chronic thrombocytopenia; chronic pancytopenia Secondary to below -Secondary to  below -She is typed and screened and she is transfused 2 units of platelets and will get another 1 today given her platelet count of 12 -Goal hemoglobin 8, goal platelet 20 now that she has evidence of bleeding  -Yesterday her WBC was trending up to 5.1, hemoglobin/hematocrit is 8.3/24.5, platelet count is 20.  ANC is 0.3  -Today her WBC was 9.5, hemoglobin/hematocrit was 9.2/27.7 and platelet count was 17 We will type and screen and transfuse 1 unit PRBCs today as well as the platelet count as mentioned above -Oncology recommending no GCSF  -Continue to monitor and trend and repeat CBC in a.m.  -Oncology has no further treatment options for the patient and recommending supportive care with transfusions until she is ready to transfer to hospice  Hypokalemia, improved   -Unclear etiology, likely poor p.o. intake malnutrition, continue to advance diet as tolerated -K+ this AM was 3.8 and Mag level was 1.8 and will be replete -Questionable episodes of nausea and vomiting per the patient prior to admission -Replete via IV given patient's refusal to take po Pills and Liquid formulations if necessary -Mag level improving -Continue To monitor and replete as necessary -Repeat CMP in the AM   Hypomagnesemia, improved  -Patient magnesium level was 1.8 -Replete with IV mag sulfate 2 g -Continue to monitor replete as necessary -Repeat magnesium level in a.m.  Elevated AST -Mild -Patient's AST went from 21 -> 33 -> 39 -> 48 -> and today it is 64 -Continue to Monitor and Trend -Repeat CMP in the AM  Acute Myeloid Leukemia, Favorable Risk (RUNX1/RUNX1T1), likely progressing worsening -Continue f/u at [facility] with Dr. Elmo Putt. -Follows locally with Dr Lorenso Courier -twice weekly blood draws with q 2 week clinic visits. -Currently on posaconazole, acyclovir and ID has resumed her levofloxacin yesterday -Appreciate Dr. Libby Maw evaluation and Recc's; He feels that she may not have  fully been treated as she has blasts floating around in her blood and is concerned given that her CBC shows 33% blasts and [name] feels that this is the major driving force behind her increasing WBC count.  These findings are strongly consistent with progression of her disease and [name] is to reach out to Dr. Lysle Dingwall at [facility] .  Dr. Elmo Putt and he emphasized that there were no additional treatment options available for this patient and that hospice referral was most appropriate -Palliative care has been now consulted to begin the process of goals of care transfer to hospice -[name] does not suspect the patient blood counts will stabilize as a driving force of them is her rapidly increasing leukemia.  She will require likely near daily transfusion support until she is ready to transition to hospice care. -[name] feels that the lower extremity findings may represent leukemic cutis with deposition of the blast cells within the skin. -See Above   Vaginal Bleeding, ruled out -Initially thought to be vaginal bleeding but now likely secondary to irritation from mesh panties -Also will transfuse 1 pack of Platelets to try to get Platelet Goal >20 -Dr. Rip Harbour to follow up on Transvaginal U/S and make additional Recc's -Transvaginal ultrasound showed "Limited study. Transvaginal images of limited utility due to pain. Post menopausal endometrial thickness grossly within normal limits. Heterogeneous myometrial echotexture with numerous echogenic foci indeterminate for microcalcifications from prior inflammation or infection versus vascular calcification versus echogenic foci associated with adenomyosis though no adequate  transvaginal images are available. Depending upon clinical course, evaluation with pelvic MRI could be  Considered." -Now that this is resolved she will need outpatient GYN follow-up  ?Tinea Corporis vs other Rash and other violaceous lesions on the lower extremities;  concern for leukemic cutis, slowly improving -Unclear Rash on the LE that has proximal lesions thatare circular appearing erythematous plaques but she also has violaceous lesions that are flat on the lower extremities; LE Lesions could be Leukemia Cutis  -Already on Antifungals with posaconazole but have reached out to pharmacy to see if she needs a topical formulation for ringworm -I asked ID to see the patient and they have started the patient on topical terbinafine for Proximal Lesions  -Had lesions Biopsied at [facility] and was notable for spongiosis with hemorrhage but has now had progression of these new lesions on the LE with continued Neutropenia -ID feels her DDx includes Inflammatory Dermatitis with 2/2 Hemorrhage vs Infectious Etiology (Fungal, Bacterial, Viral) in the setting of Neutropenia vs. Sweet Syndrome vs. Leukemia Cutis -ID discussing with Dermatology and pleased to have an unclear etiology of her lower extremity lesions but it could be that she has 2 different things going on.  Dr. Juleen China informally discussed with dermatology and they feel that the lower lesions could be capillaritis with micro hemorrhaging due to severe thrombocytopenia and less likely Sweet syndrome, leukemic cutis or atypical infection given that the lesions are flat.  She does have a left lateral calf area with some necrosis that needs to be closely monitored that represents bullous sweets or atypical fungal infection but will continue to monitor see if  this improves his with the improvement in her thrombocytopenia -The proximal lesions are more consistent with a tinea infection or an irritant dermatitis and they are going to add topical terbinafine for 2 weeks -If lesions do not improve or progress she will require another biopsy with AFB, fungal, bacterial cultures and pathology to help determine etiology -Medical oncology evaluated and feels that this is consistent with leukemic cutis given her 33% blast cells on her  CBC -WOC nurse consulted to remove stitch from prior Bx site -Patient's thinks that her lesions are improving with Terbinafine  DVT prophylaxis: SCDs given thrombocytopenia Code Status: FULL CODE  Family Communication: No family present at bedside  Disposition Plan: Anticipating D/C to SNF when medically stable and cleared by ID and Oncology but may need to be transferred to [facility] for Progression of AML   Status is: Inpatient  Remains inpatient appropriate because:Unsafe d/c plan, IV treatments appropriate due to intensity of illness or inability to take PO and Inpatient level of care appropriate due to severity of illness   Dispo:  Patient From: Home  Planned Disposition: [place] vs Transfer to [facility]  Medically stable for discharge: Yes     Consultants:   Medical Oncology  Infectious Diseases  Spoke to OB-GYN Dr. Arlina Robes  Palliative Care Medicine   Procedures: None  Antimicrobials:  Anti-infectives (From admission, onward)   Start     Dose/Rate Route Frequency Ordered Stop   04/23/20 1415  levofloxacin (LEVAQUIN) tablet 500 mg        500 mg Oral Daily 04/23/20 1318     04/21/20 1000  acyclovir (ZOVIRAX) tablet 400 mg        400 mg Oral 2 times daily 04/21/20 0750     04/21/20 1000  posaconazole (NOXAFIL) delayed-release tablet 300 mg        300 mg Oral Daily 04/21/20 0750          Subjective: Seen and examined at bedside this morning and she was thinking that her lesions are improving in her legs.  No nausea or vomiting.  Still feels very weak.  Wanted know what the object was with discussion with the specialist in the tertiary care center.  No nausea or vomiting.  No other concerns or complaints at this time.  Objective: Vitals:   04/27/20 0041 04/27/20 0121 04/27/20 0406 04/27/20 1240  BP: (!) 96/54 (!) 118/55 90/60 (!) 121/58  Pulse: 75 80 86 82  Resp: 15 17 16 16   Temp: 98.7 F (37.1 C) 97.7 F (36.5 C)  98.2 F (36.8 C) 98.9 F (37.2 C)  TempSrc: Oral Oral Oral Oral  SpO2: 95% 99% 90% 98%  Weight:      Height:        Intake/Output Summary (Last 24 hours) at 04/27/2020 1704 Last data filed at 04/27/2020 1329 Gross per 24 hour  Intake 1082.67 ml  Output --  Net 1082.67 ml   Filed Weights   04/20/20 1500  Weight: 84 kg   Examination: Physical Exam:  Constitutional: WN/WD chronically ill-appearing Caucasian female currently in no acute distress appears very weak Eyes: Lids and conjunctivae normal, sclerae anicteric  ENMT: External Ears, Nose appear normal. Grossly normal hearing Neck: Appears normal, supple, no cervical masses, normal ROM, no appreciable thyromegaly; no JVD Respiratory: Diminished to auscultation bilaterally, no wheezing, rales, rhonchi or crackles. Normal respiratory effort and patient is not tachypenic. No accessory muscle use.  Unlabored breathing and not wearing supplemental  oxygen nasal cannula Cardiovascular: RRR, no murmurs / rubs / gallops. S1 and S2 auscultated.  1+ lower extremity edema Abdomen: Soft, non-tender, distended secondary body habitus. Bowel sounds positive.  GU: Deferred. Musculoskeletal: No clubbing / cyanosis of digits/nails. No joint deformity upper and lower extremities.  Skin: Has 2 different lower extremity lesions which are both improving. No induration; Warm and dry.  Neurologic: CN 2-12 grossly intact with no focal deficits. Romberg sign and cerebellar reflexes not assessed.  Psychiatric: Normal judgment and insight. Alert and oriented x 3.  Anxious mood and appropriate affect.   Data Reviewed: I have personally reviewed following labs and imaging studies  CBC: Recent Labs  Lab 04/23/20 0752 04/24/20 0542 04/25/20 0606 04/26/20 0546 04/27/20 0451  WBC 2.5* 4.4 5.1 6.6 9.5  NEUTROABS 0.2* 0.1* 0.3*  --  4.5  HGB 8.3* 7.9* 8.3* 6.7* 9.2*  HCT 26.3* 24.8* 25.4* 20.9* 27.7*  MCV 96.7 95.8 95.5 95.9 93.0  PLT 13* 28* 20* 12* 17*    Basic Metabolic Panel: Recent Labs  Lab 04/23/20 0752 04/24/20 0542 04/25/20 0606 04/26/20 0546 04/27/20 0451  NA 143 143 140 144 139  K 3.9 3.5 3.6 3.9 3.8  CL 105 105 102 105 103  CO2 26 29 26 30 28   GLUCOSE 100* 127* 140* 99 100*  BUN <5* <5* <5* <5* 7*  CREATININE 0.73 0.79 0.72 0.73 0.83  CALCIUM 8.7* 8.7* 8.9 9.0 8.7*  MG 2.0 1.6* 1.8 1.9 1.8  PHOS 2.6 2.9 2.7 3.1 3.7   GFR: Estimated Creatinine Clearance: 80.8 mL/min (by C-G formula based on SCr of 0.83 mg/dL). Liver Function Tests: Recent Labs  Lab 04/23/20 0752 04/24/20 0542 04/25/20 0606 04/26/20 0546 04/27/20 0451  AST 33 39 48* 54* 64*  ALT 11 14 15 15 20   ALKPHOS 142* 150* 159* 185* 210*  BILITOT 1.0 0.9 0.8 0.5 0.9  PROT 5.3* 5.2* 5.4* 5.0* 5.3*  ALBUMIN 3.0* 2.9* 2.9* 2.6* 2.9*   No results for input(s): LIPASE, AMYLASE in the last 168 hours. No results for input(s): AMMONIA in the last 168 hours. Coagulation Profile: Recent Labs  Lab 04/21/20 0606  INR 1.3*   Cardiac Enzymes: No results for input(s): CKTOTAL, CKMB, CKMBINDEX, TROPONINI in the last 168 hours. BNP (last 3 results) No results for input(s): PROBNP in the last 8760 hours. HbA1C: No results for input(s): HGBA1C in the last 72 hours. CBG: No results for input(s): GLUCAP in the last 168 hours. Lipid Profile: No results for input(s): CHOL, HDL, LDLCALC, TRIG, CHOLHDL, LDLDIRECT in the last 72 hours. Thyroid Function Tests: No results for input(s): TSH, T4TOTAL, FREET4, T3FREE, THYROIDAB in the last 72 hours. Anemia Panel: No results for input(s): VITAMINB12, FOLATE, FERRITIN, TIBC, IRON, RETICCTPCT in the last 72 hours. Sepsis Labs: No results for input(s): PROCALCITON, LATICACIDVEN in the last 168 hours.  Recent Results (from the past 240 hour(s))  SARS CORONAVIRUS 2 (TAT 6-24 HRS) Nasopharyngeal Nasopharyngeal Swab     Status: None   Collection Time: 04/21/20  1:37 AM   Specimen: Nasopharyngeal Swab  Result Value Ref Range  Status   SARS Coronavirus 2 NEGATIVE NEGATIVE Final    Comment: (NOTE) SARS-CoV-2 target nucleic acids are NOT DETECTED.  The SARS-CoV-2 RNA is generally detectable in upper and lower respiratory specimens during the acute phase of infection. Negative results do not preclude SARS-CoV-2 infection, do not rule out co-infections with other pathogens, and should not be used as the sole basis for treatment or other patient  management decisions. Negative results must be combined with clinical observations, patient history, and epidemiological information. The expected result is Negative.  Fact Sheet for Patients: SugarRoll.be  Fact Sheet for Healthcare Providers: https://www.woods-mathews.com/  This test is not yet approved or cleared by the [place] FDA and  has been authorized for detection and/or diagnosis of SARS-CoV-2 by FDA under an Emergency Use Authorization (EUA). This EUA will remain  in effect (meaning this test can be used) for the duration of the COVID-19 declaration under Se ction 564(b)(1) of the Act, 21 U.S.C. section 360bbb-3(b)(1), unless the authorization is terminated or revoked sooner.  Performed at [facility], [place] [address], [place], [place] [address]     RN Pressure Injury Documentation:     Estimated body mass index is 28.16 kg/m as calculated from the following:   Height as of this encounter: 5' 7.99" (1.727 m).   Weight as of this encounter: 84 kg.  Malnutrition Type:  Nutrition Problem: Moderate Malnutrition Etiology: chronic illness,cancer and cancer related treatments  Malnutrition Characteristics:  Signs/Symptoms: mild fat depletion,mild muscle depletion  Nutrition Interventions:  Interventions: Ensure Enlive (each supplement provides 350kcal and 20 grams of protein),MVI   Radiology Studies: No results found.  Scheduled Meds: . acyclovir  400 mg Oral BID  . cholecalciferol  2,000  Units Oral Daily  . escitalopram  5 mg Oral Daily  . feeding supplement  237 mL Oral TID BM  . levofloxacin  500 mg Oral Daily  . multivitamin with minerals  1 tablet Oral Daily  . oxyCODONE  20 mg Oral Q12H  . pantoprazole  40 mg Oral Daily  . posaconazole  300 mg Oral Daily  . pregabalin  150 mg Oral BID  . terbinafine   Topical BID  . traZODone  50 mg Oral QHS   Continuous Infusions:   LOS: 7 days   Kerney Elbe, DO [facility] PAGER is on AMION  If 7PM-7AM, please contact night-coverage [email or web address]

## 2020-04-28 ENCOUNTER — Telehealth: Payer: Self-pay | Admitting: *Deleted

## 2020-04-28 DIAGNOSIS — E440 Moderate protein-calorie malnutrition: Secondary | ICD-10-CM | POA: Diagnosis not present

## 2020-04-28 DIAGNOSIS — R531 Weakness: Secondary | ICD-10-CM | POA: Diagnosis not present

## 2020-04-28 DIAGNOSIS — D701 Agranulocytosis secondary to cancer chemotherapy: Secondary | ICD-10-CM | POA: Diagnosis not present

## 2020-04-28 DIAGNOSIS — R21 Rash and other nonspecific skin eruption: Secondary | ICD-10-CM | POA: Diagnosis not present

## 2020-04-28 DIAGNOSIS — Z7189 Other specified counseling: Secondary | ICD-10-CM

## 2020-04-28 DIAGNOSIS — Z515 Encounter for palliative care: Secondary | ICD-10-CM

## 2020-04-28 DIAGNOSIS — C9202 Acute myeloblastic leukemia, in relapse: Secondary | ICD-10-CM | POA: Diagnosis not present

## 2020-04-28 DIAGNOSIS — E876 Hypokalemia: Secondary | ICD-10-CM

## 2020-04-28 LAB — COMPREHENSIVE METABOLIC PANEL
ALT: 20 U/L (ref 0–44)
AST: 69 U/L — ABNORMAL HIGH (ref 15–41)
Albumin: 3.4 g/dL — ABNORMAL LOW (ref 3.5–5.0)
Alkaline Phosphatase: 242 U/L — ABNORMAL HIGH (ref 38–126)
Anion gap: 9 (ref 5–15)
BUN: 6 mg/dL — ABNORMAL LOW (ref 8–23)
CO2: 28 mmol/L (ref 22–32)
Calcium: 9.1 mg/dL (ref 8.9–10.3)
Chloride: 103 mmol/L (ref 98–111)
Creatinine, Ser: 0.84 mg/dL (ref 0.44–1.00)
GFR, Estimated: >60 mL/min (ref >60)
Glucose, Bld: 101 mg/dL — ABNORMAL HIGH (ref 70–99)
Potassium: 3.2 mmol/L — ABNORMAL LOW (ref 3.5–5.1)
Sodium: 140 mmol/L (ref 135–145)
Total Bilirubin: 0.9 mg/dL (ref 0.3–1.2)
Total Protein: 6 g/dL — ABNORMAL LOW (ref 6.5–8.1)

## 2020-04-28 LAB — MAGNESIUM: Magnesium: 1.8 mg/dL (ref 1.7–2.4)

## 2020-04-28 LAB — CBC WITH DIFFERENTIAL/PLATELET
Abs Immature Granulocytes: 0.6 10*3/uL — ABNORMAL HIGH (ref 0.00–0.07)
Basophils Absolute: 0 10*3/uL (ref 0.0–0.1)
Basophils Relative: 0 %
Blasts: 61 %
Eosinophils Absolute: 0 10*3/uL (ref 0.0–0.5)
Eosinophils Relative: 0 %
HCT: 27.5 % — ABNORMAL LOW (ref 36.0–46.0)
Hemoglobin: 9 g/dL — ABNORMAL LOW (ref 12.0–15.0)
Lymphocytes Relative: 32 %
Lymphs Abs: 3.5 10*3/uL (ref 0.7–4.0)
MCH: 30.3 pg (ref 26.0–34.0)
MCHC: 32.7 g/dL (ref 30.0–36.0)
MCV: 92.6 fL (ref 80.0–100.0)
Monocytes Absolute: 0 10*3/uL — ABNORMAL LOW (ref 0.1–1.0)
Monocytes Relative: 0 %
Myelocytes: 5 %
Neutro Abs: 0.2 10*3/uL — CL (ref 1.7–7.7)
Neutrophils Relative %: 2 %
Platelets: 12 10*3/uL — CL (ref 150–400)
RBC: 2.97 MIL/uL — ABNORMAL LOW (ref 3.87–5.11)
RDW: 17 % — ABNORMAL HIGH (ref 11.5–15.5)
WBC: 11 10*3/uL — ABNORMAL HIGH (ref 4.0–10.5)
nRBC: 0.4 % — ABNORMAL HIGH (ref 0.0–0.2)

## 2020-04-28 LAB — BPAM RBC
Blood Product Expiration Date: 202205102359
ISSUE DATE / TIME: 202204250058
Unit Type and Rh: 6200

## 2020-04-28 LAB — TYPE AND SCREEN
ABO/RH(D): A POS
Antibody Screen: NEGATIVE
Unit division: 0

## 2020-04-28 LAB — PHOSPHORUS: Phosphorus: 3.4 mg/dL (ref 2.5–4.6)

## 2020-04-28 MED ORDER — MAGNESIUM SULFATE 2 GM/50ML IV SOLN
2.0000 g | Freq: Once | INTRAVENOUS | Status: AC
  Administered 2020-04-28: 2 g via INTRAVENOUS
  Filled 2020-04-28: qty 50

## 2020-04-28 MED ORDER — POTASSIUM CHLORIDE 10 MEQ/100ML IV SOLN
10.0000 meq | INTRAVENOUS | Status: AC
  Administered 2020-04-28 (×4): 10 meq via INTRAVENOUS
  Filled 2020-04-28 (×4): qty 100

## 2020-04-28 MED ORDER — ALPRAZOLAM 0.5 MG PO TABS
0.5000 mg | ORAL_TABLET | Freq: Three times a day (TID) | ORAL | Status: DC | PRN
  Administered 2020-04-28 – 2020-04-29 (×2): 0.5 mg via ORAL
  Filled 2020-04-28 (×2): qty 1

## 2020-04-28 NOTE — Telephone Encounter (Signed)
Patient called left message.  States that she has spoken to everyone that she would need to speak to and wanted to talk to [name] one last time to see if anything becomes of it.  She states she was given 4-6 weeks left.     Routed to Dr Lorenso Courier and Eustaquio Maize RN

## 2020-04-28 NOTE — Progress Notes (Signed)
PROGRESS NOTE    Kelly Roberson  ZHG:[id number] DOB: March 31, 1960 DOA: 04/20/2020 PCP: Kristie Cowman, MD   Brief Narrative:  HPI per Dr. Holli Humbles on 04/20/20 Kelly Roberson a 60 y.o.femalewith medical history significant ofAcute Myeloid leukemia having been admitted to [facility] for the last 60 days. Was discharged home for 24h before she presented to oncology for further evaluation for ongoing weakness. She declined admission to hospital at that point but now presents again to oncology for ongoing weakness who is requesting admission to the hospital. Comorbid conditions include - HTN, Anxiety, Hepatitis B/C, IBS, RLS.   Timeline of events per oncology follow up: 1) 04/30/2019: patient presented to [facility] ED with progressive weakness. Found to have pancytopenia and peripheral blasts with intracellular inclusions.  2) 05/01/2019: transferred to [facility] 3) 05/04/2019: patient started induction therapy for AML with 7+3 and gemtuzumab on Day 4. 4) 05/16/2019: repeat Bmbx shows no residual leukemia 5) 06/17/2019: patient returned for Cycle 1 Consolidation with cytarabine and gemtuzumab 6) 06/24/2019: presents to reconnect with [facility] for co-managed care. 7) 07/15/2019: Cycle 2 of consolidation therapy  8) 07/30/2019: admitted for BRBPR/ Infectious colitis.  9) 08/26/2019: start of Cycle 3 of consolidation therapy at [facility]  10) 09/09/2019: admitted withneutropenicfever and BRBPR 11) 10/23/2019: evaluated by Dr. Elmo Putt. After discussion with him decided against oral azacitidine therapy 12) 02/17/2020-04/15/2020: admitted to [facility]. Treated with Decitabine/Venetoclax x 2 cycles 13) Presented to [facility] ED 04/16/20 for weakness/fall/ambulatory dysfunction. Refused admission and returned home despite discussion with ED and Oncology physician. 14) Presents to oncology 04/20/20 in the office for ongoing weakness and increased falls- admitted directly to hospital for PT evaluation and  platelet transfusion to be evaluated for placement. 15) 04/23/20 states she has Vaginal Bleeding now. Will transfuse 1 pack of platelets and asked ID to evaluate lesions on her legs as well. Transvaginal U/S ordered   **Interim History  PT evaluating and recommending CIR but she had Critically low WBC and Platelets which were contraindications to aggressive Rehab. CIR admission denied and recommendation was for SNF.  Her electrolytes are being repleted.  She developed a rash on her mid thighs and abdomen and then has lower extremity violaceous color flat lesions that appear to be microhemorrhages from low platelet count.  ID was consulted given that the proximal lesions look like ringworm and they have added topical terbinafine.  The day before she complained about having some vaginal bleeding however this may have been secondary to her mesh panties causing some irritation and some mild bleeding.  Platelet count has been improved with another unit and her vaginal bleeding is resolved.  On 04/26/2020 her blood count dropped to 6.7 and platelet count was 12 so she is typed and screened and transfused 1 unit of PRBCs as well as 1 unit of platelets.  Patient is all frustrated and wants to speak with her oncologist about further plan of care and direction of treatment.  Yesterday her blood count improved and her hemoglobin was 9.2 and platelet count marginally improved to 17.  [name] spoke with Dr. Elmo Putt and since there were no additional treatments available for this patient and they are now recommending hospice referral.  Given that there is no other options available we will need to consider comfort based care and hospice.  We will consult palliative care now and [name] suspects that the patient is blood counts will not stabilize given her increasing leukemia and that she will likely require daily transfusion support  until she transitions to hospice care.  Assessment & Plan:   Principal Problem:    AML (acute myeloblastic leukemia) ([place]) Active Problems:   Neutropenia (HCC)   Thrombocytopenia (HCC)   Symptomatic anemia   Malnutrition of moderate degree   Rash and nonspecific skin eruption  Ambulatory Dysfunction, mechanical fall - Patient was taken to the emergency room on 04/16/2020 for a fall from standing with trauma to the head - negative workup at that time. - This occurred 1 day after discharge from [facility]. Her admission there lasted approximately 2 months. - Patient declined to be admitted to the hospital at that time- agreeable to admission. -PT to evaluate for possible needs/placement and they are recommending SNF and will need assistance with placement and she has bed offers; patient may be transferred to [facility] in the interim though for further treatment options for her AML pending on Dr. Libby Maw discussion with the oncologist at the tertiary care centers; she does not have any other treatment options available for oncology's discussion with the tertiary care centers and hospice is recommended  Acute on chronic thrombocytopenia; chronic pancytopenia Secondary to below -Secondary to below -She is typed and screened and she is transfused 3 units of platelets and 1 unit of blood during hospitalization -Goal hemoglobin 8, goal platelet greater than 10 now that she has no evidence of bleeding  -Today her WBC was 11.0, hemoglobin/hematocrit was 9.0/27.5 and platelet count was 12 -Oncology recommending no GCSF  -Continue to monitor and trend and repeat CBC in a.m.  -Oncology has no further treatment options for the patient and recommending supportive care with transfusions until she is ready to transfer to hospice; they feel that her elevated counts are related to her leukemia and they feel that this is the cause of behind her increasing WBC  Hypokalemia, improved   -Unclear etiology, likely poor p.o. intake malnutrition, continue to advance diet as  tolerated -K+ this AM was 3.2 and Mag level was 1.8 and will be replete -Questionable episodes of nausea and vomiting per the patient prior to admission -Replete via IV given patient's refusal to take po Pills and Liquid formulations if necessary -Mag level improving -Continue To monitor and replete as necessary -Repeat CMP in the AM   Hypomagnesemia, improved  -Patient magnesium level was 1.8 -Replete with IV mag sulfate 2 g -Continue to monitor replete as necessary -Repeat magnesium level in a.m.  Elevated AST -Mild -Patient's AST went from 21 -> 33 -> 39 -> 48 -> 64 -> 69 -Continue to Monitor and Trend -Repeat CMP in the AM   Acute Myeloid Leukemia, Favorable Risk (RUNX1/RUNX1T1), likely progressing and worsening -Continue f/u at [facility] with Dr. Elmo Putt. -Follows locally with Dr Lorenso Courier -twice weekly blood draws with q 2 week clinic visits. -Currently on posaconazole, acyclovir and ID has resumed her levofloxacin yesterday -Appreciate Dr. Libby Maw evaluation and Recc's; He feels that she may not have fully been treated as she has blasts floating around in her blood and is concerned given that her CBC shows 33% blasts and [name] feels that this is the major driving force behind her increasing WBC count.  These findings are strongly consistent with progression of her disease and [name] is to reach out to Dr. Lysle Dingwall at [facility] .  Dr. Elmo Putt and he emphasized that there were no additional treatment options available for this patient and that hospice referral was most appropriate -Palliative care has been now consulted to  begin the process of goals of care transfer to hospice -[name] does not suspect the patient blood counts will stabilize as a driving force of them is her rapidly increasing leukemia.  She will require likely near daily transfusion support until she is ready to transition to hospice care. -[name] feels that the lower extremity  findings may represent leukemic cutis with deposition of the blast cells within the skin. -See Above   Vaginal Bleeding, ruled out -Initially thought to be vaginal bleeding but now likely secondary to irritation from mesh panties -Also will transfuse 1 pack of Platelets to try to get Platelet Goal >20 -Dr. Rip Harbour to follow up on Transvaginal U/S and make additional Recc's -Transvaginal ultrasound showed "Limited study. Transvaginal images of limited utility due to pain. Post menopausal endometrial thickness grossly within normal limits. Heterogeneous myometrial echotexture with numerous echogenic foci indeterminate for microcalcifications from prior inflammation or infection versus vascular calcification versus echogenic foci associated with adenomyosis though no adequate  transvaginal images are available. Depending upon clinical course, evaluation with pelvic MRI could be  Considered." -Now that this is resolved she will need outpatient GYN follow-up  ?Tinea Corporis vs other Rash and other violaceous lesions on the lower extremities; concern for leukemic cutis, slowly improving -Unclear Rash on the LE that has proximal lesions thatare circular appearing erythematous plaques but she also has violaceous lesions that are flat on the lower extremities; LE Lesions could be Leukemia Cutis  -Already on Antifungals with posaconazole but have reached out to pharmacy to see if she needs a topical formulation for ringworm -I asked ID to see the patient and they have started the patient on topical terbinafine for Proximal Lesions  -Had lesions Biopsied at [facility] and was notable for spongiosis with hemorrhage but has now had progression of these new lesions on the LE with continued Neutropenia -ID feels her DDx includes Inflammatory Dermatitis with 2/2 Hemorrhage vs Infectious Etiology (Fungal, Bacterial, Viral) in the setting of Neutropenia vs. Sweet Syndrome vs. Leukemia Cutis -ID discussing with Dermatology  and pleased to have an unclear etiology of her lower extremity lesions but it could be that she has 2 different things going on.  Dr. Juleen China informally discussed with dermatology and they feel that the lower lesions could be capillaritis with micro hemorrhaging due to severe thrombocytopenia and less likely Sweet syndrome, leukemic cutis or atypical infection given that the lesions are flat.  She does have a left lateral calf area with some necrosis that needs to be closely monitored that represents bullous sweets or atypical fungal infection but will continue to monitor see if this improves his with the improvement in her thrombocytopenia -The proximal lesions are more consistent with a tinea infection or an irritant dermatitis and they are going to add topical terbinafine for 2 weeks -If lesions do not improve or progress she will require another biopsy with AFB, fungal, bacterial cultures and pathology to help determine etiology -Medical oncology evaluated and feels that this is consistent with leukemic cutis given her 33% blast cells on her CBC -WOC nurse consulted to remove stitch from prior Bx site -Patient's thinks that her lesions are improving with Terbinafine  DVT prophylaxis: SCDs given thrombocytopenia Code Status: FULL CODE for now  Family Communication: No family present at bedside  Disposition Plan: Pending further palliative care discussions and likely hospice of some sort but will continue to have [place] Discussions  Status is: Inpatient  Remains inpatient appropriate because:Unsafe d/c plan, IV treatments appropriate due  to intensity of illness or inability to take PO and Inpatient level of care appropriate due to severity of illness   Dispo:  Patient From: Home  Planned Disposition: Hospice likely   Medically stable for discharge: Yes     Consultants:   Medical Oncology  Infectious Diseases  Spoke to OB-GYN Dr. Arlina Robes  Palliative Care Medicine   Procedures:  None  Antimicrobials:  Anti-infectives (From admission, onward)   Start     Dose/Rate Route Frequency Ordered Stop   04/23/20 1415  levofloxacin (LEVAQUIN) tablet 500 mg        500 mg Oral Daily 04/23/20 1318     04/21/20 1000  acyclovir (ZOVIRAX) tablet 400 mg        400 mg Oral 2 times daily 04/21/20 0750     04/21/20 1000  posaconazole (NOXAFIL) delayed-release tablet 300 mg        300 mg Oral Daily 04/21/20 0750          Subjective: Seen and examined at bedside this morning and she is feeling down and wanting to speak with the palliative care team.  No nausea or vomiting.  Thinks lesions in her legs are doing better.  States that she is starting to have some joint pain and also neurological pain with some electrical sensations.  No other concerns or complaints at this time and patient's family coming into town next few days.  Objective: Vitals:   04/27/20 0406 04/27/20 1240 04/27/20 2113 04/28/20 0515  BP: 90/60 (!) 121/58 134/68 112/60  Pulse: 86 82 74 77  Resp: 16 16 18 16   Temp: 98.2 F (36.8 C) 98.9 F (37.2 C) 98.5 F (36.9 C) 98.2 F (36.8 C)  TempSrc: Oral Oral Oral Oral  SpO2: 90% 98% 94% 92%  Weight:      Height:        Intake/Output Summary (Last 24 hours) at 04/28/2020 1226 Last data filed at 04/27/2020 1329 Gross per 24 hour  Intake 220 ml  Output --  Net 220 ml   Filed Weights   04/20/20 1500  Weight: 84 kg   Examination: Physical Exam:  Constitutional: WN/WD chronically ill-appearing Caucasian female currently no acute distress appears weak and down today Eyes: Lids and conjunctivae normal, sclerae anicteric  ENMT: External Ears, Nose appear normal. Grossly normal hearing.  Neck: Appears normal, supple, no cervical masses, normal ROM, no appreciable thyromegaly; no JVD Respiratory: Diminished to auscultation bilaterally with coarse breath sounds, no wheezing, rales, rhonchi or crackles. Normal respiratory effort and patient is not tachypenic. No  accessory muscle use.  Unlabored breathing and not wearing supplemental oxygen via nasal cannula Cardiovascular: RRR, no murmurs / rubs / gallops. S1 and S2 auscultated.  Is 1+ lower extremity edema Abdomen: Soft, non-tender, distended secondary body habitus. Bowel sounds positive.  GU: Deferred. Musculoskeletal: No clubbing / cyanosis of digits/nails. No joint deformity upper and lower extremities.  Skin: Has 2 different or extremity lesions which are both improving. No induration; Warm and dry.  Neurologic: CN 2-12 grossly intact with no focal deficits. Romberg sign and cerebellar reflexes not assessed.  Psychiatric: Normal judgment and insight. Alert and oriented x 3.  Depressed appearing mood and appropriate affect.   Data Reviewed: I have personally reviewed following labs and imaging studies  CBC: Recent Labs  Lab 04/23/20 0752 04/24/20 0542 04/25/20 0606 04/26/20 0546 04/27/20 0451 04/28/20 0616  WBC 2.5* 4.4 5.1 6.6 9.5 11.0*  NEUTROABS 0.2* 0.1* 0.3*  --  4.5 0.2*  HGB 8.3* 7.9* 8.3* 6.7* 9.2* 9.0*  HCT 26.3* 24.8* 25.4* 20.9* 27.7* 27.5*  MCV 96.7 95.8 95.5 95.9 93.0 92.6  PLT 13* 28* 20* 12* 17* 12*   Basic Metabolic Panel: Recent Labs  Lab 04/24/20 0542 04/25/20 0606 04/26/20 0546 04/27/20 0451 04/28/20 0616  NA 143 140 144 139 140  K 3.5 3.6 3.9 3.8 3.2*  CL 105 102 105 103 103  CO2 29 26 30 28 28   GLUCOSE 127* 140* 99 100* 101*  BUN <5* <5* <5* 7* 6*  CREATININE 0.79 0.72 0.73 0.83 0.84  CALCIUM 8.7* 8.9 9.0 8.7* 9.1  MG 1.6* 1.8 1.9 1.8 1.8  PHOS 2.9 2.7 3.1 3.7 3.4   GFR: Estimated Creatinine Clearance: 79.8 mL/min (by C-G formula based on SCr of 0.84 mg/dL). Liver Function Tests: Recent Labs  Lab 04/24/20 0542 04/25/20 0606 04/26/20 0546 04/27/20 0451 04/28/20 0616  AST 39 48* 54* 64* 69*  ALT 14 15 15 20 20   ALKPHOS 150* 159* 185* 210* 242*  BILITOT 0.9 0.8 0.5 0.9 0.9  PROT 5.2* 5.4* 5.0* 5.3* 6.0*  ALBUMIN 2.9* 2.9* 2.6* 2.9* 3.4*    No results for input(s): LIPASE, AMYLASE in the last 168 hours. No results for input(s): AMMONIA in the last 168 hours. Coagulation Profile: No results for input(s): INR, PROTIME in the last 168 hours. Cardiac Enzymes: No results for input(s): CKTOTAL, CKMB, CKMBINDEX, TROPONINI in the last 168 hours. BNP (last 3 results) No results for input(s): PROBNP in the last 8760 hours. HbA1C: No results for input(s): HGBA1C in the last 72 hours. CBG: No results for input(s): GLUCAP in the last 168 hours. Lipid Profile: No results for input(s): CHOL, HDL, LDLCALC, TRIG, CHOLHDL, LDLDIRECT in the last 72 hours. Thyroid Function Tests: No results for input(s): TSH, T4TOTAL, FREET4, T3FREE, THYROIDAB in the last 72 hours. Anemia Panel: No results for input(s): VITAMINB12, FOLATE, FERRITIN, TIBC, IRON, RETICCTPCT in the last 72 hours. Sepsis Labs: No results for input(s): PROCALCITON, LATICACIDVEN in the last 168 hours.  Recent Results (from the past 240 hour(s))  SARS CORONAVIRUS 2 (TAT 6-24 HRS) Nasopharyngeal Nasopharyngeal Swab     Status: None   Collection Time: 04/21/20  1:37 AM   Specimen: Nasopharyngeal Swab  Result Value Ref Range Status   SARS Coronavirus 2 NEGATIVE NEGATIVE Final    Comment: (NOTE) SARS-CoV-2 target nucleic acids are NOT DETECTED.  The SARS-CoV-2 RNA is generally detectable in upper and lower respiratory specimens during the acute phase of infection. Negative results do not preclude SARS-CoV-2 infection, do not rule out co-infections with other pathogens, and should not be used as the sole basis for treatment or other patient management decisions. Negative results must be combined with clinical observations, patient history, and epidemiological information. The expected result is Negative.  Fact Sheet for Patients: SugarRoll.be  Fact Sheet for Healthcare Providers: https://www.woods-mathews.com/  This test is not  yet approved or cleared by the [place] FDA and  has been authorized for detection and/or diagnosis of SARS-CoV-2 by FDA under an Emergency Use Authorization (EUA). This EUA will remain  in effect (meaning this test can be used) for the duration of the COVID-19 declaration under Se ction 564(b)(1) of the Act, 21 U.S.C. section 360bbb-3(b)(1), unless the authorization is terminated or revoked sooner.  Performed at [facility], [place] [address], [place], [place] [address]     RN Pressure Injury Documentation:     Estimated body mass index is 28.16 kg/m as calculated  from the following:   Height as of this encounter: 5' 7.99" (1.727 m).   Weight as of this encounter: 84 kg.  Malnutrition Type:  Nutrition Problem: Moderate Malnutrition Etiology: chronic illness,cancer and cancer related treatments  Malnutrition Characteristics:  Signs/Symptoms: mild fat depletion,mild muscle depletion  Nutrition Interventions:  Interventions: Ensure Enlive (each supplement provides 350kcal and 20 grams of protein),MVI   Radiology Studies: No results found.  Scheduled Meds: . acyclovir  400 mg Oral BID  . cholecalciferol  2,000 Units Oral Daily  . escitalopram  5 mg Oral Daily  . feeding supplement  237 mL Oral TID BM  . levofloxacin  500 mg Oral Daily  . multivitamin with minerals  1 tablet Oral Daily  . oxyCODONE  20 mg Oral Q12H  . pantoprazole  40 mg Oral Daily  . posaconazole  300 mg Oral Daily  . pregabalin  150 mg Oral BID  . terbinafine   Topical BID  . traZODone  50 mg Oral QHS   Continuous Infusions:   LOS: 8 days   Kerney Elbe, DO [facility] PAGER is on AMION  If 7PM-7AM, please contact night-coverage [email or web address]

## 2020-04-28 NOTE — Progress Notes (Signed)
Occupational Therapy Discharge Patient Details Name: Kelly Roberson MRN: [id number] DOB: [birth year] Today's Date: 04/28/2020 Time:  -     Patient discharged from OT services secondary to medical decline - will need to re-order OT to resume therapy services. Per chart review and MD note dated 4/25 "patient will likely require daily transfusion support until she transitions to hospice care." Palliative consult is pending. Spoke with patient today who states she was told she only has 4-6 weeks left to live and is not interested in OT services. Will sign off at this time.  Please see latest therapy progress note for current level of functioning and progress toward goals.    Delbert Phenix OT OT pager: [place] 04/28/2020, 10:42 AM

## 2020-04-28 NOTE — Progress Notes (Signed)
Physical Therapy Discharge Patient Details Name: Kelly Roberson MRN: [id number] DOB: 06-08-1960 Today's Date: 04/28/2020 Time:  -     Patient discharged from PT services secondary to medical decline - will need to re-order PT to resume therapy services.  Please see latest therapy progress note for current level of functioning and progress toward goals.      GP    [place] Pager [phone] Office [phone]  Claretha Cooper 04/28/2020, 2:29 PM

## 2020-04-28 NOTE — Consult Note (Signed)
Consultation Note Date: 04/28/2020   Patient Name: Kelly Roberson  DOB: 06-Nov-1960  MRN: [id number]  Age / Sex: 59 y.o., female  PCP: Kristie Cowman, MD Referring Physician: Kerney Elbe, DO  Reason for Consultation: Establishing goals of care  HPI/Patient Profile: 60 y.o. female  admitted on 04/20/2020  As per TRH : Kelly Roberson a 60 y.o.femalewith medical history significant ofAcute Myeloid leukemia having been admitted to [facility] for the last 60 days. Was discharged home for 24h before she presented to oncology for further evaluation for ongoing weakness. She declined admission to hospital at that point but now presents again to oncology for ongoing weakness who is requesting admission to the hospital. Comorbid conditions include - HTN, Anxiety, Hepatitis B/C, IBS, RLS.   Clinical Assessment and Goals of Care:  Patient remains admitted to hospital medicine service and medical oncology is following.  Patient has also been visited by spiritual care.  Palliative medicine consultation for ongoing goals of care discussions has been requested.  Patient has medical history significant for AML has been admitted for worsening weakness and severe cytopenias.  At this stage, her cancer is found to be rapidly progressing and her blood counts are not able to be kept above transfusion threshold as per oncology.  She follows with [facility] as well.  Patient resting in bed.  I introduced myself and palliative care as follows:  Palliative medicine is specialized medical care for people living with serious illness. It focuses on providing relief from the symptoms and stress of a serious illness. The goal is to improve quality of life for both the patient and the family.  Goals of care: Broad aims of medical therapy in relation to the patient's values and preferences. Our aim is to provide medical care  aimed at enabling patients to achieve the goals that matter most to them, given the circumstances of their particular medical situation and their constraints.   Brief life review performed.  Discussed about patient's cancer journey.  Patient lives by herself here in [name], [name].  She has a son who lives in [name] and a son who lives in [name].  She states that both of her children are going to come into town within the next 24-48 hours.  We discussed about differences between hospice versus palliative services.  We discussed about advanced directives completion of documents such as living will and designation of a healthcare power of attorney agent.  We discussed about appropriate symptom management of pain and anxiety being the patient's predominant symptoms.  Goals wishes and values important to the patient attempted to be explored.  Patient still very much in shock and states that she is trying to process all of this information.  NEXT OF KIN 2 sons.  SUMMARY OF RECOMMENDATIONS   Full code for now Continue goals of care conversations Chaplain consult for advance directives completion as per patient request. Would recommend home with hospice services, discussed this with the patient, she will think about it further and further discussed with  her children.  We spent some time exploring hospice philosophy of care and its benefits and advantages to the patient at this stage of her illness.  Code Status/Advance Care Planning:  Full code    Symptom Management:      Palliative Prophylaxis:   Delirium Protocol  Additional Recommendations (Limitations, Scope, Preferences):  Full Scope Treatment  Psycho-social/Spiritual:   Desire for further Chaplaincy support:yes  Additional Recommendations: Caregiving  Support/Resources  Prognosis:   Unable to determine  Discharge Planning: To Be Determined      Primary Diagnoses: Present on Admission: . Symptomatic  anemia . AML (acute myeloblastic leukemia) ([place]) . Neutropenia ([place]) . Thrombocytopenia ([place])   I have reviewed the medical record, interviewed the patient and family, and examined the patient. The following aspects are pertinent.  Past Medical History:  Diagnosis Date  . Anxiety   . Cancer ([place])   . Hepatitis B   . Hepatitis C   . Hypertension   . IBS (irritable bowel syndrome)   . RLS (restless legs syndrome) 01/09/2020   Social History   Socioeconomic History  . Marital status: Divorced    Spouse name: Not on file  . Number of children: Not on file  . Years of education: Not on file  . Highest education level: Not on file  Occupational History  . Occupation: disability  Tobacco Use  . Smoking status: Former Research scientist (life sciences)  . Smokeless tobacco: Never Used  Vaping Use  . Vaping Use: Never used  Substance and Sexual Activity  . Alcohol use: Not Currently    Comment: occasionally   . Drug use: Not Currently  . Sexual activity: Not Currently  Other Topics Concern  . Not on file  Social History Narrative   Lives alone   Right Handed   Drinks 2-3 cups caffeine daily   Social Determinants of Health   Financial Resource Strain: Not on file  Food Insecurity: Not on file  Transportation Needs: Not on file  Physical Activity: Not on file  Stress: Not on file  Social Connections: Not on file   Family History  Problem Relation Age of Onset  . Pancreatic cancer Father   . Colon cancer Maternal Aunt   . Diabetes Maternal Uncle   . Pancreatic cancer Maternal Grandfather    Scheduled Meds: . acyclovir  400 mg Oral BID  . cholecalciferol  2,000 Units Oral Daily  . escitalopram  5 mg Oral Daily  . feeding supplement  237 mL Oral TID BM  . levofloxacin  500 mg Oral Daily  . multivitamin with minerals  1 tablet Oral Daily  . oxyCODONE  20 mg Oral Q12H  . pantoprazole  40 mg Oral Daily  . posaconazole  300 mg Oral Daily  . pregabalin  150 mg Oral BID  . terbinafine   Topical  BID  . traZODone  50 mg Oral QHS   Continuous Infusions: PRN Meds:.ALPRAZolam, ondansetron (ZOFRAN) IV, oxyCODONE Medications Prior to Admission:  Prior to Admission medications   Medication Sig Start Date End Date Taking? Authorizing Provider  acyclovir (ZOVIRAX) 400 MG tablet Take 400 mg by mouth 2 (two) times daily.   Yes [provider]  ALPRAZolam Duanne Moron) 0.5 MG tablet Take 0.5 mg by mouth at bedtime as needed for anxiety. 11/26/19  Yes [provider]  cholecalciferol (VITAMIN D) 25 MCG (1000 UNIT) tablet Take 2,000 Units by mouth daily.   Yes [provider]  escitalopram (LEXAPRO) 10 MG tablet Take 5 mg by  mouth daily.   Yes [provider]  oxyCODONE (OXYCONTIN) 20 mg 12 hr tablet Take 20 mg by mouth every 12 (twelve) hours.   Yes [provider]  pantoprazole (PROTONIX) 40 MG tablet Take 40 mg by mouth daily.   Yes [provider]  POSACONAZOLE PO Take 300 mg by mouth daily.   Yes [provider]  pregabalin (LYRICA) 150 MG capsule Take 150 mg by mouth 2 (two) times daily.   Yes [provider]  traZODone (DESYREL) 100 MG tablet Take 50 mg by mouth at bedtime. 11/20/19  Yes [provider]  topiramate (TOPAMAX) 25 MG tablet Take 1 tablet (25 mg total) by mouth 2 (two) times daily. Patient not taking: No sig reported 02/11/20   Garvin Fila, MD   Allergies  Allergen Reactions  . Ace Inhibitors     Other reaction(s): Cough  . Lisinopril Cough  . Tylenol [Acetaminophen] Hives and Itching    Pt. States she only itches from tylenol    Review of Systems Pain in her extremities as well as anxiety. Physical Exam Appears weak appears chronically ill Has 1-2+ lower extremity edema Regular work of breathing S1-S2 Awake alert Vital Signs: BP 112/60 (BP Location: Right Arm)   Pulse 77   Temp 98.2 F (36.8 C) (Oral)   Resp 16   Ht 5' 7.99" (1.727 m)   Wt 84 kg   SpO2 92%   BMI 28.16 kg/m  Pain  Scale: 0-10 POSS *See Group Information*: 1-Acceptable,Awake and alert Pain Score: 5    SpO2: SpO2: 92 % O2 Device:SpO2: 92 % O2 Flow Rate: .O2 Flow Rate (L/min): 2 L/min  IO: Intake/output summary:   Intake/Output Summary (Last 24 hours) at 04/28/2020 1157 Last data filed at 04/27/2020 1329 Gross per 24 hour  Intake 220 ml  Output --  Net 220 ml    LBM: Last BM Date: 04/23/20 Baseline Weight: Weight: 84 kg Most recent weight: Weight: 84 kg     Palliative Assessment/Data:   Palliative performance scale 40%  Time In: 11 Time Out: 12 Time Total: 60 Greater than 50%  of this time was spent counseling and coordinating care related to the above assessment and plan.  Signed by: Loistine Chance, MD   Please contact Palliative Medicine Team phone at [phone] for questions and concerns.  For individual provider: See Shea Evans

## 2020-04-28 NOTE — Progress Notes (Signed)
This is a late entry note from a visit that took place on 4/25 at 4 pm.  Dasie was tearful as she shared about learning earlier that day that she had only 5-6 weeks instead of the 6 months or so that she thought she would have.  She was in shock and trying to process and make plans for herself. She was thinking about where to spend her remaining time.  She has two sons, one in [place] and one in [place].  Her son in [place] is expecting a baby with his wife and her son in [place] has a 71 year old daughter.  She wants to spend time with both sons and their families.  She is also considering what she wants for her final resting place.  She is still considering whether to be buried or cremated. All of her family, besides her sister, have been buried and she believes that is the most in keeping with "ashes to ashes, dust to dust," but she is leaning towards cremation and experiencing some morale distress about it.  She was overwhelmed and wanted to rest, but would like follow up over the next few days.  We will visit her when we are able, but please also page as needs arise or as patient requests.  [place], [place] Pager, [phone] 3:19 PM

## 2020-04-28 NOTE — Progress Notes (Signed)
Chaplain engaged in a follow-up visit with Kelly Roberson.  Chaplain continues to offer support.  Kelly Roberson wanted to get some rest and chaplain told her she would check in with her tomorrow.    Kelly Roberson appeared to be having a hard time keeping her eyes open or staying alert.  Chaplain will follow-up.    04/28/20 1300  Clinical Encounter Type  Visited With Patient  Visit Type Follow-up

## 2020-04-28 NOTE — Progress Notes (Signed)
Nutrition Follow-up  DOCUMENTATION CODES:   Non-severe (moderate) malnutrition in context of chronic illness  INTERVENTION:   -Ensure Enlive po TID, each supplement provides 350 kcal and 20 grams of protein.  -MVI with minerals daily.  NUTRITION DIAGNOSIS:   Moderate Malnutrition related to chronic illness,cancer and cancer related treatments as evidenced by mild fat depletion,mild muscle depletion.  Ongoing.  GOAL:   Patient will meet greater than or equal to 90% of their needs   MONITOR:   PO intake,Supplement acceptance,Labs,Weight trends  ASSESSMENT:   60 yo female with a PMH of HTN, anxiety, Hep B & C, IBS, RLS, and acute myeloid leukemia having been admitted to [facility] for the last 60 days. Pt has been having ongoing weakness since 24 hrs after d/c at [facility]. She presents with symptomatic anemia and ambulatory dysfunction.  Per chart review, pt was told she has poor prognosis of 4-7 weeks. Palliative care consulted for [place], still TBD and full code at this time. Will continue current interventions until decisions are made. Will monitor.  Admission weight: 185 lbs.  Medications: Vitamin D, Multivitamin with minerals daily, IV Mg sulfate, IV KCl  Labs reviewed: Low K  Diet Order:   Diet Order            Diet regular Room service appropriate? Yes; Fluid consistency: Thin  Diet effective now                 EDUCATION NEEDS:   Education needs have been addressed  Skin:  Skin Assessment: Skin Integrity Issues: Skin Integrity Issues:: Incisions Incisions: R upper arm and R knee, both closed  Last BM:  unknown/PTA  Height:   Ht Readings from Last 1 Encounters:  04/20/20 5' 7.99" (1.727 m)    Weight:   Wt Readings from Last 1 Encounters:  04/20/20 84 kg   BMI:  Body mass index is 28.16 kg/m.  Estimated Nutritional Needs:   Kcal:  2000-2200  Protein:  100-115 grams  Fluid:  >2 L  Clayton Bibles, MS, RD, LDN Inpatient Clinical Dietitian Contact  information available via Amion

## 2020-04-29 DIAGNOSIS — C9202 Acute myeloblastic leukemia, in relapse: Secondary | ICD-10-CM | POA: Diagnosis not present

## 2020-04-29 DIAGNOSIS — Z515 Encounter for palliative care: Secondary | ICD-10-CM

## 2020-04-29 DIAGNOSIS — R52 Pain, unspecified: Secondary | ICD-10-CM | POA: Diagnosis not present

## 2020-04-29 DIAGNOSIS — D701 Agranulocytosis secondary to cancer chemotherapy: Secondary | ICD-10-CM | POA: Diagnosis not present

## 2020-04-29 DIAGNOSIS — Z7189 Other specified counseling: Secondary | ICD-10-CM

## 2020-04-29 DIAGNOSIS — E440 Moderate protein-calorie malnutrition: Secondary | ICD-10-CM | POA: Diagnosis not present

## 2020-04-29 LAB — COMPREHENSIVE METABOLIC PANEL
ALT: 26 U/L (ref 0–44)
AST: 85 U/L — ABNORMAL HIGH (ref 15–41)
Albumin: 2.9 g/dL — ABNORMAL LOW (ref 3.5–5.0)
Alkaline Phosphatase: 308 U/L — ABNORMAL HIGH (ref 38–126)
Anion gap: 8 (ref 5–15)
BUN: 6 mg/dL — ABNORMAL LOW (ref 8–23)
CO2: 29 mmol/L (ref 22–32)
Calcium: 8.9 mg/dL (ref 8.9–10.3)
Chloride: 103 mmol/L (ref 98–111)
Creatinine, Ser: 0.91 mg/dL (ref 0.44–1.00)
GFR, Estimated: >60 mL/min (ref >60)
Glucose, Bld: 100 mg/dL — ABNORMAL HIGH (ref 70–99)
Potassium: 3.5 mmol/L (ref 3.5–5.1)
Sodium: 140 mmol/L (ref 135–145)
Total Bilirubin: 1 mg/dL (ref 0.3–1.2)
Total Protein: 5.2 g/dL — ABNORMAL LOW (ref 6.5–8.1)

## 2020-04-29 LAB — CBC WITH DIFFERENTIAL/PLATELET
Abs Immature Granulocytes: 0 10*3/uL (ref 0.00–0.07)
Basophils Absolute: 0 10*3/uL (ref 0.0–0.1)
Basophils Relative: 0 %
Blasts: 70 %
Eosinophils Absolute: 0 10*3/uL (ref 0.0–0.5)
Eosinophils Relative: 0 %
HCT: 22.6 % — ABNORMAL LOW (ref 36.0–46.0)
Hemoglobin: 7.7 g/dL — ABNORMAL LOW (ref 12.0–15.0)
Lymphocytes Relative: 30 %
Lymphs Abs: 4.4 10*3/uL — ABNORMAL HIGH (ref 0.7–4.0)
MCH: 30.6 pg (ref 26.0–34.0)
MCHC: 34.1 g/dL (ref 30.0–36.0)
MCV: 89.7 fL (ref 80.0–100.0)
Monocytes Absolute: 0 10*3/uL — ABNORMAL LOW (ref 0.1–1.0)
Monocytes Relative: 0 %
Neutro Abs: 0 10*3/uL — CL (ref 1.7–7.7)
Neutrophils Relative %: 0 %
Platelets: 8 10*3/uL — CL (ref 150–400)
RBC: 2.52 MIL/uL — ABNORMAL LOW (ref 3.87–5.11)
RDW: 16.7 % — ABNORMAL HIGH (ref 11.5–15.5)
WBC: 14.7 10*3/uL — ABNORMAL HIGH (ref 4.0–10.5)
nRBC: 0.2 % (ref 0.0–0.2)

## 2020-04-29 LAB — MAGNESIUM: Magnesium: 1.9 mg/dL (ref 1.7–2.4)

## 2020-04-29 LAB — PHOSPHORUS: Phosphorus: 3.1 mg/dL (ref 2.5–4.6)

## 2020-04-29 MED ORDER — HYDROMORPHONE HCL 1 MG/ML IJ SOLN
0.5000 mg | INTRAMUSCULAR | Status: DC | PRN
  Administered 2020-04-29 – 2020-04-30 (×6): 0.5 mg via INTRAVENOUS
  Filled 2020-04-29 (×6): qty 0.5

## 2020-04-29 MED ORDER — SENNA 8.6 MG PO TABS
1.0000 | ORAL_TABLET | Freq: Every day | ORAL | Status: DC
  Administered 2020-04-29: 8.6 mg via ORAL
  Filled 2020-04-29: qty 1

## 2020-04-29 MED ORDER — SODIUM CHLORIDE 0.9% IV SOLUTION: Freq: Once | INTRAVENOUS | Status: AC

## 2020-04-29 NOTE — TOC Progression Note (Signed)
Transition of Care [facility]) - Progression Note    Patient Details  Name: Kelly Roberson MRN: [id number] Date of Birth: Jun 09, 1960  Transition of Care [facility]) CM/SW Contact  Mahamadou Weltz, Marjie Skiff, RN Phone Number: 04/29/2020, 2:39 PM  Clinical Narrative:    [facility] consult for Residential vs Home Hospice. Spoke with pt at length at bedside for dc planning. Pt expressed that ideally she would like to go either to her home or to the home of one of sons at discharge. Pt asking several questions about supportive and life extending treatments. Pt was informed that treatment with hospice would be limited to comfort related needs. She appears to be unable to make the decision for no more life extending treatment at this time. Her son Darnelle Maffucci is on the way to the hospital and she would like to talk to him today and have me come back tomorrow and speak with him as well. TOC will continue to follow.   Barriers to Discharge: Continued Medical Work up

## 2020-04-29 NOTE — Progress Notes (Signed)
PROGRESS NOTE    Kelly Roberson  HWE:[id number] DOB: 1960/03/21 DOA: 04/20/2020 PCP: Kristie Cowman, MD   Brief Narrative: Kelly Roberson is a 60 y.o. female with a history of AML. Patient was recently admitted at [facility] for a 2 month admission. She presented this admission with weakness in setting of progressing AML. She has required multiple blood/platelet transfusions. Medical oncology consulted with no recommendations for continued chemotherapy/treatments for her leukemia and recommend hospice referral.   Assessment & Plan:   Principal Problem:   AML (acute myeloblastic leukemia) ([place]) Active Problems:   Neutropenia ([place])   Thrombocytopenia ([facility])   Symptomatic anemia   Malnutrition of moderate degree   Rash and nonspecific skin eruption   Palliative care by specialist   Goals of care, counseling/discussion   Generalized pain   AML Patient follows with oncology at [facility]. Medical oncology consulted while inpatient. After discussion with primary oncologist, recommendation for hospice with no treatment currently recommended for treatment. Palliative care medicine consulted for continued goals of care discussions.  Acute on chronic thrombocytopenia Chronic pancytopenia Neutropenia Medical oncology consulted. Secondary to above. Patient is requiring multiple/frequent transfusions of PRBC and platelets. She has received 1 unit of PRBC and 3 units of platelets to date. Medical oncology recommendation for transfusion if hemoglobin < 7.0 and platelets <10k. Platelets of 8k today. -Transfuse 1 unit of platelets  Leg rash Infectious disease consulted with concern for possible leukemia cutis with regard to leg rash. Possible tinea infection of proximal lower extremity/abdomen. ID recommended to treat with terbinafine x2 weeks in addition to continuing posaconazole.   Hypokalemia Hypomagnesemia -Supplementation as needed  Elevated AST/ALP Increasing. No associated abdominal pain.  Bilirubin normal -CMP in AM; may need abdominal imaging  Fall Prior to admission. PT/OT recommending SNF   DVT prophylaxis: SCDs Code Status:   Code Status: Full Code Family Communication: None at bedside Disposition Plan: Discharge pending continued goals of care discussions. Anticipate discharge home likely with hospice   Consultants:   Medical oncology  Palliative care medicine  Procedures:   None  Antimicrobials:  Acyclovir  Levaquin  Posaconazole    Subjective: Patient with pain mainly in extremities. Feeling down after hearing about updated prognosis. Patient has a new granddaughter on the way, who she has named Olen Cordial and is due in August.  Objective: Vitals:   04/28/20 2112 04/28/20 2113 04/29/20 0451 04/29/20 0452  BP: 128/64   130/72  Pulse:  76 79   Resp:  20 20   Temp:  98 F (36.7 C) 98.4 F (36.9 C)   TempSrc:  Oral Oral   SpO2:  97% 91%   Weight:      Height:       No intake or output data in the 24 hours ending 04/29/20 1628 Filed Weights   04/20/20 1500  Weight: 84 kg    Examination:  General exam: Appears calm and mildly uncomfortable Respiratory system: Clear to auscultation. Respiratory effort normal. Cardiovascular system: S1 & S2 heard, RRR. No murmurs, rubs, gallops or clicks. Gastrointestinal system: Abdomen is nondistended, soft and nontender. No organomegaly or masses felt. Normal bowel sounds heard. Central nervous system: Alert and oriented. No focal neurological deficits. Musculoskeletal: No edema. No calf tenderness Skin: No cyanosis. Purpuric rash on BLE Psychiatry: Judgement and insight appear normal. Mood & affect appropriate.     Data Reviewed: I have personally reviewed following labs and imaging studies  CBC Lab Results  Component Value Date   WBC 14.7 (H) 04/29/2020  RBC 2.52 (L) 04/29/2020   HGB 7.7 (L) 04/29/2020   HCT 22.6 (L) 04/29/2020   MCV 89.7 04/29/2020   MCH 30.6 04/29/2020   PLT 8 (LL)  04/29/2020   MCHC 34.1 04/29/2020   RDW 16.7 (H) 04/29/2020   LYMPHSABS 4.4 (H) 04/29/2020   MONOABS 0.0 (L) 04/29/2020   EOSABS 0.0 04/29/2020   BASOSABS 0.0 96/78/9381     Last metabolic panel Lab Results  Component Value Date   NA 140 04/29/2020   K 3.5 04/29/2020   CL 103 04/29/2020   CO2 29 04/29/2020   BUN 6 (L) 04/29/2020   CREATININE 0.91 04/29/2020   GLUCOSE 100 (H) 04/29/2020   GFRNONAA >60 04/29/2020   GFRAA >60 10/04/2019   CALCIUM 8.9 04/29/2020   PHOS 3.1 04/29/2020   PROT 5.2 (L) 04/29/2020   ALBUMIN 2.9 (L) 04/29/2020   LABGLOB 2.8 01/10/2020   AGRATIO 1.5 01/17/2019   BILITOT 1.0 04/29/2020   ALKPHOS 308 (H) 04/29/2020   AST 85 (H) 04/29/2020   ALT 26 04/29/2020   ANIONGAP 8 04/29/2020    CBG (last 3)  No results for input(s): GLUCAP in the last 72 hours.   GFR: Estimated Creatinine Clearance: 73.7 mL/min (by C-G formula based on SCr of 0.91 mg/dL).  Coagulation Profile: No results for input(s): INR, PROTIME in the last 168 hours.  Recent Results (from the past 240 hour(s))  SARS CORONAVIRUS 2 (TAT 6-24 HRS) Nasopharyngeal Nasopharyngeal Swab     Status: None   Collection Time: 04/21/20  1:37 AM   Specimen: Nasopharyngeal Swab  Result Value Ref Range Status   SARS Coronavirus 2 NEGATIVE NEGATIVE Final    Comment: (NOTE) SARS-CoV-2 target nucleic acids are NOT DETECTED.  The SARS-CoV-2 RNA is generally detectable in upper and lower respiratory specimens during the acute phase of infection. Negative results do not preclude SARS-CoV-2 infection, do not rule out co-infections with other pathogens, and should not be used as the sole basis for treatment or other patient management decisions. Negative results must be combined with clinical observations, patient history, and epidemiological information. The expected result is Negative.  Fact Sheet for Patients: SugarRoll.be  Fact Sheet for Healthcare  Providers: https://www.woods-mathews.com/  This test is not yet approved or cleared by the [place] FDA and  has been authorized for detection and/or diagnosis of SARS-CoV-2 by FDA under an Emergency Use Authorization (EUA). This EUA will remain  in effect (meaning this test can be used) for the duration of the COVID-19 declaration under Se ction 564(b)(1) of the Act, 21 U.S.C. section 360bbb-3(b)(1), unless the authorization is terminated or revoked sooner.  Performed at [facility], [place] [address] [place], [place] [address]         Radiology Studies: No results found.      Scheduled Meds: . sodium chloride   Intravenous Once  . acyclovir  400 mg Oral BID  . cholecalciferol  2,000 Units Oral Daily  . escitalopram  5 mg Oral Daily  . feeding supplement  237 mL Oral TID BM  . levofloxacin  500 mg Oral Daily  . multivitamin with minerals  1 tablet Oral Daily  . oxyCODONE  20 mg Oral Q12H  . pantoprazole  40 mg Oral Daily  . posaconazole  300 mg Oral Daily  . pregabalin  150 mg Oral BID  . senna  1 tablet Oral QHS  . terbinafine   Topical BID  . traZODone  50 mg Oral QHS  Continuous Infusions:   LOS: 9 days     Cordelia Poche, MD [facility] 04/29/2020, 4:28 PM  If 7PM-7AM, please contact night-coverage [email or web address]

## 2020-04-29 NOTE — Progress Notes (Signed)
Daily Progress Note   Patient Name: Kelly Roberson       Date: 04/29/2020 DOB: [birth year]  Age: 60 y.o. MRN#: [id number] Attending Physician: Mariel Aloe, MD Primary Care Physician: Kristie Cowman, MD Admit Date: 04/20/2020  Reason for Consultation/Follow-up: Establishing goals of care  Subjective: Patient is sitting up by the side of her bed.  She complains of a new pain in her right hip.  She is awake alert, a little subdued, a little flat in affect.  However she does engage and is able to participate in conversations.  We discussed again about CODE STATUS and broad goals of care and we discussed again about differences between hospice versus palliative services.  We discussed about differences between residential hospice and home with hospice services.  Call placed to son Erlene Quan and son Darnelle Maffucci separately and discussed with both of them about patient's current condition and next steps and goals of care decisions.  See below.   Length of Stay: 9  Current Medications: Scheduled Meds:  . acyclovir  400 mg Oral BID  . cholecalciferol  2,000 Units Oral Daily  . escitalopram  5 mg Oral Daily  . feeding supplement  237 mL Oral TID BM  . levofloxacin  500 mg Oral Daily  . multivitamin with minerals  1 tablet Oral Daily  . oxyCODONE  20 mg Oral Q12H  . pantoprazole  40 mg Oral Daily  . posaconazole  300 mg Oral Daily  . pregabalin  150 mg Oral BID  . senna  1 tablet Oral QHS  . terbinafine   Topical BID  . traZODone  50 mg Oral QHS    Continuous Infusions:   PRN Meds: ALPRAZolam, HYDROmorphone (DILAUDID) injection, ondansetron (ZOFRAN) IV, oxyCODONE  Physical Exam         Awake alert sitting up in bed Has lower extremity edema Regular work of breathing Flattened  affect S1-S2 Abdomen is not distended  Vital Signs: BP 130/72 (BP Location: Right Arm)   Pulse 79   Temp 98.4 F (36.9 C) (Oral)   Resp 20   Ht 5' 7.99" (1.727 m)   Wt 84 kg   SpO2 91%   BMI 28.16 kg/m  SpO2: SpO2: 91 % O2 Device: O2 Device: Room Air O2 Flow Rate: O2 Flow Rate (L/min): 2 L/min  Intake/output  summary: No intake or output data in the 24 hours ending 04/29/20 1014 LBM: Last BM Date: 04/27/20 Baseline Weight: Weight: 84 kg Most recent weight: Weight: 84 kg      Palliative performance scale 40%. Palliative Assessment/Data:      Patient Active Problem List   Diagnosis Date Noted  . Rash and nonspecific skin eruption 04/23/2020  . Malnutrition of moderate degree 04/21/2020  . Symptomatic anemia 04/20/2020  . Intractable headache 02/14/2020  . RLS (restless legs syndrome) 01/09/2020  . Neutropenic fever ([place])   . Thrombocytopenia ([place])   . Sepsis ([place]) 09/10/2019  . Severe sepsis ([place]) 09/09/2019  . Typhlitis   . Colitis 07/29/2019  . Hypokalemia 07/29/2019  . Pancytopenia ([place]) 07/29/2019  . Antineoplastic chemotherapy induced pancytopenia ([place]) 07/01/2019  . Pancytopenia due to antineoplastic chemotherapy ([place]) 07/01/2019  . Headache 07/01/2019  . Neutropenia ([place]) 07/01/2019  . Macrocytic anemia 05/01/2019  . AML (acute myeloblastic leukemia) ([place]) 05/01/2019  . Neck pain on right side 05/01/2019  . Leukocytosis 05/01/2019  . Bicytopenia 04/30/2019  . Macrocytosis without anemia 02/12/2019  . Family history of hemochromatosis 02/12/2019  . Mixed hyperlipidemia 01/18/2019  . Abnormal LFTs 01/18/2019  . Dandruff in adult 01/18/2019  . Hair loss 01/18/2019  . Muscle weakness 01/18/2019  . Peripheral neuropathy 01/18/2019  . Other fatigue 01/18/2019  . Essential hypertension 06/01/2018  . Palpitations 06/01/2018  . Anxiety disorder 06/01/2018  . Insomnia 06/01/2018  . Irritable bowel syndrome with diarrhea 06/01/2018  . Barrett's esophagus  with dysplasia 06/01/2018    Palliative Care Assessment & Plan   Patient Profile:  Kelly Roberson a 60 y.o.femalewith medical history significant ofAcute Myeloid leukemia having been admitted to [facility] for the last 60 days. Was discharged home for 24h before she presented to oncology for further evaluation for ongoing weakness. She declined admission to hospital at that point but now presents again to oncology for ongoing weakness who is requesting admission to the hospital. Comorbid conditions include - HTN, Anxiety, Hepatitis B/C, IBS, RLS.    Patient remains admitted to hospital medicine service and medical oncology is following.  Patient has also been visited by spiritual care.  Palliative medicine consultation for ongoing goals of care discussions has been requested.  Patient has medical history significant for AML has been admitted for worsening weakness and severe cytopenias.  At this stage, her cancer is found to be rapidly progressing and her blood counts are not able to be kept above transfusion threshold as per oncology.  She follows with [facility] as well.  Assessment: Palliative services following for CODE STATUS discussions, goals of care discussions, symptom management as well as discussions for arriving at most suitable disposition option for this patient. Functional decline Generalized pain   Recommendations/Plan: Family meeting: Discussed with the patient in detail.  We reviewed about her current condition of acute myeloid leukemia and need for frequent transfusions.  Discussed about CODE STATUS of DO NOT RESUSCITATE versus full code.  Offered medical recommendation for establishing DO NOT RESUSCITATE.  We discussed about different levels of hospice support.  General criteria for residential hospice were brought up and explored.  Call placed and discussed with son Darnelle Maffucci at [phone].  He lives in [place].  I discussed with Darnelle Maffucci about the patient's  current condition and recommendation for DO NOT RESUSCITATE and hospice.  We talked about different levels of hospice support.  Call placed and discussed separately with son Erlene Quan as well.  Again, I discussed with him about  the patient's acute myeloid leukemia, and final recommendations from oncology being for the patient to consider a hospice approach.  Differences between full code versus DNR were explained and differences between residential hospice and home with hospice were also touched upon.  Patient's sons will come in and check in with the patient and they are also talking with her on the phone.  They are appreciative of information provided.  Palliative to continue to stay in touch with them.  Will add IV Dilaudid for pain.  Detailed discussion with the patient about her current condition.  She still wants to discuss further both with medical oncology here as well as at [facility].  Patient still trying to process the finality of her current condition.  She states that she wonders whether [facility] would be able to offer her a bone marrow transplant or not.  She states that she feels that something else could be done or that some other place could have " something more up there is sleeve to help me".  Offered empathic communication, active listening and supportive presence to the patient at this difficult time.  Palliative to follow.  Goals of Care and Additional Recommendations:  Limitations on Scope of Treatment: Full Scope Treatment  Code Status:    Code Status Orders  (From admission, onward)         Start     Ordered   04/20/20 1612  Full code  Continuous        04/20/20 1612        Code Status History    Date Active Date Inactive Code Status Order ID Comments User Context   09/10/2019 0258 09/18/2019 1934 Full Code [id number]  Collier Bullock, MD ED   09/10/2019 0041 09/10/2019 0258 Full Code [id number]  Shela Leff, MD ED   07/29/2019 2322 08/03/2019 2010 Full Code [id number]   Shela Leff, MD ED   07/01/2019 0413 07/03/2019 2059 Full Code [id number]  Neena Rhymes, MD ED   04/30/2019 2309 05/02/2019 0342 Full Code [id number]  Elwyn Reach, MD Inpatient   Advance Care Planning Activity       Prognosis:   ? 2-4 weeks Could be much shorter, patient has high risk for ongoing decline and decompensation given her terminal illness.   Discharge Planning:  Hospice facility ( or home with hospice if not deemed a candidate for residential hospice yet.) Care plan was discussed with patient. Son Darnelle Maffucci on the phone and also son Erlene Quan separately on the phone.   Thank you for allowing the Palliative Medicine Team to assist in the care of this patient.   Time In: 9.10 Time Out: 10.20 Total Time 70 min Prolonged Time Billed Yes.       Greater than 50%  of this time was spent counseling and coordinating care related to the above assessment and plan.  Loistine Chance, MD  Please contact Palliative Medicine Team phone at [phone] for questions and concerns.

## 2020-04-30 DIAGNOSIS — R52 Pain, unspecified: Secondary | ICD-10-CM | POA: Diagnosis not present

## 2020-04-30 DIAGNOSIS — Z7189 Other specified counseling: Secondary | ICD-10-CM | POA: Diagnosis not present

## 2020-04-30 DIAGNOSIS — R7401 Elevation of levels of liver transaminase levels: Secondary | ICD-10-CM

## 2020-04-30 DIAGNOSIS — C9202 Acute myeloblastic leukemia, in relapse: Secondary | ICD-10-CM | POA: Diagnosis not present

## 2020-04-30 DIAGNOSIS — E440 Moderate protein-calorie malnutrition: Secondary | ICD-10-CM | POA: Diagnosis not present

## 2020-04-30 DIAGNOSIS — D649 Anemia, unspecified: Secondary | ICD-10-CM | POA: Diagnosis not present

## 2020-04-30 LAB — COMPREHENSIVE METABOLIC PANEL
ALT: 27 U/L (ref 0–44)
AST: 100 U/L — ABNORMAL HIGH (ref 15–41)
Albumin: 3.2 g/dL — ABNORMAL LOW (ref 3.5–5.0)
Alkaline Phosphatase: 376 U/L — ABNORMAL HIGH (ref 38–126)
Anion gap: 9 (ref 5–15)
BUN: 7 mg/dL — ABNORMAL LOW (ref 8–23)
CO2: 30 mmol/L (ref 22–32)
Calcium: 9.5 mg/dL (ref 8.9–10.3)
Chloride: 105 mmol/L (ref 98–111)
Creatinine, Ser: 0.86 mg/dL (ref 0.44–1.00)
GFR, Estimated: >60 mL/min (ref >60)
Glucose, Bld: 111 mg/dL — ABNORMAL HIGH (ref 70–99)
Potassium: 3.3 mmol/L — ABNORMAL LOW (ref 3.5–5.1)
Sodium: 144 mmol/L (ref 135–145)
Total Bilirubin: 1.3 mg/dL — ABNORMAL HIGH (ref 0.3–1.2)
Total Protein: 5.6 g/dL — ABNORMAL LOW (ref 6.5–8.1)

## 2020-04-30 LAB — CBC
HCT: 27.1 % — ABNORMAL LOW (ref 36.0–46.0)
Hemoglobin: 9.3 g/dL — ABNORMAL LOW (ref 12.0–15.0)
MCH: 30.6 pg (ref 26.0–34.0)
MCHC: 34.3 g/dL (ref 30.0–36.0)
MCV: 89.1 fL (ref 80.0–100.0)
Platelets: 13 10*3/uL — CL (ref 150–400)
RBC: 3.04 MIL/uL — ABNORMAL LOW (ref 3.87–5.11)
RDW: 16.9 % — ABNORMAL HIGH (ref 11.5–15.5)
WBC: 19.6 10*3/uL — ABNORMAL HIGH (ref 4.0–10.5)
nRBC: 0.1 % (ref 0.0–0.2)

## 2020-04-30 LAB — BPAM PLATELET PHERESIS
Blood Product Expiration Date: 202204292359
ISSUE DATE / TIME: 202204271703
Unit Type and Rh: 6200

## 2020-04-30 LAB — PREPARE PLATELET PHERESIS: Unit division: 0

## 2020-04-30 NOTE — Progress Notes (Signed)
Patient's son, Darnelle Maffucci, updated of patient's condition over the phone. All Questions answered and Darnelle Maffucci will touch base with his brother, Erlene Quan. Erlene Quan is expected to be at the hospital by about noon tomorrow.

## 2020-04-30 NOTE — Progress Notes (Signed)
Daily Progress Note   Patient Name: Kelly Roberson       Date: 04/30/2020 DOB: March 19, 1960  Age: 60 y.o. MRN#: [id number] Attending Physician: Mariel Aloe, MD Primary Care Physician: Kristie Cowman, MD Admit Date: 04/20/2020  Reason for Consultation/Follow-up: Establishing goals of care  Subjective: Patient complains of pain, appears weak. Not eating much, patient with ongoing functional decline even since the past 2-3 days. Patient tells me that she wants to consider hospice services.     Length of Stay: 10  Current Medications: Scheduled Meds:  . acyclovir  400 mg Oral BID  . cholecalciferol  2,000 Units Oral Daily  . escitalopram  5 mg Oral Daily  . feeding supplement  237 mL Oral TID BM  . levofloxacin  500 mg Oral Daily  . multivitamin with minerals  1 tablet Oral Daily  . oxyCODONE  20 mg Oral Q12H  . pantoprazole  40 mg Oral Daily  . posaconazole  300 mg Oral Daily  . pregabalin  150 mg Oral BID  . senna  1 tablet Oral QHS  . terbinafine   Topical BID  . traZODone  50 mg Oral QHS    Continuous Infusions:   PRN Meds: ALPRAZolam, HYDROmorphone (DILAUDID) injection, ondansetron (ZOFRAN) IV, oxyCODONE  Physical Exam         Appears weaker, resting in bed.  Has lower extremity edema Regular work of breathing Flattened affect S1-S2 Abdomen is not distended  Vital Signs: BP (!) 150/60 (BP Location: Left Arm)   Pulse 84   Temp 99 F (37.2 C) (Oral)   Resp 17   Ht 5' 7.99" (1.727 m)   Wt 84 kg   SpO2 91%   BMI 28.16 kg/m  SpO2: SpO2: 91 % O2 Device: O2 Device: Room Air O2 Flow Rate: O2 Flow Rate (L/min): 2 L/min  Intake/output summary:   Intake/Output Summary (Last 24 hours) at 04/30/2020 1032 Last data filed at 04/30/2020 0502 Gross per 24 hour  Intake  454 ml  Output 750 ml  Net -296 ml   LBM: Last BM Date: 04/29/20 Baseline Weight: Weight: 84 kg Most recent weight: Weight: 84 kg      Palliative performance scale 30%. Palliative Assessment/Data:      Patient Active Problem List   Diagnosis Date Noted  . Palliative care  by specialist   . Goals of care, counseling/discussion   . Generalized pain   . Rash and nonspecific skin eruption 04/23/2020  . Malnutrition of moderate degree 04/21/2020  . Symptomatic anemia 04/20/2020  . Intractable headache 02/14/2020  . RLS (restless legs syndrome) 01/09/2020  . Neutropenic fever ([place])   . Thrombocytopenia ([place])   . Sepsis ([place]) 09/10/2019  . Severe sepsis ([place]) 09/09/2019  . Typhlitis   . Colitis 07/29/2019  . Hypokalemia 07/29/2019  . Pancytopenia ([place]) 07/29/2019  . Antineoplastic chemotherapy induced pancytopenia ([place]) 07/01/2019  . Pancytopenia due to antineoplastic chemotherapy ([place]) 07/01/2019  . Headache 07/01/2019  . Neutropenia ([place]) 07/01/2019  . Macrocytic anemia 05/01/2019  . AML (acute myeloblastic leukemia) ([place]) 05/01/2019  . Neck pain on right side 05/01/2019  . Leukocytosis 05/01/2019  . Bicytopenia 04/30/2019  . Macrocytosis without anemia 02/12/2019  . Family history of hemochromatosis 02/12/2019  . Mixed hyperlipidemia 01/18/2019  . Abnormal LFTs 01/18/2019  . Dandruff in adult 01/18/2019  . Hair loss 01/18/2019  . Muscle weakness 01/18/2019  . Peripheral neuropathy 01/18/2019  . Other fatigue 01/18/2019  . Essential hypertension 06/01/2018  . Palpitations 06/01/2018  . Anxiety disorder 06/01/2018  . Insomnia 06/01/2018  . Irritable bowel syndrome with diarrhea 06/01/2018  . Barrett's esophagus with dysplasia 06/01/2018    Palliative Care Assessment & Plan   Patient Profile:  Kelly Roberson a 60 y.o.femalewith medical history significant ofAcute Myeloid leukemia having been admitted to [facility] for the last 60 days. Was discharged home for 24h  before she presented to oncology for further evaluation for ongoing weakness. She declined admission to hospital at that point but now presents again to oncology for ongoing weakness who is requesting admission to the hospital. Comorbid conditions include - HTN, Anxiety, Hepatitis B/C, IBS, RLS.    Patient remains admitted to hospital medicine service and medical oncology is following.  Patient has also been visited by spiritual care.  Palliative medicine consultation for ongoing goals of care discussions has been requested.  Patient has medical history significant for AML has been admitted for worsening weakness and severe cytopenias.  At this stage, her cancer is found to be rapidly progressing and her blood counts are not able to be kept above transfusion threshold as per oncology.  She follows with [facility] as well.  Assessment: Palliative services following for CODE STATUS discussions, goals of care discussions, symptom management as well as discussions for arriving at most suitable disposition option for this patient. Functional decline Generalized pain   Recommendations/Plan: DNR DNI has now been established, discussed again with patient.  Hospice level of care: recommend residential hospice, patient with accelerated decline, could possibly have less than 2 weeks prognosis, in my opinion.  Medication history noted, continue IV Dilaudid for pain.   Goals of Care and Additional Recommendations:  Limitations on Scope of Treatment: Full Scope Treatment  Code Status:    Code Status Orders  (From admission, onward)         Start     Ordered   04/20/20 1612  Full code  Continuous        04/20/20 1612        Code Status History    Date Active Date Inactive Code Status Order ID Comments User Context   09/10/2019 0258 09/18/2019 1934 Full Code [id number]  Collier Bullock, MD ED   09/10/2019 0041 09/10/2019 0258 Full Code [id number]  Shela Leff, MD ED   07/29/2019  2322 08/03/2019 2010 Full Code  [id number]  Shela Leff, MD ED   07/01/2019 0413 07/03/2019 2059 Full Code [id number]  Neena Rhymes, MD ED   04/30/2019 2309 05/02/2019 0342 Full Code [id number]  Elwyn Reach, MD Inpatient   Advance Care Planning Activity       Prognosis:   ? 2weeks Could be much shorter, patient has high risk for ongoing decline and decompensation given her terminal illness.   Discharge Planning:  Hospice facility Care plan was discussed with patient RN and TOC.      Thank you for allowing the Palliative Medicine Team to assist in the care of this patient.   Time In: 9 Time Out: 9.35 Total Time 35 Prolonged Time Billed No       Greater than 50%  of this time was spent counseling and coordinating care related to the above assessment and plan.  Loistine Chance, MD  Please contact Palliative Medicine Team phone at [phone] for questions and concerns.

## 2020-04-30 NOTE — Progress Notes (Signed)
Patient continues to decline during this shift. She has become increasingly more drowsy despite only receiving one dose of dilaudid this shift. She has also refused all meals today and has had very little fluid intake. During evening rounds patient was found to be only 84-86% on room air and was placed on 3L oxygen [place]. Dr. Lonny Prude aware.

## 2020-04-30 NOTE — Progress Notes (Signed)
PROGRESS NOTE    Kelly Roberson  JOA:[id number] DOB: Dec 25, 1960 DOA: 04/20/2020 PCP: Kristie Cowman, MD   Brief Narrative: Kelly Roberson is a 60 y.o. female with a history of AML. Patient was recently admitted at [facility] for a 2 month admission. She presented this admission with weakness in setting of progressing AML. She has required multiple blood/platelet transfusions. Medical oncology consulted with no recommendations for continued chemotherapy/treatments for her leukemia and recommend hospice referral.   Assessment & Plan:   Principal Problem:   AML (acute myeloblastic leukemia) ([place]) Active Problems:   Neutropenia ([place])   Thrombocytopenia ([facility])   Symptomatic anemia   Malnutrition of moderate degree   Rash and nonspecific skin eruption   Palliative care by specialist   Goals of care, counseling/discussion   Generalized pain   AML Patient follows with oncology at [facility]. Medical oncology consulted while inpatient. After discussion with primary oncologist, recommendation for hospice with no treatment currently recommended for treatment. Palliative care medicine consulted for continued goals of care discussions. Now DNR with plans for discharge to residential hospice  Acute on chronic thrombocytopenia Chronic pancytopenia Neutropenia Medical oncology consulted. Secondary to above. Patient is requiring multiple/frequent transfusions of PRBC and platelets. She has received 1 unit of PRBC and 4 units of platelets to date. Medical oncology recommendation for transfusion if hemoglobin < 7.0 and platelets <10k. Platelets of 13k today after transfusion of 1 unit on 4/27 -Trend CBC  Leg rash Infectious disease consulted with concern for possible leukemia cutis with regard to leg rash. Possible tinea infection of proximal lower extremity/abdomen. ID recommended to treat with terbinafine x2 weeks in addition to continuing posaconazole.   Hypokalemia Hypomagnesemia -Supplementation as  needed  Elevated AST/ALP Increasing. No associated abdominal pain. Bilirubin normal. Patient is now planning for discharge to hospice facility.  Fall Prior to admission. PT/OT recommending SNF   DVT prophylaxis: SCDs Code Status:   Code Status: DNR Family Communication: None at bedside Disposition Plan: Discharge to hospice facility if accepted   Consultants:   Medical oncology  Palliative care medicine  Procedures:   None  Antimicrobials:  Acyclovir  Levaquin  Posaconazole    Subjective: Continues to have significant pain.  Objective: Vitals:   04/29/20 1812 04/29/20 2005 04/30/20 0458 04/30/20 0724  BP:  (!) 142/70 (!) 163/74 (!) 150/60  Pulse: 78 87 98 84  Resp: 17 16 18 17   Temp: 97.8 F (36.6 C) 99.2 F (37.3 C) 98.9 F (37.2 C) 99 F (37.2 C)  TempSrc: Oral Oral Oral Oral  SpO2: 92% 90% 93% 91%  Weight:      Height:        Intake/Output Summary (Last 24 hours) at 04/30/2020 1142 Last data filed at 04/30/2020 0502 Gross per 24 hour  Intake 454 ml  Output 750 ml  Net -296 ml   Filed Weights   04/20/20 1500  Weight: 84 kg    Examination:  General exam: Appears calm and in slight discomfort Respiratory system: Clear to auscultation. Respiratory effort normal. Cardiovascular system: S1 & S2 heard, RRR. No murmurs, rubs, gallops or clicks. Gastrointestinal system: Abdomen is nondistended, soft and nontender. No organomegaly or masses felt. Normal bowel sounds heard. Central nervous system: Alert Musculoskeletal: LE edema. No calf tenderness   Data Reviewed: I have personally reviewed following labs and imaging studies  CBC Lab Results  Component Value Date   WBC 19.6 (H) 04/30/2020   RBC 3.04 (L) 04/30/2020   HGB 9.3 (L) 04/30/2020  HCT 27.1 (L) 04/30/2020   MCV 89.1 04/30/2020   MCH 30.6 04/30/2020   PLT 13 (LL) 04/30/2020   MCHC 34.3 04/30/2020   RDW 16.9 (H) 04/30/2020   LYMPHSABS 4.4 (H) 04/29/2020   MONOABS 0.0 (L)  04/29/2020   EOSABS 0.0 04/29/2020   BASOSABS 0.0 16/10/9602     Last metabolic panel Lab Results  Component Value Date   NA 144 04/30/2020   K 3.3 (L) 04/30/2020   CL 105 04/30/2020   CO2 30 04/30/2020   BUN 7 (L) 04/30/2020   CREATININE 0.86 04/30/2020   GLUCOSE 111 (H) 04/30/2020   GFRNONAA >60 04/30/2020   GFRAA >60 10/04/2019   CALCIUM 9.5 04/30/2020   PHOS 3.1 04/29/2020   PROT 5.6 (L) 04/30/2020   ALBUMIN 3.2 (L) 04/30/2020   LABGLOB 2.8 01/10/2020   AGRATIO 1.5 01/17/2019   BILITOT 1.3 (H) 04/30/2020   ALKPHOS 376 (H) 04/30/2020   AST 100 (H) 04/30/2020   ALT 27 04/30/2020   ANIONGAP 9 04/30/2020    CBG (last 3)  No results for input(s): GLUCAP in the last 72 hours.   GFR: Estimated Creatinine Clearance: 78 mL/min (by C-G formula based on SCr of 0.86 mg/dL).  Coagulation Profile: No results for input(s): INR, PROTIME in the last 168 hours.  Recent Results (from the past 240 hour(s))  SARS CORONAVIRUS 2 (TAT 6-24 HRS) Nasopharyngeal Nasopharyngeal Swab     Status: None   Collection Time: 04/21/20  1:37 AM   Specimen: Nasopharyngeal Swab  Result Value Ref Range Status   SARS Coronavirus 2 NEGATIVE NEGATIVE Final    Comment: (NOTE) SARS-CoV-2 target nucleic acids are NOT DETECTED.  The SARS-CoV-2 RNA is generally detectable in upper and lower respiratory specimens during the acute phase of infection. Negative results do not preclude SARS-CoV-2 infection, do not rule out co-infections with other pathogens, and should not be used as the sole basis for treatment or other patient management decisions. Negative results must be combined with clinical observations, patient history, and epidemiological information. The expected result is Negative.  Fact Sheet for Patients: SugarRoll.be  Fact Sheet for Healthcare Providers: https://www.woods-mathews.com/  This test is not yet approved or cleared by the [place]  FDA and  has been authorized for detection and/or diagnosis of SARS-CoV-2 by FDA under an Emergency Use Authorization (EUA). This EUA will remain  in effect (meaning this test can be used) for the duration of the COVID-19 declaration under Se ction 564(b)(1) of the Act, 21 U.S.C. section 360bbb-3(b)(1), unless the authorization is terminated or revoked sooner.  Performed at [facility], [place] [address], [place], [place] [address]         Radiology Studies: No results found.      Scheduled Meds: . acyclovir  400 mg Oral BID  . cholecalciferol  2,000 Units Oral Daily  . escitalopram  5 mg Oral Daily  . feeding supplement  237 mL Oral TID BM  . levofloxacin  500 mg Oral Daily  . multivitamin with minerals  1 tablet Oral Daily  . oxyCODONE  20 mg Oral Q12H  . pantoprazole  40 mg Oral Daily  . posaconazole  300 mg Oral Daily  . pregabalin  150 mg Oral BID  . senna  1 tablet Oral QHS  . terbinafine   Topical BID  . traZODone  50 mg Oral QHS   Continuous Infusions:   LOS: 10 days     Cordelia Poche, MD [facility] 04/30/2020, 11:42  AM  If 7PM-7AM, please contact night-coverage [email or web address]

## 2020-04-30 NOTE — TOC Progression Note (Signed)
Transition of Care [facility]) - Progression Note    Patient Details  Name: Kelly Roberson MRN: [id number] Date of Birth: 1960/07/31  Transition of [facility]) CM/SW Contact  Ninette Cotta, Marjie Skiff, RN Phone Number: 04/30/2020, 2:35 PM  Clinical Narrative:    Spoke with son Kelly Roberson via phone for dc planning. Per Kelly Roberson the family goal is to get the pt to either a [facility] residential hospice facility or a [facility] facility. First choice being [facility] where son Kelly Roberson lives. Referral sent to [facility] (phone: [phone] Fax: [phone]). Awaiting call back to see if pt is appropriate for their inpatient hospice facility.   Transportation would need to be by ambulance and would be private pay. Estimate received from [facility] that transport to [place] would be $3,873.72. Transport to [facility] would be around $2,141.40. Both of these amounts were shared with Kelly Roberson when he called this CM for an update. Kelly Roberson to come into town tomorrow to begin handling pt affairs. Will meet with Kelly Roberson tomorrow to continue with dc planning.   Expected Discharge Plan: [place] Barriers to Discharge: Continued Medical Work up  Expected Discharge Plan and Services Expected Discharge Plan: [place] In-house Referral: Clinical Social Work   Post Acute Care Choice: [place] Living arrangements for the past 2 months: [place]: PT,Nurse's Aide [facility] Agency: [facility] [place] Date [place]: 04/22/20 Time [place]: 1130 Representative spoke with at [place]: [place] ([place]) Interventions    Readmission Risk Interventions No flowsheet data found.

## 2020-05-01 DIAGNOSIS — Z7189 Other specified counseling: Secondary | ICD-10-CM | POA: Diagnosis not present

## 2020-05-01 DIAGNOSIS — C9200 Acute myeloblastic leukemia, not having achieved remission: Principal | ICD-10-CM

## 2020-05-01 DIAGNOSIS — Z515 Encounter for palliative care: Secondary | ICD-10-CM

## 2020-05-01 DIAGNOSIS — C9202 Acute myeloblastic leukemia, in relapse: Secondary | ICD-10-CM | POA: Diagnosis not present

## 2020-05-01 DIAGNOSIS — R52 Pain, unspecified: Secondary | ICD-10-CM | POA: Diagnosis not present

## 2020-05-01 DIAGNOSIS — D649 Anemia, unspecified: Secondary | ICD-10-CM | POA: Diagnosis not present

## 2020-05-01 DIAGNOSIS — E440 Moderate protein-calorie malnutrition: Secondary | ICD-10-CM | POA: Diagnosis not present

## 2020-05-01 DIAGNOSIS — D696 Thrombocytopenia, unspecified: Secondary | ICD-10-CM | POA: Diagnosis not present

## 2020-05-01 LAB — CBC
HCT: 22.5 % — ABNORMAL LOW (ref 36.0–46.0)
Hemoglobin: 7.3 g/dL — ABNORMAL LOW (ref 12.0–15.0)
MCH: 29.8 pg (ref 26.0–34.0)
MCHC: 32.4 g/dL (ref 30.0–36.0)
MCV: 91.8 fL (ref 80.0–100.0)
Platelets: 7 10*3/uL — CL (ref 150–400)
RBC: 2.45 MIL/uL — ABNORMAL LOW (ref 3.87–5.11)
RDW: 16.9 % — ABNORMAL HIGH (ref 11.5–15.5)
WBC: 17.7 10*3/uL — ABNORMAL HIGH (ref 4.0–10.5)
nRBC: 0.2 % (ref 0.0–0.2)

## 2020-05-01 MED ORDER — SODIUM CHLORIDE 0.9 % IV SOLN
1.0000 mg/h | INTRAVENOUS | Status: DC
  Administered 2020-05-01: 1 mg/h via INTRAVENOUS
  Filled 2020-05-01: qty 5

## 2020-05-01 MED ORDER — HYDROMORPHONE HCL 1 MG/ML IJ SOLN
0.5000 mg | INTRAMUSCULAR | Status: DC | PRN
  Administered 2020-05-01: 0.5 mg via INTRAVENOUS

## 2020-05-01 NOTE — Progress Notes (Signed)
Spiritual Care Note  Responded to [facility] page requesting chaplain visit per patient and family. Ms Kelly Roberson was drowsy due to pain meds. Sons Kelly Roberson and [place] (and Brandon's wife) were at bedside. They were seeking notary to assist with will; however, a [place] notary can only assist with Advance Directives (living will and healthcare power of attorney). Provided pastoral presence, emotional support, affirmation of strengths, and clarification of policy. They plan to have a mobile notary meet with Ms Harm at her future hospice location during a strategic break in her pain meds to allow for maximum alertness and mental clarity, and they are aware of hospice resources for further spiritual/emotional support.   [place], [place], [facility] M-F daytime pager [phone] [facility] 24/7 pager [phone] Voicemail [phone]

## 2020-05-01 NOTE — Progress Notes (Signed)
Report given to Arbie Cookey, RN at hospice of the [place]. Awaiting [facility]

## 2020-05-01 NOTE — Progress Notes (Signed)
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 ED with progressive weakness. Found to have pancytopenia and peripheral blasts with intracellular inclusions.  2) 05/01/2019: transferred to [facility] 3) 05/04/2019: patient started induction therapy for AML with 7+3 and gemtuzumab on Day 4. 4) 05/16/2019: repeat Bmbx shows no residual leukemia 5) 06/17/2019: patient returned for Cycle 1 Consolidation with cytarabine and gemtuzumab 6) 06/24/2019: presents to reconnect with [facility] for co-managed care. 7) 07/15/2019: Cycle 2 of consolidation therapy  8) 07/30/2019: admitted for BRBPR/ Infectious colitis.  9) 08/26/2019: start of Cycle 3 of consolidation therapy at [facility]  10) 09/09/2019: admitted withneutropenicfever and BRBPR 11) 10/23/2019: evaluated by Dr. Elmo Putt. After discussion with him decided against oral azacitidine therapy 12) 02/17/2020-04/15/2020: admitted to [place] service. Treated with Decitabine/Venetoclax x 2 cycles  Admission History: Kelly Roberson 60 y.o. female with medical history significant for AML who was admitted for worsening weakness and severe cytopenias.  Her cancer is found to be rapidly progressing and her blood counts are not able to be kept above transfusion threshold.  Interval History:  --blast count rising, Hgb and Plt remain low --discussed her care with Dr. Elmo Putt at [facility]. No further options available. -- After talking with Dr. Tana Felts office, the palliative care team, and her sons, she is now accepting of her terminal diagnosis. --Has decided to pursue hospice with placement at residential hospice. -- She is having increased hip pain today and requiring frequent pain  medication IV. -- She developed worsening shortness of breath overnight and is now on oxygen.  MEDICAL HISTORY:  Past Medical History:  Diagnosis Date  . Anxiety   . Cancer ([place])   . Hepatitis B   . Hepatitis C   . Hypertension   . IBS (irritable bowel syndrome)   . RLS (restless legs syndrome) 01/09/2020    SURGICAL HISTORY: Past Surgical History:  Procedure Laterality Date  . GASTRIC BYPASS  2013  . NISSEN FUNDOPLICATION  0263   Reversed in 2013  . ORIF ANKLE FRACTURE Left 06/2017  . PLACEMENT OF BREAST IMPLANTS      SOCIAL HISTORY: Social History   Socioeconomic History  . Marital status: Divorced    Spouse name: Not on file  . Number of children: Not on file  . Years of education: Not on file  . Highest education level: Not on file  Occupational History  . Occupation: disability  Tobacco Use  . Smoking status: Former Research scientist (life sciences)  . Smokeless tobacco: Never Used  Vaping Use  . Vaping Use: Never used  Substance and Sexual Activity  . Alcohol use: Not Currently    Comment: occasionally   . Drug use: Not Currently  . Sexual activity: Not Currently  Other Topics Concern  . Not on file  Social History Narrative   Lives alone   Right Handed   Drinks 2-3 cups caffeine daily   Social Determinants of Health   Financial Resource Strain: Not on file  Food Insecurity: Not on file  Transportation Needs: Not on file  Physical Activity: Not on file  Stress: Not on file  Social Connections: Not on file  Intimate Partner Violence: Not on file    FAMILY HISTORY: Family History  Problem Relation Age of Onset  . Pancreatic cancer Father   .  Colon cancer Maternal Aunt   . Diabetes Maternal Uncle   . Pancreatic cancer Maternal Grandfather     ALLERGIES:  is allergic to ace inhibitors, lisinopril, and tylenol [acetaminophen].  MEDICATIONS:  Current Facility-Administered Medications  Medication Dose Route Frequency Provider Last Rate Last Admin  . acyclovir (ZOVIRAX)  tablet 400 mg  400 mg Oral BID Azucena Fallen, MD   400 mg at 04/30/20 2253  . ALPRAZolam Prudy Feeler) tablet 0.5 mg  0.5 mg Oral TID PRN Marguerita Merles Latif, DO   0.5 mg at 04/29/20 0101  . escitalopram (LEXAPRO) tablet 5 mg  5 mg Oral Daily Azucena Fallen, MD   5 mg at 04/29/20 1016  . feeding supplement (ENSURE ENLIVE / ENSURE PLUS) liquid 237 mL  237 mL Oral TID BM Azucena Fallen, MD   237 mL at 04/30/20 2100  . HYDROmorphone (DILAUDID) injection 0.5 mg  0.5 mg Intravenous Q3H PRN Rosalin Hawking, MD   0.5 mg at 04/30/20 1100  . levofloxacin (LEVAQUIN) tablet 500 mg  500 mg Oral Daily Kathlynn Grate, DO   500 mg at 04/29/20 1016  . ondansetron (ZOFRAN) injection 4 mg  4 mg Intravenous Q6H PRN Opyd, Lavone Neri, MD   4 mg at 04/30/20 2025  . oxyCODONE (Oxy IR/ROXICODONE) immediate release tablet 5 mg  5 mg Oral Q4H PRN Marguerita Merles [place], DO   5 mg at 05/01/20 0756  . oxyCODONE (OXYCONTIN) 12 hr tablet 20 mg  20 mg Oral Q12H Azucena Fallen, MD   20 mg at 04/30/20 2253  . posaconazole (NOXAFIL) delayed-release tablet 300 mg  300 mg Oral Daily Azucena Fallen, MD   300 mg at 04/30/20 1005  . pregabalin (LYRICA) capsule 150 mg  150 mg Oral BID Azucena Fallen, MD   150 mg at 04/30/20 2253  . senna (SENOKOT) tablet 8.6 mg  1 tablet Oral QHS Rosalin Hawking, MD   8.6 mg at 04/29/20 2224  . terbinafine (LAMISIL) 1 % cream   Topical BID Kathlynn Grate, DO   Given at 04/30/20 2259  . traZODone (DESYREL) tablet 50 mg  50 mg Oral QHS Azucena Fallen, MD   50 mg at 04/29/20 2224    REVIEW OF SYSTEMS:   Constitutional: ( - ) fevers, ( - )  chills , ( - ) night sweats Eyes: ( - ) blurriness of vision, ( - ) double vision, ( - ) watery eyes Ears, nose, mouth, throat, and face: ( - ) mucositis, ( - ) sore throat Respiratory: ( - ) cough, ( - ) dyspnea, ( - ) wheezes Cardiovascular: ( - ) palpitation, ( - ) chest discomfort, ( - ) lower extremity swelling Gastrointestinal:  (  - ) nausea, ( - ) heartburn, ( - ) change in bowel habits Skin: ( - ) abnormal skin rashes Lymphatics: ( - ) new lymphadenopathy, ( - ) easy bruising Neurological: ( - ) numbness, ( - ) tingling, ( - ) new weaknesses Behavioral/Psych: ( - ) mood change, ( - ) new changes  All other systems were reviewed with the patient and are negative.  PHYSICAL EXAMINATION:  Vitals:   04/30/20 2022 05/01/20 0500  BP: (!) 148/64 (!) 154/66  Pulse: 87 89  Resp: 16 20  Temp: 98.6 F (37 C) 98.5 F (36.9 C)  SpO2: 95% 100%   Filed Weights   04/20/20 1500  Weight: 84 kg    GENERAL: chronically ill appearing middle  aged Caucasian female, she is alert, slow to find her words at times, appears to be in pain SKIN: Ecchymoses near her eyes, splotchy violaceous lesion on lower extremities bilaterally.  EYES: conjunctiva are pink and non-injected, sclera clear OROPHARYNX: Lips with dried blood and blood clots in her mouth and posterior pharynx LUNGS: clear to auscultation and percussion with normal breathing effort HEART: regular rate & rhythm and no murmurs and no lower extremity edema Musculoskeletal: no cyanosis of digits and no clubbing  PSYCH: alert & oriented x 3, slow to find her words at times NEURO: no focal motor/sensory deficits  LABORATORY DATA:  I have reviewed the data as listed CBC Latest Ref Rng & Units 05/01/2020 04/30/2020 04/29/2020  WBC 4.0 - 10.5 K/uL 17.7(H) 19.6(H) 14.7(H)  Hemoglobin 12.0 - 15.0 g/dL 7.3(L) 9.3(L) 7.7(L)  Hematocrit 36.0 - 46.0 % 22.5(L) 27.1(L) 22.6(L)  Platelets 150 - 400 K/uL 7(LL) 13(LL) 8(LL)    CMP Latest Ref Rng & Units 04/30/2020 04/29/2020 04/28/2020  Glucose 70 - 99 mg/dL 111(H) 100(H) 101(H)  BUN 8 - 23 mg/dL 7(L) 6(L) 6(L)  Creatinine 0.44 - 1.00 mg/dL 0.86 0.91 0.84  Sodium 135 - 145 mmol/L 144 140 140  Potassium 3.5 - 5.1 mmol/L 3.3(L) 3.5 3.2(L)  Chloride 98 - 111 mmol/L 105 103 103  CO2 22 - 32 mmol/L 30 29 28   Calcium 8.9 - 10.3 mg/dL 9.5  8.9 9.1  Total Protein 6.5 - 8.1 g/dL 5.6(L) 5.2(L) 6.0(L)  Total Bilirubin 0.3 - 1.2 mg/dL 1.3(H) 1.0 0.9  Alkaline Phos 38 - 126 U/L 376(H) 308(H) 242(H)  AST 15 - 41 U/L 100(H) 85(H) 69(H)  ALT 0 - 44 U/L 27 26 20     Lab Results  Component Value Date   MPROTEIN Not Observed 01/10/2020   No results found for: KPAFRELGTCHN, LAMBDASER, KAPLAMBRATIO   BLOOD FILM: 04/24/2020: Review of the peripheral blood smear showed few white cells with most being blast cells. Red cells show no anisopoikilocytosis, macrocytes , microcytes or polychromasia. There were no schistocytes, target cells, echinocytes, acanthocytes, dacrocytes, or stomatocytes.There was no rouleaux formation, nucleated red cells, or intra-cellular inclusions noted. The platelets are normal in size, shape, and color without any clumping evident.  RADIOGRAPHIC STUDIES: I have personally reviewed the radiological images as listed and agreed with the findings in the report. DG Shoulder Right  Result Date: 04/16/2020 CLINICAL DATA:  Fall, pain, bruising EXAM: RIGHT SHOULDER - 2+ VIEW; RIGHT HUMERUS - 2+ VIEW; RIGHT FOREARM - 2 VIEW COMPARISON:  None. FINDINGS: No fracture or dislocation of the right shoulder. Mild acromioclavicular and glenohumeral arthrosis. The partially included right chest is unremarkable. No fracture or dislocation of the right humerus. No fracture or dislocation of the right radius or ulna. IMPRESSION: 1. No fracture or dislocation of the right shoulder. Mild acromioclavicular and glenohumeral arthrosis. 2. No fracture or dislocation of the right humerus. 3. No fracture or dislocation of the right radius or ulna. Electronically Signed   By: Eddie Candle M.D.   On: 04/16/2020 15:51   DG Forearm Right  Result Date: 04/16/2020 CLINICAL DATA:  Fall, pain, bruising EXAM: RIGHT SHOULDER - 2+ VIEW; RIGHT HUMERUS - 2+ VIEW; RIGHT FOREARM - 2 VIEW COMPARISON:  None. FINDINGS: No fracture or dislocation of the right  shoulder. Mild acromioclavicular and glenohumeral arthrosis. The partially included right chest is unremarkable. No fracture or dislocation of the right humerus. No fracture or dislocation of the right radius or ulna. IMPRESSION: 1. No fracture or  dislocation of the right shoulder. Mild acromioclavicular and glenohumeral arthrosis. 2. No fracture or dislocation of the right humerus. 3. No fracture or dislocation of the right radius or ulna. Electronically Signed   By: Eddie Candle M.D.   On: 04/16/2020 15:51   CT Head Wo Contrast  Result Date: 04/16/2020 CLINICAL DATA:  Fall, hematoma to right eyebrow EXAM: CT HEAD WITHOUT CONTRAST TECHNIQUE: Contiguous axial images were obtained from the base of the skull through the vertex without intravenous contrast. COMPARISON:  None. FINDINGS: Brain: No evidence of acute infarction, hemorrhage, hydrocephalus, extra-axial collection or mass lesion/mass effect. Vascular: No hyperdense vessel or unexpected calcification. Skull: Normal. Negative for fracture or focal lesion. Sinuses/Orbits: No acute finding. Other: Soft tissue contusion of the right brow (series 3, image 5). IMPRESSION: 1. No acute intracranial pathology. 2. Soft tissue contusion of the right brow. Electronically Signed   By: Eddie Candle M.D.   On: 04/16/2020 15:06   DG Humerus Right  Result Date: 04/16/2020 CLINICAL DATA:  Fall, pain, bruising EXAM: RIGHT SHOULDER - 2+ VIEW; RIGHT HUMERUS - 2+ VIEW; RIGHT FOREARM - 2 VIEW COMPARISON:  None. FINDINGS: No fracture or dislocation of the right shoulder. Mild acromioclavicular and glenohumeral arthrosis. The partially included right chest is unremarkable. No fracture or dislocation of the right humerus. No fracture or dislocation of the right radius or ulna. IMPRESSION: 1. No fracture or dislocation of the right shoulder. Mild acromioclavicular and glenohumeral arthrosis. 2. No fracture or dislocation of the right humerus. 3. No fracture or dislocation of the  right radius or ulna. Electronically Signed   By: Eddie Candle M.D.   On: 04/16/2020 15:51   US PELVIC COMPLETE WITH TRANSVAGINAL  Result Date: 04/23/2020 CLINICAL DATA:  Vaginal bleeding EXAM: TRANSABDOMINAL AND TRANSVAGINAL ULTRASOUND OF PELVIS TECHNIQUE: Both transabdominal and transvaginal ultrasound examinations of the pelvis were performed. Transabdominal technique was performed for global imaging of the pelvis including uterus, ovaries, adnexal regions, and pelvic cul-de-sac. It was necessary to proceed with endovaginal exam following the transabdominal exam to attempt to visualize the uterus endometrium ovaries. COMPARISON:  CT 09/09/2019 FINDINGS: Uterus Transvaginal images or markedly limited by patient pain tolerance and shadowing. Measurements: 8.9 x 4.5 x 5.8 cm = volume: 114.7 mL. Diffuse echogenic foci throughout the myometrium. Endometrium Thickness: 4.4 mm.  No focal abnormality visualized. Right ovary Measurements: 2.5 x 2.1 x 2 cm = volume: 5.4 mL. Normal appearance/no adnexal mass. Left ovary Measurements: 2.6 x 2.1 x 2.2 cm = volume: 6.3 mL. Normal appearance/no adnexal mass. Other findings No abnormal free fluid. IMPRESSION: 1. Limited study. Transvaginal images of limited utility due to pain. 2. Post menopausal endometrial thickness grossly within normal limits. 3. Heterogeneous myometrial echotexture with numerous echogenic foci indeterminate for microcalcifications from prior inflammation or infection versus vascular calcification versus echogenic foci associated with adenomyosis though no adequate transvaginal images are available. Depending upon clinical course, evaluation with pelvic MRI could be considered. Electronically Signed   By: Donavan Foil M.D.   On: 04/23/2020 19:01    ASSESSMENT & PLAN Kelly Roberson 60 y.o. female with medical history significant for AML who was admitted for worsening weakness and severe cytopenias.  # Increasing Blast Count # Progression of  Leukemia # Lower Extremity Rash, potentially Leukemia cutis -- Unfortunately her CBC shows increasing blasts which is a major driving force behind her increasing white blood cell count -- Findings are strongly consistent with progression of disease.  Discussed her care with Dr. Lysle Dingwall [facility].  He emphasized that there were no additional treatments available for this patient and that hospice referral was most appropriate. -- no other options are available, we will need to consider comfort based care/hospice -- Findings on lower extremities may represent leukemia cutis with deposition of the blast cells within the skin. -- I discussed the lack of treatment options moving forward with the patient and her son.   -- The patient has had a rapid decline in her performance status. -- The patient is excepting of her terminal diagnosis and has agreed to hospice.  Anticipate discharge to residential hospice as soon as a bed is available. --Discussed case with palliative care and they anticipate that her family will be here later today and at that time, they plan to start her on a drip for pain medication. --Since the patient has opted to pursue comfort measures only, no transfusions will be ordered at this time.  Mikey Bussing, DNP, AGPCNP-BC, AOCNP  05/01/2020 10:26 AM

## 2020-05-01 NOTE — Progress Notes (Signed)
PROGRESS NOTE    Kelly Roberson  PIR:[id number] DOB: [birth year] DOA: 04/20/2020 PCP: Kristie Cowman, MD   Brief Narrative: Kelly Roberson is a 60 y.o. female with a history of AML. Patient was recently admitted at [facility] for a 2 month admission. She presented this admission with weakness in setting of progressing AML. She has required multiple blood/platelet transfusions. Medical oncology consulted with no recommendations for continued chemotherapy/treatments for her leukemia and recommend hospice referral.   Assessment & Plan:   Principal Problem:   AML (acute myeloblastic leukemia) ([place]) Active Problems:   Neutropenia ([place])   Thrombocytopenia ([facility])   Symptomatic anemia   Malnutrition of moderate degree   Rash and nonspecific skin eruption   Palliative care by specialist   Goals of care, counseling/discussion   Generalized pain   AML Patient follows with oncology at [facility]. Medical oncology consulted while inpatient. After discussion with primary oncologist, recommendation for hospice with no treatment currently recommended for treatment. Palliative care medicine consulted for continued goals of care discussions. Now DNR with plans for discharge to residential hospice. Full comfort measures. -Palliative care recommendations: dilaudid drip  Acute on chronic thrombocytopenia Chronic pancytopenia Neutropenia Medical oncology consulted. Secondary to above. Patient is requiring multiple/frequent transfusions of PRBC and platelets. She has received 1 unit of PRBC and 4 units of platelets to date. Medical oncology recommendation for transfusion if hemoglobin < 7.0 and platelets <10k. Platelets of 13k today after transfusion of 1 unit on 4/27. Full comfort measures now.  Leg rash Infectious disease consulted with concern for possible leukemia cutis with regard to leg rash. Possible tinea infection of proximal lower extremity/abdomen. ID recommended to treat with terbinafine x2 weeks in  addition to continuing posaconazole.   Hypokalemia Hypomagnesemia Supplementation prescribed as needed. Now full comfort measures  Elevated AST/ALP Increasing. No associated abdominal pain. Bilirubin normal. Patient is now planning for discharge to hospice facility.  Fall Prior to admission. PT/OT recommending SNF. Patient with plan to discharge to hospice facility.   DVT prophylaxis: SCDs Code Status:   Code Status: DNR Family Communication: None at bedside Disposition Plan: Discharge to hospice facility when bed is available.   Consultants:   Medical oncology  Palliative care medicine  Procedures:   None  Antimicrobials:  Acyclovir  Levaquin  Posaconazole    Subjective: Patient states she is thirst. Patient is in pain. Patient voices rhetoric consistent with full comfort measures, stating that she just wants to rest; this was confirmed with her as meaning she is ready to die.   Objective: Vitals:   04/30/20 1545 04/30/20 1547 04/30/20 2022 05/01/20 0500  BP:   (!) 148/64 (!) 154/66  Pulse:  84 87 89  Resp:  16 16 20   Temp:  99 F (37.2 C) 98.6 F (37 C) 98.5 F (36.9 C)  TempSrc:  Oral Oral Oral  SpO2: (!) 84% 97% 95% 100%  Weight:      Height:        Intake/Output Summary (Last 24 hours) at 05/01/2020 1251 Last data filed at 05/01/2020 0600 Gross per 24 hour  Intake 360 ml  Output 500 ml  Net -140 ml   Filed Weights   04/20/20 1500  Weight: 84 kg    Examination:  General exam: Appears calm and slightly uncomfortable HEENT: oropharynx with dried blood. Lips with dried blood Respiratory system: Clear to auscultation. Respiratory effort normal. Cardiovascular system: S1 & S2 heard, RRR. No murmurs, rubs, gallops or clicks. Gastrointestinal system: Abdomen is nondistended, soft  and nontender. Decreased bowel sounds heard. Central nervous system: Alert and oriented. No focal neurological deficits. Musculoskeletal: BLE edema. Psychiatry:  Depressed mood. Flat affect   Data Reviewed: I have personally reviewed following labs and imaging studies  CBC Lab Results  Component Value Date   WBC 17.7 (H) 05/01/2020   RBC 2.45 (L) 05/01/2020   HGB 7.3 (L) 05/01/2020   HCT 22.5 (L) 05/01/2020   MCV 91.8 05/01/2020   MCH 29.8 05/01/2020   PLT 7 (LL) 05/01/2020   MCHC 32.4 05/01/2020   RDW 16.9 (H) 05/01/2020   LYMPHSABS 4.4 (H) 04/29/2020   MONOABS 0.0 (L) 04/29/2020   EOSABS 0.0 04/29/2020   BASOSABS 0.0 53/64/6803     Last metabolic panel Lab Results  Component Value Date   NA 144 04/30/2020   K 3.3 (L) 04/30/2020   CL 105 04/30/2020   CO2 30 04/30/2020   BUN 7 (L) 04/30/2020   CREATININE 0.86 04/30/2020   GLUCOSE 111 (H) 04/30/2020   GFRNONAA >60 04/30/2020   GFRAA >60 10/04/2019   CALCIUM 9.5 04/30/2020   PHOS 3.1 04/29/2020   PROT 5.6 (L) 04/30/2020   ALBUMIN 3.2 (L) 04/30/2020   LABGLOB 2.8 01/10/2020   AGRATIO 1.5 01/17/2019   BILITOT 1.3 (H) 04/30/2020   ALKPHOS 376 (H) 04/30/2020   AST 100 (H) 04/30/2020   ALT 27 04/30/2020   ANIONGAP 9 04/30/2020    CBG (last 3)  No results for input(s): GLUCAP in the last 72 hours.   GFR: Estimated Creatinine Clearance: 78 mL/min (by C-G formula based on SCr of 0.86 mg/dL).  Coagulation Profile: No results for input(s): INR, PROTIME in the last 168 hours.  No results found for this or any previous visit (from the past 240 hour(s)).      Radiology Studies: No results found.      Scheduled Meds: . acyclovir  400 mg Oral BID  . feeding supplement  237 mL Oral TID BM  . pregabalin  150 mg Oral BID  . senna  1 tablet Oral QHS  . terbinafine   Topical BID  . traZODone  50 mg Oral QHS   Continuous Infusions: . HYDROmorphone       LOS: 11 days     Cordelia Poche, MD [facility] 05/01/2020, 12:51 PM  If 7PM-7AM, please contact night-coverage [email or web address]

## 2020-05-01 NOTE — Discharge Summary (Signed)
Physician Discharge Summary  Kelly Roberson FGH:[id number] DOB: 10-07-1960 DOA: 04/20/2020  PCP: Kristie Cowman, MD  Admit date: 04/20/2020 Discharge date: 05/01/2020  Admitted From: Home Disposition: Hospice facility  Discharge Condition: Comfort measures CODE STATUS: DNR Diet recommendation: Comfort diet; as tolerated   Brief/Interim Summary:  Admission HPI written by Little Ishikawa, MD   HPI: Kelly Roberson is a 60 y.o. female with medical history significant of Acute Myeloid leukemia having been admitted to [facility] for the last 60 days. Was discharged home for 24h before she presented to oncology for further evaluation for ongoing weakness. She declined admission to hospital at that point but now presents again to oncology for ongoing weakness who is requesting admission to the hospital. Comorbid conditions include - HTN, Anxiety, Hepatitis B/C, IBS, RLS.    Hospital course:  AML Patient follows with oncology at [facility]. Medical oncology consulted while inpatient. After discussion with primary oncologist, recommendation for hospice with no treatment currently recommended for treatment. Palliative care medicine consulted for continued goals of care discussions. Now DNR with plans for discharge to residential hospice. Dilaudid drip initiated. Full comfort measures.  Acute on chronic thrombocytopenia Chronic pancytopenia Neutropenia Medical oncology consulted. Secondary to above. Patient is requiring multiple/frequent transfusions of PRBC and platelets. She has received 1 unit of PRBC and 4 units of platelets to date. Medical oncology recommendation for transfusion if hemoglobin < 7.0 and platelets <10k. Platelets of 13k today after transfusion of 1 unit on 4/27. Full comfort measures now.  Leg rash Infectious disease consulted with concern for possible leukemia cutis with regard to leg rash. Possible tinea infection of proximal lower extremity/abdomen. ID recommended to treat  with terbinafine x2 weeks in addition to continuing posaconazole.   Hypokalemia Hypomagnesemia Supplementation prescribed as needed. Now full comfort measures  Elevated AST/ALP Increasing. No associated abdominal pain. Bilirubin normal. Patient is now planning for discharge to hospice facility.  Fall Prior to admission. PT/OT recommending SNF. Patient with plan to discharge to hospice facility.  Discharge Diagnoses:  Principal Problem:   AML (acute myeloblastic leukemia) ([place]) Active Problems:   Neutropenia (HCC)   Thrombocytopenia (HCC)   Symptomatic anemia   Malnutrition of moderate degree   Rash and nonspecific skin eruption   Palliative care by specialist   Goals of care, counseling/discussion   Generalized pain   Comfort measures only status    Discharge Instructions   Allergies as of 05/01/2020      Reactions   Ace Inhibitors    Other reaction(s): Cough   Lisinopril Cough   Tylenol [acetaminophen] Hives, Itching   Pt. States she only itches from tylenol       Medication List    STOP taking these medications   acyclovir 400 MG tablet Commonly known as: ZOVIRAX   ALPRAZolam 0.5 MG tablet Commonly known as: XANAX   cholecalciferol 25 MCG (1000 UNIT) tablet Commonly known as: VITAMIN D   escitalopram 10 MG tablet Commonly known as: LEXAPRO   oxyCODONE 20 mg 12 hr tablet Commonly known as: OXYCONTIN   pantoprazole 40 MG tablet Commonly known as: PROTONIX   POSACONAZOLE PO   pregabalin 150 MG capsule Commonly known as: LYRICA   topiramate 25 MG tablet Commonly known as: TOPAMAX   traZODone 100 MG tablet Commonly known as: DESYREL       Allergies  Allergen Reactions  . Ace Inhibitors     Other reaction(s): Cough  . Lisinopril Cough  . Tylenol [Acetaminophen] Hives and Itching  Pt. States she only itches from tylenol     Consultations:  Palliative care medicine  Medical oncology   Procedures/Studies: DG Shoulder  Right  Result Date: 04/16/2020 CLINICAL DATA:  Fall, pain, bruising EXAM: RIGHT SHOULDER - 2+ VIEW; RIGHT HUMERUS - 2+ VIEW; RIGHT FOREARM - 2 VIEW COMPARISON:  None. FINDINGS: No fracture or dislocation of the right shoulder. Mild acromioclavicular and glenohumeral arthrosis. The partially included right chest is unremarkable. No fracture or dislocation of the right humerus. No fracture or dislocation of the right radius or ulna. IMPRESSION: 1. No fracture or dislocation of the right shoulder. Mild acromioclavicular and glenohumeral arthrosis. 2. No fracture or dislocation of the right humerus. 3. No fracture or dislocation of the right radius or ulna. Electronically Signed   By: Eddie Candle M.D.   On: 04/16/2020 15:51   DG Forearm Right  Result Date: 04/16/2020 CLINICAL DATA:  Fall, pain, bruising EXAM: RIGHT SHOULDER - 2+ VIEW; RIGHT HUMERUS - 2+ VIEW; RIGHT FOREARM - 2 VIEW COMPARISON:  None. FINDINGS: No fracture or dislocation of the right shoulder. Mild acromioclavicular and glenohumeral arthrosis. The partially included right chest is unremarkable. No fracture or dislocation of the right humerus. No fracture or dislocation of the right radius or ulna. IMPRESSION: 1. No fracture or dislocation of the right shoulder. Mild acromioclavicular and glenohumeral arthrosis. 2. No fracture or dislocation of the right humerus. 3. No fracture or dislocation of the right radius or ulna. Electronically Signed   By: Eddie Candle M.D.   On: 04/16/2020 15:51   CT Head Wo Contrast  Result Date: 04/16/2020 CLINICAL DATA:  Fall, hematoma to right eyebrow EXAM: CT HEAD WITHOUT CONTRAST TECHNIQUE: Contiguous axial images were obtained from the base of the skull through the vertex without intravenous contrast. COMPARISON:  None. FINDINGS: Brain: No evidence of acute infarction, hemorrhage, hydrocephalus, extra-axial collection or mass lesion/mass effect. Vascular: No hyperdense vessel or unexpected calcification. Skull:  Normal. Negative for fracture or focal lesion. Sinuses/Orbits: No acute finding. Other: Soft tissue contusion of the right brow (series 3, image 5). IMPRESSION: 1. No acute intracranial pathology. 2. Soft tissue contusion of the right brow. Electronically Signed   By: Eddie Candle M.D.   On: 04/16/2020 15:06   DG Humerus Right  Result Date: 04/16/2020 CLINICAL DATA:  Fall, pain, bruising EXAM: RIGHT SHOULDER - 2+ VIEW; RIGHT HUMERUS - 2+ VIEW; RIGHT FOREARM - 2 VIEW COMPARISON:  None. FINDINGS: No fracture or dislocation of the right shoulder. Mild acromioclavicular and glenohumeral arthrosis. The partially included right chest is unremarkable. No fracture or dislocation of the right humerus. No fracture or dislocation of the right radius or ulna. IMPRESSION: 1. No fracture or dislocation of the right shoulder. Mild acromioclavicular and glenohumeral arthrosis. 2. No fracture or dislocation of the right humerus. 3. No fracture or dislocation of the right radius or ulna. Electronically Signed   By: Eddie Candle M.D.   On: 04/16/2020 15:51   US PELVIC COMPLETE WITH TRANSVAGINAL  Result Date: 04/23/2020 CLINICAL DATA:  Vaginal bleeding EXAM: TRANSABDOMINAL AND TRANSVAGINAL ULTRASOUND OF PELVIS TECHNIQUE: Both transabdominal and transvaginal ultrasound examinations of the pelvis were performed. Transabdominal technique was performed for global imaging of the pelvis including uterus, ovaries, adnexal regions, and pelvic cul-de-sac. It was necessary to proceed with endovaginal exam following the transabdominal exam to attempt to visualize the uterus endometrium ovaries. COMPARISON:  CT 09/09/2019 FINDINGS: Uterus Transvaginal images or markedly limited by patient pain tolerance and shadowing. Measurements: 8.9 x 4.5  x 5.8 cm = volume: 114.7 mL. Diffuse echogenic foci throughout the myometrium. Endometrium Thickness: 4.4 mm.  No focal abnormality visualized. Right ovary Measurements: 2.5 x 2.1 x 2 cm = volume: 5.4  mL. Normal appearance/no adnexal mass. Left ovary Measurements: 2.6 x 2.1 x 2.2 cm = volume: 6.3 mL. Normal appearance/no adnexal mass. Other findings No abnormal free fluid. IMPRESSION: 1. Limited study. Transvaginal images of limited utility due to pain. 2. Post menopausal endometrial thickness grossly within normal limits. 3. Heterogeneous myometrial echotexture with numerous echogenic foci indeterminate for microcalcifications from prior inflammation or infection versus vascular calcification versus echogenic foci associated with adenomyosis though no adequate transvaginal images are available. Depending upon clinical course, evaluation with pelvic MRI could be considered. Electronically Signed   By: Donavan Foil M.D.   On: 04/23/2020 19:01      Subjective: Patient states she is thirst. Patient is in pain. Patient voices rhetoric consistent with full comfort measures, stating that she just wants to rest; this was confirmed with her as meaning she is ready to die.   Discharge Exam: Vitals:   05/01/20 0500 05/01/20 1255  BP: (!) 154/66 (!) 145/62  Pulse: 89 84  Resp: 20 18  Temp: 98.5 F (36.9 C) 98.8 F (37.1 C)  SpO2: 100% 97%   Vitals:   04/30/20 1547 04/30/20 2022 05/01/20 0500 05/01/20 1255  BP:  (!) 148/64 (!) 154/66 (!) 145/62  Pulse: 84 87 89 84  Resp: 16 16 20 18   Temp: 99 F (37.2 C) 98.6 F (37 C) 98.5 F (36.9 C) 98.8 F (37.1 C)  TempSrc: Oral Oral Oral Oral  SpO2: 97% 95% 100% 97%  Weight:      Height:        General exam: Appears calm and slightly uncomfortable HEENT: oropharynx with dried blood. Lips with dried blood Respiratory system: Clear to auscultation. Respiratory effort normal. Cardiovascular system: S1 & S2 heard, RRR. No murmurs, rubs, gallops or clicks. Gastrointestinal system: Abdomen is nondistended, soft and nontender. Decreased bowel sounds heard. Central nervous system: Alert and oriented. No focal neurological deficits. Musculoskeletal: BLE  edema. Psychiatry: Depressed mood. Flat affect   The results of significant diagnostics from this hospitalization (including imaging, microbiology, ancillary and laboratory) are listed below for reference.     Labs:  Basic Metabolic Panel: Recent Labs  Lab 04/25/20 0606 04/26/20 0546 04/27/20 0451 04/28/20 0616 04/29/20 0514 04/30/20 0615  NA 140 144 139 140 140 144  K 3.6 3.9 3.8 3.2* 3.5 3.3*  CL 102 105 103 103 103 105  CO2 26 30 28 28 29 30   GLUCOSE 140* 99 100* 101* 100* 111*  BUN <5* <5* 7* 6* 6* 7*  CREATININE 0.72 0.73 0.83 0.84 0.91 0.86  CALCIUM 8.9 9.0 8.7* 9.1 8.9 9.5  MG 1.8 1.9 1.8 1.8 1.9  --   PHOS 2.7 3.1 3.7 3.4 3.1  --    Liver Function Tests: Recent Labs  Lab 04/26/20 0546 04/27/20 0451 04/28/20 0616 04/29/20 0514 04/30/20 0615  AST 54* 64* 69* 85* 100*  ALT 15 20 20 26 27   ALKPHOS 185* 210* 242* 308* 376*  BILITOT 0.5 0.9 0.9 1.0 1.3*  PROT 5.0* 5.3* 6.0* 5.2* 5.6*  ALBUMIN 2.6* 2.9* 3.4* 2.9* 3.2*   CBC: Recent Labs  Lab 04/25/20 0606 04/26/20 0546 04/27/20 0451 04/28/20 0616 04/29/20 0514 04/30/20 0615 05/01/20 0543  WBC 5.1   < > 9.5 11.0* 14.7* 19.6* 17.7*  NEUTROABS 0.3*  --  4.5  0.2* 0.0*  --   --   HGB 8.3*   < > 9.2* 9.0* 7.7* 9.3* 7.3*  HCT 25.4*   < > 27.7* 27.5* 22.6* 27.1* 22.5*  MCV 95.5   < > 93.0 92.6 89.7 89.1 91.8  PLT 20*   < > 17* 12* 8* 13* 7*   < > = values in this interval not displayed.   Urinalysis    Component Value Date/Time   COLORURINE YELLOW 09/10/2019 1202   APPEARANCEUR CLEAR 09/10/2019 1202   LABSPEC 1.027 09/10/2019 1202   PHURINE 5.0 09/10/2019 1202   GLUCOSEU 50 (A) 09/10/2019 1202   HGBUR LARGE (A) 09/10/2019 1202   BILIRUBINUR NEGATIVE 09/10/2019 1202   KETONESUR NEGATIVE 09/10/2019 1202   PROTEINUR NEGATIVE 09/10/2019 1202   NITRITE NEGATIVE 09/10/2019 1202   LEUKOCYTESUR NEGATIVE 09/10/2019 1202    Time coordinating discharge: 35 minutes  SIGNED:   Cordelia Poche, MD [facility] 05/01/2020, 3:53 PM

## 2020-05-01 NOTE — Progress Notes (Signed)
Daily Progress Note   Patient Name: Kelly Roberson       Date: 05/01/2020 DOB: 1960-06-29  Age: 60 y.o. MRN#: [id number] Attending Physician: Mariel Aloe, MD Primary Care Physician: Kristie Cowman, MD Admit Date: 04/20/2020  Reason for Consultation/Follow-up: Establishing goals of care  Subjective: Patient is resting in bed, did not rest well overnight, ongoing generalized pain, pain in her R hip area, patient is worried that she might have fractured her hip area. She has dried blood on her lips, blood in her oral cavity and both nares. Also discussed with med onc colleague Erasmo Downer NP.      Length of Stay: 11  Current Medications: Scheduled Meds:  . acyclovir  400 mg Oral BID  . escitalopram  5 mg Oral Daily  . feeding supplement  237 mL Oral TID BM  . levofloxacin  500 mg Oral Daily  . oxyCODONE  20 mg Oral Q12H  . posaconazole  300 mg Oral Daily  . pregabalin  150 mg Oral BID  . senna  1 tablet Oral QHS  . terbinafine   Topical BID  . traZODone  50 mg Oral QHS    Continuous Infusions:   PRN Meds: ALPRAZolam, HYDROmorphone (DILAUDID) injection, ondansetron (ZOFRAN) IV, oxyCODONE  Physical Exam         Appears weaker, resting in bed.  Has lower extremity edema Regular work of breathing Dried blood on lips nares and in oral cavity S1-S2 Abdomen is distended  Vital Signs: BP (!) 154/66 (BP Location: Left Arm)   Pulse 89   Temp 98.5 F (36.9 C) (Oral)   Resp 20   Ht 5' 7.99" (1.727 m)   Wt 84 kg   SpO2 100%   BMI 28.16 kg/m  SpO2: SpO2: 100 % O2 Device: O2 Device: Room Air O2 Flow Rate: O2 Flow Rate (L/min): 3 L/min  Intake/output summary:   Intake/Output Summary (Last 24 hours) at 05/01/2020 0913 Last data filed at 05/01/2020 0600 Gross per 24 hour  Intake  480 ml  Output 500 ml  Net -20 ml   LBM: Last BM Date: 04/29/20 Baseline Weight: Weight: 84 kg Most recent weight: Weight: 84 kg      Palliative performance scale 30%. Palliative Assessment/Data:      Patient Active Problem List   Diagnosis Date Noted  .  Palliative care by specialist   . Goals of care, counseling/discussion   . Generalized pain   . Rash and nonspecific skin eruption 04/23/2020  . Malnutrition of moderate degree 04/21/2020  . Symptomatic anemia 04/20/2020  . Intractable headache 02/14/2020  . RLS (restless legs syndrome) 01/09/2020  . Neutropenic fever ([place])   . Thrombocytopenia ([place])   . Sepsis ([place]) 09/10/2019  . Severe sepsis ([place]) 09/09/2019  . Typhlitis   . Colitis 07/29/2019  . Hypokalemia 07/29/2019  . Pancytopenia ([place]) 07/29/2019  . Antineoplastic chemotherapy induced pancytopenia ([place]) 07/01/2019  . Pancytopenia due to antineoplastic chemotherapy ([place]) 07/01/2019  . Headache 07/01/2019  . Neutropenia ([place]) 07/01/2019  . Macrocytic anemia 05/01/2019  . AML (acute myeloblastic leukemia) ([place]) 05/01/2019  . Neck pain on right side 05/01/2019  . Leukocytosis 05/01/2019  . Bicytopenia 04/30/2019  . Macrocytosis without anemia 02/12/2019  . Family history of hemochromatosis 02/12/2019  . Mixed hyperlipidemia 01/18/2019  . Abnormal LFTs 01/18/2019  . Dandruff in adult 01/18/2019  . Hair loss 01/18/2019  . Muscle weakness 01/18/2019  . Peripheral neuropathy 01/18/2019  . Other fatigue 01/18/2019  . Essential hypertension 06/01/2018  . Palpitations 06/01/2018  . Anxiety disorder 06/01/2018  . Insomnia 06/01/2018  . Irritable bowel syndrome with diarrhea 06/01/2018  . Barrett's esophagus with dysplasia 06/01/2018    Palliative Care Assessment & Plan   Patient Profile:  Kelly Roberson a 60 y.o.femalewith medical history significant ofAcute Myeloid leukemia having been admitted to [facility] for the last 60 days. Was discharged home for 24h  before she presented to oncology for further evaluation for ongoing weakness. She declined admission to hospital at that point but now presents again to oncology for ongoing weakness who is requesting admission to the hospital. Comorbid conditions include - HTN, Anxiety, Hepatitis B/C, IBS, RLS.    Patient remains admitted to hospital medicine service and medical oncology is following.  Patient has also been visited by spiritual care.  Palliative medicine consultation for ongoing goals of care discussions has been requested.  Patient has medical history significant for AML has been admitted for worsening weakness and severe cytopenias.  At this stage, her cancer is found to be rapidly progressing and her blood counts are not able to be kept above transfusion threshold as per oncology.  She follows with [facility] as well.  Assessment: Palliative services following for CODE STATUS discussions, goals of care discussions, symptom management as well as discussions for arriving at most suitable disposition option for this patient. Functional decline Generalized pain   Recommendations/Plan: DNR DNI  Residential hospice options being explored.  Prognosis likely less than 2 weeks in my opinion.  Continue current pain and non pain symptom management medications.  Goals of care discussions again held with patient: we talked about her current condition, ongoing decline, high likelihood of ongoing decompensation and how hospice services can help at this stage. All of her concerns addressed to the best of my ability.  Patient might likely need scheduled opioids or even an opioid infusion in the near future. Patient states that both her sons might be arriving today.       Code Status: now DNR DNI.     Code Status Orders  (From admission, onward)         Start     Ordered   04/20/20 1612  Full code  Continuous        04/20/20 1612        Code Status History  Date Active Date  Inactive Code Status Order ID Comments User Context   09/10/2019 0258 09/18/2019 1934 Full Code [id number]  Collier Bullock, MD ED   09/10/2019 0041 09/10/2019 0258 Full Code [id number]  Shela Leff, MD ED   07/29/2019 2322 08/03/2019 2010 Full Code [id number]  Shela Leff, MD ED   07/01/2019 0413 07/03/2019 2059 Full Code [id number]  Neena Rhymes, MD ED   04/30/2019 2309 05/02/2019 0342 Full Code [id number]  Elwyn Reach, MD Inpatient   Advance Care Planning Activity       Prognosis:    2weeks Could be much shorter, patient has high risk for ongoing decline and decompensation given her terminal illness.   Discharge Planning:  Hospice facility Care plan was discussed with patient and oncology   Thank you for allowing the Palliative Medicine Team to assist in the care of this patient.   Time In: 8.30 Time Out: 9.05 Total Time 35 Prolonged Time Billed No       Greater than 50%  of this time was spent counseling and coordinating care related to the above assessment and plan.  Loistine Chance, MD  Please contact Palliative Medicine Team phone at [phone] for questions and concerns.

## 2020-05-01 NOTE — TOC Transition Note (Signed)
Transition of Care [facility]) - CM/SW Discharge Note   Patient Details  Name: Zariana Strub MRN: [id number] Date of Birth: 08-11-1960  [facility] CM/SW Contact:  Lynnell Catalan, RN Phone Number: 05/01/2020, 3:05 PM   Clinical Narrative:    Spoke with pt and son Erlene Quan at bedside. They state that they have decided that pt is not well enough to transport to any other city for residential hospice at this time. They agree to residential hospice in [place]. [facility] [place] contacted to inquire about residential bed availability at [facility]. Pt has been accepted there and will transport today by [facility]. RN to call report to [phone]. Yellow DNR on chart for transport.   Final next level of care: [place] Barriers to Discharge: Continued Medical Work up  Discharge Placement        Patient to be transferred to facility by: [place] Name of family member notified: son Erlene Quan

## 2020-05-01 NOTE — Progress Notes (Signed)
PMT additional progress note  Discussed with [facility] MD colleague Dr. Lonny Prude as well as have been in communication with [facility] colleague Alinda Sierras.  Arrived at the bedside again this afternoon.  Met with patient Kelly Roberson and daughter-in-law at the bedside.  Palliative medicine is specialized medical care for people living with serious illness. It focuses on providing relief from the symptoms and stress of a serious illness. The goal is to improve quality of life for both the patient and the family.  Goals of care: Broad aims of medical therapy in relation to the patient's values and preferences. Our aim is to provide medical care aimed at enabling patients to achieve the goals that matter most to them, given the circumstances of their particular medical situation and their constraints.   Introductions were made.  We reviewed the patient's past medical history as well as scope of current hospitalization.  We discussed about differences between palliative care and hospice.  Goals wishes and values attempted to be explored.  Pending on pain symptom management options explored.  Discussed in detail about hospice philosophy of care.  All of the patient's and son's questions addressed to the best of my ability.  Plan: Full scope of comfort measures Low-dose with continuous Dilaudid infusion Local residential hospice when bed available   Prognosis less than 2 weeks. 35 minutes spent. Time in 12 Time out 12.35   Greater than 50%  of this time was spent counseling and coordinating care related to the above assessment and plan.  Loistine Chance MD [place] palliative

## 2020-05-02 NOTE — Progress Notes (Signed)
[facility] arrived to pick up patient to transfer to [facility] in [place]. Due to time of night, will call son's in the morning around 7am. Family had visited patient yesterday 4/29 and were aware of her transfer to [place].

## 2020-05-02 NOTE — Progress Notes (Signed)
Approx. 58mL wasted from Dilaudid drip into Stericycle with Carmin Richmond RN.

## 2020-05-14 ENCOUNTER — Ambulatory Visit: Payer: Medicaid Other | Admitting: Neurology

## 2020-06-03 DEATH — deceased

## 2022-03-22 IMAGING — CT CT ANGIO NECK
1 of 10 series · 6 of 33 positions shown · IV contrast (omnipaque)
Comparison: Head CT performed 1 day prior 07/01/2019

CLINICAL DATA: Headache, acute, normal neuro exam. Additional
provided: Headache radiates down both arms and into neck with
numbness/weakness in both legs.

EXAM:
CT ANGIOGRAPHY HEAD AND NECK
TECHNIQUE: Multidetector CT imaging of the head and neck was performed using
the standard protocol during bolus administration of intravenous
contrast. Multiplanar CT image reconstructions and MIPs were
obtained to evaluate the vascular anatomy. Carotid stenosis
measurements (when applicable) are obtained utilizing NASCET
criteria, using the distal internal carotid diameter as the
denominator.
CONTRAST:  100mL OMNIPAQUE IOHEXOL 350 MG/ML SOLN

[Series 13: ax thin · axial · 0.39mm/px · z∈[-291,-31]mm · 6 of 364 slices shown]
[im 52/364  soft-tissue]
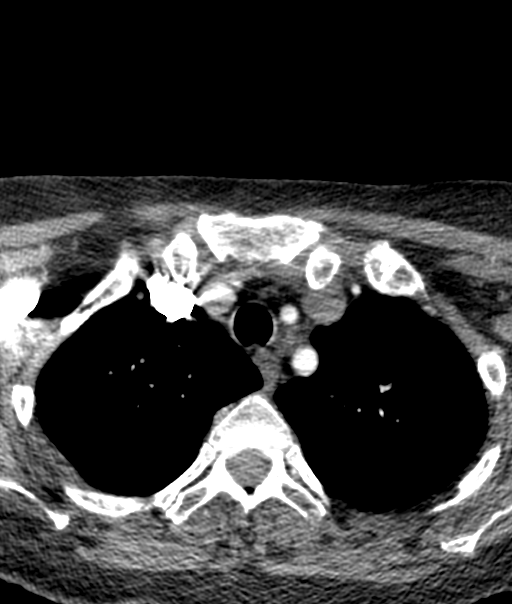
[im 104/364  bone]
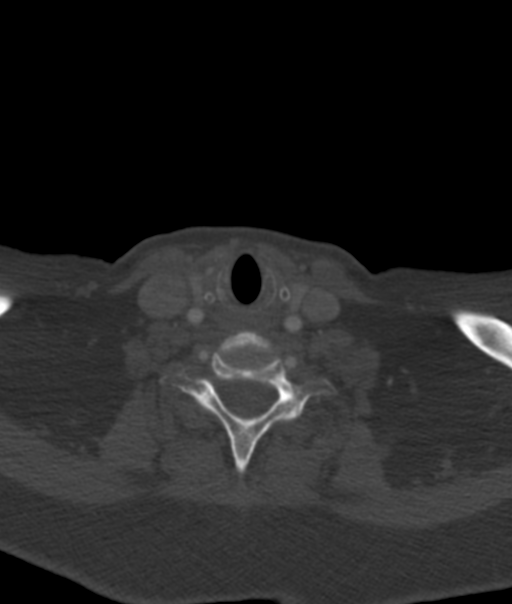
[im 156/364  soft-tissue]
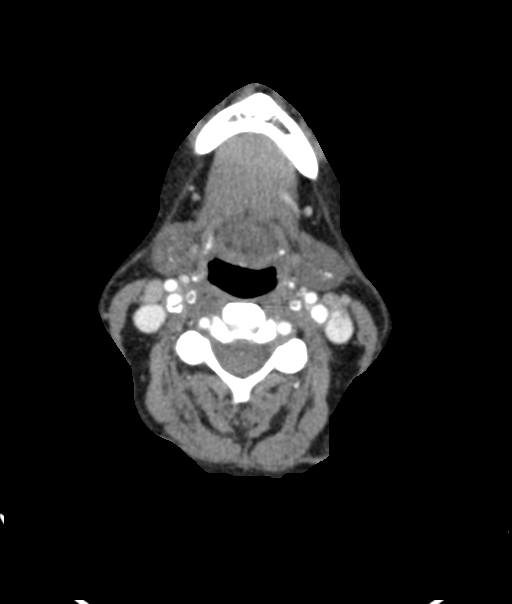
[im 208/364  bone]
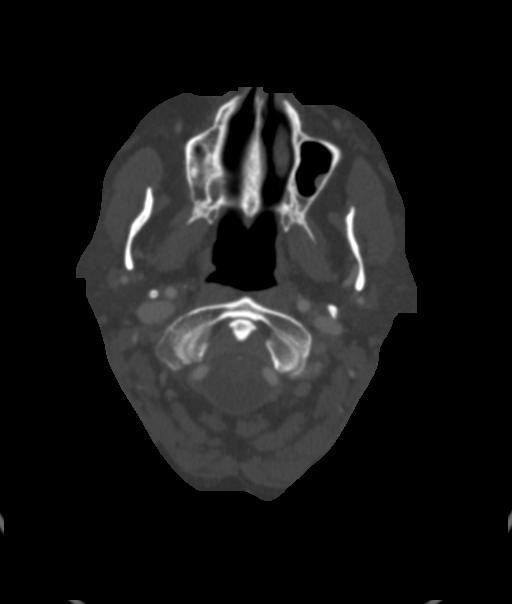
[im 260/364  soft-tissue]
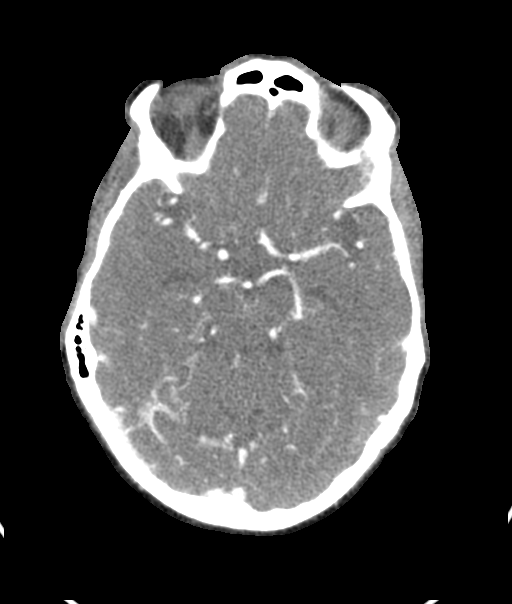
[im 312/364  bone]
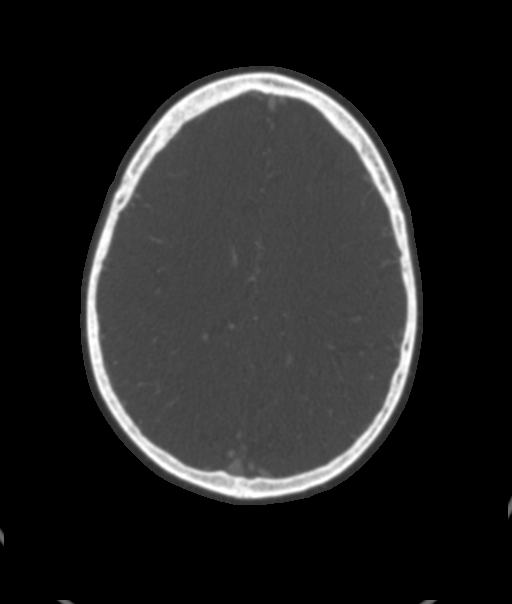

[6 of 33 positions shown; findings below may reference images not displayed]

FINDINGS: CT HEAD FINDINGS

Brain:

Stable, mild generalized parenchymal atrophy.

Redemonstrated small chronic lacunar infarct within the anterior
limb of right internal capsule/right caudate head.

There is no acute intracranial hemorrhage.

No demarcated cortical infarct.

No extra-axial fluid collection.

No evidence of intracranial mass.

No midline shift.

Vascular: Reported below.

Skull: Normal. Negative for fracture or focal lesion.

Sinuses: Tiny left maxillary sinus mucous retention cyst. No
significant mastoid effusion

Orbits: No acute abnormality.

Review of the MIP images confirms the above findings

CTA NECK FINDINGS

Aortic arch: Standard aortic branching. Mild calcified plaque within
the visualized aortic arch. Streak artifact from a dense right-sided
contrast bolus limits evaluation of the mid to distal right
subclavian artery. No hemodynamically significant innominate or
proximal subclavian artery stenosis.

Right carotid system: CCA and ICA patent within the neck without
significant stenosis (50% or greater). Mild mixed plaque within the
carotid bifurcation and proximal ICA.

Left carotid system: CCA and ICA patent within the neck without
significant stenosis (50% or greater). Minimal mixed plaque within
the proximal ICA.

Vertebral arteries: The vertebral arteries are patent within the
neck bilaterally without significant stenosis. The left vertebral
artery is slightly dominant.

Skeleton: No acute bony abnormality or aggressive osseous lesion.

Other neck: No neck mass or cervical lymphadenopathy. Thyroid
unremarkable.

Upper chest: 8 mm ground-glass nodule within the left upper lobe
(series 13, image 331).

Review of the MIP images confirms the above findings

CTA HEAD FINDINGS

Anterior circulation:

The intracranial internal carotid arteries are patent without
significant stenosis.

The M1 middle cerebral arteries are patent without significant
stenosis. No M2 proximal branch occlusion or high-grade proximal
stenosis is identified.

The anterior cerebral arteries are patent without significant
proximal stenosis.

There is a 1-2 mm vascular protrusion arising from the supraclinoid
right ICA which may reflect a tiny aneurysm or infundibulum (series
15, image 92). A 1-2 mm vascular protrusion arising from the
supraclinoid left ICA appears to reflect a small infundibulum
(series 15, image 113).

Posterior circulation:

The intracranial vertebral arteries are patent without significant
stenosis, as is the basilar artery. The posterior cerebral arteries
are patent bilaterally without significant proximal stenosis
posterior communicating arteries are hypoplastic or absent
bilaterally.

Venous sinuses: Within limitations of contrast timing, no convincing
thrombus.

Anatomic variants: As described

Review of the MIP images confirms the above findings
IMPRESSION: CT head:

1. No CT evidence of acute intracranial abnormality.
2. Stable mild generalized parenchymal atrophy.
3. Redemonstrated small chronic lacunar infarct within the anterior
limb of right internal capsule/right caudate head.
4. Tiny left maxillary sinus mucous retention cyst.

CTA neck:

1. The common carotid, internal carotid and vertebral arteries are
patent within the neck without hemodynamically significant stenosis.
Mild mixed plaque within the right carotid bifurcation, proximal
right ICA and proximal left ICA. There is also minimal calcified
plaque within the visualized aortic arch.
2. 8 mm ground-glass nodule within the imaged left lung apex.
Initial follow-up with CT at 6-12 months is recommended to confirm
persistence. If persistent, repeat CT is recommended every 2 years
until 5 years of stability has been established. This recommendation
follows the consensus statement: Guidelines for Management of
Incidental Pulmonary Nodules Detected on CT Images: From the

CTA head:

1. No intracranial large vessel occlusion or proximal high-grade
arterial stenosis.
2. 1-2 mm infundibulum versus tiny aneurysm arising from the
supraclinoid right ICA.
